# Patient Record
Sex: Female | Born: 1935 | Race: Black or African American | Hispanic: No | Smoking: Current every day smoker
Health system: Southern US, Community
[De-identification: ages and names within clinical notes are randomized; demographics above are authoritative.]

## PROBLEM LIST (undated history)

## (undated) DIAGNOSIS — I10 Essential (primary) hypertension: Secondary | ICD-10-CM

## (undated) DIAGNOSIS — F1910 Other psychoactive substance abuse, uncomplicated: Secondary | ICD-10-CM

## (undated) DIAGNOSIS — G629 Polyneuropathy, unspecified: Secondary | ICD-10-CM

## (undated) DIAGNOSIS — J449 Chronic obstructive pulmonary disease, unspecified: Secondary | ICD-10-CM

## (undated) DIAGNOSIS — D759 Disease of blood and blood-forming organs, unspecified: Secondary | ICD-10-CM

## (undated) DIAGNOSIS — IMO0001 Reserved for inherently not codable concepts without codable children: Secondary | ICD-10-CM

## (undated) DIAGNOSIS — Z5189 Encounter for other specified aftercare: Secondary | ICD-10-CM

## (undated) DIAGNOSIS — Z9289 Personal history of other medical treatment: Secondary | ICD-10-CM

## (undated) DIAGNOSIS — F0390 Unspecified dementia without behavioral disturbance: Secondary | ICD-10-CM

## (undated) DIAGNOSIS — R011 Cardiac murmur, unspecified: Secondary | ICD-10-CM

## (undated) DIAGNOSIS — E039 Hypothyroidism, unspecified: Secondary | ICD-10-CM

## (undated) DIAGNOSIS — N390 Urinary tract infection, site not specified: Secondary | ICD-10-CM

## (undated) DIAGNOSIS — C029 Malignant neoplasm of tongue, unspecified: Secondary | ICD-10-CM

## (undated) DIAGNOSIS — I071 Rheumatic tricuspid insufficiency: Secondary | ICD-10-CM

## (undated) HISTORY — PX: ABDOMINAL HYSTERECTOMY: SHX81

## (undated) HISTORY — PX: COLONOSCOPY: SHX5424

## (undated) HISTORY — PX: EYE SURGERY: SHX253

## (undated) HISTORY — DX: Other psychoactive substance abuse, uncomplicated: F19.10

## (undated) HISTORY — PX: TONSILLECTOMY AND ADENOIDECTOMY: SUR1326

## (undated) HISTORY — PX: BREAST SURGERY: SHX581

---

## 2000-08-11 ENCOUNTER — Ambulatory Visit (HOSPITAL_COMMUNITY): Admission: RE | Admit: 2000-08-11 | Discharge: 2000-08-11 | Payer: Self-pay | Admitting: Cardiology

## 2000-08-11 ENCOUNTER — Encounter: Payer: Self-pay | Admitting: Cardiology

## 2000-08-13 ENCOUNTER — Encounter: Payer: Self-pay | Admitting: Cardiology

## 2000-08-13 ENCOUNTER — Encounter: Admission: RE | Admit: 2000-08-13 | Discharge: 2000-08-13 | Payer: Self-pay | Admitting: Cardiology

## 2000-10-26 ENCOUNTER — Encounter (INDEPENDENT_AMBULATORY_CARE_PROVIDER_SITE_OTHER): Payer: Self-pay

## 2000-10-26 ENCOUNTER — Other Ambulatory Visit: Admission: RE | Admit: 2000-10-26 | Discharge: 2000-10-26 | Payer: Self-pay | Admitting: Internal Medicine

## 2000-10-27 ENCOUNTER — Encounter: Payer: Self-pay | Admitting: Internal Medicine

## 2000-10-27 ENCOUNTER — Ambulatory Visit (HOSPITAL_COMMUNITY): Admission: RE | Admit: 2000-10-27 | Discharge: 2000-10-27 | Payer: Self-pay | Admitting: Internal Medicine

## 2003-01-29 ENCOUNTER — Encounter: Admission: RE | Admit: 2003-01-29 | Discharge: 2003-01-29 | Payer: Self-pay | Admitting: Cardiology

## 2003-01-29 ENCOUNTER — Encounter: Payer: Self-pay | Admitting: Cardiology

## 2003-02-02 ENCOUNTER — Encounter: Payer: Self-pay | Admitting: Cardiology

## 2003-02-02 ENCOUNTER — Encounter: Admission: RE | Admit: 2003-02-02 | Discharge: 2003-02-02 | Payer: Self-pay | Admitting: Cardiology

## 2004-01-14 ENCOUNTER — Ambulatory Visit (HOSPITAL_COMMUNITY): Admission: RE | Admit: 2004-01-14 | Discharge: 2004-01-14 | Payer: Self-pay | Admitting: Internal Medicine

## 2004-01-22 ENCOUNTER — Encounter (INDEPENDENT_AMBULATORY_CARE_PROVIDER_SITE_OTHER): Payer: Self-pay | Admitting: Specialist

## 2004-01-22 ENCOUNTER — Ambulatory Visit (HOSPITAL_COMMUNITY): Admission: RE | Admit: 2004-01-22 | Discharge: 2004-01-22 | Payer: Self-pay | Admitting: Internal Medicine

## 2004-05-08 ENCOUNTER — Encounter: Admission: RE | Admit: 2004-05-08 | Discharge: 2004-05-08 | Payer: Self-pay | Admitting: Cardiology

## 2004-07-22 ENCOUNTER — Encounter: Admission: RE | Admit: 2004-07-22 | Discharge: 2004-07-22 | Payer: Self-pay | Admitting: Cardiology

## 2005-06-19 ENCOUNTER — Encounter: Admission: RE | Admit: 2005-06-19 | Discharge: 2005-06-19 | Payer: Self-pay | Admitting: Cardiology

## 2005-10-02 ENCOUNTER — Ambulatory Visit (HOSPITAL_COMMUNITY): Admission: RE | Admit: 2005-10-02 | Discharge: 2005-10-02 | Payer: Self-pay | Admitting: Internal Medicine

## 2005-10-06 ENCOUNTER — Ambulatory Visit (HOSPITAL_COMMUNITY): Admission: RE | Admit: 2005-10-06 | Discharge: 2005-10-06 | Payer: Self-pay | Admitting: Cardiology

## 2005-10-06 ENCOUNTER — Encounter (INDEPENDENT_AMBULATORY_CARE_PROVIDER_SITE_OTHER): Payer: Self-pay | Admitting: Cardiology

## 2005-12-10 ENCOUNTER — Ambulatory Visit (HOSPITAL_COMMUNITY): Admission: RE | Admit: 2005-12-10 | Discharge: 2005-12-10 | Payer: Self-pay | Admitting: Internal Medicine

## 2007-04-22 ENCOUNTER — Ambulatory Visit (HOSPITAL_COMMUNITY): Admission: RE | Admit: 2007-04-22 | Discharge: 2007-04-22 | Payer: Self-pay | Admitting: Cardiology

## 2008-02-13 ENCOUNTER — Encounter: Admission: RE | Admit: 2008-02-13 | Discharge: 2008-02-13 | Payer: Self-pay | Admitting: Cardiology

## 2008-05-28 ENCOUNTER — Ambulatory Visit (HOSPITAL_COMMUNITY): Admission: RE | Admit: 2008-05-28 | Discharge: 2008-05-28 | Payer: Self-pay | Admitting: Internal Medicine

## 2008-11-26 ENCOUNTER — Encounter: Admission: RE | Admit: 2008-11-26 | Discharge: 2008-11-26 | Payer: Self-pay | Admitting: Cardiology

## 2010-02-07 ENCOUNTER — Emergency Department (HOSPITAL_COMMUNITY): Admission: EM | Admit: 2010-02-07 | Discharge: 2010-02-07 | Payer: Self-pay | Admitting: Emergency Medicine

## 2010-06-22 DIAGNOSIS — I071 Rheumatic tricuspid insufficiency: Secondary | ICD-10-CM

## 2010-06-22 HISTORY — DX: Rheumatic tricuspid insufficiency: I07.1

## 2010-07-13 ENCOUNTER — Encounter: Payer: Self-pay | Admitting: Cardiology

## 2010-09-05 LAB — DIFFERENTIAL
Basophils Absolute: 0 10*3/uL (ref 0.0–0.1)
Basophils Relative: 1 % (ref 0–1)
Eosinophils Relative: 0 % (ref 0–5)
Monocytes Absolute: 0.4 10*3/uL (ref 0.1–1.0)
Neutrophils Relative %: 53 % (ref 43–77)

## 2010-09-05 LAB — BASIC METABOLIC PANEL
CO2: 28 mEq/L (ref 19–32)
Calcium: 8.8 mg/dL (ref 8.4–10.5)
Chloride: 101 mEq/L (ref 96–112)
GFR calc Af Amer: >60 mL/min (ref >60)
GFR calc non Af Amer: >60 mL/min (ref >60)
Potassium: 2.9 mEq/L — ABNORMAL LOW (ref 3.5–5.1)
Sodium: 146 mEq/L — ABNORMAL HIGH (ref 135–145)

## 2010-09-05 LAB — CBC
HCT: 38.4 % (ref 36.0–46.0)
Hemoglobin: 13.2 g/dL (ref 12.0–15.0)
MCH: 36.2 pg — ABNORMAL HIGH (ref 26.0–34.0)
MCV: 105.4 fL — ABNORMAL HIGH (ref 78.0–100.0)
Platelets: 174 10*3/uL (ref 150–400)
WBC: 6.5 10*3/uL (ref 4.0–10.5)

## 2010-09-05 LAB — URINALYSIS, ROUTINE W REFLEX MICROSCOPIC
Nitrite: POSITIVE — AB
Protein, ur: 30 mg/dL — AB
pH: 6 (ref 5.0–8.0)

## 2010-09-05 LAB — POCT CARDIAC MARKERS
CKMB, poc: 2.4 ng/mL (ref 1.0–8.0)
Myoglobin, poc: 174 ng/mL (ref 12–200)
Troponin i, poc: <0.05 ng/mL (ref 0.00–0.09)

## 2010-09-05 LAB — URINE MICROSCOPIC-ADD ON

## 2010-10-07 ENCOUNTER — Emergency Department (HOSPITAL_COMMUNITY)
Admission: EM | Admit: 2010-10-07 | Discharge: 2010-10-07 | Disposition: A | Discharge status: Home / Self Care | Payer: Medicare Other | Attending: Emergency Medicine | Admitting: Emergency Medicine

## 2010-10-07 ENCOUNTER — Emergency Department (HOSPITAL_COMMUNITY): Payer: Medicare Other

## 2010-10-07 DIAGNOSIS — J449 Chronic obstructive pulmonary disease, unspecified: Secondary | ICD-10-CM | POA: Insufficient documentation

## 2010-10-07 DIAGNOSIS — E119 Type 2 diabetes mellitus without complications: Secondary | ICD-10-CM | POA: Insufficient documentation

## 2010-10-07 DIAGNOSIS — E7800 Pure hypercholesterolemia, unspecified: Secondary | ICD-10-CM | POA: Insufficient documentation

## 2010-10-07 DIAGNOSIS — R0602 Shortness of breath: Secondary | ICD-10-CM | POA: Insufficient documentation

## 2010-10-07 DIAGNOSIS — R05 Cough: Secondary | ICD-10-CM | POA: Insufficient documentation

## 2010-10-07 DIAGNOSIS — E039 Hypothyroidism, unspecified: Secondary | ICD-10-CM | POA: Insufficient documentation

## 2010-10-07 DIAGNOSIS — R0989 Other specified symptoms and signs involving the circulatory and respiratory systems: Secondary | ICD-10-CM | POA: Insufficient documentation

## 2010-10-07 DIAGNOSIS — J4489 Other specified chronic obstructive pulmonary disease: Secondary | ICD-10-CM | POA: Insufficient documentation

## 2010-10-07 DIAGNOSIS — R0902 Hypoxemia: Secondary | ICD-10-CM | POA: Insufficient documentation

## 2010-10-07 DIAGNOSIS — R059 Cough, unspecified: Secondary | ICD-10-CM | POA: Insufficient documentation

## 2010-10-07 DIAGNOSIS — I10 Essential (primary) hypertension: Secondary | ICD-10-CM | POA: Insufficient documentation

## 2010-11-07 ENCOUNTER — Inpatient Hospital Stay (HOSPITAL_COMMUNITY)
Admission: AD | Admit: 2010-11-07 | Discharge: 2010-11-10 | DRG: 392 | Disposition: A | Discharge status: Home / Self Care | Payer: Medicare Other | Source: Ambulatory Visit | Attending: Internal Medicine | Admitting: Internal Medicine

## 2010-11-07 DIAGNOSIS — N179 Acute kidney failure, unspecified: Secondary | ICD-10-CM | POA: Diagnosis present

## 2010-11-07 DIAGNOSIS — F17200 Nicotine dependence, unspecified, uncomplicated: Secondary | ICD-10-CM | POA: Diagnosis present

## 2010-11-07 DIAGNOSIS — E039 Hypothyroidism, unspecified: Secondary | ICD-10-CM | POA: Diagnosis present

## 2010-11-07 DIAGNOSIS — E876 Hypokalemia: Secondary | ICD-10-CM | POA: Diagnosis present

## 2010-11-07 DIAGNOSIS — K219 Gastro-esophageal reflux disease without esophagitis: Secondary | ICD-10-CM | POA: Diagnosis present

## 2010-11-07 DIAGNOSIS — M1990 Unspecified osteoarthritis, unspecified site: Secondary | ICD-10-CM | POA: Diagnosis present

## 2010-11-07 DIAGNOSIS — I10 Essential (primary) hypertension: Secondary | ICD-10-CM | POA: Diagnosis present

## 2010-11-07 DIAGNOSIS — R5381 Other malaise: Secondary | ICD-10-CM | POA: Diagnosis present

## 2010-11-07 DIAGNOSIS — E119 Type 2 diabetes mellitus without complications: Secondary | ICD-10-CM | POA: Diagnosis present

## 2010-11-07 DIAGNOSIS — M810 Age-related osteoporosis without current pathological fracture: Secondary | ICD-10-CM | POA: Diagnosis present

## 2010-11-07 DIAGNOSIS — R627 Adult failure to thrive: Secondary | ICD-10-CM | POA: Diagnosis present

## 2010-11-07 DIAGNOSIS — K224 Dyskinesia of esophagus: Principal | ICD-10-CM | POA: Diagnosis present

## 2010-11-07 LAB — DIFFERENTIAL
Eosinophils Relative: 0 % (ref 0–5)
Lymphs Abs: 1.9 10*3/uL (ref 0.7–4.0)
Monocytes Absolute: 0.5 10*3/uL (ref 0.1–1.0)
Neutro Abs: 4.9 10*3/uL (ref 1.7–7.7)

## 2010-11-07 LAB — CBC
RBC: 3.67 MIL/uL — ABNORMAL LOW (ref 3.87–5.11)
RDW: 12.6 % (ref 11.5–15.5)

## 2010-11-07 LAB — LIPID PANEL
Cholesterol: 166 mg/dL (ref 0–200)
HDL: 40 mg/dL (ref >39)
LDL Cholesterol: 100 mg/dL — ABNORMAL HIGH (ref 0–99)
Total CHOL/HDL Ratio: 4.2 RATIO
Triglycerides: 131 mg/dL (ref <150)

## 2010-11-07 LAB — CARDIAC PANEL(CRET KIN+CKTOT+MB+TROPI)
CK, MB: 1.8 ng/mL (ref 0.3–4.0)
Troponin I: <0.3 ng/mL (ref <0.30)

## 2010-11-07 LAB — GLUCOSE, CAPILLARY

## 2010-11-08 ENCOUNTER — Inpatient Hospital Stay (HOSPITAL_COMMUNITY): Payer: Medicare Other

## 2010-11-08 DIAGNOSIS — R1310 Dysphagia, unspecified: Secondary | ICD-10-CM

## 2010-11-08 LAB — CARDIAC PANEL(CRET KIN+CKTOT+MB+TROPI)
CK, MB: 1.6 ng/mL (ref 0.3–4.0)
Relative Index: INVALID (ref 0.0–2.5)
Total CK: 45 U/L (ref 7–177)

## 2010-11-08 LAB — COMPREHENSIVE METABOLIC PANEL
ALT: 31 U/L (ref 0–35)
ALT: 26 U/L (ref 0–35)
AST: 71 U/L — ABNORMAL HIGH (ref 0–37)
Albumin: 3.3 g/dL — ABNORMAL LOW (ref 3.5–5.2)
Albumin: 2.6 g/dL — ABNORMAL LOW (ref 3.5–5.2)
Alkaline Phosphatase: 88 U/L (ref 39–117)
Alkaline Phosphatase: 73 U/L (ref 39–117)
BUN: 35 mg/dL — ABNORMAL HIGH (ref 6–23)
CO2: 31 mEq/L (ref 19–32)
Calcium: 8.2 mg/dL — ABNORMAL LOW (ref 8.4–10.5)
Creatinine, Ser: 1.38 mg/dL — ABNORMAL HIGH (ref 0.4–1.2)
GFR calc Af Amer: >60 mL/min (ref >60)
Potassium: 2.5 mEq/L — CL (ref 3.5–5.1)
Potassium: 3.6 mEq/L (ref 3.5–5.1)
Sodium: 134 mEq/L — ABNORMAL LOW (ref 135–145)
Total Protein: 7.2 g/dL (ref 6.0–8.3)
Total Protein: 5.9 g/dL — ABNORMAL LOW (ref 6.0–8.3)

## 2010-11-08 LAB — CBC
HCT: 33.3 % — ABNORMAL LOW (ref 36.0–46.0)
Hemoglobin: 12.1 g/dL (ref 12.0–15.0)
MCV: 94.6 fL (ref 78.0–100.0)
RBC: 3.52 MIL/uL — ABNORMAL LOW (ref 3.87–5.11)
WBC: 6.5 10*3/uL (ref 4.0–10.5)

## 2010-11-08 LAB — TSH: TSH: 12.174 u[IU]/mL — ABNORMAL HIGH (ref 0.350–4.500)

## 2010-11-08 LAB — GLUCOSE, CAPILLARY: Glucose-Capillary: 212 mg/dL — ABNORMAL HIGH (ref 70–99)

## 2010-11-09 ENCOUNTER — Inpatient Hospital Stay (HOSPITAL_COMMUNITY): Payer: Medicare Other

## 2010-11-09 DIAGNOSIS — R1310 Dysphagia, unspecified: Secondary | ICD-10-CM

## 2010-11-09 LAB — CBC
HCT: 32.3 % — ABNORMAL LOW (ref 36.0–46.0)
MCV: 94.7 fL (ref 78.0–100.0)
Platelets: 124 10*3/uL — ABNORMAL LOW (ref 150–400)
RBC: 3.41 MIL/uL — ABNORMAL LOW (ref 3.87–5.11)
WBC: 5.1 10*3/uL (ref 4.0–10.5)

## 2010-11-09 LAB — GLUCOSE, CAPILLARY: Glucose-Capillary: 198 mg/dL — ABNORMAL HIGH (ref 70–99)

## 2010-11-09 LAB — BASIC METABOLIC PANEL
Calcium: 7.6 mg/dL — ABNORMAL LOW (ref 8.4–10.5)
GFR calc Af Amer: >60 mL/min (ref >60)
GFR calc non Af Amer: >60 mL/min (ref >60)
Glucose, Bld: 206 mg/dL — ABNORMAL HIGH (ref 70–99)
Potassium: 3.4 mEq/L — ABNORMAL LOW (ref 3.5–5.1)
Sodium: 140 mEq/L (ref 135–145)

## 2010-11-09 LAB — T3, FREE: T3, Free: 2.4 pg/mL (ref 2.3–4.2)

## 2010-11-09 LAB — T4, FREE: Free T4: 0.89 ng/dL (ref 0.80–1.80)

## 2010-11-10 ENCOUNTER — Inpatient Hospital Stay (HOSPITAL_COMMUNITY): Payer: Medicare Other

## 2010-11-17 NOTE — Discharge Summary (Signed)
[name], [name]            ACCOUNT NO.:  [id number]  MEDICAL RECORD NO.:  [id number]           PATIENT TYPE:  I  LOCATION:  6705                         FACILITY:  [facility]  PHYSICIAN:  Brendia Sacks, MD    DATE OF BIRTH:  Nov 11, 1933  DATE OF ADMISSION:  11/07/2010 DATE OF DISCHARGE:  11/10/2010                              DISCHARGE SUMMARY   CONDITION ON DISCHARGE:  Improved.  PRIMARY CARE PHYSICIAN:  Dr. Clent Ridges.  DISCHARGE DIAGNOSES: 1. Dysphagia, solid and liquid. 2. Esophageal spasm. 3. Recurrent falls with negative evaluation. 4. Acute renal failure secondary to poor oral intake, resolved. 5. Hypothyroidism, stable. 6. Diabetes mellitus type 2, stable. 7. Gastroesophageal reflux disease, stable.  HISTORY OF PRESENT ILLNESS:  A 77 year old woman with a direct admission from Dr. Claris Che office for failure to thrive.  HOSPITAL COURSE: 1. Solid and liquid food dysphagia.  The patient was admitted to the     medical floor and seen in consultation with Gastroenterology.  She     was noted to have had an abnormal esophagus on an upper endoscopy     done by Dr. Elnoria Howard approximately a year ago and at that time per the     records I see here she was noted to have esophageal stricture,     hiatal hernia and a tortuous esophagus.  Gastroenterology on call     over the weekend recommended an esophagram before proceeding with     upper endoscopy.  This esophagram did indeed show severe esophageal     spasm but no fixed esophageal structures or masses.  Very mild     esophageal dysmotility.  The patient is tolerating diet now.  She     insists on going home.  She did have some drooling over the last 4     weeks but this has completely resolved.  I have discussed the case     with Dr. Elnoria Howard who will follow her up in the office in approximately     a week and consider further evaluation at that time.  His     recommendation is PPI therapy.  The patient by history had been  offered Nexium for some time.  I have told her to resume this at     this point. 2. Recurrent falls.  The patient had had some falls at home.  She was     evaluated by Physical Therapy and home health was recommended.  She     notes that she does have a walker at home.  She was referred for     home health physical therapy, however, she adamantly declined this.     MRI of the brain was negative.  No further evaluation is indicated     at this point. 3. Acute renal failure.  Probably secondary to poor oral intake.  This     resolved with supportive care and IV fluids. 4. Hypothyroidism.  TSH was elevated, however, the patient had been     noncompliant with her medication.  She just recently restarted it. 5. Diabetes mellitus type 2.  This has remained stable during this  hospitalization.  The patient takes Starlix and will resume that at     home.  PLAN:  As above.  CONSULTATION:  Gastroenterology recommendations as above.  PROCEDURES:  None.  IMAGING: 1. Chest x-ray October 07, 2010:  No evidence of acute pulmonary disease by shallow inspiration. 2. MRI of the brain Nov 08, 2010:  Motion degraded exam.  Atrophy and     small-vessel disease.  No definite acute intracranial findings     seen. 3. Esophagram Nov 10, 2010:  Severe esophageal spasm.  No fixed     esophageal strictures or masses.  Small hiatal hernia.  Very mild     esophageal dysmotility.  Spasm of the cricopharyngeus muscle.     Trace laryngeal penetration without evidence of tracheal     aspiration.  PERTINENT LABORATORY STUDIES: 1. TSH was 12.174.  Note the patient has been noncompliant with her     medication. 2. Hemoglobin A1c 7.8. 3. CBC notable for hemoglobin stable at 11.2, platelet count mildly     depressed at 124 and stable. 4. Capillary blood sugars fairly well-controlled. 5. Basic metabolic panel notable for creatinine 1.38 on admission,     potassium 2.5, and BUN of 35.  On discharge, BUN and  creatinine     have normalized. 6. Cardiac enzymes were negative.  PHYSICAL EXAMINATION:  GENERAL:  On discharge, the patient is feeling well, tolerating a diet.  She is absolutely adamant to go home.  She has had no drooling and is eating well. VITAL SIGNS:  Temperature is 98.2, pulse 69, respirations 18, blood pressure 131/80, sat 100% on room air. CARDIOVASCULAR:  Regular rate and rhythm.  No murmur, rub or gallop. RESPIRATORY:  Clear to auscultation bilaterally.  No wheezes, rales or rhonchi.  Normal respiratory effort.  DISCHARGE INSTRUCTIONS:  The patient will be discharged home today. Diet would recommend a soft, heart-healthy diabetic diet.  Activity use a walker.  Recommend home health physical therapy, however, the patient declines this.  Follow up with Dr. Clent Ridges in about 2 weeks and with Dr. Elnoria Howard in 1-2 weeks.  DISCHARGE MEDICATIONS: 1. Nexium 40 mg p.o. daily. 2. Starlix 120 mg p.o. t.i.d. take before meals. 3. Synthroid 75 mcg p.o. daily.  Note that the patient has been told by Dr. Clent Ridges to hold her blood pressure medications at this time.  Time coordinating discharge 35 minutes.     Brendia Sacks, MD    DG/MEDQ  D:  11/10/2010  T:  11/11/2010  Job:  [id number]  cc:   __________Dr. Clent Ridges  Electronically Signed by Brendia Sacks  on 11/17/2010 04:38:42 PM

## 2010-11-20 NOTE — H&P (Signed)
[name], [name]            ACCOUNT NO.:  [id number]  MEDICAL RECORD NO.:  [id number]           PATIENT TYPE:  I  LOCATION:  5531                         FACILITY:  [facility]  PHYSICIAN:  Lonia Blood, M.D.      DATE OF BIRTH:  27-Jan-1934  DATE OF ADMISSION:  11/07/2010 DATE OF DISCHARGE:                             HISTORY & PHYSICAL   PRIMARY CARE PHYSICIAN:  Dr. Clent Ridges  PRESENTING COMPLAINT:  Dysphagia and failure to thrive.  HISTORY OF PRESENT ILLNESS:  The patient is a 77 year old female with history of diet-controlled diabetes, hypertension, and osteoporosis among other things who had an EGD about a year ago by Dr. Jeani Hawking. She had the EGD due to GERD and apparently was normal.  The patient has been having problems since early this year after she had a fall.  She apparently in the last 3 weeks has had problems with dysphagia.  She also had associated nausea.  She denied any fever or chills.  Denied any diarrhea or constipation.  She denied any abdominal pain.  The patient has, however, felt to thrive, and I feel like she is dehydrated.  Her skin has been peeling, mainly in her palms.  She went to see Dr. Clent Ridges today who admitted her directly to the floor for workup of this dysphagia.  The patient's daughter is by her side, and she is primary historian.  PAST MEDICAL HISTORY:  Significant for: 1. 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  She is currently     hypothyroid. 2. History of osteoporosis. 3. Hypertension. 4. Diet-controlled diabetes. 5. GERD. 6. Recent skin peeling. 7. The patient also had some falls at least twice recently.  ALLERGIES:  She has no known drug allergies.  MEDICATIONS:  The patient has been taken off all medicines except for Synthroid 75 mcg p.o. daily.  SOCIAL HISTORY:  She lives alone at home.  She is a former retired Engineer, civil (consulting).  She still smokes about one pack per day and denied any alcohol or IV drug use.  FAMILY HISTORY:  Mainly  hypertension and some diabetes.  REVIEW OF SYSTEMS:  All systems reviewed are negative except per HPI.  PHYSICAL EXAMINATION:  VITAL SIGNS:  Her temperature is 98.2, blood pressure 131/92, pulse 85, respiratory rate 16, sats 97% on room air. GENERAL:  She is awake, alert, oriented.  She is in no acute distress. Pleasant, calm, communicating well. HEENT:  PERRL.  EOMI.  No pallor, no jaundice.  No rhinorrhea. NECK:  Supple.  No JVD, no lymphadenopathy. RESPIRATORY:  She has good air entry bilaterally.  No wheezes, no rales, no crackles. CARDIOVASCULAR:  She has S1 and S2.  No audible murmur. ABDOMEN:  Soft, full, nontender with positive bowel sounds. EXTREMITIES:  No edema, cyanosis, or clubbing. SKIN:  No rashes.  No ulcers. MUSCULOSKELETAL:  The patient has slight pain in the middle of her back and tender.  Otherwise, no significant joint inflammation, color change, or tenderness. NEUROLOGIC:  Cranial nerves II through XII seemed to be intact.  No focal neurologic deficits.  LABORATORY DATA:  Currently pending.  X-rays are also pending at this point.  ASSESSMENT:  This is a 77 year old female presenting with what appears to be failing to thrive at home secondary to dysphagia.  The patient's last esophagogastroduodenoscopy was apparently almost a year ago by Dr. Elnoria Howard.  She probably needs to have a repeat.  She also had a colonoscopy at that time.  I suspect esophageal dysmotility or some type of esophageal stricture due to her gastroesophageal reflux disease.  She may need to have that place dilated.  The plan therefore: 1. Failure to thrive.  We will address the underlying cause.  Check     her labs at this point and look her albumin specifically and we     will see if the patient may need to have any further workup.  In     the meantime, we will hydrate her effectively. 2. Dysphagia.  Again, we will get GI involved.  I will put her on     clear liquids and then gradually on  dysphagia diet. 3. Diet-controlled diabetes.  We will check hemoglobin A1c among other     things and check her blood sugars q.a.c. and nightly and sliding     scale insulin. 4. Hypertension.  Blood pressure is reasonably controlled at this     point although she has a history of hypertension. 5. GERD.  I will start PPIs until she is seen by GI. 6. Hypothyroidism.  I will check her TSH and free T4.  Continue with     Synthroid.  Further treatment will all depend on what her labs     show. 7. Skin peeling.  The palms and her soles are peeling.  There are     multiple differentials but I suspect acute medical issues including     untreated hypothyroidism.  We will address that and I will     encourage the patient to use emollients for her hands.     Lonia Blood, M.D.     Verlin Grills  D:  11/07/2010  T:  11/07/2010  Job:  [id number]  Electronically Signed by Lonia Blood M.D. on 11/20/2010 11:48:35 AM

## 2011-01-30 ENCOUNTER — Inpatient Hospital Stay (HOSPITAL_COMMUNITY)
Admission: AD | Admit: 2011-01-30 | Discharge: 2011-02-02 | DRG: 433 | Disposition: A | Discharge status: Home / Self Care | Payer: Medicare Other | Source: Ambulatory Visit | Attending: Internal Medicine | Admitting: Internal Medicine

## 2011-01-30 DIAGNOSIS — M810 Age-related osteoporosis without current pathological fracture: Secondary | ICD-10-CM | POA: Diagnosis present

## 2011-01-30 DIAGNOSIS — E039 Hypothyroidism, unspecified: Secondary | ICD-10-CM | POA: Diagnosis present

## 2011-01-30 DIAGNOSIS — Z79899 Other long term (current) drug therapy: Secondary | ICD-10-CM

## 2011-01-30 DIAGNOSIS — E876 Hypokalemia: Secondary | ICD-10-CM | POA: Diagnosis present

## 2011-01-30 DIAGNOSIS — IMO0001 Reserved for inherently not codable concepts without codable children: Secondary | ICD-10-CM | POA: Diagnosis present

## 2011-01-30 DIAGNOSIS — R1310 Dysphagia, unspecified: Secondary | ICD-10-CM | POA: Diagnosis present

## 2011-01-30 DIAGNOSIS — E871 Hypo-osmolality and hyponatremia: Secondary | ICD-10-CM | POA: Diagnosis present

## 2011-01-30 DIAGNOSIS — K219 Gastro-esophageal reflux disease without esophagitis: Secondary | ICD-10-CM | POA: Diagnosis present

## 2011-01-30 DIAGNOSIS — R4189 Other symptoms and signs involving cognitive functions and awareness: Secondary | ICD-10-CM | POA: Diagnosis present

## 2011-01-30 DIAGNOSIS — K7010 Alcoholic hepatitis without ascites: Principal | ICD-10-CM | POA: Diagnosis present

## 2011-01-30 DIAGNOSIS — F1020 Alcohol dependence, uncomplicated: Secondary | ICD-10-CM | POA: Diagnosis present

## 2011-01-30 DIAGNOSIS — N179 Acute kidney failure, unspecified: Secondary | ICD-10-CM | POA: Diagnosis present

## 2011-01-31 ENCOUNTER — Encounter (HOSPITAL_COMMUNITY): Payer: Self-pay | Admitting: Radiology

## 2011-01-31 ENCOUNTER — Inpatient Hospital Stay (HOSPITAL_COMMUNITY): Payer: Medicare Other

## 2011-01-31 LAB — COMPREHENSIVE METABOLIC PANEL
ALT: 44 U/L — ABNORMAL HIGH (ref 0–35)
AST: 102 U/L — ABNORMAL HIGH (ref 0–37)
Calcium: 9.5 mg/dL (ref 8.4–10.5)
GFR calc Af Amer: 36 mL/min — ABNORMAL LOW (ref >60)
Glucose, Bld: 396 mg/dL — ABNORMAL HIGH (ref 70–99)
Sodium: 128 mEq/L — ABNORMAL LOW (ref 135–145)
Total Protein: 6.9 g/dL (ref 6.0–8.3)

## 2011-01-31 LAB — CBC
Hemoglobin: 11.7 g/dL — ABNORMAL LOW (ref 12.0–15.0)
MCH: 34.4 pg — ABNORMAL HIGH (ref 26.0–34.0)
MCV: 97.9 fL (ref 78.0–100.0)
RBC: 3.4 MIL/uL — ABNORMAL LOW (ref 3.87–5.11)

## 2011-01-31 LAB — URINALYSIS, ROUTINE W REFLEX MICROSCOPIC
Glucose, UA: 500 mg/dL — AB
Ketones, ur: 15 mg/dL — AB
Specific Gravity, Urine: 1.025 (ref 1.005–1.030)
pH: 5 (ref 5.0–8.0)

## 2011-01-31 LAB — RAPID URINE DRUG SCREEN, HOSP PERFORMED: Benzodiazepines: NOT DETECTED

## 2011-01-31 LAB — CARDIAC PANEL(CRET KIN+CKTOT+MB+TROPI)
CK, MB: 2.5 ng/mL (ref 0.3–4.0)
CK, MB: 2.4 ng/mL (ref 0.3–4.0)
Relative Index: INVALID (ref 0.0–2.5)
Relative Index: INVALID (ref 0.0–2.5)
Total CK: 50 U/L (ref 7–177)
Troponin I: <0.3 ng/mL (ref <0.30)

## 2011-01-31 LAB — URINE MICROSCOPIC-ADD ON

## 2011-01-31 LAB — DIFFERENTIAL
Lymphs Abs: 2.1 10*3/uL (ref 0.7–4.0)
Monocytes Relative: 11 % (ref 3–12)
Neutro Abs: 2.7 10*3/uL (ref 1.7–7.7)
Neutrophils Relative %: 49 % (ref 43–77)

## 2011-01-31 LAB — GLUCOSE, CAPILLARY
Glucose-Capillary: 120 mg/dL — ABNORMAL HIGH (ref 70–99)
Glucose-Capillary: 80 mg/dL (ref 70–99)

## 2011-01-31 LAB — HEMOGLOBIN A1C: Mean Plasma Glucose: 143 mg/dL — ABNORMAL HIGH (ref <117)

## 2011-01-31 MED ORDER — TECHNETIUM TC 99M MEBROFENIN IV KIT
5.0000 | PACK | Freq: Once | INTRAVENOUS | Status: AC | PRN
  Administered 2011-01-31: 5 via INTRAVENOUS

## 2011-01-31 NOTE — H&P (Signed)
[name], [name]            ACCOUNT NO.:  [id number]  MEDICAL RECORD NO.:  [id number]  LOCATION:  3014                         FACILITY:  [facility]  PHYSICIAN:  Tarry Kos, MD       DATE OF BIRTH:  01-Nov-1933  DATE OF ADMISSION:  01/30/2011 DATE OF DISCHARGE:                             HISTORY & PHYSICAL   CHIEF COMPLAINT:  Weakness, nausea, vomiting, UTI.  HISTORY OF PRESENT ILLNESS:  [name] is a pleasant 77 year old African American female who has a history of hypothyroidism, well controlled diabetes, and nausea, vomiting that has been waxing and waning, but sounds like for several months now.  She presented to her primary care physician's office and some laboratory values were done. She was found to be dehydrated and renal failure with a creatinine of 1.7, previous creatinine had been 0.6.  Her potassium level was reported to be normal and her urinalysis was reported to show signs of a UTI.  We are asked to be directly admit the patient because of the need for IV fluids and to monitor her renal function closely.  [name] says that she has noticed that her urine has been foul-smelling and is darker in color.  She has also been having nausea and vomiting.  She actually has had nausea and vomiting for several months now, but it is sort of had gotten better for a period after her last discharge which was on Nov 11, 2010.  She was admitted at that time with dysphasia, esophageal spasm, and had a GI workup by Dr. Elnoria Howard, she is being followed by Dr. Elnoria Howard as an outpatient.  At some period where she had no nausea and vomiting, she has a lot of burping and then over the last week, has had more nausea and vomiting in the setting of no abdominal pain.  She really cannot tell me if it is related to food.  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  Her daughter is present with her right now who is a pediatrician in the [place] area and she says that her vomiting seems to have gotten worse over the last week, but really is a question of how reliable the history is from [name] as far as the details of her vomiting.  She does deny abdominal pain.  She is experiencing some worsening back pain that is probably related to her bladder infection and possible early pyelonephritis.  Other than that she says that she is eating and drinking okay.  She has not had any recent medication changes.  She still has her gallbladder.  PAST MEDICAL HISTORY: 1. Hospitalization on Nov 07, 2010, because of failure to thrive and     dysphasia. 2. She had a EGD within the last year due to GERD which was reported     as normal and reported tortuous esophagus. 3. Hypothyroidism. 4. Osteoporosis. 5. Hypertension. 6. Diabetes, non-insulin-dependent. 7. History of esophageal spasm. 8. History of acute renal failure due to poor dehydration in  the past.  MEDICATIONS:  She takes Starlix which she occasionally does not take. She also was supposed to be taking Synthroid which she also occasionally does not take and Nexium 40 mg daily.  SOCIAL HISTORY:  She does smoke about a pack a day.  She denies any drinking.  She denies any other drugs.  She lives alone.  She has got 2 living children, one is with her right now who is her daughter who is a Optometrist.  She is a full code.  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  ALLERGIES:  None.  PHYSICAL EXAMINATION:  VITAL SIGNS:  Temperature is 98.3, pulse 90, respirations 20, blood pressure 130/80, 97% O2 sats on room air. GENERAL:  She is alert and oriented x4.  No apparent distress, cooperative and friendly. HEENT:  Extraocular muscles intact.  Pupils equal, round, and reactive to light.  Oropharynx clear.  Mucous membranes moist. NECK:  No JVD, no carotid  bruits. COR:  Regular rate and rhythm without murmurs, rubs, or gallops. CHEST:  Clear to auscultation bilaterally.  No wheezes, rhonchi, or rales. ABDOMEN:  Soft, nontender, nondistended.  Positive bowel sounds.  No hepatomegaly, nonacute abdomen.  No CVA tenderness bilaterally. EXTREMITIES:  No clubbing, cyanosis, or edema. PSYCHIATRIC:  Normal mood and affect. NEUROLOGIC:  No focal neurologic deficits. SKIN:  No rashes.  LABS:  Reported labs to me was creatinine 1.7 today.  We do not have the labs available, they were supposed to be faxed from Dr. Claris Che office, we need to find these.  The daughter says she also had thyroid studies checked today.  ASSESSMENT AND PLAN:  This is a 77 year old female with nausea, vomiting, dehydration, urinary tract infection, and acute renal failure. 1. Acute renal failure likely prerenal in nature due to multiple     issues, particularly poor p.o. intake along with her current     urinary tract infection.  We will place her on IV fluids overnight     and also start her on some antibiotics and monitor this closely.     Obtain bilateral renal ultrasound. 2. Urinary tract infection.  I will place her Rocephin, obtain urine     culture. 3. Nausea and vomiting of unclear etiology.  She still has her     gallbladder.  I wonder if she has got chronic issues with her     gallbladder.  I am going to obtain a HIDA scan in the morning.  Her     abdominal exam is benign at this point, but we will also check LFTs     and lipase level and one set of cardiac enzymes. 4. Her daughter pulled me out in the hallway and mentioned that they     were worried that she was drinking more alcohol than what she     admits to.  They are concerned that she actually is depressed and     is drinking more alcohol and she has not been forthcoming with them     which shed more light on her nausea and vomiting issues if this is     the case.  I am going to check a urine drug  screen also and again     check her liver, they do not really know what the extent is of her     alcohol use, but this may need to be readdressed with the patient     when family members  are not around may be she will be more     forthcoming when she does not have any family members in the room     to answer those personal questions.  I am also going to check an     alcohol level. 5. Patient is full code. Further recommendation depending over     hospital course.          ______________________________ Tarry Kos, MD     RD/MEDQ  D:  01/30/2011  T:  01/31/2011  Job:  [id number]  Electronically Signed by Tarry Kos MD on 01/31/2011 05:52:03 PM

## 2011-02-01 LAB — CBC
MCHC: 34.2 g/dL (ref 30.0–36.0)
Platelets: 101 10*3/uL — ABNORMAL LOW (ref 150–400)
RDW: 14.1 % (ref 11.5–15.5)
WBC: 4.4 10*3/uL (ref 4.0–10.5)

## 2011-02-01 LAB — GLUCOSE, CAPILLARY
Glucose-Capillary: 107 mg/dL — ABNORMAL HIGH (ref 70–99)
Glucose-Capillary: 82 mg/dL (ref 70–99)

## 2011-02-01 LAB — BASIC METABOLIC PANEL
CO2: 22 mEq/L (ref 19–32)
Calcium: 8.8 mg/dL (ref 8.4–10.5)
Chloride: 103 mEq/L (ref 96–112)
Creatinine, Ser: 0.74 mg/dL (ref 0.50–1.10)
Glucose, Bld: 105 mg/dL — ABNORMAL HIGH (ref 70–99)

## 2011-02-01 LAB — URINE CULTURE: Culture  Setup Time: 201208111241

## 2011-02-02 DIAGNOSIS — F4320 Adjustment disorder, unspecified: Secondary | ICD-10-CM

## 2011-02-02 LAB — GLUCOSE, CAPILLARY: Glucose-Capillary: 172 mg/dL — ABNORMAL HIGH (ref 70–99)

## 2011-02-03 NOTE — Consult Note (Signed)
  [name], [name]            ACCOUNT NO.:  [id number]  MEDICAL RECORD NO.:  [id number]  LOCATION:  3014                         FACILITY:  [facility]  PHYSICIAN:  Eulogio Ditch, MD DATE OF BIRTH:  August 08, 1933  DATE OF CONSULTATION:  02/02/2011 DATE OF DISCHARGE:                                CONSULTATION   FACILITY:  [facility].  REASON FOR CONSULTATION:  Capacity.  HISTORY OF PRESENT ILLNESS:  [name] is a 77 year old African American female, who was admitted on the medical floor because of the dehydration and acute renal failure.  When I saw the patient with clinical social worker, the patient was pleasant on approach.  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  She told in detail at where she has worked in the past, for example in [facility] [employer] and then in a and t and she retired from it. Throughout the interview, the patient was very pleasant and she was able to tell that why she is in the hospital.  I told her that her creatinine is little high and she told me she know what creatinine exam is, tells you how your kidneys are working.  She said in the elderly people that generally there is UTI, so might dehydration can be the cause of that. She denied any history of being admitted to mental hospital.  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  She denied to be on any psych medication.  She denied any suicidal or homicidal ideations in the past. She said I do not know why my daughter wants to send me to nursing facility, I just want to go home and I can take care of myself.  The patient to the interview was not hallucinating or delusional.  She was logical and goal directed easily redirectable.  Not suicidal or homicidal.  Alert, awake, oriented x3.  Memory, immediate, recent, and remote fair.  Attention concentration fair.  Abstraction ability  fair. Insight and judgment fair.  On asking about her alcohol abuse, she said she never abuse alcohol and alcohol is not an issue for her.  RECOMMENDATIONS:  At this time, the patient have capacity to make her decisions.  Thanks for involving me in taking care of this patient.     Eulogio Ditch, MD     SA/MEDQ  D:  02/02/2011  T:  02/02/2011  Job:  [id number]  Electronically Signed by Eulogio Ditch  on 02/03/2011 09:00:19 AM

## 2011-02-03 NOTE — Discharge Summary (Signed)
[name], [name]            ACCOUNT NO.:  [id number]  MEDICAL RECORD NO.:  [id number]  LOCATION:  3014                         FACILITY:  [facility]  PHYSICIAN:  Lonia Blood, M.D.       DATE OF BIRTH:  06-30-1933  DATE OF ADMISSION:  01/30/2011 DATE OF DISCHARGE:  02/02/2011                              DISCHARGE SUMMARY   PRIMARY CARE PHYSICIAN:  Elby Showers, MD  DISCHARGE DIAGNOSES: 1. Nausea, vomiting, weakness, hypokalemia, hypomagnesemia and mild     acute alcoholic hepatitis with acute renal insufficiency and     dehydration all resolved after discontinuation of alcohol and     intravenous fluids. 2. Gastroesophageal reflux disease. 3. Hypothyroidism. 4. Diabetes mellitus type 2. 5. History of esophageal spasm. 6. Memory deficit, especially in the form of confabulation and memory     retained in retaining of new information - unable to remember 3     objects at 5 minutes repeatedly.  DISCHARGE MEDICATIONS: 1. Folic acid 1 mg daily. 2. Thiamine 100 mg daily. 3. Magnesium chloride 64 mg daily. 4. Nexium 40 mg daily. 5. Potassium chloride 200 mEq daily. 6. Starlix 60 mg daily. 7. Synthroid 75 mcg daily.  CONDITION ON DISCHARGE:  [name] will be discharged home.  She was told to stop drinking alcohol.  She was strongly advised to accept a 24- hour supervision or at least home health help, but she has refused adamantly.  The patient was seen in consultation by Psychiatry and they felt that she has confessed to refuse all these treatments.  The patient's daughter was involved in her care and she does not feel comfortable finding for guardianship with a court.  I have personally told [name] that I am afraid about her safety and I think that she is a very high risk for falls, motor vehicle accident and medication mishaps due to her short-term memory deficits.  She politely informed me that she can take care of her own business at home and she will be  just fine.  She voiced willingness to cut down on her drinking, but she has no desire to stop drinking completely and she as a matter-of-fact does not wish to go any alcohol detoxification.  PROCEDURE THIS ADMISSION: 1. The patient underwent a renal ultrasound which was negative for     hydronephrosis. 2. HIDA scan negative for gallbladder disease.  CONSULTATION:  The patient was seen in consultation by Dr. Rogers Blocker from Psychiatry.  HISTORY AND PHYSICAL:  Refer to the dictated H and P done by Dr. Onalee Hua.  HOSPITAL COURSE:  [name] is a 77 year old woman with history of some gastroesophageal reflux disease, dysphagia, was found to have a creatinine of 1.7 in the primary care physician office.  She was also found to be a little bit unsteady on her feet.  She was admitted to the hospital with possibility of dehydration, acute renal insufficiency and questionable urinary tract infection.  Upon further evaluation, [name] became apparent that she was drinking alcohol heavily.  She has been on this from her family and from her primary care physician.  She had a complete metabolic profile with findings of hyperkalemia, hyponatremia, AST of 102, ALT  of 44 suggestive of alcohol abuse.  Urine culture returned with 9000 colonies insignificant growth.  Her magnesium level returned at 1.4.  We proceeded with intravenous fluids, potassium and magnesium supplementation, discontinuation of alcohol.  The patient also was placed on empiric antibiotics until we got the final results of the urine culture.  She was able to tolerate a regular diet, got stronger every day and by February 02, 2011, she requested to be discharged home to live alone.  On that particular day to have tested her short-term memory, 3 object recollection and she had 0 recollection of these 3 objects at 5 minutes.  I have expressed my concerns to the patient and her family about her short-term memory deficits and  what challenges this imposes on her living alone.  The patient denies any harm to herself or others and I counseled consultant psychiatrist felt that she could make the choice of going home taking the risks of fall and accidents as she understands the consequences.  Subsequently, [name] is discharged home.  Thiamine and folic acid to help her with alcohol abuse related memory deficits, Starlix to help her with diabetes and Synthroid for the hypothyroidism.  She will follow up with her primary care physician, Dr. Elby Showers.     Lonia Blood, M.D.     SL/MEDQ  D:  02/02/2011  T:  02/03/2011  Job:  [id number]  cc:   Elby Showers, MD  Electronically Signed by Lonia Blood M.D. on 02/03/2011 05:46:37 PM

## 2011-02-04 LAB — VITAMIN D 1,25 DIHYDROXY: Vitamin D2 1, 25 (OH)2: <8 pg/mL

## 2011-05-16 ENCOUNTER — Other Ambulatory Visit: Payer: Self-pay

## 2011-05-16 ENCOUNTER — Inpatient Hospital Stay (HOSPITAL_COMMUNITY)
Admission: EM | Admit: 2011-05-16 | Discharge: 2011-06-04 | DRG: 870 | Disposition: A | Discharge status: Skilled Nursing Facility | Payer: Medicare Other | Attending: Internal Medicine | Admitting: Internal Medicine

## 2011-05-16 ENCOUNTER — Emergency Department (HOSPITAL_COMMUNITY): Payer: Medicare Other

## 2011-05-16 ENCOUNTER — Encounter (HOSPITAL_COMMUNITY): Payer: Self-pay | Admitting: *Deleted

## 2011-05-16 DIAGNOSIS — E781 Pure hyperglyceridemia: Secondary | ICD-10-CM

## 2011-05-16 DIAGNOSIS — R4182 Altered mental status, unspecified: Secondary | ICD-10-CM

## 2011-05-16 DIAGNOSIS — E876 Hypokalemia: Secondary | ICD-10-CM

## 2011-05-16 DIAGNOSIS — K700 Alcoholic fatty liver: Secondary | ICD-10-CM

## 2011-05-16 DIAGNOSIS — D649 Anemia, unspecified: Secondary | ICD-10-CM

## 2011-05-16 DIAGNOSIS — T68XXXA Hypothermia, initial encounter: Secondary | ICD-10-CM

## 2011-05-16 DIAGNOSIS — E873 Alkalosis: Secondary | ICD-10-CM | POA: Diagnosis not present

## 2011-05-16 DIAGNOSIS — E162 Hypoglycemia, unspecified: Secondary | ICD-10-CM

## 2011-05-16 DIAGNOSIS — N179 Acute kidney failure, unspecified: Secondary | ICD-10-CM

## 2011-05-16 DIAGNOSIS — R17 Unspecified jaundice: Secondary | ICD-10-CM | POA: Diagnosis present

## 2011-05-16 DIAGNOSIS — A0472 Enterocolitis due to Clostridium difficile, not specified as recurrent: Secondary | ICD-10-CM

## 2011-05-16 DIAGNOSIS — E039 Hypothyroidism, unspecified: Secondary | ICD-10-CM

## 2011-05-16 DIAGNOSIS — F1020 Alcohol dependence, uncomplicated: Secondary | ICD-10-CM | POA: Diagnosis present

## 2011-05-16 DIAGNOSIS — G9340 Encephalopathy, unspecified: Secondary | ICD-10-CM

## 2011-05-16 DIAGNOSIS — K922 Gastrointestinal hemorrhage, unspecified: Secondary | ICD-10-CM

## 2011-05-16 DIAGNOSIS — W010XXA Fall on same level from slipping, tripping and stumbling without subsequent striking against object, initial encounter: Secondary | ICD-10-CM | POA: Diagnosis not present

## 2011-05-16 DIAGNOSIS — D62 Acute posthemorrhagic anemia: Secondary | ICD-10-CM | POA: Diagnosis present

## 2011-05-16 DIAGNOSIS — D696 Thrombocytopenia, unspecified: Secondary | ICD-10-CM

## 2011-05-16 DIAGNOSIS — K2990 Gastroduodenitis, unspecified, without bleeding: Secondary | ICD-10-CM | POA: Diagnosis present

## 2011-05-16 DIAGNOSIS — K859 Acute pancreatitis without necrosis or infection, unspecified: Secondary | ICD-10-CM | POA: Diagnosis present

## 2011-05-16 DIAGNOSIS — A419 Sepsis, unspecified organism: Principal | ICD-10-CM

## 2011-05-16 DIAGNOSIS — E872 Acidosis, unspecified: Secondary | ICD-10-CM | POA: Diagnosis present

## 2011-05-16 DIAGNOSIS — R6520 Severe sepsis without septic shock: Secondary | ICD-10-CM | POA: Diagnosis present

## 2011-05-16 DIAGNOSIS — E119 Type 2 diabetes mellitus without complications: Secondary | ICD-10-CM

## 2011-05-16 DIAGNOSIS — J9600 Acute respiratory failure, unspecified whether with hypoxia or hypercapnia: Secondary | ICD-10-CM

## 2011-05-16 DIAGNOSIS — K2970 Gastritis, unspecified, without bleeding: Secondary | ICD-10-CM

## 2011-05-16 DIAGNOSIS — E870 Hyperosmolality and hypernatremia: Secondary | ICD-10-CM | POA: Diagnosis present

## 2011-05-16 HISTORY — DX: Reserved for inherently not codable concepts without codable children: IMO0001

## 2011-05-16 HISTORY — DX: Encounter for other specified aftercare: Z51.89

## 2011-05-16 HISTORY — DX: Hypothyroidism, unspecified: E03.9

## 2011-05-16 HISTORY — DX: Chronic obstructive pulmonary disease, unspecified: J44.9

## 2011-05-16 LAB — COMPREHENSIVE METABOLIC PANEL
Albumin: 2.7 g/dL — ABNORMAL LOW (ref 3.5–5.2)
BUN: 25 mg/dL — ABNORMAL HIGH (ref 6–23)
Chloride: 104 mEq/L (ref 96–112)
Creatinine, Ser: 2.36 mg/dL — ABNORMAL HIGH (ref 0.50–1.10)
GFR calc Af Amer: 22 mL/min — ABNORMAL LOW (ref >90)
Glucose, Bld: 164 mg/dL — ABNORMAL HIGH (ref 70–99)
Total Bilirubin: 2 mg/dL — ABNORMAL HIGH (ref 0.3–1.2)
Total Protein: 5 g/dL — ABNORMAL LOW (ref 6.0–8.3)

## 2011-05-16 LAB — PROTIME-INR
INR: 2.01 — ABNORMAL HIGH (ref 0.00–1.49)
Prothrombin Time: 23.1 seconds — ABNORMAL HIGH (ref 11.6–15.2)

## 2011-05-16 LAB — GLUCOSE, CAPILLARY: Glucose-Capillary: 203 mg/dL — ABNORMAL HIGH (ref 70–99)

## 2011-05-16 LAB — LACTIC ACID, PLASMA: Lactic Acid, Venous: 18.8 mmol/L — ABNORMAL HIGH (ref 0.5–2.2)

## 2011-05-16 MED ORDER — SODIUM CHLORIDE 0.9 % IV BOLUS (SEPSIS)
1000.0000 mL | Freq: Once | INTRAVENOUS | Status: AC
  Administered 2011-05-16: 1000 mL via INTRAVENOUS

## 2011-05-16 MED ORDER — HALOPERIDOL LACTATE 5 MG/ML IJ SOLN
INTRAMUSCULAR | Status: AC
  Administered 2011-05-17: 5 mg via INTRAVENOUS
  Filled 2011-05-16: qty 1

## 2011-05-16 MED ORDER — PANTOPRAZOLE SODIUM 40 MG IV SOLR
40.0000 mg | Freq: Once | INTRAVENOUS | Status: AC
  Administered 2011-05-16: 40 mg via INTRAVENOUS

## 2011-05-16 MED ORDER — SODIUM CHLORIDE 0.9 % IV SOLN
8.0000 mg/h | INTRAVENOUS | Status: AC
  Administered 2011-05-16: 8 mg/h via INTRAVENOUS
  Filled 2011-05-16: qty 80

## 2011-05-16 MED ORDER — PANTOPRAZOLE SODIUM 40 MG IV SOLR
80.0000 mg | INTRAVENOUS | Status: DC
  Filled 2011-05-16: qty 80

## 2011-05-16 MED ORDER — PANTOPRAZOLE SODIUM 40 MG IV SOLR
INTRAVENOUS | Status: AC
  Filled 2011-05-16: qty 80

## 2011-05-16 MED ORDER — SODIUM CHLORIDE 0.9 % IV BOLUS (SEPSIS)
1000.0000 mL | Freq: Once | INTRAVENOUS | Status: AC
  Administered 2011-05-17: 1000 mL via INTRAVENOUS

## 2011-05-16 MED ORDER — ONDANSETRON HCL 4 MG/2ML IJ SOLN
INTRAMUSCULAR | Status: AC
  Administered 2011-05-16: 23:00:00
  Filled 2011-05-16: qty 2

## 2011-05-16 MED ORDER — ONDANSETRON HCL 4 MG/2ML IJ SOLN
4.0000 mg | Freq: Once | INTRAMUSCULAR | Status: AC
  Administered 2011-05-16: 4 mg via INTRAVENOUS
  Filled 2011-05-16: qty 2

## 2011-05-16 MED ORDER — DEXTROSE 50 % IV SOLN
INTRAVENOUS | Status: AC
  Administered 2011-05-16: 23:00:00
  Filled 2011-05-16: qty 50

## 2011-05-16 NOTE — ED Notes (Signed)
Per EMS - pt from home - pt was unresponsive found on the couch, coffee ground emesis noted all over pt, pt was incontinent of urine - pt's initial CBG 23 - pt received D50 by EMS - EMS obtained sys BP of 60 and was administered approx of fliud - last BP 80 sys.Pt alert and confused.

## 2011-05-17 ENCOUNTER — Inpatient Hospital Stay (HOSPITAL_COMMUNITY): Payer: Medicare Other

## 2011-05-17 ENCOUNTER — Emergency Department (HOSPITAL_COMMUNITY): Payer: Medicare Other

## 2011-05-17 ENCOUNTER — Encounter (HOSPITAL_COMMUNITY): Payer: Self-pay | Admitting: Emergency Medicine

## 2011-05-17 ENCOUNTER — Encounter (HOSPITAL_COMMUNITY)
Admission: EM | Disposition: A | Discharge status: Skilled Nursing Facility | Payer: Self-pay | Source: Home / Self Care | Attending: Internal Medicine

## 2011-05-17 DIAGNOSIS — R1310 Dysphagia, unspecified: Secondary | ICD-10-CM

## 2011-05-17 DIAGNOSIS — D696 Thrombocytopenia, unspecified: Secondary | ICD-10-CM

## 2011-05-17 DIAGNOSIS — D509 Iron deficiency anemia, unspecified: Secondary | ICD-10-CM

## 2011-05-17 DIAGNOSIS — J9600 Acute respiratory failure, unspecified whether with hypoxia or hypercapnia: Secondary | ICD-10-CM

## 2011-05-17 DIAGNOSIS — K5100 Ulcerative (chronic) pancolitis without complications: Secondary | ICD-10-CM

## 2011-05-17 DIAGNOSIS — E872 Acidosis: Secondary | ICD-10-CM

## 2011-05-17 DIAGNOSIS — K920 Hematemesis: Secondary | ICD-10-CM

## 2011-05-17 DIAGNOSIS — R579 Shock, unspecified: Secondary | ICD-10-CM

## 2011-05-17 HISTORY — PX: ESOPHAGOGASTRODUODENOSCOPY: SHX5428

## 2011-05-17 LAB — POCT I-STAT 3, ART BLOOD GAS (G3+)
Acid-base deficit: 20 mmol/L — ABNORMAL HIGH (ref 0.0–2.0)
Acid-base deficit: 17 mmol/L — ABNORMAL HIGH (ref 0.0–2.0)
Bicarbonate: 9.1 mEq/L — ABNORMAL LOW (ref 20.0–24.0)
O2 Saturation: 98 %
Patient temperature: 95
TCO2: 11 mmol/L (ref 0–100)
TCO2: 10 mmol/L (ref 0–100)
pCO2 arterial: 40.1 mmHg (ref 35.0–45.0)
pCO2 arterial: 28.7 mmHg — ABNORMAL LOW (ref 35.0–45.0)
pH, Arterial: 6.98 — CL (ref 7.350–7.400)
pO2, Arterial: 127 mmHg — ABNORMAL HIGH (ref 80.0–100.0)
pO2, Arterial: 99 mmHg (ref 80.0–100.0)

## 2011-05-17 LAB — BASIC METABOLIC PANEL
CO2: 9 mEq/L — CL (ref 19–32)
Calcium: 5.5 mg/dL — CL (ref 8.4–10.5)
Creatinine, Ser: 2.56 mg/dL — ABNORMAL HIGH (ref 0.50–1.10)
GFR calc Af Amer: 20 mL/min — ABNORMAL LOW (ref >90)
GFR calc non Af Amer: 29 mL/min — ABNORMAL LOW (ref >90)
Glucose, Bld: 165 mg/dL — ABNORMAL HIGH (ref 70–99)
Potassium: 3 mEq/L — ABNORMAL LOW (ref 3.5–5.1)
Sodium: 155 mEq/L — ABNORMAL HIGH (ref 135–145)

## 2011-05-17 LAB — CBC
Hemoglobin: 9.1 g/dL — ABNORMAL LOW (ref 12.0–15.0)
Hemoglobin: 12.8 g/dL (ref 12.0–15.0)
MCV: 94.5 fL (ref 78.0–100.0)
Platelets: 78 10*3/uL — ABNORMAL LOW (ref 150–400)
Platelets: 52 10*3/uL — ABNORMAL LOW (ref 150–400)
RBC: 2.69 MIL/uL — ABNORMAL LOW (ref 3.87–5.11)
RBC: 3.94 MIL/uL (ref 3.87–5.11)
RDW: 19.6 % — ABNORMAL HIGH (ref 11.5–15.5)
WBC: 10.4 10*3/uL (ref 4.0–10.5)
WBC: 8 10*3/uL (ref 4.0–10.5)

## 2011-05-17 LAB — BLOOD GAS, ARTERIAL
Acid-base deficit: 18.5 mmol/L — ABNORMAL HIGH (ref 0.0–2.0)
Bicarbonate: 8.7 mEq/L — ABNORMAL LOW (ref 20.0–24.0)
FIO2: 0.5 %
O2 Saturation: 96.9 %
PEEP: 5 cmH2O
Patient temperature: 96.8
RATE: 35 resp/min
pO2, Arterial: 87.9 mmHg (ref 80.0–100.0)

## 2011-05-17 LAB — CREATININE, URINE, RANDOM: Creatinine, Urine: 50.83 mg/dL

## 2011-05-17 LAB — LIPASE, BLOOD: Lipase: 201 U/L — ABNORMAL HIGH (ref 11–59)

## 2011-05-17 LAB — GLUCOSE, CAPILLARY
Glucose-Capillary: 187 mg/dL — ABNORMAL HIGH (ref 70–99)
Glucose-Capillary: 236 mg/dL — ABNORMAL HIGH (ref 70–99)
Glucose-Capillary: 354 mg/dL — ABNORMAL HIGH (ref 70–99)
Glucose-Capillary: 341 mg/dL — ABNORMAL HIGH (ref 70–99)

## 2011-05-17 LAB — URINALYSIS, ROUTINE W REFLEX MICROSCOPIC
Glucose, UA: 100 mg/dL — AB
Nitrite: NEGATIVE
Specific Gravity, Urine: 1.017 (ref 1.005–1.030)
pH: 5 (ref 5.0–8.0)

## 2011-05-17 LAB — DIFFERENTIAL
Basophils Absolute: 0 10*3/uL (ref 0.0–0.1)
Basophils Relative: 0 % (ref 0–1)
Eosinophils Absolute: 0 10*3/uL (ref 0.0–0.7)
Eosinophils Relative: 0 % (ref 0–5)
Lymphocytes Relative: 33 % (ref 12–46)
Lymphs Abs: 3.4 10*3/uL (ref 0.7–4.0)
Neutro Abs: 6.9 10*3/uL (ref 1.7–7.7)
Neutrophils Relative %: 58 % (ref 43–77)
nRBC: 0 /100 WBC

## 2011-05-17 LAB — ABO/RH: ABO/RH(D): A POS

## 2011-05-17 LAB — URINE MICROSCOPIC-ADD ON

## 2011-05-17 LAB — PROTIME-INR: INR: 1.61 — ABNORMAL HIGH (ref 0.00–1.49)

## 2011-05-17 LAB — PHOSPHORUS: Phosphorus: 7.4 mg/dL — ABNORMAL HIGH (ref 2.3–4.6)

## 2011-05-17 LAB — LACTIC ACID, PLASMA: Lactic Acid, Venous: 8 mmol/L — ABNORMAL HIGH (ref 0.5–2.2)

## 2011-05-17 LAB — ETHANOL: Alcohol, Ethyl (B): <11 mg/dL (ref 0–11)

## 2011-05-17 LAB — APTT: aPTT: 37 seconds (ref 24–37)

## 2011-05-17 SURGERY — EGD (ESOPHAGOGASTRODUODENOSCOPY)
Anesthesia: Moderate Sedation

## 2011-05-17 MED ORDER — CHLORHEXIDINE GLUCONATE 0.12 % MT SOLN
15.0000 mL | Freq: Two times a day (BID) | OROMUCOSAL | Status: DC
  Administered 2011-05-17 – 2011-05-24 (×16): 15 mL via OROMUCOSAL
  Filled 2011-05-17 (×17): qty 15

## 2011-05-17 MED ORDER — INSULIN ASPART 100 UNIT/ML ~~LOC~~ SOLN
0.0000 [IU] | SUBCUTANEOUS | Status: DC
  Administered 2011-05-18: 4 [IU] via SUBCUTANEOUS
  Administered 2011-05-18: 2 [IU] via SUBCUTANEOUS
  Administered 2011-05-18: 7 [IU] via SUBCUTANEOUS
  Administered 2011-05-18: 4 [IU] via SUBCUTANEOUS
  Administered 2011-05-18 – 2011-05-21 (×3): 7 [IU] via SUBCUTANEOUS
  Administered 2011-05-21 (×2): 4 [IU] via SUBCUTANEOUS
  Administered 2011-05-22 (×2): 2 [IU] via SUBCUTANEOUS
  Filled 2011-05-17: qty 3

## 2011-05-17 MED ORDER — NOREPINEPHRINE BITARTRATE 1 MG/ML IJ SOLN
2.0000 ug/min | INTRAVENOUS | Status: DC
  Administered 2011-05-17: 18 ug/min via INTRAVENOUS
  Administered 2011-05-17: 30 ug/min via INTRAVENOUS
  Administered 2011-05-18: 12 ug/min via INTRAVENOUS
  Filled 2011-05-17 (×3): qty 16

## 2011-05-17 MED ORDER — POTASSIUM CHLORIDE 10 MEQ/100ML IV SOLN
10.0000 meq | INTRAVENOUS | Status: AC
  Administered 2011-05-17 (×2): 10 meq via INTRAVENOUS
  Filled 2011-05-17 (×2): qty 100

## 2011-05-17 MED ORDER — SODIUM CHLORIDE 0.9 % IV SOLN
250.0000 mL | INTRAVENOUS | Status: DC | PRN
  Administered 2011-05-19: 250 mL via INTRAVENOUS

## 2011-05-17 MED ORDER — ALBUTEROL SULFATE HFA 108 (90 BASE) MCG/ACT IN AERS
6.0000 | INHALATION_SPRAY | Freq: Four times a day (QID) | RESPIRATORY_TRACT | Status: DC
  Administered 2011-05-18 – 2011-05-22 (×17): 6 via RESPIRATORY_TRACT
  Filled 2011-05-17 (×2): qty 6.7

## 2011-05-17 MED ORDER — MIDAZOLAM HCL 5 MG/ML IJ SOLN
1.0000 mg/h | INTRAMUSCULAR | Status: DC
  Administered 2011-05-17: 2 mg/h via INTRAVENOUS
  Administered 2011-05-17: 1 mg/h via INTRAVENOUS
  Filled 2011-05-17 (×3): qty 10

## 2011-05-17 MED ORDER — ALBUTEROL SULFATE (5 MG/ML) 0.5% IN NEBU: 2.5000 mg | INHALATION_SOLUTION | RESPIRATORY_TRACT | Status: DC | PRN

## 2011-05-17 MED ORDER — VANCOMYCIN 50 MG/ML ORAL SOLUTION
500.0000 mg | Freq: Four times a day (QID) | ORAL | Status: DC
  Administered 2011-05-18 – 2011-05-19 (×6): 500 mg
  Filled 2011-05-17 (×10): qty 10

## 2011-05-17 MED ORDER — PIPERACILLIN-TAZOBACTAM IN DEX 2-0.25 GM/50ML IV SOLN
2.2500 g | Freq: Three times a day (TID) | INTRAVENOUS | Status: DC
  Administered 2011-05-17 – 2011-05-19 (×7): 2.25 g via INTRAVENOUS
  Filled 2011-05-17 (×8): qty 50

## 2011-05-17 MED ORDER — NOREPINEPHRINE BITARTRATE 1 MG/ML IJ SOLN
2.0000 ug/min | INTRAVENOUS | Status: DC
  Administered 2011-05-17: 30 ug/min via INTRAVENOUS
  Administered 2011-05-17: 10 ug/min via INTRAVENOUS
  Administered 2011-05-17: 20 ug/min via INTRAVENOUS
  Filled 2011-05-17 (×3): qty 4

## 2011-05-17 MED ORDER — BIOTENE DRY MOUTH MT LIQD
15.0000 mL | Freq: Four times a day (QID) | OROMUCOSAL | Status: DC
  Administered 2011-05-17 – 2011-05-30 (×43): 15 mL via OROMUCOSAL

## 2011-05-17 MED ORDER — METRONIDAZOLE IN NACL 5-0.79 MG/ML-% IV SOLN
500.0000 mg | Freq: Three times a day (TID) | INTRAVENOUS | Status: DC
  Administered 2011-05-18 – 2011-05-19 (×5): 500 mg via INTRAVENOUS
  Filled 2011-05-17 (×6): qty 100

## 2011-05-17 MED ORDER — PIPERACILLIN-TAZOBACTAM 3.375 G IVPB 30 MIN: 3.3750 g | INTRAVENOUS | Status: AC

## 2011-05-17 MED ORDER — POTASSIUM CHLORIDE 10 MEQ/100ML IV SOLN
10.0000 meq | INTRAVENOUS | Status: AC
  Administered 2011-05-17 – 2011-05-18 (×3): 10 meq via INTRAVENOUS
  Filled 2011-05-17 (×3): qty 100

## 2011-05-17 MED ORDER — VANCOMYCIN HCL IN DEXTROSE 1-5 GM/200ML-% IV SOLN
1000.0000 mg | Freq: Once | INTRAVENOUS | Status: AC
  Administered 2011-05-17: 1000 mg via INTRAVENOUS
  Filled 2011-05-17: qty 200

## 2011-05-17 MED ORDER — ALBUTEROL SULFATE (5 MG/ML) 0.5% IN NEBU
2.5000 mg | INHALATION_SOLUTION | Freq: Four times a day (QID) | RESPIRATORY_TRACT | Status: DC
  Administered 2011-05-17 (×2): 2.5 mg via RESPIRATORY_TRACT
  Filled 2011-05-17: qty 0.5

## 2011-05-17 MED ORDER — SODIUM CHLORIDE 0.9 % IV SOLN
2.0000 g | Freq: Once | INTRAVENOUS | Status: AC
  Administered 2011-05-17: 2 g via INTRAVENOUS
  Filled 2011-05-17: qty 20

## 2011-05-17 MED ORDER — ETOMIDATE 2 MG/ML IV SOLN
6.0000 mg | Freq: Once | INTRAVENOUS | Status: AC
  Administered 2011-05-17: 6 mg via INTRAVENOUS

## 2011-05-17 MED ORDER — FENTANYL CITRATE 0.05 MG/ML IJ SOLN
INTRAMUSCULAR | Status: AC
  Filled 2011-05-17: qty 2

## 2011-05-17 MED ORDER — MIDAZOLAM HCL 10 MG/2ML IJ SOLN
INTRAMUSCULAR | Status: AC
  Filled 2011-05-17: qty 2

## 2011-05-17 MED ORDER — PROPOFOL 10 MG/ML IV EMUL
5.0000 ug/kg/min | INTRAVENOUS | Status: DC
  Administered 2011-05-17: 49.259 ug/kg/min via INTRAVENOUS

## 2011-05-17 MED ORDER — SODIUM CHLORIDE 0.9 % IV SOLN
50.0000 ug/h | INTRAVENOUS | Status: DC
  Administered 2011-05-17: 50 ug/h via INTRAVENOUS
  Filled 2011-05-17 (×5): qty 1

## 2011-05-17 MED ORDER — POTASSIUM CHLORIDE 10 MEQ/50ML IV SOLN
INTRAVENOUS | Status: AC
  Administered 2011-05-17: 10 meq
  Filled 2011-05-17: qty 50

## 2011-05-17 MED ORDER — NOREPINEPHRINE BITARTRATE 1 MG/ML IJ SOLN
2.0000 ug/min | INTRAVENOUS | Status: AC
  Administered 2011-05-17: 10 ug/min via INTRAVENOUS
  Filled 2011-05-17: qty 4

## 2011-05-17 MED ORDER — VANCOMYCIN HCL IN DEXTROSE 1-5 GM/200ML-% IV SOLN: 1000.0000 mg | INTRAVENOUS | Status: DC

## 2011-05-17 MED ORDER — ALBUTEROL SULFATE (5 MG/ML) 0.5% IN NEBU
INHALATION_SOLUTION | RESPIRATORY_TRACT | Status: AC
  Administered 2011-05-17: 2.5 mg via RESPIRATORY_TRACT
  Filled 2011-05-17: qty 0.5

## 2011-05-17 MED ORDER — NOREPINEPHRINE BITARTRATE 1 MG/ML IJ SOLN
0.5000 ug/kg/min | INTRAVENOUS | Status: DC
  Filled 2011-05-17: qty 0.5

## 2011-05-17 MED ORDER — SODIUM CHLORIDE 0.9 % IV SOLN
25.0000 ug/h | INTRAVENOUS | Status: DC
  Administered 2011-05-17: 100 ug/h via INTRAVENOUS
  Administered 2011-05-19: 75 ug/h via INTRAVENOUS
  Filled 2011-05-17 (×2): qty 50

## 2011-05-17 MED ORDER — ACETAMINOPHEN 160 MG/5ML PO SOLN
650.0000 mg | Freq: Four times a day (QID) | ORAL | Status: DC | PRN
  Administered 2011-05-17 – 2011-05-18 (×2): 650 mg via ORAL
  Filled 2011-05-17 (×3): qty 20.3

## 2011-05-17 MED ORDER — PROPOFOL 10 MG/ML IV EMUL
INTRAVENOUS | Status: AC
  Administered 2011-05-17: 49.259 ug/kg/min via INTRAVENOUS
  Filled 2011-05-17: qty 100

## 2011-05-17 MED ORDER — DEXTROSE 10 % IV SOLN
INTRAVENOUS | Status: DC
  Administered 2011-05-23 – 2011-05-25 (×2): via INTRAVENOUS

## 2011-05-17 MED ORDER — SUCRALFATE 1 GM/10ML PO SUSP
1.0000 g | Freq: Four times a day (QID) | ORAL | Status: DC
  Administered 2011-05-17 – 2011-05-21 (×15): 1 g
  Filled 2011-05-17 (×19): qty 10

## 2011-05-17 MED ORDER — STERILE WATER FOR INJECTION IV SOLN
INTRAVENOUS | Status: DC
  Administered 2011-05-17: 21:00:00 via INTRAVENOUS
  Filled 2011-05-17 (×5): qty 850

## 2011-05-17 MED ORDER — SODIUM BICARBONATE 8.4 % IV SOLN
INTRAVENOUS | Status: DC
  Administered 2011-05-17 (×2): via INTRAVENOUS
  Filled 2011-05-17 (×3): qty 150

## 2011-05-17 MED ORDER — LACTATED RINGERS IV SOLN
INTRAVENOUS | Status: DC
  Administered 2011-05-17: 04:00:00 via INTRAVENOUS

## 2011-05-17 MED ORDER — SODIUM CHLORIDE 0.9 % IV BOLUS (SEPSIS)
500.0000 mL | Freq: Once | INTRAVENOUS | Status: AC
  Administered 2011-05-17: 500 mL via INTRAVENOUS

## 2011-05-17 MED ORDER — INSULIN GLARGINE 100 UNIT/ML ~~LOC~~ SOLN
10.0000 [IU] | Freq: Every day | SUBCUTANEOUS | Status: DC
  Administered 2011-05-17 – 2011-05-18 (×2): 10 [IU] via SUBCUTANEOUS
  Filled 2011-05-17: qty 3

## 2011-05-17 MED ORDER — SODIUM CHLORIDE 0.9 % IV SOLN
8.0000 mg/h | INTRAVENOUS | Status: DC
  Administered 2011-05-17 – 2011-05-19 (×5): 8 mg/h via INTRAVENOUS
  Filled 2011-05-17 (×10): qty 80

## 2011-05-17 MED ORDER — HALOPERIDOL LACTATE 5 MG/ML IJ SOLN
5.0000 mg | Freq: Once | INTRAMUSCULAR | Status: AC
  Administered 2011-05-17: 5 mg via INTRAVENOUS

## 2011-05-17 MED ORDER — FREE WATER
200.0000 mL | Freq: Three times a day (TID) | Status: DC
  Filled 2011-05-17 (×5): qty 200

## 2011-05-17 MED ORDER — INSULIN ASPART 100 UNIT/ML ~~LOC~~ SOLN
0.0000 [IU] | SUBCUTANEOUS | Status: DC
  Administered 2011-05-17 (×2): 15 [IU] via SUBCUTANEOUS
  Administered 2011-05-17: 11 [IU] via SUBCUTANEOUS
  Filled 2011-05-17: qty 3

## 2011-05-17 MED ORDER — VANCOMYCIN HCL 1000 MG IV SOLR
1000.0000 mg | INTRAVENOUS | Status: DC
  Filled 2011-05-17: qty 1000

## 2011-05-17 NOTE — Consult Note (Signed)
Reason for Consult:  Abd distension and colitis  Referring Physician: Dr. Quentin Ore [name] is an 77 y.o. female.  HPI: 77 year old female came to ED yesterday after having been found down by her family.  She had been out of contact for 1-2 days.  She was found on the floor covered in coffee ground emesis.  She is known to be a diabetic and a heavy drinker.  She also had a blood sugar of 23 by EMS and received an amp of D50 en route.  In the ED, she was hypotensive with an elevated lactate.  She also had respiratory failure and was intubated.  She was placed on norepinephrine and protonix gtts.  Over the course of the day, her abdomen has become more distended.  A CT scan was performed, demonstrating pan colitis.  She has no known history of recent antibiotic use.  She reportedly has not had sick contacts.     Past Medical History  Diagnosis Date  . Abdominal pain   . Hypothyroid   . Diabetes mellitus   . COPD (chronic obstructive pulmonary disease)   . GERD (gastroesophageal reflux disease)     Past Surgical History  Procedure Date  . Abdominal hysterectomy     Family History  Problem Relation Age of Onset  . Stroke Mother     Social History:  reports that she has been smoking Cigarettes.  She does not have any smokeless tobacco history on file. She reports that she drinks alcohol. She reports that she does not use illicit drugs.  Allergies: No Known Allergies  Medications:    . albuterol  6 puff Inhalation Q6H  . antiseptic oral rinse  15 mL Mouth Rinse QID  . calcium gluconate  2 g Intravenous Once  . chlorhexidine  15 mL Mouth Rinse BID  . dextrose      . etomidate  6 mg Intravenous Once  . haloperidol lactate  5 mg Intravenous Once  . insulin aspart  0-7 Units Subcutaneous Q4H  . norepinephrine (LEVOPHED) Adult infusion  2-50 mcg/min Intravenous To Major  . ondansetron      . ondansetron (ZOFRAN) IV  4 mg Intravenous Once  . pantoprozole (PROTONIX) infusion  8  mg/hr Intravenous To Major  .      .      .      . piperacillin-tazobactam  3.375 g Intravenous STAT   Vancomycin      PRN meds:  sodium chloride, acetaminophen (TYLENOL) oral liquid 160 mg/5 mL, albuterol   Labs:    GLUCOSE, CAPILLARY     Status: Abnormal   Collection Time   05/16/11 10:18 PM      Component Value Range Comment   Glucose-Capillary 203 (*) 70 - 99 (mg/dL)   LACTIC ACID, PLASMA     Status: Abnormal   Collection Time   05/16/11 10:40 PM      Component Value Range Comment   Lactic Acid, Venous 18.8 (*) 0.5 - 2.2 (mmol/L)   CBC     Status: Abnormal   Collection Time   05/16/11 11:03 PM      Component Value Range Comment   WBC 10.4  4.0 - 10.5 (K/uL)    RBC 2.69 (*) 3.87 - 5.11 (MIL/uL)    Hemoglobin 9.1 (*) 12.0 - 15.0 (g/dL)    HCT 04.5 (*) 40.9 - 46.0 (%)    MCV 106.7 (*) 78.0 - 100.0 (fL)    MCH 33.8  26.0 - 34.0 (pg)    MCHC 31.7  30.0 - 36.0 (g/dL)    RDW 47.8  29.5 - 62.1 (%)    Platelets 112 (*) 150 - 400 (K/uL) PLATELET COUNT CONFIRMED BY SMEAR  DIFFERENTIAL     Status: Abnormal   Collection Time   05/16/11 11:03 PM      Component Value Range Comment   Neutrophils Relative 58  43 - 77 (%)    Lymphocytes Relative 33  12 - 46 (%)    Monocytes Relative 1 (*) 3 - 12 (%)    Eosinophils Relative 0  0 - 5 (%)    Basophils Relative 0  0 - 1 (%)    Band Neutrophils 8  0 - 10 (%)    Metamyelocytes Relative 0      Myelocytes 0      Promyelocytes Absolute 0      Blasts 0      nRBC 0  0 (/100 WBC)    Neutro Abs 6.9  1.7 - 7.7 (K/uL)    Lymphs Abs 3.4  0.7 - 4.0 (K/uL)    Monocytes Absolute 0.1  0.1 - 1.0 (K/uL)    Eosinophils Absolute 0.0  0.0 - 0.7 (K/uL)    Basophils Absolute 0.0  0.0 - 0.1 (K/uL)    RBC Morphology HOWELL/JOLLY BODIES   TEARDROP CELLS  COMPREHENSIVE METABOLIC PANEL     Status: Abnormal   Collection Time   05/16/11 11:03 PM      Component Value Range Comment   Sodium 158 (*) 135 - 145 (mEq/L)    Potassium 3.7  3.5 - 5.1 (mEq/L)  HEMOLYSIS AT THIS LEVEL MAY AFFECT RESULT   Chloride 104  96 - 112 (mEq/L)    CO2 5 (*) 19 - 32 (mEq/L)    Glucose, Bld 164 (*) 70 - 99 (mg/dL)    BUN 25 (*) 6 - 23 (mg/dL)    Creatinine, Ser 3.08 (*) 0.50 - 1.10 (mg/dL)    Calcium 6.4 (*) 8.4 - 10.5 (mg/dL)    Total Protein 5.0 (*) 6.0 - 8.3 (g/dL)    Albumin 2.7 (*) 3.5 - 5.2 (g/dL)    AST 657 (*) 0 - 37 (U/L) HEMOLYSIS AT THIS LEVEL MAY AFFECT RESULT   ALT 35  0 - 35 (U/L)    Alkaline Phosphatase 85  39 - 117 (U/L)    Total Bilirubin 2.0 (*) 0.3 - 1.2 (mg/dL)    GFR calc non Af Amer 19 (*) >90 (mL/min)    GFR calc Af Amer 22 (*) >90 (mL/min)   APTT     Status: Normal   Collection Time   05/16/11 11:03 PM      Component Value Range Comment   aPTT 37  24 - 37 (seconds)   PROTIME-INR     Status: Abnormal   Collection Time   05/16/11 11:03 PM      Component Value Range Comment   Prothrombin Time 23.1 (*) 11.6 - 15.2 (seconds)    INR 2.01 (*) 0.00 - 1.49    LIPASE, BLOOD     Status: Abnormal   Collection Time   05/16/11 11:03 PM      Component Value Range Comment   Lipase 201 (*) 11 - 59 (U/L)   ABO/RH     Status: Normal   Collection Time   05/16/11 11:10 PM      Component Value Range Comment   ABO/RH(D) A POS     PREPARE  RBC (CROSSMATCH)     Status: Normal   Collection Time   05/17/11 12:08 AM      Component Value Range Comment   Order Confirmation ORDER PROCESSED BY BLOOD BANK     MRSA PCR SCREENING     Status: Normal   Collection Time   05/17/11  1:45 AM      Component Value Range Comment   MRSA by PCR NEGATIVE  NEGATIVE    GLUCOSE, CAPILLARY     Status: Abnormal   Collection Time   05/17/11  1:49 AM      Component Value Range Comment   Glucose-Capillary 162 (*) 70 - 99 (mg/dL)   BASIC METABOLIC PANEL     Status: Abnormal   Collection Time   05/17/11  2:00 AM      Component Value Range Comment   Sodium 155 (*) 135 - 145 (mEq/L)    Potassium 3.0 (*) 3.5 - 5.1 (mEq/L)    Chloride 117 (*) 96 - 112 (mEq/L) DELTA  CHECK NOTED   CO2 9 (*) 19 - 32 (mEq/L)    Glucose, Bld 165 (*) 70 - 99 (mg/dL)    BUN 19  6 - 23 (mg/dL)    Creatinine, Ser 0.45 (*) 0.50 - 1.10 (mg/dL)    Calcium 4.6 (*) 8.4 - 10.5 (mg/dL)    GFR calc non Af Amer 29 (*) >90 (mL/min)    GFR calc Af Amer 33 (*) >90 (mL/min)   MAGNESIUM     Status: Abnormal   Collection Time   05/17/11  2:00 AM      Component Value Range Comment   Magnesium 1.2 (*) 1.5 - 2.5 (mg/dL)   PHOSPHORUS     Status: Abnormal   Collection Time   05/17/11  2:00 AM      Component Value Range Comment   Phosphorus 7.4 (*) 2.3 - 4.6 (mg/dL)   ETHANOL     Status: Normal   Collection Time   05/17/11  2:00 AM      Component Value Range Comment   Alcohol, Ethyl (B) <11  0 - 11 (mg/dL)   POCT I-STAT 3, BLOOD GAS (G3+)     Status: Abnormal   Collection Time   05/17/11  2:25 AM      Component Value Range Comment   pH, Arterial 6.980 (*) 7.350 - 7.400     pCO2 arterial 40.1  35.0 - 45.0 (mmHg)    pO2, Arterial 65.0 (*) 80.0 - 100.0 (mmHg)    Bicarbonate 9.7 (*) 20.0 - 24.0 (mEq/L)    TCO2 11  0 - 100 (mmol/L)    O2 Saturation 83.0      Acid-base deficit 21.0 (*) 0.0 - 2.0 (mmol/L)    Patient temperature 95.0 F      Collection site ARTERIAL LINE      Drawn by Operator      Sample type ARTERIAL      Comment NOTIFIED PHYSICIAN     PREPARE FRESH FROZEN PLASMA     Status: Normal (Preliminary result)   Collection Time   05/17/11  2:30 AM      Component Value Range Comment   Unit Number [id number]      Blood Component Type THAWED PLASMA      Unit division 00      Status of Unit ISSUED      Transfusion Status OK TO TRANSFUSE     URINALYSIS, ROUTINE W REFLEX MICROSCOPIC     Status: Abnormal  Collection Time   05/17/11  2:45 AM      Component Value Range Comment   Color, Urine AMBER (*) YELLOW  BIOCHEMICALS MAY BE AFFECTED BY COLOR   Appearance TURBID (*) CLEAR     Specific Gravity, Urine 1.017  1.005 - 1.030     pH 5.0  5.0 - 8.0     Glucose, UA 100 (*)  NEGATIVE (mg/dL)    Hgb urine dipstick LARGE (*) NEGATIVE     Bilirubin Urine MODERATE (*) NEGATIVE     Ketones, ur 15 (*) NEGATIVE (mg/dL)    Protein, ur >161 (*) NEGATIVE (mg/dL)    Urobilinogen, UA 1.0  0.0 - 1.0 (mg/dL)    Nitrite NEGATIVE  NEGATIVE     Leukocytes, UA TRACE (*) NEGATIVE    SODIUM, URINE, RANDOM     Status: Normal   Collection Time   05/17/11  2:45 AM      Component Value Range Comment   Sodium, Ur 90     CREATININE, URINE, RANDOM     Status: Normal   Collection Time   05/17/11  2:45 AM      Component Value Range Comment   Creatinine, Urine 50.83     URINE MICROSCOPIC-ADD ON     Status: Abnormal   Collection Time   05/17/11  2:45 AM      Component Value Range Comment   Squamous Epithelial / LPF MANY (*) RARE     WBC, UA 0-2  <3 (WBC/hpf)    RBC / HPF 7-10  <3 (RBC/hpf)    Bacteria, UA MANY (*) RARE     Casts GRANULAR CAST (*) NEGATIVE  HYALINE CASTS  POCT I-STAT 3, BLOOD GAS (G3+)     Status: Abnormal   Collection Time   05/17/11  3:43 AM      Component Value Range Comment   pH, Arterial 7.093 (*) 7.350 - 7.400     pCO2 arterial 28.7 (*) 35.0 - 45.0 (mmHg)    pO2, Arterial 127.0 (*) 80.0 - 100.0 (mmHg)    Bicarbonate 9.1 (*) 20.0 - 24.0 (mEq/L)    TCO2 10  0 - 100 (mmol/L)    O2 Saturation 98.0      Acid-base deficit 20.0 (*) 0.0 - 2.0 (mmol/L)    Patient temperature 95.0 F      Collection site ARTERIAL LINE      Drawn by Operator      Sample type ARTERIAL      Comment NOTIFIED PHYSICIAN     GLUCOSE, CAPILLARY     Status: Abnormal   Collection Time   05/17/11  4:30 AM      Component Value Range Comment   Glucose-Capillary 187 (*) 70 - 99 (mg/dL)   CBC     Status: Abnormal   Collection Time   05/17/11  5:00 AM      Component Value Range Comment   WBC 5.1  4.0 - 10.5 (K/uL)    RBC 3.94  3.87 - 5.11 (MIL/uL)    Hemoglobin 12.8  12.0 - 15.0 (g/dL) POST TRANSFUSION SPECIMEN   HCT 38.0  36.0 - 46.0 (%)    MCV 96.4  78.0 - 100.0 (fL) POST  TRANSFUSION SPECIMEN   MCH 32.5  26.0 - 34.0 (pg)    MCHC 33.7  30.0 - 36.0 (g/dL)    RDW 09.6 (*) 04.5 - 15.5 (%)    Platelets 78 (*) 150 - 400 (K/uL) CONSISTENT WITH PREVIOUS RESULT  BLOOD GAS,  ARTERIAL     Status: Abnormal   Collection Time   05/17/11  5:39 AM      Component Value Range Comment   FIO2 .50      Delivery systems VENTILATOR      Mode PRESSURE REGULATED VOLUME CONTROL      VT 290      Rate 35      Peep/cpap 5.0      pH, Arterial 7.162 (*) 7.350 - 7.400     pCO2 arterial 24.8 (*) 35.0 - 45.0 (mmHg)    pO2, Arterial 87.9  80.0 - 100.0 (mmHg)    Bicarbonate 8.7 (*) 20.0 - 24.0 (mEq/L)    TCO2 9.5  0 - 100 (mmol/L)    Acid-base deficit 18.5 (*) 0.0 - 2.0 (mmol/L)    O2 Saturation 96.9      Patient temperature 96.8      Collection site A-LINE      Drawn by [id number]      Sample type ARTERIAL DRAW      Allens test (pass/fail) PASS  PASS    GLUCOSE, CAPILLARY     Status: Abnormal   Collection Time   05/17/11  7:37 AM      Component Value Range Comment   Glucose-Capillary 236 (*) 70 - 99 (mg/dL)   POCT I-STAT 3, BLOOD GAS (G3+)     Status: Abnormal   Collection Time   05/17/11  8:47 AM      Component Value Range Comment   pH, Arterial 7.224 (*) 7.350 - 7.400     pCO2 arterial 21.3 (*) 35.0 - 45.0 (mmHg)    pO2, Arterial 99.0  80.0 - 100.0 (mmHg)    Bicarbonate 8.6 (*) 20.0 - 24.0 (mEq/L)    TCO2 9  0 - 100 (mmol/L)    O2 Saturation 95.0      Acid-base deficit 17.0 (*) 0.0 - 2.0 (mmol/L)    Patient temperature 38.9 C      Collection site ARTERIAL LINE      Drawn by Operator      Sample type ARTERIAL      Comment NOTIFIED PHYSICIAN     GLUCOSE, CAPILLARY     Status: Abnormal   Collection Time   05/17/11 11:13 AM      Component Value Range Comment   Glucose-Capillary 349 (*) 70 - 99 (mg/dL)   GLUCOSE, CAPILLARY     Status: Abnormal   Collection Time   05/17/11  3:29 PM      Component Value Range Comment   Glucose-Capillary 354 (*) 70 - 99 (mg/dL)   BASIC  METABOLIC PANEL     Status: Abnormal   Collection Time   05/17/11  5:45 PM      Component Value Range Comment   Sodium 142  135 - 145 (mEq/L) DELTA CHECK NOTED   Potassium 2.6 (*) 3.5 - 5.1 (mEq/L)    Chloride 103  96 - 112 (mEq/L) DELTA CHECK NOTED   CO2 14 (*) 19 - 32 (mEq/L)    Glucose, Bld 386 (*) 70 - 99 (mg/dL)    BUN 26 (*) 6 - 23 (mg/dL)    Creatinine, Ser 0.45 (*) 0.50 - 1.10 (mg/dL) DELTA CHECK NOTED   Calcium 5.5 (*) 8.4 - 10.5 (mg/dL)    GFR calc non Af Amer 17 (*) >90 (mL/min)    GFR calc Af Amer 20 (*) >90 (mL/min)   CBC     Status: Abnormal   Collection Time  05/17/11  5:45 PM      Component Value Range Comment   WBC 8.0  4.0 - 10.5 (K/uL)    RBC 3.98  3.87 - 5.11 (MIL/uL)    Hemoglobin 13.0  12.0 - 15.0 (g/dL)    HCT 04.5  40.9 - 81.1 (%)    MCV 94.5  78.0 - 100.0 (fL)    MCH 32.7  26.0 - 34.0 (pg)    MCHC 34.6  30.0 - 36.0 (g/dL)    RDW 91.4 (*) 78.2 - 15.5 (%)    Platelets 52 (*) 150 - 400 (K/uL) PLATELET COUNT CONFIRMED BY SMEAR  PROTIME-INR     Status: Abnormal   Collection Time   05/17/11  5:45 PM      Component Value Range Comment   Prothrombin Time 19.4 (*) 11.6 - 15.2 (seconds)    INR 1.61 (*) 0.00 - 1.49    APTT     Status: Normal   Collection Time   05/17/11  5:45 PM      Component Value Range Comment   aPTT 37  24 - 37 (seconds)   LACTIC ACID, PLASMA     Status: Abnormal   Collection Time   05/17/11  5:59 PM      Component Value Range Comment   Lactic Acid, Venous 8.0 (*) 0.5 - 2.2 (mmol/L)   GLUCOSE, CAPILLARY     Status: Abnormal   Collection Time   05/17/11  8:15 PM      Component Value Range Comment   Glucose-Capillary 341 (*) 70 - 99 (mg/dL)     Ct Abdomen Pelvis Wo Contrast  05/17/2011  *RADIOLOGY REPORT*  Clinical Data: Sepsis.  Intra-abdominal abscess.  CT ABDOMEN AND PELVIS WITHOUT CONTRAST  Technique:  Multidetector CT imaging of the abdomen and pelvis was performed following the standard protocol without intravenous contrast.   IMPRESSION: 1.  Small bilateral pleural effusions with dependent atelectasis. 2.  Right infrahilar airspace consolidation suspicious for pneumonia or aspiration. 3.  Atherosclerosis and coronary artery disease. 4.  Fatty liver. 5.  Old granulomatous disease of the spleen. 6.  Diffuse pan colitis.  Question C difficile colitis versus inflammatory bowel disease. 7.  Destructive changes of the SI joints.  Question seronegative spondyloarthropathy.  This could also represent severe degenerative changes. 8.  3 cm infrarenal abdominal aortic aneurysm.  Original Report Authenticated By: Andreas Newport, M.D.   Dg Chest Port 1 View  05/17/2011  *RADIOLOGY REPORT*  Clinical Data: Endotracheal tube placement.  PORTABLE CHEST - 1 VIEW  Comparison: Chest radiograph performed earlier today at 12:38 a.m.  Findings: The patient's endotracheal tube is seen ending 3 cm above the carina.  An enteric tube is noted extending into the stomach. A left IJ line is noted ending about the proximal SVC.  The lungs are relatively well expanded.  Vascular congestion is noted, with bilateral central airspace opacities, likely reflecting mild interstitial edema, similar in appearance to the prior study. No pleural effusion or pneumothorax is seen.  The cardiomediastinal silhouette is borderline enlarged.  No acute osseous abnormalities are identified.  IMPRESSION:  1.  Endotracheal tube seen ending 3 cm above the carina. 2.  Vascular congestion and borderline cardiomegaly, with bilateral central airspace opacities, likely reflecting relatively stable mild interstitial edema.  Original Report Authenticated By: Tonia Ghent, M.D.    Review of Systems  Unable to perform ROS: intubated   Blood pressure 91/64, pulse 129, temperature 101.7 F (38.7 C), temperature source Oral, resp. rate 35,  height 4\' 7"  (1.397 m), weight 108 lb 11 oz (49.3 kg), last menstrual period 05/17/1983, SpO2 99.00%.   Physical Exam  Constitutional: She appears  well-developed and well-nourished. No distress.  HENT:  Head: Atraumatic.       Anasarca   Eyes: No scleral icterus.  Neck: Normal range of motion. Neck supple. No tracheal deviation present. No thyromegaly present.  Cardiovascular: Regular rhythm, normal heart sounds and intact distal pulses.  Exam reveals no gallop and no friction rub.   No murmur heard.      tachycardic  Respiratory: She is in respiratory distress. She has wheezes. She has rales. She exhibits no tenderness.       Vented at rate of 35, FiO2 50%  GI: Soft. She exhibits distension.       Hypoactive bowel sounds. Unable to assess tenderness, rebound, or guarding due to sedation  Musculoskeletal: She exhibits edema. She exhibits no tenderness.  Lymphadenopathy:    She has no cervical adenopathy.  Neurological:       Sedated, ventilated, not following commands   Skin: Skin is warm and dry. No rash noted. She is not diaphoretic. No erythema. There is pallor.  Psychiatric:       Exam limited by sedation and intubation   BP 102/70  Pulse 35  Temp(Src) 101.3 F (38.5 C) (Oral)  Resp 35  Ht 4\' 7"  (1.397 m)  Wt 108 lb 11 oz (49.3 kg)  BMI 25.26 kg/m2  SpO2 83%  LMP 05/17/1983   Assessment/Plan:  Pan colitis  Likely infectious  Broad spectrum antibiotics  Rehydration  Would include po flagyl in the event that this is C. Difficile.  Would send stool for C. Diff, if not already done.  Lactate is decreasing and pressors are decreasing as well, would continue supportive non operative care at this time.  WBCs normal, though.  ? Demand ischemia.   Septic Shock  Hypotension managed by CCM with levophed, fluid resuscitation.   Acute renal insufficiency  Rehydrate as possible.  Likely ATN Gastritis/UGIB  Does not seem to be cause of hypotension, likely secondary to chronic EtOH intake Pancreatitis    Chemical pancreatitis with elevated lipase to 204.  Also likely secondary to alcohol use.  HIDA scan negative  several months ago.   Did not see RUQ ultrasound to evaluate for cholecystitis, will review.  Supportive care  NPO Thrombocytopenia  If needs surgical intervention, will need plt transfusion.  Likely secondary to sepsis, given INR also elevated.   Acidosis  Supportive care with rehydration, bicarb if needed, may need dialysis if renal function gets worse. Hypokalemia  Replete Acute respiratory failure  On full ventilator support.      Keisi Eckford 05/17/2011, 8:50 PM

## 2011-05-17 NOTE — Consult Note (Signed)
Patient name: [name] Medical record number: [id number] Date of birth: [birth year] Age: 77 y.o. Gender: female PCP: No primary provider on file.  Date: 05/17/2011 Reason for Consult: HYPOtension, UGIB Referring Physician: Kathalene Frames  Brief history This is a 77yo F who was found down by family covered in coffe ground emesis. Severe HYPOtension and lactic acidosis in ER.  Lines/tubes L IJ 11/25 >>> Foley 11/25 >>> Radial art 11/25 >>> NGT 11/25 >>>  Culture data/sepsis markers Blood cx 11/25 >>> Urine cx 11/25 >>>  Antibiotics vanc 11/25 >>> Zosyn 11/25 >>>  Best practice SCDs PPI gtt  Protocols/consults GI  Events/studies n/a  HPI: This is a 77yo F apparent heavy drinker who family had not been in contact with for a day or so who was found down at home covered in coffee ground emesis. She aroused to family but did not stay alert so EMS was called. Apparently her SBP in the field was in the 60s and her glucose was in the 20s. In the ER she was altered, hypothermic, HYPOtensive, found to have AKI, [Na] ~160, and lactate ~19. She received 4L NS with little response in MAPs.  ROS: unable to assess secondary to AMS  Past Medical History  Diagnosis Date  . Abdominal pain   . Hypothyroid   . Diabetes mellitus   ?COPD EtOH GERD HYPOthyroidism s/p RAI ablation HTN Osteoporosis Dysphagia Frequent falls  Past Surgical History: none listed in chart, unable to assess secondary to AMS  Family: unable to assess secondary to AMS  Social History: unable to assess secondary to AMS  Allergies: No Known Allergies  Medications:  Prior to Admission medications   Medication Sig Start Date End Date Taking? Authorizing Provider  dextrose 50 % solution Inject 25 g into the vein once.     Yes Historical Provider, MD  ondansetron in dextrose solution Inject 4 mg into the vein once.     Yes Historical Provider, MD   Temp:  [91.6 F (33.1 C)-93.7 F (34.3 C)] 93.7  F (34.3 C) (11/25 0056) Pulse Rate:  [60-91] 60  (11/25 0056) Resp:  [11-25] 11  (11/25 0056) BP: (68-90)/(42-60) 85/60 mmHg (11/25 0056) SpO2:  [93 %-100 %] 100 % (11/25 0056) Weight:  [45 kg (99 lb 3.3 oz)] 99 lb 3.3 oz (45 kg) (11/24 2315)  gen a+o x 2 ENT dry mucous membranes with dried blood Neck no masses or JVD lypmh no cervical or supraclav LAD CV tachycardic, no murmurs pulm CTAB without wheezes or crackles abd soft and non-tender, +BS Ext no LE edema or clubbing Skin no rashes  Lab Results  Component Value Date   CREATININE 2.36* 05/16/2011   BUN 25* 05/16/2011   NA 158* 05/16/2011   K 3.7 05/16/2011   CL 104 05/16/2011   CO2 5* 05/16/2011   Lab Results  Component Value Date   WBC 10.4 05/16/2011   HGB 9.1* 05/16/2011   HCT 28.7* 05/16/2011   MCV 106.7* 05/16/2011   PLT 112* 05/16/2011   Lab Results  Component Value Date   ALT 35 05/16/2011   AST 126* 05/16/2011   ALKPHOS 85 05/16/2011   BILITOT 2.0* 05/16/2011   Lab Results  Component Value Date   INR 2.01* 05/16/2011   INR 1.23 01/31/2011   CXR: unremarkable  Assessment and Plan: This is a 77yo F with likley UGIB and hemmorrhagic shock.  1. UGIB, HYPOtension/shock, lactic acidosis -- CVL for pressors, levo gtt -- insert art line now --  cont vol loading for now with LR -- NGT and lavage -- serial CBCs -- ER spoke to GI who will see -- protonix bolus and gtt -- will add octreotide for now although unlikley variceal bleed -- transfuse 2U PRBCs despite initial hgb given acute bleed -- transfuse 2U FFP for INR ~2 -- cover with broad spectrum ABX for now but unlikley sepsis -- appropriate cultures sent  2. HYPOglycemia -- possibly related to oral hypoglycemics and no po intake -- frequent monitoring overnight  3. AKI -- likley volume depeltion -- random urine [Na], [Cr] for FeNa -- no renal US at this point  4. AMS, HYPERnatremia -- free H2O 200cc q8h per tube -- recheck BMET in  AM  5. Best Practices -- SCDs -- PPI gtt  FULL CODE (discussed with daughter at bedside in ER)  The patient is critically ill with multiple organ systems failure and requires high complexity decision making for assessment and support, frequent evaluation and titration of therapies, application of advanced monitoring technologies and extensive interpretation of multiple databases.  Total critical care time excluding procedures: 90 min  Joie Bimler MD, MPH  [name] 05/17/2011, 1:57 AM

## 2011-05-17 NOTE — Progress Notes (Signed)
Pt placed on brown and orange isolation.  Pt needs stool sample sent to r/o cdiff.  No family at the bedside at this time.  Pt is intubated and sedated. IP notification sent.

## 2011-05-17 NOTE — ED Notes (Signed)
Pt's daughter: Alfredo Batty  Cell: [phone] Home: [phone]

## 2011-05-17 NOTE — Progress Notes (Signed)
ANTIBIOTIC CONSULT NOTE - INITIAL  Pharmacy Consult for vancomycin and zosyn  Indication: GIB and possible sepsis  No Known Allergies  Patient Measurements: Height: 4\' 11"  (149.9 cm) Weight: 99 lb 3.3 oz (45 kg) IBW/kg (Calculated) : 43.2  Adjusted Body Weight:   Vital Signs: Temp: 93.7 F (34.3 C) (11/25 0056) Temp src: Rectal (11/24 2230) BP: 85/60 mmHg (11/25 0056) Pulse Rate: 60  (11/25 0056) Intake/Output from previous day: 11/24 0701 - 11/25 0700 In: 12.5 [Blood:12.5] Out: -  Intake/Output from this shift: Total I/O In: 12.5 [Blood:12.5] Out: -   Labs:  [facility] 05/16/11 2303  WBC 10.4  HGB 9.1*  PLT 112*  LABCREA --  CREATININE 2.36*   Estimated Creatinine Clearance: 14 ml/min (by C-G formula based on Cr of 2.36). No results found for this basename: VANCOTROUGH:2,VANCOPEAK:2,VANCORANDOM:2,GENTTROUGH:2,GENTPEAK:2,GENTRANDOM:2,TOBRATROUGH:2,TOBRAPEAK:2,TOBRARND:2,AMIKACINPEAK:2,AMIKACINTROU:2,AMIKACIN:2, in the last 72 hours   Microbiology: No results found for this or any previous visit (from the past 720 hour(s)).  Medical History: Past Medical History  Diagnosis Date  . Abdominal pain   . Hypothyroid   . Diabetes mellitus     Medications:  Prescriptions prior to admission  Medication Sig Dispense Refill  . dextrose 50 % solution Inject 25 g into the vein once.        . ondansetron in dextrose solution Inject 4 mg into the vein once.         Assessment: 77 yo found unresponsive by family in hypoglycemic state with coffee ground emeis on self and furniture. vanc and zoysn empirically until r/o sepsis pend possible or visit for EGD    Goal of Therapy:  Vancomycin trough level 15-20 mcg/ml renal adjust zosyn   Plan: zosyn 3.375 stat available in ED pyxis at 0045 (was not administered despite possible sepsis) give that dose asap. Then 2.25 gm q8h over 30 minutes. 1gm pre-op dose ordered. This will be sufficient coverage for 48 hours. Suggest giving  now. Will f/u post op. And folliow bmets     Janice Coffin 05/17/2011,2:08 AM

## 2011-05-17 NOTE — Procedures (Signed)
Central Venous Catheter Insertion Procedure Note SANORA CUNANAN [id number] October 15, 1933  Procedure: Insertion of Central Venous Catheter Indications: pressor admin  Initial attempt at L subclavian unsuccessful (unable to pass wire) and patient writhing, so converted to L IJ under direct ultrasound guidance.  Procedure Details Consent: Performed with daughter, documented in chart. Time Out: Verified patient identification, verified procedure, site/side was marked, verified correct patient position, special equipment/implants available, medications/allergies/relevent history reviewed, required imaging and test results available.  Performed  Maximum sterile technique was used including antiseptics, cap, gloves, gown, hand hygiene, mask and sheet. Skin prep: Chlorhexidine; local anesthetic administered A antimicrobial bonded/coated triple lumen catheter was placed in the left internal jugular vein using the Seldinger technique.  Evaluation Blood flow good Complications: No apparent complications Patient did tolerate procedure well. Chest X-ray ordered to verify placement.  CXR: pending.  Joie Bimler MD, MPH   Jemmie Ledgerwood 05/17/2011, 1:59 AM

## 2011-05-17 NOTE — Interval H&P Note (Signed)
History and Physical Interval Note:   05/17/2011   3:24 PM   [name]  has presented today for surgery, with the diagnosis of gi bleed, coffee ground emesis  The various methods of treatment have been discussed with the patient and family. After consideration of risks, benefits and other options for treatment, the patient has consented to  Procedure(s): ESOPHAGOGASTRODUODENOSCOPY (EGD) as a surgical intervention .  The patients' history has been reviewed, patient examined, no change in status, stable for surgery.  I have reviewed the patients' chart and labs.  Questions were answered to the patient's satisfaction.     Lina Sar  MD

## 2011-05-17 NOTE — ED Provider Notes (Signed)
History     CSN: [id number] Arrival date & time: 05/16/2011 10:07 PM   First MD Initiated Contact with Patient 05/16/11 2221      Chief Complaint  Patient presents with  . Altered Mental Status  . Hypotension    (Consider location/radiation/quality/duration/timing/severity/associated sxs/prior treatment) HPI Level 5 caveat AMS 77 -year-old female history of alcohol abuse, diabetes, hypothyroidism presents with multiple complaints. The patient was found down at home by her family members with what was thought to be coffee-ground emesis on her clothes an on her furniture. She had altered mental status per the family. The patient is not able to provide a good history. Her EMS on arrival her glucose was 23. She was given an amp of 50 prior to arrival. The patient is alert but nauseated and not fully answering questions.  Per her family she does have a history of alcohol abuse.  [name], [place] 05/16/2011 22:10  Per EMS - pt from home - pt was unresponsive found on the couch, coffee ground emesis noted all over pt, pt was incontinent of urine - pt's initial CBG 23 - pt received D50 by EMS - EMS obtained sys BP of 60 and was administered approx of fliud - last BP 80 sys.Pt alert and confused.   Past Medical History  Diagnosis Date  . Abdominal pain   . Hypothyroid   . Diabetes mellitus     History reviewed. No pertinent past surgical history.  History reviewed. No pertinent family history.  History  Substance Use Topics  . Smoking status: Not on file  . Smokeless tobacco: Not on file  . Alcohol Use:     OB History    Grav Para Term Preterm Abortions TAB SAB Ect Mult Living                  Review of Systems  Unable to perform ROS   Allergies  Review of patient's allergies indicates no known allergies.  Home Medications   Current Outpatient Rx  Name Route Sig Dispense Refill  . DEXTROSE 50 % IV SOLN Intravenous Inject 25 g into the vein once.       Marland Kitchen ONDANSETRON 1 MG/ML INJECTION Intravenous Inject 4 mg into the vein once.        BP 68/42  Pulse 87  Temp(Src) 91.6 F (33.1 C) (Rectal)  Resp 21  Ht 4\' 11"  (1.499 m)  Wt 99 lb 3.3 oz (45 kg)  BMI 20.04 kg/m2  SpO2 97%  Physical Exam  Nursing note and vitals reviewed. Constitutional: She is oriented to person, place, and time. She appears well-developed.  HENT:  Head: Atraumatic.       Coffee-ground emesis in mouth  Eyes: Conjunctivae and EOM are normal. Pupils are equal, round, and reactive to light.  Neck: Normal range of motion. Neck supple.  Cardiovascular: Normal rate, regular rhythm, normal heart sounds and intact distal pulses.   Pulmonary/Chest: Effort normal and breath sounds normal. No respiratory distress. She has no wheezes. She has no rales.  Abdominal: Soft. She exhibits distension. There is tenderness. There is no rebound and no guarding.       There is epi gastric tenderness to palpation Distention  Genitourinary:       Bright red blood per rectum without active bleeding  Musculoskeletal: Normal range of motion.       Lower extremities cool to the touch  Neurological: She is alert and oriented to person, place, and time.  Unable to cooperate with neuro exam  Skin: Skin is warm and dry. No rash noted.  Psychiatric: She has a normal mood and affect.    Date: 05/17/2011  Rate: 86  Rhythm: normal sinus rhythm  QRS Axis: normal  Intervals: normal  ST/T Wave abnormalities: nonspecific T wave changes  Conduction Disutrbances:none  Narrative Interpretation:   Old EKG Reviewed: unchanged Anterior q waves   ED Course  Procedures (including critical care time)  CRITICAL CARE Performed by: Forbes Cellar   Total critical care time:  Critical care time was exclusive of separately billable procedures and treating other patients.  Critical care was necessary to treat or prevent imminent or life-threatening deterioration.  Critical care  was time spent personally by me on the following activities: development of treatment plan with patient and/or surrogate as well as nursing, discussions with consultants, evaluation of patient's response to treatment, examination of patient, obtaining history from patient or surrogate, ordering and performing treatments and interventions, ordering and review of laboratory studies, ordering and review of radiographic studies, pulse oximetry and re-evaluation of patient's condition.   Labs Reviewed  GLUCOSE, CAPILLARY - Abnormal; Notable for the following:    Glucose-Capillary 203 (*)    All other components within normal limits  CBC - Abnormal; Notable for the following:    RBC 2.69 (*)    Hemoglobin 9.1 (*)    HCT 28.7 (*)    MCV 106.7 (*)    Platelets 112 (*)    All other components within normal limits  DIFFERENTIAL - Abnormal; Notable for the following:    Monocytes Relative 1 (*)    All other components within normal limits  COMPREHENSIVE METABOLIC PANEL - Abnormal; Notable for the following:    Sodium 158 (*)    CO2 5 (*)    Glucose, Bld 164 (*)    BUN 25 (*)    Creatinine, Ser 2.36 (*)    Calcium 6.4 (*)    Total Protein 5.0 (*)    Albumin 2.7 (*)    AST 126 (*) HEMOLYSIS AT THIS LEVEL MAY AFFECT RESULT   Total Bilirubin 2.0 (*)    GFR calc non Af Amer 19 (*)    GFR calc Af Amer 22 (*)    All other components within normal limits  LACTIC ACID, PLASMA - Abnormal; Notable for the following:    Lactic Acid, Venous 18.8 (*)    All other components within normal limits  PROTIME-INR - Abnormal; Notable for the following:    Prothrombin Time 23.1 (*)    INR 2.01 (*)    All other components within normal limits  LIPASE, BLOOD - Abnormal; Notable for the following:    Lipase 201 (*)    All other components within normal limits  TYPE AND SCREEN  APTT  PREPARE RBC (CROSSMATCH)  ABO/RH  POCT CBG MONITORING  CULTURE, BLOOD (ROUTINE X 2)  CULTURE, BLOOD (ROUTINE X 2)    URINALYSIS, ROUTINE W REFLEX MICROSCOPIC  CULTURE, BLOOD (ROUTINE X 2)  CULTURE, BLOOD (ROUTINE X 2)   Dg Chest Portable 1 View  05/16/2011  *RADIOLOGY REPORT*  Clinical Data: Altered level of consciousness; hypotension.  PORTABLE CHEST - 1 VIEW  Comparison: Chest radiograph performed 01/30/2011  Findings: The lungs are well-aerated and clear.  There is no evidence of focal opacification, pleural effusion or pneumothorax.  The cardiomediastinal silhouette is mildly enlarged.  No acute osseous abnormalities are seen.  IMPRESSION: No acute cardiopulmonary process seen; mild cardiomegaly again  noted.  Original Report Authenticated By: Tonia Ghent, M.D.     1. GI bleed   2. Hypotension   3. Altered mental status   4. Hypothermia   5. Hypoglycemia     MDM  Unstable UGIB. Labs as above. PCCM consulted on her arrival. Large bore IV, IVF, protonix. Admit to ICU. D/W Dr. Juanda Chance, GI. Per family, pt is full code. Transfuse, warm, monitor glu. Admit for further w/u and evaluation.  Stefano Gaul, MD          Forbes Cellar, MD 05/17/11 [phone]

## 2011-05-17 NOTE — Progress Notes (Signed)
eLink Physician-Brief Progress Note Patient Name: CIELA MAHAJAN DOB: 1934-04-13 MRN: [id number]  Date of Service  05/17/2011   HPI/Events of Note   preented to ICU, no distress Ine noted Have had d/w Dr Saunders Revel about clinical status  eICU Interventions  Bicarb 5, assess abg x 1 ffp to 2 units May use line   Intervention Category Intermediate Interventions: Bleeding - evaluation and treatment with blood products  Keron Koffman J. 05/17/2011, 2:04 AM

## 2011-05-17 NOTE — Progress Notes (Signed)
CRITICAL VALUE ALERT  Critical value received:  Calcium 5.5 and Potassium 2.6  Date of notification:  05/17/11  Time of notification:  1830  Critical value read back:yes  Nurse who received alert:  Scruggs, Alan Ripper   MD notified (1st page):  Dr. Junious Dresser  Time of first page:  1831  MD notified (2nd page):  Time of second page:  Responding MD:  Dr. Junious Dresser  Time MD responded:  [phone]

## 2011-05-17 NOTE — Procedures (Signed)
Intubation Note:  Indication: shock, severe lactic acidosis  preox c 100% bag mask, 6mg  etomidate, sniffing position, mac 3 with grade 1 view, visualized 8.0 tube through cords, +CO2 color change, +tube fog, +bilat breath sounds, BP and SpO2 stable throughout, tube secured ~23cm  CXR pending  Joie Bimler MD, MPH

## 2011-05-17 NOTE — Consult Note (Signed)
I have personally reviewed the above note and agree with the plan of care. Jacoba Cherney, MD 

## 2011-05-17 NOTE — H&P (View-Only) (Signed)
Received request for consult in this pt with severe metabolic derangements due to alcohol abuse. She was seen by GI during her hospitalization for N&V and alcohol related problems in May 2012 and Aug. 2012. EGD by Dr Hung  2011 for dysphagia, found no varices. Barium esophagram 10/2010  confirmed severe es. Spasm but , no stricture.We will see her for further evaluation and possible EGD pending stabilization in ICU. 

## 2011-05-17 NOTE — Procedures (Signed)
Arterial Catheter Insertion Procedure Note [name] [id number] December 31, 1933  Procedure: Insertion of Arterial Catheter  Indications: Blood pressure monitoring  Procedure Details Consent: Unable to obtain consent because of altered level of consciousness. Time Out: Verified patient identification, verified procedure, site/side was marked, verified correct patient position, special equipment/implants available, medications/allergies/relevent history reviewed, required imaging and test results available.  Performed  Maximum sterile technique was used including antiseptics, gloves, gown, hand hygiene and mask. Skin prep: Chlorhexidine; local anesthetic administered 20 gauge catheter was inserted into right radial artery using the Seldinger technique.  Evaluation Blood flow good; BP tracing good. Complications: No apparent complications.   Leafy Half 05/17/2011

## 2011-05-17 NOTE — Op Note (Signed)
Results of EGD: severe erosive gastritis and esophagitis, NO es or gastric varices, Bx's to r/o H.Pylori

## 2011-05-17 NOTE — Consult Note (Signed)
[facility] Consult: 8:31 AM 05/17/2011   Referring Provider:  Forbes Cellar Primary Care Physician:  No primary provider on file. Primary Gastroenterologist:  Seen by Dr Elnoria Howard in 2011  Reason for Consultation:  [place] emsis.  HPI: [name] is an alcoholic, diabetic 77 y.o. female.  Hx repeated non compliance with Meds and alcohol abstinence.  Found down at home confused, surrounded by cg emesis.  Admitted with hypotension, severe lactic acidosis, metabolic derangements with ARF.  In field EMS treated glucose of 23 with D50.  She is intubated to protect airway, on pressors, octreotide, protonix.    Past Medical History  Diagnosis Date  . Abdominal pain   . Hypothyroid   . Diabetes mellitus   . COPD (chronic obstructive pulmonary disease)   . GERD (gastroesophageal reflux disease)     Past Surgical History  Procedure Date  . Abdominal hysterectomy     Prior to Admission medications   Medication Sig Start Date End Date Taking? Authorizing Provider  dextrose 50 % solution Inject 25 g into the vein once.     Yes Historical Provider, MD  levothyroxine (SYNTHROID, LEVOTHROID) 75 MCG tablet Take 75 mcg by mouth daily.     Yes Historical Provider, MD  nateglinide (STARLIX) 120 MG tablet Take 120 mg by mouth 3 (three) times daily before meals.     Yes Historical Provider, MD  ondansetron in dextrose solution Inject 4 mg into the vein once.     Yes Historical Provider, MD  potassium chloride (KLOR-CON) 20 MEQ packet Take 20 mEq by mouth 2 (two) times daily.     Yes Historical Provider, MD    Scheduled Meds:    . dextrose      . etomidate  6 mg Intravenous Once  . haloperidol lactate  5 mg Intravenous Once  . norepinephrine (LEVOPHED) Adult infusion  2-50 mcg/min Intravenous To Major  . ondansetron            . pantoprozole (PROTONIX) infusion  8 mg/hr Intravenous To Major              . piperacillin-tazobactam (ZOSYN)   IV  2.25 g Intravenous Q8H  . piperacillin-tazobactam  3.375 g Intravenous STAT  . potassium chloride  10 mEq Intravenous Q1 Hr x 2  . sodium chloride  1,000 mL Intravenous Once  . sodium chloride  1,000 mL Intravenous Once  . sodium chloride  1,000 mL Intravenous Once  . vancomycin  1,000 mg Intravenous To OR                     Infusions: Levophed Octreotide Versed  Protonix Fentanyl Sodium Bicarb   Allergies as of 05/16/2011  . (No Known Allergies)    Family History  Problem Relation Age of Onset  . Stroke Mother     History   Social History  . Marital Status: Divorced    Spouse Name: N/A    Number of Children: N/A  . Years of Education: N/A   Occupational History  . Not on file.   Social History Main Topics  . Smoking status: Smoker, Current Status Unknown    Types: Cigarettes  . Smokeless tobacco: Not on file  . Alcohol Use: Yes     Uknown How much, daughter thinks she drinks heavy  . Drug Use: No  . Sexually Active: No   Other Topics Concern  . Not on file   Social History Narrative  . No narrative  on file    REVIEW OF SYSTEMS:  Unable to obtain from previous notes or intubated pt.   PHYSICAL EXAM: Vital signs in last 24 hours: Temp:  [85.6 F (29.8 C)-102 F (38.9 C)] 97 F (36.1 C) (11/25 0745) Pulse Rate:  [60-128] 124  (11/25 0745) Resp:  [11-35] 35  (11/25 0745) BP: (68-131)/(42-62) 98/53 mmHg (11/25 0721) SpO2:  [89 %-100 %] 96 % (11/25 0745) Arterial Line BP: (92-151)/(44-63) 109/48 mmHg (11/25 0745) FiO2 (%):  [50 %-60 %] 50 % (11/25 0721) Weight:  [45 kg (99 lb 3.3 oz)-49.3 kg (108 lb 11 oz)] 108 lb 11 oz (49.3 kg) (11/25 0244)  General: Chronically, acutely ill looking.  Pale AAF. Head:  Atraumatic  Eyes:  Conjunctival pink  Nose: No nasal discharge or bleeding Mouth:  OG and ETT tube in place Neck:  No JVD Lungs:  Clear in front, Intubated Heart: Tachy, regular. Abdomen:  Tense, distended, BS hypoactive. ?Tender LUQ?    Rectal: Friable ext hemorrhoids that are bleeding when touched. Stool is medium brown, not melenic.  All combined she is FOB+   Musc/Skeltl: Some muscular wasting on LE Extremities:  No pedal edema  Neurologic:  Unresponsive to my exam but does respond to noxious stimuli.  No tremor  Skin:  No angiomata seen on trunk Tattoos:  none  Intake/Output from previous day: 11/24 0701 - 11/25 0700 In: 728.2 [I.V.:365.7; Blood:362.5] Out: 670 [Urine:20; Emesis/NG output:650]  OG tube output recorded 650 cc so far.   LAB RESULTS:  Basename 05/17/11 0500 05/16/11 2303  WBC 5.1 10.4  HGB 12.8 9.1*  HCT 38.0 28.7*  PLT 78* 112*   BMET Lab Results  Component Value Date   NA 155* 05/17/2011   NA 158* 05/16/2011   NA 136 02/01/2011   K 3.0* 05/17/2011   K 3.7 05/16/2011   K 4.8 02/01/2011   CL 117* 05/17/2011   CL 104 05/16/2011   CL 103 02/01/2011   CO2 9* 05/17/2011   CO2 5* 05/16/2011   CO2 22 02/01/2011   GLUCOSE 165* 05/17/2011   GLUCOSE 164* 05/16/2011   GLUCOSE 105* 02/01/2011   BUN 19 05/17/2011   BUN 25* 05/16/2011   BUN 28* 02/01/2011   CREATININE 1.68* 05/17/2011   CREATININE 2.36* 05/16/2011   CREATININE 0.74 02/01/2011   CALCIUM 4.6* 05/17/2011   CALCIUM 6.4* 05/16/2011   CALCIUM 8.8 02/01/2011   LFT Lab Results  Component Value Date   PROT 5.0* 05/16/2011   PROT 6.9 01/31/2011   PROT 5.9* 11/08/2010   ALBUMIN 2.7* 05/16/2011   ALBUMIN 3.9 01/31/2011   ALBUMIN 2.6* 11/08/2010   AST 126* 05/16/2011   AST 102* 01/31/2011   AST 58 HEMOLYSIS AT THIS LEVEL MAY AFFECT RESULT* 11/08/2010   ALT 35 05/16/2011   ALT 44* 01/31/2011   ALT 26 11/08/2010   ALKPHOS 85 05/16/2011   ALKPHOS 86 01/31/2011   ALKPHOS 73 11/08/2010   BILITOT 2.0* 05/16/2011   BILITOT 0.6 01/31/2011   BILITOT 0.3 11/08/2010   PT/INR Lab Results  Component Value Date   INR 2.01* 05/16/2011   INR 1.23 01/31/2011   Drugs of Abuse     Component Value Date/Time   LABOPIA NONE DETECTED 01/31/2011 0530    COCAINSCRNUR NONE DETECTED 01/31/2011 0530   LABBENZ NONE DETECTED 01/31/2011 0530   AMPHETMU NONE DETECTED 01/31/2011 0530   THCU NONE DETECTED 01/31/2011 0530   LABBARB NONE DETECTED 01/31/2011 0530     No thyroid studies this  admit  RADIOLOGY STUDIES: Dg Chest Port 1 View  05/17/2011  *RADIOLOGY REPORT*  Clinical Data: Endotracheal tube placement.  PORTABLE CHEST - 1 VIEW  Comparison: Chest radiograph performed earlier today at 12:38 a.m.  Findings: The patient's endotracheal tube is seen ending 3 cm above the carina.  An enteric tube is noted extending into the stomach. A left IJ line is noted ending about the proximal SVC.  The lungs are relatively well expanded.  Vascular congestion is noted, with bilateral central airspace opacities, likely reflecting mild interstitial edema, similar in appearance to the prior study. No pleural effusion or pneumothorax is seen.  The cardiomediastinal silhouette is borderline enlarged.  No acute osseous abnormalities are identified.  IMPRESSION:  1.  Endotracheal tube seen ending 3 cm above the carina. 2.  Vascular congestion and borderline cardiomegaly, with bilateral central airspace opacities, likely reflecting relatively stable mild interstitial edema.  Original Report Authenticated By: Tonia Ghent, M.D.   Dg Chest Portable 1 View  05/17/2011  *RADIOLOGY REPORT*  Clinical Data: Left jugular line placement  PORTABLE CHEST - 1 VIEW  Comparison: Portable exam 0038 hours compared to 05/16/2011  Findings: Left jugular line, tip projecting over left SVC near/ at the azygos vein confluence. Mild enlargement of cardiac silhouette with pulmonary vascular congestion. Bilateral interstitial infiltrates, new, question pulmonary edema. No pneumothorax.  IMPRESSION: Tip of left jugular line projects over the SVC at or very near the azygos confluence. Enlargement of cardiac silhouette with pulmonary vascular congestion and question new pulmonary edema.  Original Report  Authenticated By: Lollie Marrow, M.D.   Dg Chest Portable 1 View  05/16/2011  *RADIOLOGY REPORT*  Clinical Data: Altered level of consciousness; hypotension.  PORTABLE CHEST - 1 VIEW  Comparison: Chest radiograph performed 01/30/2011  Findings: The lungs are well-aerated and clear.  There is no evidence of focal opacification, pleural effusion or pneumothorax.  The cardiomediastinal silhouette is mildly enlarged.  No acute osseous abnormalities are seen.  IMPRESSION: No acute cardiopulmonary process seen; mild cardiomegaly again noted.  Original Report Authenticated By: Tonia Ghent, M.D.       ESOPHOGRAM / BARIUM SWALLOW  11/10/2010:   Clinical data:  Dysphagia.  Sensation of a lump in the back of   the throat.  Belching.   Technique:  Combined double contrast and single contrast   examination performed using effervescent crystals, thick barium   liquid, and thin barium liquid.    Comparison: None.    Findings:  Patient swallowed the thick and thin barium liquid   without difficulty.  Initial swallows of thick barium liquid with   the patient erect showed severe spasm of the distal esophagus,   mimicking a high-grade stricture, with numerous tertiary esophageal   contractions as well.  Later imaging with the patient in the prone   oblique position during swallows of thin barium liquid showed no   residual spasm, and there are no fixed esophageal strictures or   masses.  Primary esophageal peristalsis was relatively well   preserved, with only slight breakup of the primary wave in the mid   esophagus.  A very small hiatal hernia is present with wide open   Schatzki's ring.  Gastroesophageal reflux was not elicited despite   use of the water siphon maneuver.  A 12.5 mm barium tablet easily   passed through the esophagus into the stomach without obstruction.    Evaluation of the pharynx during rapid sequence imaging with   swallows of thin barium liquid showed  spasm of the  cricopharyngeus   muscle without obstruction.  Trace laryngeal penetration occurred   but there is no evidence of tracheal aspiration.   IMPRESSION:    1.  Severe esophageal spasm.   2.  No fixed esophageal strictures or masses.   3.  Small hiatal hernia.  Gastroesophageal reflux was not elicited   with use of the water siphon maneuver.   4.  Very mild esophageal dysmotility.   5.  Spasm of the cricopharyngeus muscle.   6.  Trace laryngeal penetration without evidence of tracheal   aspiration. Original Report Authenticated By: Arnell Sieving, M.D.    ENDOSCOPIC STUDIES: Per prior notes: references to EGD done by Dr Elnoria Howard in 2011 showing HH, esoph stricture, tortuous esophagus.  IMPRESSION: 1.  CG emesis, r/o gastritis, PUD.  Doubt varices given no red blood observed. Currently on broad spectrum coverage:      drip PPI, drip Octreotide. 2. ARF with lactic acidosis. 3. ABL anemia, s/p 2 units PRBC.  MCV elevated c/w underlying alcoholism. 4.  Alcoholism, etoh level less than 11 at admission 5.  Coagulopathy.  S/p FFP x 1.  Has not received Vit K. 6.  ARF, intubated to protect airway.   PLAN: 1.  Continue current management.  EGD today   LOS: 1 day   [name]  05/17/2011, 8:31 AM Pager: [phone]

## 2011-05-17 NOTE — Progress Notes (Signed)
eLink Physician-Brief Progress Note Patient Name: [name] DOB: 1934-06-19 MRN: [id number]  Date of Service  05/17/2011   HPI/Events of Note  RN reports high sugars since bicarb drip started   eICU Interventions  Formulate bicarb drip in sterile water Change to ICU HG protocol -resistnat scale   Intervention Category Intermediate Interventions: Hyperglycemia - evaluation and treatment  ALVA,RAKESH V. 05/17/2011, 8:15 PM

## 2011-05-17 NOTE — Progress Notes (Signed)
Received request for consult in this pt with severe metabolic derangements due to alcohol abuse. She was seen by GI during her hospitalization for N&V and alcohol related problems in May 2012 and Aug. 2012. EGD by Dr Elnoria Howard  2011 for dysphagia, found no varices. Barium esophagram 10/2010  confirmed severe es. Spasm but , no stricture.We will see her for further evaluation and possible EGD pending stabilization in ICU.

## 2011-05-18 ENCOUNTER — Other Ambulatory Visit: Payer: Self-pay | Admitting: Internal Medicine

## 2011-05-18 ENCOUNTER — Inpatient Hospital Stay (HOSPITAL_COMMUNITY): Payer: Medicare Other

## 2011-05-18 DIAGNOSIS — N179 Acute kidney failure, unspecified: Secondary | ICD-10-CM

## 2011-05-18 DIAGNOSIS — D509 Iron deficiency anemia, unspecified: Secondary | ICD-10-CM

## 2011-05-18 DIAGNOSIS — K529 Noninfective gastroenteritis and colitis, unspecified: Secondary | ICD-10-CM

## 2011-05-18 DIAGNOSIS — K210 Gastro-esophageal reflux disease with esophagitis: Secondary | ICD-10-CM

## 2011-05-18 DIAGNOSIS — K920 Hematemesis: Secondary | ICD-10-CM

## 2011-05-18 DIAGNOSIS — R933 Abnormal findings on diagnostic imaging of other parts of digestive tract: Secondary | ICD-10-CM

## 2011-05-18 LAB — POCT I-STAT 3, ART BLOOD GAS (G3+)
O2 Saturation: 97 %
pCO2 arterial: 23.6 mmHg — ABNORMAL LOW (ref 35.0–45.0)
pH, Arterial: 7.527 — ABNORMAL HIGH (ref 7.350–7.400)
pO2, Arterial: 79 mmHg — ABNORMAL LOW (ref 80.0–100.0)

## 2011-05-18 LAB — GLUCOSE, CAPILLARY
Glucose-Capillary: 173 mg/dL — ABNORMAL HIGH (ref 70–99)
Glucose-Capillary: 76 mg/dL (ref 70–99)
Glucose-Capillary: 178 mg/dL — ABNORMAL HIGH (ref 70–99)

## 2011-05-18 LAB — BASIC METABOLIC PANEL
BUN: 30 mg/dL — ABNORMAL HIGH (ref 6–23)
BUN: 31 mg/dL — ABNORMAL HIGH (ref 6–23)
Calcium: 6.2 mg/dL — CL (ref 8.4–10.5)
Creatinine, Ser: 3.25 mg/dL — ABNORMAL HIGH (ref 0.50–1.10)
GFR calc Af Amer: 15 mL/min — ABNORMAL LOW (ref >90)
GFR calc non Af Amer: 15 mL/min — ABNORMAL LOW (ref >90)
GFR calc non Af Amer: 13 mL/min — ABNORMAL LOW (ref >90)
Glucose, Bld: 210 mg/dL — ABNORMAL HIGH (ref 70–99)
Sodium: 140 mEq/L (ref 135–145)

## 2011-05-18 LAB — PREPARE FRESH FROZEN PLASMA: Unit division: 0

## 2011-05-18 LAB — CORTISOL: Cortisol, Plasma: 21.4 ug/dL

## 2011-05-18 LAB — LIPID PANEL
Total CHOL/HDL Ratio: 8.4 RATIO
VLDL: UNDETERMINED mg/dL (ref 0–40)

## 2011-05-18 MED ORDER — POTASSIUM CHLORIDE 10 MEQ/50ML IV SOLN
INTRAVENOUS | Status: AC
  Administered 2011-05-18: 10 meq
  Filled 2011-05-18: qty 50

## 2011-05-18 MED ORDER — PRISMASOL BGK 4/2.5 32-4-2.5 MEQ/L IV SOLN
INTRAVENOUS | Status: DC
  Administered 2011-05-18 – 2011-05-22 (×8): via INTRAVENOUS_CENTRAL
  Filled 2011-05-18 (×11): qty 5000

## 2011-05-18 MED ORDER — SODIUM CHLORIDE 0.9 % IJ SOLN
INTRAMUSCULAR | Status: AC
  Filled 2011-05-18: qty 30

## 2011-05-18 MED ORDER — LEVOTHYROXINE SODIUM 75 MCG PO TABS
75.0000 ug | ORAL_TABLET | Freq: Every day | ORAL | Status: DC
  Administered 2011-05-19 – 2011-05-25 (×5): 75 ug
  Filled 2011-05-18 (×11): qty 1

## 2011-05-18 MED ORDER — M.V.I. ADULT IV INJ
10.0000 mL | INTRAVENOUS | Status: DC
  Administered 2011-05-18: 10 mL via INTRAVENOUS
  Filled 2011-05-18 (×2): qty 10

## 2011-05-18 MED ORDER — HYDROCORTISONE SOD SUCCINATE 100 MG IJ SOLR
50.0000 mg | Freq: Four times a day (QID) | INTRAMUSCULAR | Status: DC
  Administered 2011-05-18 – 2011-05-20 (×7): 50 mg via INTRAVENOUS
  Filled 2011-05-18 (×13): qty 1

## 2011-05-18 MED ORDER — SODIUM CHLORIDE 0.9 % IV SOLN
INTRAVENOUS | Status: DC
  Administered 2011-05-18 – 2011-05-23 (×4): via INTRAVENOUS
  Administered 2011-05-27: 20 mL/h via INTRAVENOUS
  Administered 2011-05-29 – 2011-05-31 (×2): via INTRAVENOUS

## 2011-05-18 MED ORDER — SODIUM CHLORIDE 0.9 % IV SOLN
1.0000 mg | Freq: Every day | INTRAVENOUS | Status: DC
  Administered 2011-05-18 – 2011-05-19 (×2): 1 mg via INTRAVENOUS
  Filled 2011-05-18 (×2): qty 0.2

## 2011-05-18 MED ORDER — THIAMINE HCL 100 MG/ML IJ SOLN
100.0000 mg | Freq: Every day | INTRAMUSCULAR | Status: DC
  Administered 2011-05-18 – 2011-05-19 (×2): 100 mg via INTRAVENOUS
  Filled 2011-05-18 (×2): qty 1

## 2011-05-18 MED ORDER — POTASSIUM CHLORIDE 10 MEQ/100ML IV SOLN: 10.0000 meq | INTRAVENOUS | Status: DC

## 2011-05-18 MED ORDER — POTASSIUM CHLORIDE 10 MEQ/50ML IV SOLN
10.0000 meq | INTRAVENOUS | Status: DC
  Administered 2011-05-18 (×2): 10 meq via INTRAVENOUS
  Filled 2011-05-18 (×3): qty 50

## 2011-05-18 MED ORDER — ALTEPLASE 2 MG IJ SOLR
2.0000 mg | Freq: Once | INTRAMUSCULAR | Status: AC | PRN
  Filled 2011-05-18: qty 2

## 2011-05-18 MED ORDER — VITAMIN K1 10 MG/ML IJ SOLN
10.0000 mg | Freq: Once | INTRAVENOUS | Status: AC
  Administered 2011-05-18: 10 mg via INTRAVENOUS
  Filled 2011-05-18: qty 1

## 2011-05-18 MED ORDER — PRISMASOL BGK 4/2.5 32-4-2.5 MEQ/L IV SOLN
INTRAVENOUS | Status: DC
  Administered 2011-05-18 – 2011-05-21 (×5): via INTRAVENOUS_CENTRAL
  Filled 2011-05-18 (×7): qty 5000

## 2011-05-18 MED ORDER — HEPARIN (PORCINE) IN NACL 2-0.9 UNIT/ML-% IJ SOLN: 1000.0000 mL | Freq: Once | INTRAMUSCULAR | Status: DC

## 2011-05-18 MED ORDER — PRISMASOL BGK 4/2.5 32-4-2.5 MEQ/L IV SOLN
INTRAVENOUS | Status: DC
  Administered 2011-05-18 – 2011-05-23 (×19): via INTRAVENOUS_CENTRAL
  Filled 2011-05-18 (×33): qty 5000

## 2011-05-18 MED ORDER — SODIUM CHLORIDE 0.9 % IJ SOLN
INTRAMUSCULAR | Status: AC
  Filled 2011-05-18: qty 40

## 2011-05-18 NOTE — Progress Notes (Signed)
  Echocardiogram 2D Echocardiogram has been performed.  [name] 05/18/2011, 3:34 PM

## 2011-05-18 NOTE — Progress Notes (Signed)
ANTIBIOTIC CONSULT NOTE - INITIAL  Pharmacy Consult for vancomycin and zosyn  Indication: GIB and possible sepsis  No Known Allergies  Patient Measurements: Height: 4\' 7"  (139.7 cm) Weight: 123 lb 14.4 oz (56.2 kg) IBW/kg (Calculated) : 34    Vital Signs: Temp: 99.7 F (37.6 C) (11/26 1300) Temp src: Core (Comment) (11/26 1200) BP: 99/76 mmHg (11/26 1300) Pulse Rate: 110  (11/26 1300) Intake/Output from previous day: 11/25 0701 - 11/26 0700 In: 7188 [I.V.:4435.5; Blood:312.5; NG/GT:770; IV Piggyback:1670] Out: 910 [Urine:10; Emesis/NG output:900] Intake/Output from this shift: Total I/O In: 945.6 [I.V.:745.6; IV Piggyback:200] Out: 0   Labs:  Basename 05/18/11 0500 05/17/11 1745 05/17/11 0500 05/17/11 0245 05/17/11 0200 05/16/11 2303  WBC -- 8.0 5.1 -- -- 10.4  HGB -- 13.0 12.8 -- -- 9.1*  PLT -- 52* 78* -- -- 112*  LABCREA -- -- -- 50.83 -- --  CREATININE 2.94* 2.56* -- -- 1.68* --   Estimated Creatinine Clearance: 11.2 ml/min (by C-G formula based on Cr of 2.94).    Microbiology: Recent Results (from the past 720 hour(s))  MRSA PCR SCREENING     Status: Normal   Collection Time   05/17/11  1:45 AM      Component Value Range Status Comment   MRSA by PCR NEGATIVE  NEGATIVE  Final   CULTURE, BLOOD (ROUTINE X 2)     Status: Normal (Preliminary result)   Collection Time   05/17/11  7:36 AM      Component Value Range Status Comment   Specimen Description BLOOD LEFT HAND   Final    Special Requests BOTTLES DRAWN AEROBIC ONLY 8CC   Final    Setup Time [id number]   Final    Culture     Final    Value:        BLOOD CULTURE RECEIVED NO GROWTH TO DATE CULTURE WILL BE HELD FOR 5 DAYS BEFORE ISSUING A FINAL NEGATIVE REPORT   Report Status PENDING   Incomplete   CULTURE, BLOOD (ROUTINE X 2)     Status: Normal (Preliminary result)   Collection Time   05/17/11  7:40 AM      Component Value Range Status Comment   Specimen Description BLOOD RIGHT WRIST ALINE   Final    Special Requests BOTTLES DRAWN AEROBIC AND ANAEROBIC 10CC   Final    Setup Time [id number]   Final    Culture     Final    Value:        BLOOD CULTURE RECEIVED NO GROWTH TO DATE CULTURE WILL BE HELD FOR 5 DAYS BEFORE ISSUING A FINAL NEGATIVE REPORT   Report Status PENDING   Incomplete     Medical History: Past Medical History  Diagnosis Date  . Abdominal pain   . Hypothyroid   . Diabetes mellitus   . COPD (chronic obstructive pulmonary disease)   . GERD (gastroesophageal reflux disease)     Medications:  Prescriptions prior to admission  Medication Sig Dispense Refill  . levothyroxine (SYNTHROID, LEVOTHROID) 75 MCG tablet Take 75 mcg by mouth daily.        . nateglinide (STARLIX) 120 MG tablet Take 120 mg by mouth 3 (three) times daily before meals.        . potassium chloride (KLOR-CON) 20 MEQ packet Take 20 mEq by mouth 2 (two) times daily.        Marland Kitchen DISCONTD: dextrose 50 % solution Inject 25 g into the vein once.        Marland Kitchen  DISCONTD: ondansetron in dextrose solution Inject 4 mg into the vein once.         Assessment: Vancomycin IV and PO, metronidazole and Zosyn continue for sepsis and colitis.  Renal failure persists.  Urine output only 10mL yesterday.  Blood cultures no growth to date.   Goal of Therapy:  Vancomycin trough level 15-20 mcg/ml renal adjust zosyn   Plan: Check Vancomycin trough in AM and redose if necessary.  Continue the same dose of Zosyn    Mickeal Skinner 05/18/2011,3:04 PM

## 2011-05-18 NOTE — Progress Notes (Signed)
Follow up - Critical Care Medicine Note  Patient Details:    [name] is an 77 y.o. female heavy drinker who family had not been in contact with for a day or so who was found down at home covered in coffee ground emesis. She aroused to family but did not stay alert so EMS was called. Apparently her SBP in the field was in the 60s and her glucose was in the 20s. In the ER she was altered, hypothermic, HYPOtensive, found to have AKI, [Na] ~160, and lactate ~19.   Lines, Airways, Drains: 11/25 L IJ >>>  11/25 Foley >>>  11/25 Radial arterial line >>>  11/25 NGT >>>  Anti-infectives:  11/25 Vancomycin >>> 11/25 Zosyn >>> 11/26 Flagyl>>>Until cdiff back  Other Medications: Insulin Potassium Sulcrafate Fentanyl Versed Protonix drip Bicarb drip  Microbiology:  Blood culture 05/17/11 >>> no growth to date  Results for orders placed during the hospital encounter of 05/16/11  MRSA PCR SCREENING     Status: Normal   Collection Time   05/17/11  1:45 AM      Component Value Range Status Comment   MRSA by PCR NEGATIVE  NEGATIVE  Final     Best Practice/Protocols:   Events: 05/17/11 intubation >>>  Studies: Ct Abdomen Pelvis Wo Contrast  05/17/2011  *RADIOLOGY REPORT*  Clinical Data: Sepsis.  Intra-abdominal abscess.  CT ABDOMEN AND PELVIS WITHOUT CONTRAST  Technique:  Multidetector CT imaging of the abdomen and pelvis was performed following the standard protocol without intravenous contrast.  Comparison: None.  Findings: Small bilateral pleural effusions are present. Consolidation is present in the right infrahilar region.  Dependent atelectasis is present.  Coronary artery atherosclerosis is present. If office based assessment of coronary risk factors has not been performed, it is now recommended.  Nasogastric tube is present which terminates at the antrum of the stomach.  Geographic fatty infiltration of the liver is present. Small amount of ascites surrounds the liver.   Old granulomatous disease of the spleen.  Low density lesion is present in the left adrenal gland, probably representing a small adenoma.  Aortic and visceral atherosclerosis.  Small infrarenal abdominal aortic aneurysm is fusiform and measures 22 mm.  The iliofemoral atherosclerosis is also present. Fatty atrophy of the pancreas. Biliary sludge is present.  No calcified gallstones are identified.  Foley catheter in the urinary bladder.  No small bowel obstruction. Diffuse marked colonic thickening, extending from rectum to cecum. In hospitalized patient, this most likely represent C difficile colitis however other forms of colitis can produce this appearance. The distribution would be unusual for ischemic colitis. Inflammatory bowel disease is always a consideration and colitis. Bilateral hip osteoarthritis is present.  Pubic symphysis degenerative disease.  Severe sclerosis on both sides of the SI joint with vacuum joint on the right.  This could be associated with seronegative spondyloarthropathies or ordinary degenerative change.  Multilevel lumbar facet arthrosis.    Small calcification is present in the right renal hilum, probably vascular rather than renal calculus.  IMPRESSION: 1.  Small bilateral pleural effusions with dependent atelectasis. 2.  Right infrahilar airspace consolidation suspicious for pneumonia or aspiration. 3.  Atherosclerosis and coronary artery disease. 4.  Fatty liver. 5.  Old granulomatous disease of the spleen. 6.  Diffuse pan colitis.  Question C difficile colitis versus inflammatory bowel disease. 7.  Destructive changes of the SI joints.  Question seronegative spondyloarthropathy.  This could also represent severe degenerative changes. 8.  3 cm infrarenal abdominal aortic  aneurysm.  Original Report Authenticated By: Andreas Newport, M.D.   Dg Chest Port 1 View  05/17/2011  *RADIOLOGY REPORT*  Clinical Data: Endotracheal tube placement.  PORTABLE CHEST - 1 VIEW  Comparison: Chest  radiograph performed earlier today at 12:38 a.m.  Findings: The patient's endotracheal tube is seen ending 3 cm above the carina.  An enteric tube is noted extending into the stomach. A left IJ line is noted ending about the proximal SVC.  The lungs are relatively well expanded.  Vascular congestion is noted, with bilateral central airspace opacities, likely reflecting mild interstitial edema, similar in appearance to the prior study. No pleural effusion or pneumothorax is seen.  The cardiomediastinal silhouette is borderline enlarged.  No acute osseous abnormalities are identified.  IMPRESSION:  1.  Endotracheal tube seen ending 3 cm above the carina. 2.  Vascular congestion and borderline cardiomegaly, with bilateral central airspace opacities, likely reflecting relatively stable mild interstitial edema.  Original Report Authenticated By: Tonia Ghent, M.D.   Dg Chest Portable 1 View  05/17/2011  *RADIOLOGY REPORT*  Clinical Data: Left jugular line placement  PORTABLE CHEST - 1 VIEW  Comparison: Portable exam 0038 hours compared to 05/16/2011  Findings: Left jugular line, tip projecting over left SVC near/ at the azygos vein confluence. Mild enlargement of cardiac silhouette with pulmonary vascular congestion. Bilateral interstitial infiltrates, new, question pulmonary edema. No pneumothorax.  IMPRESSION: Tip of left jugular line projects over the SVC at or very near the azygos confluence. Enlargement of cardiac silhouette with pulmonary vascular congestion and question new pulmonary edema.  Original Report Authenticated By: Lollie Marrow, M.D.   Dg Chest Portable 1 View  05/16/2011  *RADIOLOGY REPORT*  Clinical Data: Altered level of consciousness; hypotension.  PORTABLE CHEST - 1 VIEW  Comparison: Chest radiograph performed 01/30/2011  Findings: The lungs are well-aerated and clear.  There is no evidence of focal opacification, pleural effusion or pneumothorax.  The cardiomediastinal silhouette is  mildly enlarged.  No acute osseous abnormalities are seen.  IMPRESSION: No acute cardiopulmonary process seen; mild cardiomegaly again noted.  Original Report Authenticated By: Tonia Ghent, M.D.    Consults: Treatment Team:  Hart Carwin, MD   Subjective:    Overnight Issues: on pressors, renal failure  Objective:  Vital signs for last 24 hours: Temp:  [85.5 F (29.7 C)-102.2 F (39 C)] 100.4 F (38 C) (11/26 0800) Pulse Rate:  [35-143] 143  (11/26 0030) Resp:  [0-35] 35  (11/26 0800) BP: (69-120)/(38-73) 74/57 mmHg (11/26 0800) SpO2:  [76 %-100 %] 100 % (11/26 0800) Arterial Line BP: (78-128)/(46-74) 83/59 mmHg (11/26 0800) FiO2 (%):  [40 %-50 %] 50 % (11/26 0808) Weight:  [56.2 kg (123 lb 14.4 oz)] 123 lb 14.4 oz (56.2 kg) (11/26 0500)  Hemodynamic parameters for last 24 hours: CVP:  [9 mmHg-225 mmHg] 217 mmHg  Intake/Output from previous day: 11/25 0701 - 11/26 0700 In: 7188 [I.V.:4435.5; Blood:312.5; NG/GT:770; IV Piggyback:1670] Out: 910 [Urine:10; Emesis/NG output:900]  Intake/Output this shift:    Vent settings for last 24 hours: Vent Mode:  [-] PRVC FiO2 (%):  [40 %-50 %] 50 % Set Rate:  [35 bmp] 35 bmp Vt Set:  [400 mL] 400 mL PEEP:  [5 cmH20] 5 cmH20 Plateau Pressure:  [30 cmH20-38 cmH20] 30 cmH20  Physical Exam:  General:  Well nourished/developed appearance female on ventilator supports that is not arousable Head:  NCAT Eyes:  Eyes closed, bilateral yellowish discharge noted Ears:  Intact, no discharge Nose:  No discharge Throat:  Midline trachea, no JVD, no LAD Pulmonary:  Coarse-likely due to ventilator, no adventitious sounds CV:  RRR, no murmurs/rubs/gallops ABD:  (+)BS, distended, nontender, no guarding, no rebound EXT:  Generalized edema with SCD on board  Assessment/Plan:  1.  Upper GI bleed No active bleed noted Cbc good, in am  ppi Output bilous Rest bowels further  2.  Diffuse colitis Etiology not clear, differential includes  ischemic, autoimmune ( Si joint on CT), infectious, cdiff No stool since noted Send ESR, ANA, DSDNA, RF< compliment c3 c4 ABX, see below CT reviewed Await stool for cdiff ppi Echo r/o clot  3.  Respiratory failure Uncompensated but improved met acidosis Lactic clearing, Ph noted, repeat ABG in 2 pm  No role bicarb, see renal Keep on max MV, rate 35 pcxr with some increase edema  4.  Acute renal failure R/o ATN, likely No hydro on CT K wnl and low frmo bicarb, dc bicarb ( K wnl, lactic acid clearing) bmwet q6h Output low, flush foley Bladder pressure noted 25 , without vent compartment issues To NS at 75 With shock continued and no output, consider   Early cvvhd, will call renal  5.  DM II Dc d5 from bicarb follow  6.  Alcoholism/Fatty Liver Thiamine, folic, mvi At risk ETOH WD Versed NO CIWA in ICU in this condition...only physician driven treatment  7.  Shock/sepsis cvp noted, 10 Levophed to MAP goal 65 Cortisol now if not done, then add hydrocortisone Trop neg Limit use vaso with concerns ischemic bowel GI to determine needs flex sig   LOS: 2 days   Additional comments:   Parke Poisson 05/18/2011   Mcarthur Rossetti. Tyson Alias, MD, FACP Pgr: [phone] [facility]  *Care during the described time interval was provided by me and/or other providers on the critical care team.  I have reviewed this patient's available data, including medical history, events of note, physical examination and test results as part of my evaluation.

## 2011-05-18 NOTE — Progress Notes (Deleted)
Renal MD Kathrene Bongo notified about heparin on CRRT due to ? GI bleed and previous vit k infusion. Ordered to hold heparin. IF filter clots, start heparin.  Minus Liberty RN

## 2011-05-18 NOTE — Progress Notes (Addendum)
eLink Physician-Brief Progress Note Patient Name: [name] DOB: 07-02-1933 MRN: [id number]  Date of Service  05/18/2011   HPI/Events of Note   hypokalemia  eICU Interventions  Recheck BMP this am   Intervention Category  Minor Interventions: Electrolytes abnormality - evaluation and management  BYRUM,ROBERT S. 05/18/2011, 3:58 AM

## 2011-05-18 NOTE — Progress Notes (Signed)
[name] Daily Rounding Note 05/18/2011, 9:24 AM  SUBJECTIVE: Note CT finding of pan colitis.  No BMs, C Diff ordered, belly still distended. Still intubated.  Dr Donell Beers from surgery has seen her yesterday. Nearly anuric. OBJECTIVE: Intabate, looks critically ill.  Vital signs in last 24 hours: Temp:  [85.5 F (29.7 C)-102.2 F (39 C)] 100.4 F (38 C) (11/26 0800) Pulse Rate:  [35-143] 143  (11/26 0030) Resp:  [0-35] 35  (11/26 0800) BP: (69-120)/(38-73) 74/57 mmHg (11/26 0800) SpO2:  [76 %-100 %] 100 % (11/26 0800) Arterial Line BP: (78-128)/(53-74) 83/59 mmHg (11/26 0800) FiO2 (%):  [40 %-50 %] 50 % (11/26 0808) Weight:  [56.2 kg (123 lb 14.4 oz)] 123 lb 14.4 oz (56.2 kg) (11/26 0500) ENT: lips swollen, ETT tube in place. NGT with green bilious fluid. Heart: Tachy, regular rhythm Chest: Rough BS, ETT tube/vent in place Abdomen: Distended, quiet, no masses.  Not tender but pt is sedated.  Extremities: No edema Neuro/Psych:  Sedated, unresponsive.  Intake/Output from previous day: 11/25 0701 - 11/26 0700 In: 7188 [I.V.:4435.5; Blood:312.5; NG/GT:770; IV Piggyback:1670] Out: 910 [Urine:10; Emesis/NG output:900]  Intake/Output this shift: Total I/O In: 282.5 [I.V.:132.5; IV Piggyback:150] Out: 0   Lab Results:  Basename 05/17/11 1745 05/17/11 0500 05/16/11 2303  WBC 8.0 5.1 10.4  HGB 13.0 12.8 9.1*  HCT 37.6 38.0 28.7*  PLT 52* 78* 112*   BMET  Basename 05/18/11 0500 05/17/11 1745 05/17/11 0200  NA 140 142 155*  K 2.9* 2.6* 3.0*  CL 100 103 117*  CO2 18* 14* 9*  GLUCOSE 210* 386* 165*  BUN 30* 26* 19  CREATININE 2.94* 2.56* 1.68*  CALCIUM 6.2* 5.5* 4.6*   LFT  Basename 05/16/11 2303  PROT 5.0*  ALBUMIN 2.7*  AST 126*  ALT 35  ALKPHOS 85  BILITOT 2.0*  BILIDIR --  IBILI --   PT/INR  Basename 05/17/11 1745 05/16/11 2303  LABPROT 19.4* 23.1*  INR 1.61* 2.01*    Studies/Results: Ct Abdomen Pelvis Wo Contrast  05/17/2011  *RADIOLOGY  REPORT*  Clinical Data: Sepsis.  Intra-abdominal abscess.  CT ABDOMEN AND PELVIS WITHOUT CONTRAST  Technique:  Multidetector CT imaging of the abdomen and pelvis was performed following the standard protocol without intravenous contrast.  Comparison: None.  Findings: Small bilateral pleural effusions are present. Consolidation is present in the right infrahilar region.  Dependent atelectasis is present.  Coronary artery atherosclerosis is present. If office based assessment of coronary risk factors has not been performed, it is now recommended.  Nasogastric tube is present which terminates at the antrum of the stomach.  Geographic fatty infiltration of the liver is present. Small amount of ascites surrounds the liver.  Old granulomatous disease of the spleen.  Low density lesion is present in the left adrenal gland, probably representing a small adenoma.  Aortic and visceral atherosclerosis.  Small infrarenal abdominal aortic aneurysm is fusiform and measures 22 mm.  The iliofemoral atherosclerosis is also present. Fatty atrophy of the pancreas. Biliary sludge is present.  No calcified gallstones are identified.  Foley catheter in the urinary bladder.  No small bowel obstruction. Diffuse marked colonic thickening, extending from rectum to cecum. In hospitalized patient, this most likely represent C difficile colitis however other forms of colitis can produce this appearance. The distribution would be unusual for ischemic colitis. Inflammatory bowel disease is always a consideration and colitis. Bilateral hip osteoarthritis is present.  Pubic symphysis degenerative disease.  Severe sclerosis on both sides  of the SI joint with vacuum joint on the right.  This could be associated with seronegative spondyloarthropathies or ordinary degenerative change.  Multilevel lumbar facet arthrosis.    Small calcification is present in the right renal hilum, probably vascular rather than renal calculus.  IMPRESSION: 1.  Small  bilateral pleural effusions with dependent atelectasis. 2.  Right infrahilar airspace consolidation suspicious for pneumonia or aspiration. 3.  Atherosclerosis and coronary artery disease. 4.  Fatty liver. 5.  Old granulomatous disease of the spleen. 6.  Diffuse pan colitis.  Question C difficile colitis versus inflammatory bowel disease. 7.  Destructive changes of the SI joints.  Question seronegative spondyloarthropathy.  This could also represent severe degenerative changes. 8.  3 cm infrarenal abdominal aortic aneurysm.  Original Report Authenticated By: Andreas Newport, M.D.   ASSESMENT: 1. CG emesis, resolved.  Severe esophagitis/gastritis on EGD 11/25.  On carafate, protonix drip.  2.  Diffuse colitis, Suspect low flow state/ischemia, r/o infectious cause but C Diff less likely given pt having no BMs.  On broad abx coverage: Zosyn, Vanco per tube.  3.  Resp failure  4.  Anuric renal failure, electrolyte derangements. 5.  DM2 on insulin. 6.  Alcoholism.   7.  Fatty liver. 8.  Anemia. Resolved s/p 3 units PRBC 9.  Shock/sepsis.  On levophed.   PLAN: 1.  Change to BID Protonix. 2.  Await CDiff assay.   LOS: 2 days   [name]  05/18/2011, 9:24 AM Pager: [phone]   Agree.

## 2011-05-18 NOTE — Consult Note (Signed)
Reason for Consult:oliguric ARF Referring Physician: Tyson Alias, MD  [name] is an 77 y.o. female.  HPI: 77yo AAF with PMH sig for DM, COPD, and Etoh abuse who was found down by family at home for unknown period of time.  Pt unresponsive and brought to [facility] ED and found to be hypotensive.  Pt was intubated and started on pressors.  W/u revealed ABLA, pancolitis on CT scan and ARF with creat of 2.36 and profound metabolic acidosis with CO2 of 5 and pH of 6.9.  We were consulted to further eval and manager her oliguric ARF.  Of note she has only had 30 cc of urine since admission  Trend in Creatinine: Creatinine, Ser  Date/Time Value Range Status  05/18/2011  5:00 AM 2.94* 0.50-1.10 (mg/dL) Final  16/03/9603  5:40 PM 2.56* 0.50-1.10 (mg/dL) Final     DELTA CHECK NOTED  05/17/2011  2:00 AM 1.68* 0.50-1.10 (mg/dL) Final  98/04/9146 82:95 PM 2.36* 0.50-1.10 (mg/dL) Final  12/10/3084  5:78 AM 0.74  0.50-1.10 (mg/dL) Final  4/69/6295 28:41 AM 1.68* 0.50-1.10 (mg/dL) Final  09/12/4008  2:72 AM 0.57 DELTA CHECK NOTED  0.4-1.2 (mg/dL) Final  5/36/6440  3:47 AM 1.02  0.4-1.2 (mg/dL) Final  10/14/9561 87:56 PM 1.38* 0.4-1.2 (mg/dL) Final  4/33/2951  8:84 PM 0.58  0.4-1.2 (mg/dL) Final    PMH:   Past Medical History  Diagnosis Date  . Abdominal pain   . Hypothyroid   . Diabetes mellitus   . COPD (chronic obstructive pulmonary disease)   . GERD (gastroesophageal reflux disease)     PSH:   Past Surgical History  Procedure Date  . Abdominal hysterectomy     Allergies: No Known Allergies  Medications:   Prior to Admission medications   Medication Sig Start Date End Date Taking? Authorizing Provider  levothyroxine (SYNTHROID, LEVOTHROID) 75 MCG tablet Take 75 mcg by mouth daily.     Yes Historical Provider, MD  nateglinide (STARLIX) 120 MG tablet Take 120 mg by mouth 3 (three) times daily before meals.     Yes Historical Provider, MD  potassium chloride (KLOR-CON) 20 MEQ packet  Take 20 mEq by mouth 2 (two) times daily.     Yes Historical Provider, MD    Discontinued Meds:   Medications Discontinued During This Encounter  Medication Reason  . pantoprazole (PROTONIX) 80 mg in sodium chloride 0.9 % 100 mL IVPB   . vancomycin (VANCOCIN) powder 1,000 mg Entry Error  . norepinephrine NICU IV Infusion 20 mcg/mL Entry Error  . free water 200 mL   . propofol (DIPRIVAN) 10 MG/ML infusion   . lactated ringers infusion   . vancomycin (VANCOCIN) IVPB 1000 mg/200 mL premix Dose change  . norepinephrine (LEVOPHED) 4 mg in dextrose 5 % 250 mL infusion Dose change  . ondansetron in dextrose solution   . dextrose 50 % solution   . octreotide (SANDOSTATIN) 2 mcg/mL in sodium chloride 0.9 % 250 mL infusion   . fentaNYL (SUBLIMAZE) 0.05 MG/ML injection Returned to ADS  . midazolam (VERSED) 10 MG/2ML injection Returned to ADS  . sodium bicarbonate 150 mEq in dextrose 5 % 1,000 mL infusion   . insulin aspart (novoLOG) injection 0-15 Units   . albuterol (PROVENTIL) (5 MG/ML) 0.5% nebulizer solution 2.5 mg   . potassium chloride 10 mEq in 100 mL IVPB Entry Error  . potassium chloride 10 mEq in 50 mL *CENTRAL LINE* IVPB   . sodium bicarbonate 150 mEq in sterile water 1,000 mL infusion  Social History:  reports that she has been smoking Cigarettes.  She does not have any smokeless tobacco history on file. She reports that she drinks alcohol. She reports that she does not use illicit drugs.  Family History:   Family History  Problem Relation Age of Onset  . Stroke Mother     Review of systems not obtained due to patient factors.  General appearance: intubated/sedated Eyes: +scleral edema, no icterus Resp: rhonchi bilaterally Cardio: tachy, no rubs GI: hypoactive bowel sounds, tense Extremities: edema edema of hands and feet Neurologic: Mental status: sedated  Labs: Basic Metabolic Panel:  Lab 05/18/11 1610 05/17/11 1745 05/17/11 0200 05/16/11 2303  NA 140 142 155*  158*  K 2.9* 2.6* 3.0* 3.7  CL 100 103 117* 104  CO2 18* 14* 9* 5*  GLUCOSE 210* 386* 165* 164*  BUN 30* 26* 19 25*  CREATININE 2.94* 2.56* 1.68* 2.36*  ALB -- -- -- --  CALCIUM 6.2* 5.5* 4.6* 6.4*  PHOS -- -- 7.4* --   Liver Function Tests:  Lab 05/16/11 2303  AST 126*  ALT 35  ALKPHOS 85  BILITOT 2.0*  PROT 5.0*  ALBUMIN 2.7*    Lab 05/16/11 2303  LIPASE 201*  AMYLASE --   No results found for this basename: AMMONIA:3 in the last 168 hours CBC:  Lab 05/17/11 1745 05/17/11 0500 05/16/11 2303  WBC 8.0 5.1 10.4  NEUTROABS -- -- 6.9  HGB 13.0 12.8 9.1*  HCT 37.6 38.0 28.7*  MCV 94.5 96.4 106.7*  PLT 52* 78* 112*   PT/INR: @labrcntip (inr:5) Cardiac Enzymes: No results found for this basename: CKTOTAL:5,CKMB:5,CKMBINDEX:5,TROPONINI:5 in the last 168 hours CBG:  Lab 05/18/11 1148 05/18/11 0744 05/18/11 0357 05/18/11 0026 05/17/11 2015  GLUCAP 76 173* 205* 256* 341*    Iron Studies: No results found for this basename: IRON:30,TIBC:30,TRANSFERRIN:30,FERRITIN:30 in the last 168 hours  Xrays/Other Studies: Ct Abdomen Pelvis Wo Contrast  05/17/2011  *RADIOLOGY REPORT*  Clinical Data: Sepsis.  Intra-abdominal abscess.  CT ABDOMEN AND PELVIS WITHOUT CONTRAST  Technique:  Multidetector CT imaging of the abdomen and pelvis was performed following the standard protocol without intravenous contrast.  Comparison: None.  Findings: Small bilateral pleural effusions are present. Consolidation is present in the right infrahilar region.  Dependent atelectasis is present.  Coronary artery atherosclerosis is present. If office based assessment of coronary risk factors has not been performed, it is now recommended.  Nasogastric tube is present which terminates at the antrum of the stomach.  Geographic fatty infiltration of the liver is present. Small amount of ascites surrounds the liver.  Old granulomatous disease of the spleen.  Low density lesion is present in the left adrenal gland,  probably representing a small adenoma.  Aortic and visceral atherosclerosis.  Small infrarenal abdominal aortic aneurysm is fusiform and measures 22 mm.  The iliofemoral atherosclerosis is also present. Fatty atrophy of the pancreas. Biliary sludge is present.  No calcified gallstones are identified.  Foley catheter in the urinary bladder.  No small bowel obstruction. Diffuse marked colonic thickening, extending from rectum to cecum. In hospitalized patient, this most likely represent C difficile colitis however other forms of colitis can produce this appearance. The distribution would be unusual for ischemic colitis. Inflammatory bowel disease is always a consideration and colitis. Bilateral hip osteoarthritis is present.  Pubic symphysis degenerative disease.  Severe sclerosis on both sides of the SI joint with vacuum joint on the right.  This could be associated with seronegative spondyloarthropathies or ordinary degenerative  change.  Multilevel lumbar facet arthrosis.    Small calcification is present in the right renal hilum, probably vascular rather than renal calculus.  IMPRESSION: 1.  Small bilateral pleural effusions with dependent atelectasis. 2.  Right infrahilar airspace consolidation suspicious for pneumonia or aspiration. 3.  Atherosclerosis and coronary artery disease. 4.  Fatty liver. 5.  Old granulomatous disease of the spleen. 6.  Diffuse pan colitis.  Question C difficile colitis versus inflammatory bowel disease. 7.  Destructive changes of the SI joints.  Question seronegative spondyloarthropathy.  This could also represent severe degenerative changes. 8.  3 cm infrarenal abdominal aortic aneurysm.  Original Report Authenticated By: Andreas Newport, M.D.   Dg Chest Port 1 View  05/17/2011  *RADIOLOGY REPORT*  Clinical Data: Endotracheal tube placement.  PORTABLE CHEST - 1 VIEW  Comparison: Chest radiograph performed earlier today at 12:38 a.m.  Findings: The patient's endotracheal tube is seen  ending 3 cm above the carina.  An enteric tube is noted extending into the stomach. A left IJ line is noted ending about the proximal SVC.  The lungs are relatively well expanded.  Vascular congestion is noted, with bilateral central airspace opacities, likely reflecting mild interstitial edema, similar in appearance to the prior study. No pleural effusion or pneumothorax is seen.  The cardiomediastinal silhouette is borderline enlarged.  No acute osseous abnormalities are identified.  IMPRESSION:  1.  Endotracheal tube seen ending 3 cm above the carina. 2.  Vascular congestion and borderline cardiomegaly, with bilateral central airspace opacities, likely reflecting relatively stable mild interstitial edema.  Original Report Authenticated By: Tonia Ghent, M.D.   Dg Chest Portable 1 View  05/17/2011  *RADIOLOGY REPORT*  Clinical Data: Left jugular line placement  PORTABLE CHEST - 1 VIEW  Comparison: Portable exam 0038 hours compared to 05/16/2011  Findings: Left jugular line, tip projecting over left SVC near/ at the azygos vein confluence. Mild enlargement of cardiac silhouette with pulmonary vascular congestion. Bilateral interstitial infiltrates, new, question pulmonary edema. No pneumothorax.  IMPRESSION: Tip of left jugular line projects over the SVC at or very near the azygos confluence. Enlargement of cardiac silhouette with pulmonary vascular congestion and question new pulmonary edema.  Original Report Authenticated By: Lollie Marrow, M.D.   Dg Chest Portable 1 View  05/16/2011  *RADIOLOGY REPORT*  Clinical Data: Altered level of consciousness; hypotension.  PORTABLE CHEST - 1 VIEW  Comparison: Chest radiograph performed 01/30/2011  Findings: The lungs are well-aerated and clear.  There is no evidence of focal opacification, pleural effusion or pneumothorax.  The cardiomediastinal silhouette is mildly enlarged.  No acute osseous abnormalities are seen.  IMPRESSION: No acute cardiopulmonary process  seen; mild cardiomegaly again noted.  Original Report Authenticated By: Tonia Ghent, M.D.   Blood pressure 98/65, pulse 108, temperature 100.2 F (37.9 C), temperature source Core (Comment), resp. rate 35, height 4\' 7"  (1.397 m), weight 56.2 kg (123 lb 14.4 oz), last menstrual period 05/17/1983, SpO2 100.00%.  Assessment/Plan: 1.  oliguric ARF- pt hypotensive on pressors after being found down.  Noted to have profound metabolic acidosis, ARF, and pancolitis, possibly septic.  Given oliguria/anuria, and electrolyte abnormalities, pt likely to worsen from renal standpoint.  Plan to initiate CRRT while pt remains on pressors. 2. VDRF- per PCCM 3. Metabolic acidosis- due to #1 and colitis.  Improved with bicarb drip, will start CRRT and replace that way as well 4. Colitis- on flagyl, zosyn, po vanco 5. SIRS- on pressors and broad spectrum abx 6. ABLA- s/p  transfusion 7. Etoh/fatty liver 8. UGI bleed- as above 9. Disposition- pt remains critically ill and will continue with aggressive therapy for now   Avey Mcmanamon A 05/18/2011, 4:10 PM

## 2011-05-18 NOTE — Procedures (Signed)
Central Venous Catheter Insertion Procedure Note - HD cathj  [name] [id number] [birth year]  Procedure: Insertion of Central Venous Catheter Indications: Hemodialysis , anuria, MODS Rt ij  15 cm cath Procedure Details Consent: Risks of procedure as well as the alternatives and risks of each were explained to the (patient/caregiver).  Consent for procedure obtained. Time Out: Verified patient identification, verified procedure, site/side was marked, verified correct patient position, special equipment/implants available, medications/allergies/relevent history reviewed, required imaging and test results available.  Performed  Maximum sterile technique was used including antiseptics, cap, gloves, gown, hand hygiene, mask and sheet. Skin prep: Chlorhexidine; local anesthetic administered A antimicrobial bonded/coated triple lumen catheter was placed in the right internal jugular vein using the Seldinger technique.  Evaluation Blood flow good Complications: No apparent complications Patient did tolerate procedure well. Chest X-ray ordered to verify placement.  CXR: pending.  biopatch US guidance Consent daughter Blood loss 2 cc  [name]. 05/18/2011, 4:40 PM

## 2011-05-18 NOTE — Progress Notes (Signed)
Renal MD contacted about holding CRRT heparin. Order received to hold heparin and re-start if filter clots. ? GI bleed and previous vit k infusion earlier in am.  Minus Liberty RN

## 2011-05-18 NOTE — Progress Notes (Signed)
CRITICAL VALUE ALERT  Critical value received:  Calcium 6  Date of notification:  05/18/11  Time of notification:  0648  Critical value read back:yes  Nurse who received alert:  Crist Fat RN  MD notified (1st page): Dr. Delton Coombes  Time of first page:  0650  MD notified (2nd page):  Time of second page:  Responding MD: Delton Coombes  Time MD responded:

## 2011-05-18 NOTE — Progress Notes (Signed)
1 Day Post-Op   Subjective: Sedated, on vent.  Objective: Vital signs in last 24 hours: Temp:  [99 F (37.2 C)-102.2 F (39 C)] 100.4 F (38 C) (11/26 1000) Pulse Rate:  [35-143] 110  (11/26 1100) Resp:  [0-35] 35  (11/26 1100) BP: (69-102)/(38-74) 99/74 mmHg (11/26 1100) SpO2:  [76 %-100 %] 100 % (11/26 1100) Arterial Line BP: (78-127)/(55-74) 106/63 mmHg (11/26 1100) FiO2 (%):  [50 %] 50 % (11/26 1200) Weight:  [56.2 kg (123 lb 14.4 oz)] 123 lb 14.4 oz (56.2 kg) (11/26 0500)    Intake/Output from previous day: 11/25 0701 - 11/26 0700 In: 7188 [I.V.:4435.5; Blood:312.5; NG/GT:770; IV Piggyback:1670] Out: 910 [Urine:10; Emesis/NG output:900] Intake/Output this shift: Total I/O In: 886.5 [I.V.:686.5; IV Piggyback:200] Out: 0   General appearance: Sedated, on vent Resp: rhonchi bilaterally Cardio: regular rate and rhythm GI: normal findings: bowel sounds normal and abnormal findings:  distended and mildly firm  Lab Results:   Basename 05/17/11 1745 05/17/11 0500  WBC 8.0 5.1  HGB 13.0 12.8  HCT 37.6 38.0  PLT 52* 78*   BMET  Basename 05/18/11 0500 05/17/11 1745  NA 140 142  K 2.9* 2.6*  CL 100 103  CO2 18* 14*  GLUCOSE 210* 386*  BUN 30* 26*  CREATININE 2.94* 2.56*  CALCIUM 6.2* 5.5*   PT/INR  Basename 05/17/11 1745 05/16/11 2303  LABPROT 19.4* 23.1*  INR 1.61* 2.01*   ABG  Basename 05/17/11 0847 05/17/11 0539  PHART 7.224* 7.162*  HCO3 8.6* 8.7*    Studies/Results: Ct Abdomen Pelvis Wo Contrast  05/17/2011  *RADIOLOGY REPORT*  Clinical Data: Sepsis.  Intra-abdominal abscess.  CT ABDOMEN AND PELVIS WITHOUT CONTRAST  Technique:  Multidetector CT imaging of the abdomen and pelvis was performed following the standard protocol without intravenous contrast.  Comparison: None.  Findings: Small bilateral pleural effusions are present. Consolidation is present in the right infrahilar region.  Dependent atelectasis is present.  Coronary artery  atherosclerosis is present. If office based assessment of coronary risk factors has not been performed, it is now recommended.  Nasogastric tube is present which terminates at the antrum of the stomach.  Geographic fatty infiltration of the liver is present. Small amount of ascites surrounds the liver.  Old granulomatous disease of the spleen.  Low density lesion is present in the left adrenal gland, probably representing a small adenoma.  Aortic and visceral atherosclerosis.  Small infrarenal abdominal aortic aneurysm is fusiform and measures 22 mm.  The iliofemoral atherosclerosis is also present. Fatty atrophy of the pancreas. Biliary sludge is present.  No calcified gallstones are identified.  Foley catheter in the urinary bladder.  No small bowel obstruction. Diffuse marked colonic thickening, extending from rectum to cecum. In hospitalized patient, this most likely represent C difficile colitis however other forms of colitis can produce this appearance. The distribution would be unusual for ischemic colitis. Inflammatory bowel disease is always a consideration and colitis. Bilateral hip osteoarthritis is present.  Pubic symphysis degenerative disease.  Severe sclerosis on both sides of the SI joint with vacuum joint on the right.  This could be associated with seronegative spondyloarthropathies or ordinary degenerative change.  Multilevel lumbar facet arthrosis.    Small calcification is present in the right renal hilum, probably vascular rather than renal calculus.  IMPRESSION: 1.  Small bilateral pleural effusions with dependent atelectasis. 2.  Right infrahilar airspace consolidation suspicious for pneumonia or aspiration. 3.  Atherosclerosis and coronary artery disease. 4.  Fatty liver. 5.  Old granulomatous disease of the spleen. 6.  Diffuse pan colitis.  Question C difficile colitis versus inflammatory bowel disease. 7.  Destructive changes of the SI joints.  Question seronegative spondyloarthropathy.   This could also represent severe degenerative changes. 8.  3 cm infrarenal abdominal aortic aneurysm.  Original Report Authenticated By: Andreas Newport, M.D.   Dg Chest Port 1 View  05/17/2011  *RADIOLOGY REPORT*  Clinical Data: Endotracheal tube placement.  PORTABLE CHEST - 1 VIEW  Comparison: Chest radiograph performed earlier today at 12:38 a.m.  Findings: The patient's endotracheal tube is seen ending 3 cm above the carina.  An enteric tube is noted extending into the stomach. A left IJ line is noted ending about the proximal SVC.  The lungs are relatively well expanded.  Vascular congestion is noted, with bilateral central airspace opacities, likely reflecting mild interstitial edema, similar in appearance to the prior study. No pleural effusion or pneumothorax is seen.  The cardiomediastinal silhouette is borderline enlarged.  No acute osseous abnormalities are identified.  IMPRESSION:  1.  Endotracheal tube seen ending 3 cm above the carina. 2.  Vascular congestion and borderline cardiomegaly, with bilateral central airspace opacities, likely reflecting relatively stable mild interstitial edema.  Original Report Authenticated By: Tonia Ghent, M.D.   Dg Chest Portable 1 View  05/17/2011  *RADIOLOGY REPORT*  Clinical Data: Left jugular line placement  PORTABLE CHEST - 1 VIEW  Comparison: Portable exam 0038 hours compared to 05/16/2011  Findings: Left jugular line, tip projecting over left SVC near/ at the azygos vein confluence. Mild enlargement of cardiac silhouette with pulmonary vascular congestion. Bilateral interstitial infiltrates, new, question pulmonary edema. No pneumothorax.  IMPRESSION: Tip of left jugular line projects over the SVC at or very near the azygos confluence. Enlargement of cardiac silhouette with pulmonary vascular congestion and question new pulmonary edema.  Original Report Authenticated By: Lollie Marrow, M.D.   Dg Chest Portable 1 View  05/16/2011  *RADIOLOGY REPORT*   Clinical Data: Altered level of consciousness; hypotension.  PORTABLE CHEST - 1 VIEW  Comparison: Chest radiograph performed 01/30/2011  Findings: The lungs are well-aerated and clear.  There is no evidence of focal opacification, pleural effusion or pneumothorax.  The cardiomediastinal silhouette is mildly enlarged.  No acute osseous abnormalities are seen.  IMPRESSION: No acute cardiopulmonary process seen; mild cardiomegaly again noted.  Original Report Authenticated By: Tonia Ghent, M.D.    Anti-infectives: Anti-infectives     Start     Dose/Rate Route Frequency Ordered Stop   05/18/11 0000   vancomycin (VANCOCIN) 50 mg/mL oral solution 500 mg        500 mg Per Tube 4 times per day 05/17/11 2235 05/31/11 2359   05/17/11 1200   vancomycin (VANCOCIN) IVPB 1000 mg/200 mL premix        1,000 mg 200 mL/hr over 60 Minutes Intravenous  Once 05/17/11 1053 05/17/11 1300   05/17/11 0600  piperacillin-tazobactam (ZOSYN) IVPB 2.25 g       2.25 g 100 mL/hr over 30 Minutes Intravenous 3 times per day 05/17/11 0205     05/17/11 0115   vancomycin (VANCOCIN) IVPB 1000 mg/200 mL premix  Status:  Discontinued        1,000 mg 200 mL/hr over 60 Minutes Intravenous To Surgery 05/17/11 0110 05/17/11 1053   05/17/11 0100   metroNIDAZOLE (FLAGYL) IVPB 500 mg        500 mg 100 mL/hr over 60 Minutes Intravenous Every 8 hours 05/17/11 2235 05/31/11 0059  05/17/11 0045   vancomycin (VANCOCIN) powder 1,000 mg  Status:  Discontinued        1,000 mg Other To Surgery 05/17/11 0030 05/17/11 0110   05/17/11 0045  piperacillin-tazobactam (ZOSYN) IVPB 3.375 g       3.375 g 100 mL/hr over 30 Minutes Intravenous STAT 05/17/11 0035 05/18/11 0045          Assessment/Plan: Colitis -- Final diagnosis not established yet, still awaiting C. Diff results. Nothing at this point that would indicate a surgical abdomen. WBC has been normal. Fevers up to 102+ yesterday but lower today. Will continue to follow.    LOS:  2 days    JEFFERY,MICHAEL J. 05/18/2011

## 2011-05-19 ENCOUNTER — Encounter (HOSPITAL_COMMUNITY): Payer: Self-pay | Admitting: Internal Medicine

## 2011-05-19 ENCOUNTER — Encounter: Payer: Self-pay | Admitting: Internal Medicine

## 2011-05-19 DIAGNOSIS — R933 Abnormal findings on diagnostic imaging of other parts of digestive tract: Secondary | ICD-10-CM

## 2011-05-19 DIAGNOSIS — N179 Acute kidney failure, unspecified: Secondary | ICD-10-CM

## 2011-05-19 DIAGNOSIS — K529 Noninfective gastroenteritis and colitis, unspecified: Secondary | ICD-10-CM

## 2011-05-19 DIAGNOSIS — J9600 Acute respiratory failure, unspecified whether with hypoxia or hypercapnia: Secondary | ICD-10-CM

## 2011-05-19 DIAGNOSIS — K920 Hematemesis: Secondary | ICD-10-CM

## 2011-05-19 DIAGNOSIS — K210 Gastro-esophageal reflux disease with esophagitis: Secondary | ICD-10-CM

## 2011-05-19 DIAGNOSIS — D509 Iron deficiency anemia, unspecified: Secondary | ICD-10-CM

## 2011-05-19 LAB — TYPE AND SCREEN
ABO/RH(D): A POS
Unit division: 0
Unit division: 0

## 2011-05-19 LAB — GLUCOSE, CAPILLARY
Glucose-Capillary: 113 mg/dL — ABNORMAL HIGH (ref 70–99)
Glucose-Capillary: 95 mg/dL (ref 70–99)
Glucose-Capillary: 93 mg/dL (ref 70–99)
Glucose-Capillary: 102 mg/dL — ABNORMAL HIGH (ref 70–99)

## 2011-05-19 LAB — POCT I-STAT 3, ART BLOOD GAS (G3+)
Acid-base deficit: 3 mmol/L — ABNORMAL HIGH (ref 0.0–2.0)
O2 Saturation: 97 %
Patient temperature: 36.8

## 2011-05-19 LAB — CBC
HCT: 34.8 % — ABNORMAL LOW (ref 36.0–46.0)
Hemoglobin: 12.5 g/dL (ref 12.0–15.0)
Hemoglobin: 12.3 g/dL (ref 12.0–15.0)
MCH: 32.6 pg (ref 26.0–34.0)
MCHC: 35.3 g/dL (ref 30.0–36.0)
MCV: 91.3 fL (ref 78.0–100.0)
Platelets: 15 10*3/uL — CL (ref 150–400)
RBC: 3.83 MIL/uL — ABNORMAL LOW (ref 3.87–5.11)
RDW: 18.9 % — ABNORMAL HIGH (ref 11.5–15.5)
WBC: 11.4 10*3/uL — ABNORMAL HIGH (ref 4.0–10.5)

## 2011-05-19 LAB — ANTI-DNA ANTIBODY, DOUBLE-STRANDED: ds DNA Ab: <1 IU/mL (ref <30)

## 2011-05-19 LAB — RENAL FUNCTION PANEL
BUN: 21 mg/dL (ref 6–23)
CO2: 22 mEq/L (ref 19–32)
CO2: 26 mEq/L (ref 19–32)
Calcium: 6.5 mg/dL — ABNORMAL LOW (ref 8.4–10.5)
Chloride: 103 mEq/L (ref 96–112)
Creatinine, Ser: 2.2 mg/dL — ABNORMAL HIGH (ref 0.50–1.10)
GFR calc Af Amer: 36 mL/min — ABNORMAL LOW (ref >90)
GFR calc non Af Amer: 31 mL/min — ABNORMAL LOW (ref >90)
Glucose, Bld: 103 mg/dL — ABNORMAL HIGH (ref 70–99)
Potassium: 4 mEq/L (ref 3.5–5.1)
Potassium: 3.7 mEq/L (ref 3.5–5.1)
Sodium: 139 mEq/L (ref 135–145)

## 2011-05-19 LAB — PROTIME-INR
INR: 1.42 (ref 0.00–1.49)
Prothrombin Time: 17.6 seconds — ABNORMAL HIGH (ref 11.6–15.2)

## 2011-05-19 LAB — C3 COMPLEMENT: C3 Complement: 85 mg/dL — ABNORMAL LOW (ref 90–180)

## 2011-05-19 LAB — RHEUMATOID FACTOR: Rhuematoid fact SerPl-aCnc: <10 IU/mL (ref <=14)

## 2011-05-19 LAB — VANCOMYCIN, RANDOM: Vancomycin Rm: 11 ug/mL

## 2011-05-19 MED ORDER — DEXTROSE 50 % IV SOLN
INTRAVENOUS | Status: AC
  Administered 2011-05-19: 50 mL via INTRAVENOUS
  Filled 2011-05-19: qty 50

## 2011-05-19 MED ORDER — OSMOLITE 1.5 CAL PO LIQD
1000.0000 mL | ORAL | Status: DC
  Administered 2011-05-19 – 2011-05-21 (×3): 1000 mL
  Filled 2011-05-19 (×6): qty 1000

## 2011-05-19 MED ORDER — FOLIC ACID 1 MG PO TABS
1.0000 mg | ORAL_TABLET | Freq: Every day | ORAL | Status: DC
  Administered 2011-05-19 – 2011-06-04 (×15): 1 mg via ORAL
  Filled 2011-05-19 (×18): qty 1

## 2011-05-19 MED ORDER — DEXTROSE 50 % IV SOLN
50.0000 mL | Freq: Once | INTRAVENOUS | Status: AC | PRN
  Administered 2011-05-19: 50 mL via INTRAVENOUS

## 2011-05-19 MED ORDER — SODIUM PHOSPHATE 3 MMOLE/ML IV SOLN
20.0000 mmol | Freq: Once | INTRAVENOUS | Status: AC
  Administered 2011-05-19: 20 mmol via INTRAVENOUS
  Filled 2011-05-19: qty 6.67

## 2011-05-19 MED ORDER — NEPRO/CARBSTEADY PO LIQD
1000.0000 mL | Freq: Every day | ORAL | Status: DC
  Filled 2011-05-19 (×2): qty 1000

## 2011-05-19 MED ORDER — LORAZEPAM 2 MG/ML IJ SOLN
0.5000 mg | Freq: Two times a day (BID) | INTRAMUSCULAR | Status: DC
  Administered 2011-05-19 (×2): 0.5 mg via INTRAVENOUS
  Filled 2011-05-19 (×2): qty 1

## 2011-05-19 MED ORDER — PANTOPRAZOLE SODIUM 40 MG PO PACK
40.0000 mg | PACK | Freq: Every day | ORAL | Status: DC
  Administered 2011-05-19 – 2011-05-25 (×5): 40 mg
  Filled 2011-05-19 (×9): qty 20

## 2011-05-19 MED ORDER — PRO-STAT SUGAR FREE PO LIQD
30.0000 mL | Freq: Every day | ORAL | Status: DC
  Administered 2011-05-19 – 2011-05-21 (×3): 30 mL
  Filled 2011-05-19 (×6): qty 30

## 2011-05-19 MED ORDER — OSMOLITE 1.5 CAL PO LIQD
1000.0000 mL | ORAL | Status: DC
  Filled 2011-05-19: qty 1000

## 2011-05-19 MED ORDER — VITAMIN B-1 100 MG PO TABS
100.0000 mg | ORAL_TABLET | Freq: Every day | ORAL | Status: DC
  Administered 2011-05-19 – 2011-06-04 (×15): 100 mg via ORAL
  Filled 2011-05-19 (×18): qty 1

## 2011-05-19 MED ORDER — MULTI-DELYN PO LIQD
5.0000 mL | Freq: Every day | ORAL | Status: DC
  Administered 2011-05-20 – 2011-05-26 (×5): 5 mL
  Filled 2011-05-19 (×7): qty 5

## 2011-05-19 MED ORDER — PIPERACILLIN-TAZOBACTAM 3.375 G IVPB 30 MIN
3.3750 g | Freq: Four times a day (QID) | INTRAVENOUS | Status: DC
  Administered 2011-05-19 – 2011-05-20 (×4): 3.375 g via INTRAVENOUS
  Filled 2011-05-19 (×6): qty 50

## 2011-05-19 NOTE — Progress Notes (Signed)
Subjective: Intubated, sedated, on NE for BP support  Objective: Vital signs in last 24 hours: Blood pressure 96/70, pulse 85, temperature 96.8 F (36 C), temperature source Core (Comment), resp. rate 24, height 4\' 7"  (1.397 m), weight 58.7 kg (129 lb 6.6 oz), last menstrual period 05/17/1983, SpO2 100.00%.   Intake/Output from previous day: 11/26 0701 - 11/27 0700 In: 4544.6 [I.V.:3840.6; NG/GT:90; IV Piggyback:614] Out: 2205 [Urine:9; Emesis/NG output:500] Intake/Output this shift: Total I/O In: 154.4 [I.V.:111.4; IV Piggyback:43] Out: 160 [Other:160]  PHYSICAL EXAM General--sedated Chest--rhonchi  Heart--no rub Abd--protuberant, nontender Extr--edema of arms and legs  Lab Results:   Lab 05/19/11 0412 05/18/11 1745 05/18/11 0500 05/17/11 0200  NA 139 141 140 --  K 4.0 3.1* 2.9* --  CL 103 102 100 --  CO2 22 19 18* --  BUN 21 31* 30* --  CREATININE 2.20* 3.25* 2.94* --  EGFR -- -- -- --  GLUCOSE 103* -- -- --  CALCIUM 6.3* 5.7* 6.2* --  PHOS 0.7* -- -- 7.4*      Basename 05/19/11 0412 05/17/11 1745  WBC 11.4* 8.0  HGB 12.5 13.0  HCT 34.7* 37.6  PLT 15* 52*    Scheduled: Continuous:  Assessment/Plan:   1. oliguric ARF- pt hypotensive on pressors--CVVH currently at -25 net UF per hr.  Will d/c NS IV 2. VDRF- per PCCM 3. Metabolic acidosis- off bicarb drip.  Improving with CVVH 4. Colitis- on flagyl, zosyn, po vanco 5. SIRS- on pressors and broad spectrum abx 6. ABLA- s/p transfusion 7. Etoh/fatty liver 8. UGI bleed- as above 9. Disposition- pt remains critically ill and will continue with aggressive therapy for now 10.   LOS: 3 days   Pesach Frisch F 05/19/2011,9:01 AM

## 2011-05-19 NOTE — Progress Notes (Signed)
CRITICAL VALUE ALERT  Critical value received:  Platelets 15, Calcium 6.3, Phos 0.7     Date of notification:  05/19/11  Time of notification:  0545  Critical value read back: yes    Nurse who received alert:  Crist Fat RN  MD notified (1st page):  Dr. Sharla Kidney person)  Time of first page:  NA  MD notified (2nd page):  Time of second page:  Responding MD:  Dr. Damita Lack)  Time MD responded:  [phone]

## 2011-05-19 NOTE — Progress Notes (Signed)
     [facility] Daily Rounding Note 05/19/2011, 8:33 AM  SUBJECTIVE: Now on CVVH.  Still oliguric.Note platelets of 15.  No stools in more than 48 hours.  Remains sedated on vent. Fever to 100.4 OBJECTIVE: Looks ill, edematous in face.  Vital signs in last 24 hours: Temp:  [96.8 F (36 C)-100.4 F (38 C)] 96.8 F (36 C) (11/27 0800) Pulse Rate:  [35-116] 85  (11/27 0800) Resp:  [21-37] 24  (11/27 0800) BP: (79-148)/(50-83) 96/70 mmHg (11/27 0800) SpO2:  [91 %-100 %] 100 % (11/27 0800) Arterial Line BP: (72-146)/(53-78) 101/66 mmHg (11/27 0800) FiO2 (%):  [39.7 %-50 %] 40 % (11/27 0803) Weight:  [58.7 kg (129 lb 6.6 oz)] 129 lb 6.6 oz (58.7 kg) (11/27 0500) Last BM Date:  (PTA)  Heart: RRR Chest: Ronchi B Abdomen: Distended, unable to elicit tenderness. Quiet.    Extremities: UE edema.  Minor non pitting pedal edema.  Feet cool but not cyanotic or cold. Neuro/Psych:  Unresponsive  Intake/Output from previous day: 11/26 0701 - 11/27 0700 In: 4544.6 [I.V.:3840.6; NG/GT:90; IV Piggyback:614] Out: 2205 [Urine:9; Emesis/NG output:500]  Intake/Output this shift: Total I/O In: -  Out: 160 [Other:160]  Lab Results:  [facility] 05/19/11 0412 05/17/11 1745 05/17/11 0500  WBC 11.4* 8.0 5.1  HGB 12.5 13.0 12.8  HCT 34.7* 37.6 38.0  PLT 15* 52* 78*   BMET  Basename 05/19/11 0412 05/18/11 1745 05/18/11 0500  NA 139 141 140  K 4.0 3.1* 2.9*  CL 103 102 100  CO2 22 19 18*  GLUCOSE 103* 151* 210*  BUN 21 31* 30*  CREATININE 2.20* 3.25* 2.94*  CALCIUM 6.3* 5.7* 6.2*   LFT  Basename 05/19/11 0412 05/16/11 2303  PROT -- 5.0*  ALBUMIN 2.2* --  AST -- 126*  ALT -- 35  ALKPHOS -- 85  BILITOT -- 2.0*  BILIDIR -- --  IBILI -- --   PT/INR  Basename 05/19/11 0412 05/17/11 1745  LABPROT 17.6* 19.4*  INR 1.42 1.61*   Hepatitis Panel  Basename 05/18/11 1745  HEPBSAG NEGATIVE  HCVAB --  HEPAIGM --  HEPBIGM --     ASSESMENT: 1. CG emesis, resolved. Severe  esophagitis/gastritis on EGD 11/25. On carafate, protonix drip. Ok to use heparin in dialysis machine. 2. Diffuse colitis, Suspect low flow state/ischemia, r/o infectious cause but C Diff less likely given pt having no BMs. On broad abx coverage: Zosyn, Vanco per tube, IV Flagyl.  3. Resp failure  4. Anuric renal failure, electrolyte derangements.  5. DM2 on insulin.  6. Alcoholism.  7. Fatty liver.  8. Anemia. Resolved s/p 3 units PRBC, continues stable and resolved. 9. Shock/sepsis. On levophed. 10.  Coagulopathy. Improved.  PLAN: 1.  Supportive care. 2. Will d/c Carafate after tomorrow.   LOS: 3 days   [name]  05/19/2011, 8:33 AM Pager: [phone]   Agree. Will sign off. We are available prn.

## 2011-05-19 NOTE — Progress Notes (Signed)
  05/19/2011, 11:32 AM  SUBJECTIVE: Per PCCM, clincally better. Weaning off pressors. Remains on vent openes eyes during exam. OG with bilious output. No BMs therefore C.diff not obtained.  Vital signs in last 24 hours: Temp:  [96.4 F (35.8 C)-100.2 F (37.9 C)] 97 F (36.1 C) (11/27 1115) Pulse Rate:  [35-116] 84  (11/27 1115) Resp:  [20-37] 26  (11/27 1115) BP: (79-148)/(52-99) 101/67 mmHg (11/27 1100) SpO2:  [91 %-100 %] 100 % (11/27 1115) Arterial Line BP: (72-166)/(49-85) 99/58 mmHg (11/27 1115) FiO2 (%):  [39.6 %-50 %] 40 % (11/27 1115) Weight:  [129 lb 6.6 oz (58.7 kg)] 129 lb 6.6 oz (58.7 kg) (11/27 0500) Last BM Date:  (PTA)  Heart: RRR Chest: Ronchi B Abdomen: Distended but soft, unable to elicit tenderness. Hypoactive  Extremities: UE edema.  Minor non pitting pedal edema.  Feet cool but not cyanotic or cold. Neuro/Psych:  Unresponsive, but opens eyes during abd exam, no wincing.  Intake/Output from previous day: 11/26 0701 - 11/27 0700 In: 4544.6 [I.V.:3840.6; NG/GT:90; IV Piggyback:614] Out: 2205 [Urine:9; Emesis/NG output:500]  Intake/Output this shift: Total I/O In: 674.6 [I.V.:322.4; NG/GT:30; IV Piggyback:322.2] Out: 767 [Emesis/NG output:50; Other:717]  Lab Results:  [facility] 05/19/11 0412 05/17/11 1745 05/17/11 0500  WBC 11.4* 8.0 5.1  HGB 12.5 13.0 12.8  HCT 34.7* 37.6 38.0  PLT 15* 52* 78*   BMET  Basename 05/19/11 0412 05/18/11 1745 05/18/11 0500  NA 139 141 140  K 4.0 3.1* 2.9*  CL 103 102 100  CO2 22 19 18*  GLUCOSE 103* 151* 210*  BUN 21 31* 30*  CREATININE 2.20* 3.25* 2.94*  CALCIUM 6.3* 5.7* 6.2*   LFT  Basename 05/19/11 0412 05/16/11 2303  PROT -- 5.0*  ALBUMIN 2.2* --  AST -- 126*  ALT -- 35  ALKPHOS -- 85  BILITOT -- 2.0*  BILIDIR -- --  IBILI -- --   PT/INR  Basename 05/19/11 0412 05/17/11 1745  LABPROT 17.6* 19.4*  INR 1.42 1.61*   Hepatitis Panel  Basename 05/18/11 1745  HEPBSAG NEGATIVE  HCVAB --    HEPAIGM --  HEPBIGM NEGATIVE     ASSESMENT: 1.Diffuse colitis, Suspect low flow state/ischemia, r/o infectious cause but C Diff less likely given pt having no BMs. On broad abx coverage: Zosyn, Vanco per tube, IV Flagyl.  2. Resp failure-vent 3. Anuric renal failure, electrolyte derangements.  4. DM2 on insulin.  5. Alcoholism hx 6. Anemia. Resolved s/p 3 units PRBC, continues stable and resolved. 7. Shock/sepsis. On levophed, weaning 8.  Coagulopathy. Improved.  PLAN: 1.  Supportive care. No surgical abdomen at present time, will cont to follow.   LOS: 3 days   BRUNING,KEVIN F  05/19/2011, 11:32 AM  Doing better (relatively speaking).  Discussed with Dr. Tyson Alias.  Agree with trickle tube feeds.  No immediate surgical problems.  Will sx off.  D Modean Mccullum   05/19/2011

## 2011-05-19 NOTE — Progress Notes (Signed)
CSW completed assessment, located in shadow chart. Spoke with pt daughter at bedside, explained role and offered support. CSW will continue to follow to provide support and assistance with substance abuse issues or discharge planning, as appropriate.

## 2011-05-19 NOTE — Progress Notes (Signed)
INITIAL ADULT NUTRITION ASSESSMENT Date: 05/19/2011   Time: 12:36 PM Reason for Assessment: TF Consult  ASSESSMENT: Female 77 y.o.  Dx: GI bleed, Sepsis  Hx:  Past Medical History  Diagnosis Date  . Abdominal pain   . Hypothyroid   . Diabetes mellitus   . COPD (chronic obstructive pulmonary disease)   . GERD (gastroesophageal reflux disease)     Related Meds:     . albuterol  6 puff Inhalation Q6H  . antiseptic oral rinse  15 mL Mouth Rinse QID  . chlorhexidine  15 mL Mouth Rinse BID  . feeding supplement (NEPRO CARB STEADY)  1,000 mL Per Tube QPC lunch  . folic acid  1 mg Oral Daily  . heparin  1,000 mL CRRT Once  . hydrocortisone sod succinate (SOLU-CORTEF) injection  50 mg Intravenous Q6H  . insulin aspart  0-7 Units Subcutaneous Q4H  . insulin glargine  10 Units Subcutaneous QHS  . levothyroxine  75 mcg Per Tube QAC breakfast  . LORazepam  0.5 mg Intravenous Q12H  . multivitamin (MVI) IV infusion (LR or D5LR)  10 mL Intravenous Q24H  . pantoprazole sodium  40 mg Per Tube Q1200  . phytonadione (VITAMIN K) IV  10 mg Intravenous Once  . piperacillin-tazobactam (ZOSYN)  IV  2.25 g Intravenous Q8H  . sodium chloride      . sodium chloride      . sodium phosphate  Dextrose 5% IVPB  20 mmol Intravenous Once  . sucralfate  1 g Per Tube Q6H  . thiamine  100 mg Oral Daily  . DISCONTD: folic acid (FOLVITE) IVPB  1 mg Intravenous Daily  . DISCONTD: metronidazole  500 mg Intravenous Q8H  . DISCONTD: thiamine  100 mg Intravenous Daily  . DISCONTD: vancomycin  500 mg Per Tube Q6H     Ht: 4\' 11"  (149.9 cm) Wt: 129 lb 6.6 oz (58.7 kg)  Weight up with fluid retention.  Ideal Wt: 44.5 kg % Ideal Wt: 132%  Usual Wt: 103# % Usual Wt: 125%  BMI=20.8 (using usual weight)  Food/Nutrition Related Hx: Spoke with patient's daughter, who reports weight loss from 105# to 95#, then regained to 103# over the past several months; potassium level usually runs low-takes supplement at  home; also takes B12 supplement  Patient is now receiving CVVHD.  Labs:  BMET    Component Value Date/Time   NA 139 05/19/2011 0412   K 4.0 05/19/2011 0412   CL 103 05/19/2011 0412   CO2 22 05/19/2011 0412   GLUCOSE 103* 05/19/2011 0412   BUN 21 05/19/2011 0412   CREATININE 2.20* 05/19/2011 0412   CALCIUM 6.3* 05/19/2011 0412   GFRNONAA 21* 05/19/2011 0412   GFRAA 24* 05/19/2011 0412  Phosphorous 0.7 (Low)  I/O last 3 completed shifts: In: 7513 [I.V.:5549; NG/GT:180; IV Piggyback:1784] Out: 3065 [Urine:19; Emesis/NG output:1350; Other:1696] Total I/O In: 772.9 [I.V.:377.7; NG/GT:30; IV Piggyback:365.2] Out: 909 [Emesis/NG output:50; Other:859]   Diet Order:  NPO  Tube Feeding Order:  Nepro at 30 ml/h via OG tube will provide 1296 kcals, 58 grams protein, 522 ml free water daily  IVF:    sodium chloride Last Rate: 20 mL/hr at 05/19/11 1200  dextrose   fentaNYL infusion INTRAVENOUS Last Rate: Stopped (05/19/11 0915)  norepinephrine (LEVOPHED) Adult infusion Last Rate: Stopped (05/19/11 1200)  dialysis replacement fluid (prismasate) Last Rate: 500 mL/hr at 05/19/11 0649  dialysis replacement fluid (prismasate) Last Rate: 300 mL/hr at 05/18/11 2000  dialysate (PRISMASATE) Last Rate: 1,000  mL/hr at 05/19/11 1207  DISCONTD: midazolam (VERSED) infusion Last Rate: Stopped (05/19/11 0915)  DISCONTD: pantoprozole (PROTONIX) infusion Last Rate: Stopped (05/19/11 1130)    Estimated Nutritional Needs: Kcal: 1100-1200 Protein: 60-70 grams Fluid: 1200 ml  NUTRITION DIAGNOSIS: -Inadequate oral intake (NI-2.1).  Status: Ongoing  RELATED TO: inability to eat  AS EVIDENCED BY: NPO status  MONITORING/EVALUATION(Goals): TF to meet 90-100% of estimated nutrition needs. Monitor for TF tolerance, weights, labs.  EDUCATION NEEDS: -No education needs identified at this time  INTERVENTION: Since potassium is WNL and phosphorous is low, no need to further restrict these by using  Nepro.  Will change TF to Osmolite 1.5 at 30 ml/h with Prostat 30 ml once daily to provide 1152 kcals, 60 grams protein, 549 ml free water daily.  Dietitian #:  [phone]  DOCUMENTATION CODES Per approved criteria  -Not Applicable    Hettie Holstein 05/19/2011, 12:36 PM

## 2011-05-19 NOTE — Progress Notes (Signed)
eLink Physician-Brief Progress Note Patient Name: [name] DOB: 02/23/1934 MRN: [id number]  Date of Service  05/19/2011   HPI/Events of Note   Resp alkalosis on last ABG with pH greater than 7.5.  Now on CRRT with correction of acid/base but vent rate remains at 35  eICU Interventions  Plan: Decrease vent rate to 24 - follow up ABG in AM   Intervention Category Intermediate Interventions: Other: (Acid/Base) Minor Interventions: Electrolytes abnormality - evaluation and management  [name] 05/19/2011, 1:38 AM

## 2011-05-19 NOTE — Progress Notes (Signed)
UR Completed.   [place] Sweetin Jane [phone] 05/19/2011  

## 2011-05-19 NOTE — Plan of Care (Signed)
Problem: Phase I Progression Outcomes Goal: Pneumonia/flu vaccination screen completed Outcome: Completed/Met Date Met:  05/19/11 Screen completed, vaccines declined

## 2011-05-19 NOTE — Progress Notes (Signed)
Follow up - Critical Care Medicine Note  Patient Details:    [name] is an 77 y.o. female heavy drinker who family had not been in contact with for a day or so who was found down at home covered in coffee ground emesis. She aroused to family but did not stay alert so EMS was called. Apparently her SBP in the field was in the 60s and her glucose was in the 20s. In the ER she was altered, hypothermic, HYPOtensive, found to have AKI, [Na] ~160, and lactate ~19.   Lines, Airways, Drains: 11/25 L IJ >>>  11/25 Foley >>>  11/25 Radial arterial line >>>  11/25 NGT >>> 11/26 RIJ Dialysis catheter>>>  Anti-infectives:  11/25 Vancomycin >>> 11/25 Zosyn >>> 11/26 Flagyl>>>Until cdiff back  Other Medications: Levophed Insulin Sulcrafate Fentanyl Versed Protonix drip Bicarb drip  Microbiology:  Blood culture 05/17/11 >>> no growth to date, awaiting final report  Results for orders placed during the hospital encounter of 05/16/11  MRSA PCR SCREENING     Status: Normal   Collection Time   05/17/11  1:45 AM      Component Value Range Status Comment   MRSA by PCR NEGATIVE  NEGATIVE  Final   CULTURE, BLOOD (ROUTINE X 2)     Status: Normal (Preliminary result)   Collection Time   05/17/11  7:36 AM      Component Value Range Status Comment   Specimen Description BLOOD LEFT HAND   Final    Special Requests BOTTLES DRAWN AEROBIC ONLY 8CC   Final    Setup Time [id number]   Final    Culture     Final    Value:        BLOOD CULTURE RECEIVED NO GROWTH TO DATE CULTURE WILL BE HELD FOR 5 DAYS BEFORE ISSUING A FINAL NEGATIVE REPORT   Report Status PENDING   Incomplete   CULTURE, BLOOD (ROUTINE X 2)     Status: Normal (Preliminary result)   Collection Time   05/17/11  7:40 AM      Component Value Range Status Comment   Specimen Description BLOOD RIGHT WRIST ALINE   Final    Special Requests BOTTLES DRAWN AEROBIC AND ANAEROBIC 10CC   Final    Setup Time [id number]   Final    Culture     Final    Value:        BLOOD CULTURE RECEIVED NO GROWTH TO DATE CULTURE WILL BE HELD FOR 5 DAYS BEFORE ISSUING A FINAL NEGATIVE REPORT   Report Status PENDING   Incomplete     Best Practice/Protocols:   Events: 05/17/11 intubation >>> 05/18/11 Right IJ HD catheter placement, ARF, CVVHD, pressors reduced, PH improved  Studies: Ct Abdomen Pelvis Wo Contrast  05/17/2011  *RADIOLOGY REPORT*  Clinical Data: Sepsis.  Intra-abdominal abscess.  CT ABDOMEN AND PELVIS WITHOUT CONTRAST  Technique:  Multidetector CT imaging of the abdomen and pelvis was performed following the standard protocol without intravenous contrast.  Comparison: None.  Findings: Small bilateral pleural effusions are present. Consolidation is present in the right infrahilar region.  Dependent atelectasis is present.  Coronary artery atherosclerosis is present. If office based assessment of coronary risk factors has not been performed, it is now recommended.  Nasogastric tube is present which terminates at the antrum of the stomach.  Geographic fatty infiltration of the liver is present. Small amount of ascites surrounds the liver.  Old granulomatous disease of the spleen.  Low density lesion is  present in the left adrenal gland, probably representing a small adenoma.  Aortic and visceral atherosclerosis.  Small infrarenal abdominal aortic aneurysm is fusiform and measures 22 mm.  The iliofemoral atherosclerosis is also present. Fatty atrophy of the pancreas. Biliary sludge is present.  No calcified gallstones are identified.  Foley catheter in the urinary bladder.  No small bowel obstruction. Diffuse marked colonic thickening, extending from rectum to cecum. In hospitalized patient, this most likely represent C difficile colitis however other forms of colitis can produce this appearance. The distribution would be unusual for ischemic colitis. Inflammatory bowel disease is always a consideration and colitis. Bilateral hip  osteoarthritis is present.  Pubic symphysis degenerative disease.  Severe sclerosis on both sides of the SI joint with vacuum joint on the right.  This could be associated with seronegative spondyloarthropathies or ordinary degenerative change.  Multilevel lumbar facet arthrosis.    Small calcification is present in the right renal hilum, probably vascular rather than renal calculus.  IMPRESSION: 1.  Small bilateral pleural effusions with dependent atelectasis. 2.  Right infrahilar airspace consolidation suspicious for pneumonia or aspiration. 3.  Atherosclerosis and coronary artery disease. 4.  Fatty liver. 5.  Old granulomatous disease of the spleen. 6.  Diffuse pan colitis.  Question C difficile colitis versus inflammatory bowel disease. 7.  Destructive changes of the SI joints.  Question seronegative spondyloarthropathy.  This could also represent severe degenerative changes. 8.  3 cm infrarenal abdominal aortic aneurysm.  Original Report Authenticated By: Andreas Newport, M.D.   Dg Chest Port 1 View  05/17/2011  *RADIOLOGY REPORT*  Clinical Data: Endotracheal tube placement.  PORTABLE CHEST - 1 VIEW  Comparison: Chest radiograph performed earlier today at 12:38 a.m.  Findings: The patient's endotracheal tube is seen ending 3 cm above the carina.  An enteric tube is noted extending into the stomach. A left IJ line is noted ending about the proximal SVC.  The lungs are relatively well expanded.  Vascular congestion is noted, with bilateral central airspace opacities, likely reflecting mild interstitial edema, similar in appearance to the prior study. No pleural effusion or pneumothorax is seen.  The cardiomediastinal silhouette is borderline enlarged.  No acute osseous abnormalities are identified.  IMPRESSION:  1.  Endotracheal tube seen ending 3 cm above the carina. 2.  Vascular congestion and borderline cardiomegaly, with bilateral central airspace opacities, likely reflecting relatively stable mild  interstitial edema.  Original Report Authenticated By: Tonia Ghent, M.D.   Dg Chest Portable 1 View  05/17/2011  *RADIOLOGY REPORT*  Clinical Data: Left jugular line placement  PORTABLE CHEST - 1 VIEW  Comparison: Portable exam 0038 hours compared to 05/16/2011  Findings: Left jugular line, tip projecting over left SVC near/ at the azygos vein confluence. Mild enlargement of cardiac silhouette with pulmonary vascular congestion. Bilateral interstitial infiltrates, new, question pulmonary edema. No pneumothorax.  IMPRESSION: Tip of left jugular line projects over the SVC at or very near the azygos confluence. Enlargement of cardiac silhouette with pulmonary vascular congestion and question new pulmonary edema.  Original Report Authenticated By: Lollie Marrow, M.D.   Dg Chest Portable 1 View  05/16/2011  *RADIOLOGY REPORT*  Clinical Data: Altered level of consciousness; hypotension.  PORTABLE CHEST - 1 VIEW  Comparison: Chest radiograph performed 01/30/2011  Findings: The lungs are well-aerated and clear.  There is no evidence of focal opacification, pleural effusion or pneumothorax.  The cardiomediastinal silhouette is mildly enlarged.  No acute osseous abnormalities are seen.  IMPRESSION: No acute cardiopulmonary  process seen; mild cardiomegaly again noted.  Original Report Authenticated By: Tonia Ghent, M.D.    Consults: Treatment Team:  Hart Carwin, MD Irena Cords, MD   Subjective:    Overnight Issues: On pressure, renal failure on dialysis Critical value:  Platelets 15, Calcium 6.3, and Phosphorus 0.7 Respiratory alkalosis on last ABG, pH > 7.5 at RR 35 ---> RR was decreased to 24 and ABG is good this AM   Objective:  Vital signs for last 24 hours: Temp:  [97.7 F (36.5 C)-100.4 F (38 C)] 97.7 F (36.5 C) (11/27 0700) Pulse Rate:  [35-116] 94  (11/27 0700) Resp:  [21-37] 23  (11/27 0700) BP: (74-148)/(50-83) 82/55 mmHg (11/27 0700) SpO2:  [91 %-100 %] 100 % (11/27  0700) Arterial Line BP: (72-146)/(53-78) 94/61 mmHg (11/27 0700) FiO2 (%):  [39.7 %-50 %] 39.9 % (11/27 0700) Weight:  [58.7 kg (129 lb 6.6 oz)] 129 lb 6.6 oz (58.7 kg) (11/27 0500)  Hemodynamic parameters for last 24 hours: CVP:  [8 mmHg-205 mmHg] 8 mmHg  Intake/Output from previous day: 11/26 0701 - 11/27 0700 In: 4544.6 [I.V.:3840.6; NG/GT:90; IV Piggyback:614] Out: 2205 [Urine:9; Emesis/NG output:500]  Intake/Output this shift:    Vent settings for last 24 hours: Vent Mode:  [-] PRVC FiO2 (%):  [39.7 %-50 %] 39.9 % Set Rate:  [24 bmp-35 bmp] 24 bmp Vt Set:  [400 mL] 400 mL PEEP:  [5 cmH20] 5 cmH20 Plateau Pressure:  [22 cmH20-32 cmH20] 24 cmH20  Physical Exam:  General:  Well nourished/developed appearance female on ventilator supports that is not arousable Head:  NCAT Eyes:  Eyes closed Ears:  Intact, no discharge Nose:  No discharge Throat:  Midline trachea, no JVD, no LAD Pulmonary:  Coarse-likely due to ventilator, rochi sounds heard CV:  RRR, no murmurs/rubs/gallops ABD:  (+)BS but very deminish, distended, nontender, no guarding, no rebound EXT:  Generalized edema with SCD on board  Assessment/Plan:  1.  Upper GI bleed No active bleed noted Mucosal slothing from colon? Resolved  Consider start TF , maintain at 10 cc/hr ppi  2.  Diffuse colitis Etiology not clear, differential includes ischemic, autoimmune ( Si joint on CT), infectious, C. Diff if stool ESR = 5, ANA = Pending, Anti-dsDNA = Pending, RF = <10,  C3 = 85 (L), C4 = 15 (L) Antibiotic are on board, see below Still no stool for ruling out C. Diff On PPI TTE negative for clot/vegetation Start TF , renal form Work up pending if stool noted to send  3.  Respiratory failure RR at 24/min and AM ABG is WNL Lactic clearing No role bicarb, see renal pcxr with some increase edema Minute vent too high to weann abg to follow  4.  Acute renal failure R/o ATN, likely No hydro on CT With shock  continued and no output, currently on CVVHD kvo  5.  DM II Dc d5 from bicarb follow  6.  Alcoholism/Fatty Liver Thiamine, folic, mvi At risk ETOH WD Versed drip off, with rish WD , add around clock ativan NO CIWA in ICU in this condition...only physician driven treatment  7.  Shock/sepsis CVP noted, 10 Levophed to MAP goal 65 Cortisol level is 21.4.Marland Kitchenborderline, remains on pressors, in this borderline case, will continue stress roids Trop neg Limit use vaso with concerns ischemic bowel GI to determine needs flex sig, holding, no stool noted  8. Colitis Remains culture neg No stool even No enteroccus, no staph, dc vanc No stool even for  cdiff, dc flagyl, oral vanc Continue zosyn Consider flex sig , with Bx..but plat low  9. Thrombocytopenia likley sepsis, MODS related, repeat cbc Dc any drugs that can cause No hep on board Change ppi to tube   LOS: 3 days   Additional comments:   Parke Poisson 05/19/2011  Crit care 35 min  duaghter updated in full daily  Mcarthur Rossetti. Tyson Alias, MD, FACP Pgr: [phone] [facility]  *Care during the described time interval was provided by me and/or other providers on the critical care team.  I have reviewed this patient's available data, including medical history, events of note, physical examination and test results as part of my evaluation.

## 2011-05-20 ENCOUNTER — Inpatient Hospital Stay (HOSPITAL_COMMUNITY): Payer: Medicare Other

## 2011-05-20 LAB — RENAL FUNCTION PANEL
Albumin: 2.3 g/dL — ABNORMAL LOW (ref 3.5–5.2)
BUN: 11 mg/dL (ref 6–23)
BUN: 10 mg/dL (ref 6–23)
CO2: 24 mEq/L (ref 19–32)
CO2: 25 mEq/L (ref 19–32)
Chloride: 103 mEq/L (ref 96–112)
Creatinine, Ser: 1.21 mg/dL — ABNORMAL HIGH (ref 0.50–1.10)
Glucose, Bld: 190 mg/dL — ABNORMAL HIGH (ref 70–99)
Phosphorus: 2 mg/dL — ABNORMAL LOW (ref 2.3–4.6)
Potassium: 3.9 mEq/L (ref 3.5–5.1)

## 2011-05-20 LAB — POCT I-STAT 3, ART BLOOD GAS (G3+)
O2 Saturation: 97 %
pCO2 arterial: 40.8 mmHg (ref 35.0–45.0)
pH, Arterial: 7.378 (ref 7.350–7.400)
pO2, Arterial: 94 mmHg (ref 80.0–100.0)

## 2011-05-20 LAB — GLUCOSE, CAPILLARY
Glucose-Capillary: 123 mg/dL — ABNORMAL HIGH (ref 70–99)
Glucose-Capillary: 153 mg/dL — ABNORMAL HIGH (ref 70–99)
Glucose-Capillary: 50 mg/dL — ABNORMAL LOW (ref 70–99)
Glucose-Capillary: 84 mg/dL (ref 70–99)

## 2011-05-20 LAB — CBC
Platelets: 14 10*3/uL — CL (ref 150–400)
RBC: 3.78 MIL/uL — ABNORMAL LOW (ref 3.87–5.11)
WBC: 15.1 10*3/uL — ABNORMAL HIGH (ref 4.0–10.5)

## 2011-05-20 MED ORDER — CIPROFLOXACIN IN D5W 400 MG/200ML IV SOLN
400.0000 mg | Freq: Two times a day (BID) | INTRAVENOUS | Status: AC
  Administered 2011-05-20 – 2011-05-22 (×6): 400 mg via INTRAVENOUS
  Filled 2011-05-20 (×7): qty 200

## 2011-05-20 MED ORDER — LORAZEPAM 2 MG/ML IJ SOLN
0.5000 mg | Freq: Every day | INTRAMUSCULAR | Status: DC
  Administered 2011-05-21: 0.5 mg via INTRAVENOUS
  Filled 2011-05-20: qty 1

## 2011-05-20 MED ORDER — ALTEPLASE 2 MG IJ SOLR
2.0000 mg | Freq: Once | INTRAMUSCULAR | Status: DC | PRN
  Filled 2011-05-20: qty 2

## 2011-05-20 MED ORDER — METRONIDAZOLE IN NACL 5-0.79 MG/ML-% IV SOLN
500.0000 mg | Freq: Three times a day (TID) | INTRAVENOUS | Status: AC
  Administered 2011-05-20 – 2011-05-26 (×18): 500 mg via INTRAVENOUS
  Filled 2011-05-20 (×19): qty 100

## 2011-05-20 MED ORDER — INSULIN GLARGINE 100 UNIT/ML ~~LOC~~ SOLN
10.0000 [IU] | Freq: Every day | SUBCUTANEOUS | Status: DC
  Administered 2011-05-20 – 2011-06-01 (×5): 10 [IU] via SUBCUTANEOUS
  Filled 2011-05-20 (×2): qty 3

## 2011-05-20 MED ORDER — HYDROCORTISONE SOD SUCCINATE 100 MG IJ SOLR
50.0000 mg | Freq: Two times a day (BID) | INTRAMUSCULAR | Status: DC
  Administered 2011-05-20 – 2011-05-21 (×2): 50 mg via INTRAVENOUS
  Filled 2011-05-20 (×4): qty 1

## 2011-05-20 MED ORDER — DEXTROSE 50 % IV SOLN
INTRAVENOUS | Status: AC
  Administered 2011-05-20: 50 mL via INTRAVENOUS
  Filled 2011-05-20: qty 50

## 2011-05-20 MED ORDER — FENTANYL CITRATE 0.05 MG/ML IJ SOLN
50.0000 ug | INTRAMUSCULAR | Status: DC | PRN
  Administered 2011-05-20 (×2): 50 ug via INTRAVENOUS
  Administered 2011-05-21 – 2011-05-22 (×6): 100 ug via INTRAVENOUS
  Filled 2011-05-20 (×10): qty 2

## 2011-05-20 MED ORDER — DEXTROSE 50 % IV SOLN
50.0000 mL | Freq: Once | INTRAVENOUS | Status: AC | PRN
  Administered 2011-05-20: 50 mL via INTRAVENOUS

## 2011-05-20 NOTE — Progress Notes (Signed)
CBG: 50  Treatment: D50 IV 50 mL  Symptoms: None  Follow-up CBG: Time:0025 CBG Result:139  Possible Reasons for Event: Inadequate meal intake  Comments/MD notified:Zubelevitskiy TF started at 2200 on 11-27   [place], Dimas Aguas

## 2011-05-20 NOTE — Progress Notes (Signed)
Follow up - Critical Care Medicine Note  Patient Details:    [name] is an 77 y.o. female heavy drinker who family had not been in contact with for a day or so who was found down at home covered in coffee ground emesis. She aroused to family but did not stay alert so EMS was called. Apparently her SBP in the field was in the 60s and her glucose was in the 20s. In the ER she was altered, hypothermic, HYPOtensive, found to have AKI, [Na] ~160, and lactate ~19.   Lines, Airways, Drains: 11/25 L IJ >>>  11/25 Foley >>>  11/25 Radial arterial line >>>  11/25 NGT >>> 11/26 RIJ Dialysis catheter>>>  Anti-infectives:  11/25 Vancomycin >>>11/27 11/25 Zosyn >>>11/28 11/26 Flagyl>>>11/27 Flagyl 11/28>>>12/4 planned cipro 11/28>>>12/4 planned  Other Medications: Levophed>>> 11/27 Insulin Sulcrafate Fentanyl Versed int Protonix drip>>11/27 Bicarb drip>>>11/25  Microbiology:  Blood culture 05/17/11 >>> no growth to date, awaiting final report  Best Practice/Protocols:   Events: 05/17/11 intubation >>> 05/18/11 Right IJ HD catheter placement, ARF, CVVHD, pressors reduced, PH improved  Studies: Ct Abdomen Pelvis Wo Contrast IMPRESSION: 1.  Small bilateral pleural effusions with dependent atelectasis. 2.  Right infrahilar airspace consolidation suspicious for pneumonia or aspiration. 3.  Atherosclerosis and coronary artery disease. 4.  Fatty liver. 5.  Old granulomatous disease of the spleen. 6.  Diffuse pan colitis.  Question C difficile colitis versus inflammatory bowel disease. 7.  Destructive changes of the SI joints.  Question seronegative spondyloarthropathy.  This could also represent severe degenerative changes. 8.  3 cm infrarenal abdominal aortic aneurysm.  Original Report Authenticated By: Andreas Newport, M.D.   Consults: Treatment Team:  Irena Cords, MD   Subjective:    Overnight Issues: Off pressors, renal failure on dialysis  Objective:  Vital  signs for last 24 hours: Temp:  [96.8 F (36 C)-98.4 F (36.9 C)] 97.5 F (36.4 C) (11/28 1000) Pulse Rate:  [49-105] 90  (11/28 1000) Resp:  [13-31] 13  (11/28 1000) BP: (101-145)/(49-97) 141/97 mmHg (11/28 1000) SpO2:  [91 %-100 %] 96 % (11/28 1000) Arterial Line BP: (98-152)/(58-90) 130/90 mmHg (11/28 1000) FiO2 (%):  [39.6 %-40.5 %] 40.2 % (11/28 1000) Weight:  [125 lb 10.6 oz (57 kg)] 125 lb 10.6 oz (57 kg) (11/28 0200)  Hemodynamic parameters for last 24 hours: CVP:  [11 mmHg] 11 mmHg  Intake/Output from previous day: 11/27 0701 - 11/28 0700 In: 2334.5 [I.V.:1017.7; NG/GT:495; IV Piggyback:821.8] Out: 3179 [Emesis/NG output:100]  Intake/Output this shift: Total I/O In: 250 [I.V.:60; NG/GT:190] Out: 197 [Other:197]  Vent settings for last 24 hours: Vent Mode:  [-] PRVC FiO2 (%):  [39.6 %-40.5 %] 40.2 % Set Rate:  [24 bmp] 24 bmp Vt Set:  [400 mL] 400 mL PEEP:  [5 cmH20-5.1 cmH20] 5 cmH20 Pressure Support:  [12 cmH20] 12 cmH20 Plateau Pressure:  [20 cmH20-31 cmH20] 25 cmH20  Physical Exam:  General:  Well nourished/developed appearance female on ventilator supports that is not arousable Head:  NCAT Eyes:  Eyes opening and tracking to stimulation, will follow simple commands Ears:  Intact, no discharge Nose:  No discharge Throat:  Midline trachea, no JVD, no LAD Pulmonary:  Coarse-likely due to ventilator, rochi sounds heard CV:  RRR, no murmurs/rubs/gallops ABD:  (+)BS but very deminish, distended, nontender, no guarding, no rebound EXT:  Generalized edema with SCD on board  Assessment/Plan:  1.  Upper GI bleed No active bleed noted Mucosal slothing from colon? Resolved  Consider start TF,  advance as normal to goal ppi  2.  Diffuse colitis Etiology not clear, differential includes ischemic, autoimmune ( Si joint on CT), infectious, C. Diff if stool ESR = 5, ANA = Pending, Anti-dsDNA = <1, RF = <10,  C3 = 85 (L), C4 = 15 (L) Antibiotic are on board, see  below Still no stool for ruling out C. Diff On PPI TTE negative for clot/vegetation Start TF , renal form Gi fo0llowing abx to 12/4  3.  Respiratory failure RR at 24/min and AM ABG is WNL PEEP at 5 Weaning today PS escaltion needed Likely needs further neg balance pcxr in am  Need improved neurostatus pcxr , especially rt base improved  4.  Acute renal failure R/o ATN, likely No hydro on CT With shock continued and no output, currently on CVVHD kvo Can we go to int hd soon?  5.  DM II -Cont. SSI  6.  Alcoholism/Fatty Liver Thiamine, folic, mvi At risk ETOH WD, this risk is reducing  Versed drip off, with rish WD , add around clock ativan NO CIWA in ICU in this condition...only physician driven treatment Ativan to qday  Not bid PT consult  7.  Shock/sepsis Off pressors Cortisol level is 21.4.Marland Kitchen, off pressors, can likely back down on steroids, taper Trop neg Limit use vaso with concerns ischemic bowel GI to determine needs flex sig, holding, no stool noted  8. Colitis Remains culture neg No enteroccus, no staph, dc vanc No stool even for cdiff, dc flagyl, oral vanc Continue abx Consider flex sig , with Bx..but plat low  9. Thrombocytopenia  Lab 05/20/11 0343 05/19/11 1325 05/19/11 0412 05/17/11 1745 05/17/11 0500  PLT 14* 15* 15* 52* 78*    likley sepsis, MODS related, repeat cbc Dc any drugs that can cause No hep on board PPI per tube Transfuse platelets <10 or active bleeding Cbc in am  contiune to treat infection   LOS: 4 days   Crit ill 30 min  Mcarthur Rossetti. Tyson Alias, MD, FACP Pgr: [phone]  Pulmonary & Critical Care   [name] 05/20/2011

## 2011-05-20 NOTE — Progress Notes (Signed)
Subjective: Opens eyes to voice (last ativan at midnight).  On no pressors  Objective: Vital signs in last 24 hours: Blood pressure 124/86, pulse 87, temperature 98.4 F (36.9 C), temperature source Core (Comment), resp. rate 24, height 4\' 7"  (1.397 m), weight 57 kg (125 lb 10.6 oz), last menstrual period 05/17/1983, SpO2 95.00%.   Intake/Output from previous day: 11/27 0701 - 11/28 0700 In: 2334.5 [I.V.:1017.7; NG/GT:495; IV Piggyback:821.8] Out: 3179 [Emesis/NG output:100] Intake/Output this shift: Weight 57 kg today.   Weight 45 kg on 24 Nov    PHYSICAL EXAM General--Opens eyes to voice Chest--rhonchi Heart--no rub Abd--protuberant, nontender Extr--edema of arms  Lab Results:   Lab 05/20/11 0343 05/19/11 1325 05/19/11 0412  NA 138 139 139  K 4.0 3.7 4.0  CL 103 103 103  CO2 24 26 22   BUN 11 14 21   CREATININE 1.21* 1.56* 2.20*  EGFR -- -- --  GLUCOSE 131* -- --  CALCIUM 7.0* 6.5* 6.3*  PHOS 2.8 1.4* 0.7*      Basename 05/20/11 0343 05/19/11 1325  WBC 15.1* 13.3*  HGB 12.0 12.3  HCT 35.0* 34.8*  PLT 14* 15*    Scheduled:   . albuterol  6 puff Inhalation Q6H  . antiseptic oral rinse  15 mL Mouth Rinse QID  . chlorhexidine  15 mL Mouth Rinse BID  . feeding supplement (OSMOLITE 1.5 CAL)  1,000 mL Per Tube Q24H  . feeding supplement  30 mL Per Tube Q1500  . folic acid  1 mg Oral Daily  . heparin  1,000 mL CRRT Once  . hydrocortisone sod succinate (SOLU-CORTEF) injection  50 mg Intravenous Q6H  . insulin aspart  0-7 Units Subcutaneous Q4H  . levothyroxine  75 mcg Per Tube QAC breakfast  . LORazepam  0.5 mg Intravenous Q12H  . multivitamin  5 mL Per Tube Daily  . pantoprazole sodium  40 mg Per Tube Q1200  . piperacillin-tazobactam  3.375 g Intravenous Q6H  . sodium phosphate  Dextrose 5% IVPB  20 mmol Intravenous Once  . sodium phosphate  Dextrose 5% IVPB  20 mmol Intravenous Once  . sucralfate  1 g Per Tube Q6H  . thiamine  100 mg Oral Daily  .  DISCONTD: feeding supplement (NEPRO CARB STEADY)  1,000 mL Per Tube QPC lunch  . DISCONTD: folic acid (FOLVITE) IVPB  1 mg Intravenous Daily  . DISCONTD: insulin glargine  10 Units Subcutaneous QHS  . DISCONTD: metronidazole  500 mg Intravenous Q8H  . DISCONTD: multivitamin (MVI) IV infusion (LR or D5LR)  10 mL Intravenous Q24H  . DISCONTD: piperacillin-tazobactam (ZOSYN)  IV  2.25 g Intravenous Q8H  . DISCONTD: thiamine  100 mg Intravenous Daily  . DISCONTD: vancomycin  500 mg Per Tube Q6H   Continuous:   . sodium chloride 20 mL/hr at 05/19/11 1200  . dextrose    . fentaNYL infusion INTRAVENOUS Stopped (05/19/11 0915)  . norepinephrine (LEVOPHED) Adult infusion Stopped (05/19/11 1200)  . dialysis replacement fluid (prismasate) 500 mL/hr at 05/20/11 0339  . dialysis replacement fluid (prismasate) 300 mL/hr at 05/20/11 1191  . dialysate (PRISMASATE) 1,000 mL/hr at 05/20/11 0340  . DISCONTD: feeding supplement (OSMOLITE 1.5 CAL)    . DISCONTD: midazolam (VERSED) infusion Stopped (05/19/11 0915)  . DISCONTD: pantoprozole (PROTONIX) infusion Stopped (05/19/11 1130)    Assessment/Plan:  1.  oliguric ARF-BP better off pressors-CVVH currently at -25 net UF per hr. Will begin UF at (-)120 cc/hr (3L/d) 2.   VDRF- per PCCM 3.  Metabolic acidosis- off bicarb drip. Improving with CVVH;  pH 7.38, pO2 94, pCO2 41 4.   Colitis- on flagyl, zosyn, po vanco 5.   SIRS- on pressors and broad spectrum abx 6.   ABLA- s/p transfusion 7.   Etoh/fatty liver  8.  UGI bleed- as above  9.  Disposition- pt remains critically ill and will continue with aggressive therapy for now 10. Hypophosphatemia--better with phos replacement  Check CXR    LOS: 4 days   Chealsey Miyamoto F 05/20/2011,7:47 AM

## 2011-05-21 ENCOUNTER — Inpatient Hospital Stay (HOSPITAL_COMMUNITY): Payer: Medicare Other

## 2011-05-21 DIAGNOSIS — J9600 Acute respiratory failure, unspecified whether with hypoxia or hypercapnia: Secondary | ICD-10-CM

## 2011-05-21 DIAGNOSIS — K529 Noninfective gastroenteritis and colitis, unspecified: Secondary | ICD-10-CM

## 2011-05-21 DIAGNOSIS — N179 Acute kidney failure, unspecified: Secondary | ICD-10-CM

## 2011-05-21 LAB — LACTATE DEHYDROGENASE: LDH: 622 U/L — ABNORMAL HIGH (ref 94–250)

## 2011-05-21 LAB — GLUCOSE, CAPILLARY
Glucose-Capillary: 93 mg/dL (ref 70–99)
Glucose-Capillary: 176 mg/dL — ABNORMAL HIGH (ref 70–99)

## 2011-05-21 LAB — CBC
Hemoglobin: 11.6 g/dL — ABNORMAL LOW (ref 12.0–15.0)
MCH: 31.9 pg (ref 26.0–34.0)
MCH: 32 pg (ref 26.0–34.0)
MCV: 92.3 fL (ref 78.0–100.0)
Platelets: 9 10*3/uL — CL (ref 150–400)
Platelets: 40 10*3/uL — ABNORMAL LOW (ref 150–400)
RBC: 3.64 MIL/uL — ABNORMAL LOW (ref 3.87–5.11)
RBC: 3.47 MIL/uL — ABNORMAL LOW (ref 3.87–5.11)
RDW: 18 % — ABNORMAL HIGH (ref 11.5–15.5)
WBC: 19.6 10*3/uL — ABNORMAL HIGH (ref 4.0–10.5)
WBC: 23.6 10*3/uL — ABNORMAL HIGH (ref 4.0–10.5)
WBC: 20.5 10*3/uL — ABNORMAL HIGH (ref 4.0–10.5)

## 2011-05-21 LAB — RENAL FUNCTION PANEL
CO2: 25 mEq/L (ref 19–32)
CO2: 25 mEq/L (ref 19–32)
Calcium: 7.1 mg/dL — ABNORMAL LOW (ref 8.4–10.5)
Chloride: 102 mEq/L (ref 96–112)
Chloride: 104 mEq/L (ref 96–112)
Creatinine, Ser: 1.02 mg/dL (ref 0.50–1.10)
GFR calc Af Amer: 61 mL/min — ABNORMAL LOW (ref >90)
GFR calc Af Amer: 61 mL/min — ABNORMAL LOW (ref >90)
GFR calc non Af Amer: 52 mL/min — ABNORMAL LOW (ref >90)
Glucose, Bld: 167 mg/dL — ABNORMAL HIGH (ref 70–99)
Potassium: 3.8 mEq/L (ref 3.5–5.1)
Sodium: 138 mEq/L (ref 135–145)

## 2011-05-21 LAB — GIARDIA/CRYPTOSPORIDIUM SCREEN(EIA)
Cryptosporidium Screen (EIA): NEGATIVE
Giardia Screen - EIA: NEGATIVE

## 2011-05-21 LAB — POCT I-STAT 3, ART BLOOD GAS (G3+)
Acid-base deficit: 1 mmol/L (ref 0.0–2.0)
Bicarbonate: 25 mEq/L — ABNORMAL HIGH (ref 20.0–24.0)
O2 Saturation: 99 %
O2 Saturation: 99 %
TCO2: 26 mmol/L (ref 0–100)
pCO2 arterial: 40.1 mmHg (ref 35.0–45.0)
pO2, Arterial: 132 mmHg — ABNORMAL HIGH (ref 80.0–100.0)
pO2, Arterial: 141 mmHg — ABNORMAL HIGH (ref 80.0–100.0)

## 2011-05-21 LAB — FECAL LACTOFERRIN, QUANT: Fecal Lactoferrin: POSITIVE

## 2011-05-21 LAB — BILIRUBIN, FRACTIONATED(TOT/DIR/INDIR)
Bilirubin, Direct: 4.4 mg/dL — ABNORMAL HIGH (ref 0.0–0.3)
Total Bilirubin: 5.2 mg/dL — ABNORMAL HIGH (ref 0.3–1.2)

## 2011-05-21 MED ORDER — NONFORMULARY OR COMPOUNDED ITEM
1.0000 | Freq: Four times a day (QID) | Status: DC
  Filled 2011-05-21 (×3): qty 1

## 2011-05-21 MED ORDER — HALOPERIDOL LACTATE 5 MG/ML IJ SOLN
4.0000 mg | INTRAMUSCULAR | Status: DC | PRN
  Administered 2011-05-21 – 2011-05-23 (×4): 4 mg via INTRAVENOUS
  Filled 2011-05-21 (×5): qty 1

## 2011-05-21 MED ORDER — VANCOMYCIN HCL 500 MG IV SOLR
500.0000 mg | Freq: Four times a day (QID) | Status: DC
  Administered 2011-05-21 – 2011-05-25 (×17): 500 mg via RECTAL
  Filled 2011-05-21 (×24): qty 500

## 2011-05-21 MED ORDER — VANCOMYCIN HCL 500 MG IV SOLR
500.0000 mg | Freq: Four times a day (QID) | Status: DC
  Filled 2011-05-21 (×2): qty 500

## 2011-05-21 MED ORDER — VANCOMYCIN 50 MG/ML ORAL SOLUTION
500.0000 mg | Freq: Four times a day (QID) | ORAL | Status: DC
  Administered 2011-05-21 – 2011-06-04 (×40): 500 mg via ORAL
  Filled 2011-05-21 (×63): qty 10

## 2011-05-21 MED ORDER — SODIUM CHLORIDE 0.9 % IV SOLN
0.2000 ug/kg/h | INTRAVENOUS | Status: DC
  Administered 2011-05-21: 0.2 ug/kg/h via INTRAVENOUS
  Filled 2011-05-21 (×2): qty 2

## 2011-05-21 MED ORDER — SODIUM PHOSPHATE 3 MMOLE/ML IV SOLN
30.0000 mmol | Freq: Once | INTRAVENOUS | Status: AC
  Administered 2011-05-21: 30 mmol via INTRAVENOUS
  Filled 2011-05-21: qty 10

## 2011-05-21 MED ORDER — FENTANYL CITRATE 0.05 MG/ML IJ SOLN
25.0000 ug | INTRAMUSCULAR | Status: DC | PRN
  Administered 2011-05-22 – 2011-05-25 (×2): 50 ug via INTRAVENOUS
  Filled 2011-05-21: qty 2

## 2011-05-21 NOTE — Progress Notes (Signed)
Physical Therapy Evaluation Patient Details Name: [name] MRN: [id number] DOB: [birth year] Today's Date: 05/21/2011  Problem List: There is no problem list on file for this patient.   Past Medical History:  Past Medical History  Diagnosis Date  . Abdominal pain   . Hypothyroid   . Diabetes mellitus   . COPD (chronic obstructive pulmonary disease)   . GERD (gastroesophageal reflux disease)    Past Surgical History:  Past Surgical History  Procedure Date  . Abdominal hysterectomy   . Esophagogastroduodenoscopy 05/17/2011    Procedure: ESOPHAGOGASTRODUODENOSCOPY (EGD);  Surgeon: Hart Carwin, MD;  Location: [facility] ENDOSCOPY;  Service: Endoscopy;  Laterality: N/A;    PT Assessment/Plan/Recommendation PT Assessment Clinical Impression Statement: Patient is VDRF secondary to SIRS with decreased mobility secondary to deconditioning from bedrest.  Will benefit from PT to address mobility and endurance issues given that pt. on ventilator.   PT Recommendation/Assessment: Patient will need skilled PT in the acute care venue PT Problem List: Decreased strength;Decreased range of motion;Decreased activity tolerance;Decreased balance;Decreased mobility;Decreased cognition;Decreased knowledge of use of DME;Decreased safety awareness;Decreased knowledge of precautions PT Therapy Diagnosis : Generalized weakness PT Plan PT Frequency: Min 3X/week PT Treatment/Interventions: DME instruction;Functional mobility training;Therapeutic exercise;Balance training;Neuromuscular re-education;Patient/family education;Cognitive remediation PT Recommendation Recommendations for Other Services: Rehab consult Follow Up Recommendations: Inpatient Rehab Equipment Recommended: Defer to next venue PT Goals  Acute Rehab PT Goals PT Goal Formulation: With patient/family Time For Goal Achievement: 2 weeks Pt will go Supine/Side to Sit: with min assist;with HOB 0 degrees;with rail PT Goal: Supine/Side to  Sit - Progress: Other (comment) Pt will Sit at [facility] of Bed: with mod assist;with bilateral upper extremity support;with cues (comment type and amount);3-5 min PT Goal: Sit at Edge Of Bed - Progress: Other (comment) Pt will Transfer Sit to Stand/Stand to Sit: with +2 total assist;from elevated surface;with upper extremity assist;with cues (comment type and amount) (pt= 50%) PT Transfer Goal: Sit to Stand/Stand to Sit - Progress: Other (comment) Pt will Stand: with +2 total assist;1 - 2 min;with bilateral upper extremity support;with cues (comment type and amount) (pt = 70%) PT Goal: Stand - Progress: Other (comment) Pt will Perform Home Exercise Program: with min assist PT Goal: Perform Home Exercise Program - Progress: Other (comment)  PT Evaluation Precautions/Restrictions  Precautions Precautions: Fall Precaution Comments: Has hand mitts secondary to multiple lines and tubes.  On CVVHD, Ventilator, IV and Tube feed. Required Braces or Orthoses: No Restrictions Weight Bearing Restrictions: No Prior Functioning  Home Living Additional Comments: TBA   Cognition Cognition Arousal/Alertness: Awake/alert Overall Cognitive Status: Impaired (secondary to ventilator) Difficult to assess due to: intubated Attention: Impaired Current Attention Level: Focused Attention - Other Comments: seconds at a time Orientation Level:  (Unable to assess) Following Commands: Follows one step commands with increased time Safety/Judgement: Decreased awareness of safety precautions;Decreased safety judgement for tasks assessed Decreased Safety/Judgement: Impulsive Safety/Judgement - Other Comments: Restless in bed.  Multiple lines and tubes. Problem Solving: Requires assistance for problem solving Cognition - Other Comments: Difficult to accurately assess secondary to ventilator. Sensation/Coordination Sensation Light Touch: Not tested (secondary to pt. on vent and difficult to assess) Stereognosis:  Not tested Hot/Cold: Not tested Proprioception: Not tested Coordination Gross Motor Movements are Fluid and Coordinated: Yes Fine Motor Movements are Fluid and Coordinated: Yes Extremity Assessment RUE Assessment RUE Assessment: Exceptions to [facility] RUE Strength RUE Overall Strength: Deficits LUE Assessment LUE Assessment: Exceptions to [facility] LUE Strength LUE Overall Strength: Deficits RLE Assessment RLE Assessment: Exceptions  to [facility] RLE PROM (degrees) Overall PROM Right Lower Extremity: Within functional limits for tasks assessed Right Ankle Dorsiflexion 0-20: 0  RLE Strength RLE Overall Strength: Within Functional Limits for tasks assessed Right Hip Flexion: 4/5 Right Hip Extension: 4/5 Right Knee Extension: 3/5 Right Ankle Dorsiflexion: 1/5 Right Ankle Plantar Flexion: 1/5 LLE Assessment LLE Assessment: Exceptions to WFL LLE PROM (degrees) Overall PROM Left Lower Extremity: Within functional limits for tasks assessed Left Ankle Dorsiflexion 0-20: 0  LLE Strength LLE Overall Strength: Deficits Left Hip Flexion: 2/5 Left Hip Extension: 2/5 Left Knee Extension: 2+/5 Left Ankle Dorsiflexion: 1/5 Left Ankle Plantar Flexion: 1/5 Mobility (including Balance) Bed Mobility Bed Mobility: No Transfers Transfers: No Ambulation/Gait Ambulation/Gait: No Stairs: No Wheelchair Mobility Wheelchair Mobility: No  Posture/Postural Control Posture/Postural Control: Postural limitations Postural Limitations: Poor trunk stability overall.  Extensor tone noted at times. Balance Balance Assessed: Yes Static Sitting Balance Static Sitting - Balance Support: No upper extremity supported;Feet unsupported Static Sitting - Level of Assistance: 1: +2 Total assist;Patient percentage (comment) (pt = 20%) Static Sitting - Comment/# of Minutes: Bed progressively inclined to 30 degree position (vitals taken and in doc flowsheets), then to 45 degrees and then to full chair position.  Pt. so short  that pt's feet did not touch the floor.  Assisted pt. with leaning patient forward with above assist x 4 times with pt. usually initiating  movement but not able to sustain trunk extension for longer than 5- 8 seconds before trunk extension increased and pt. pushing posteriorly such that PT had to lean pt. back against bed.  Pt. also performed some exercises with LEs in this position.   Exercise  General Exercises - Lower Extremity Ankle Circles/Pumps: AAROM;Both;5 reps;Supine (Daughter instructed in exercises) Long Arc Quad: Strengthening;Both;5 reps;Seated;AAROM Heel Slides: Strengthening;Both;5 reps;Supine;AAROM Hip Flexion/Marching: Strengthening;Both;5 reps;Supine;AAROM End of Session PT - End of Session Activity Tolerance: Patient limited by fatigue Patient left: in bed;with call bell in reach;with family/visitor present Nurse Communication: Need for lift equipment General Behavior During Session: [facility] for tasks performed Cognition: Impaired Cognitive Impairment: See above  INGOLD,Jahlani Lorentz 05/21/2011, 12:32 PM [facility] Acute [facility] [phone] (pager)

## 2011-05-21 NOTE — Progress Notes (Signed)
eLink Physician-Brief Progress Note Patient Name: [name] DOB: [birth year] MRN: [id number]  Date of Service  05/21/2011   HPI/Events of Note   Hypotension & brady to 40s with precedex , low dose  eICU Interventions  Dc precedex Use haldol prn   Intervention Category Intermediate Interventions: Hypotension - evaluation and management  Florabelle Cardin V. 05/21/2011, 9:31 PM

## 2011-05-21 NOTE — Progress Notes (Signed)
CRITICAL VALUE ALERT  Critical value received:  Platelets-8  Date of notification:  05/21/11  Time of notification:  0625  Critical value read back:yes  Nurse who received alert:  Marcellina Millin, RN  MD notified (1st page):  Dr. Darrick Penna   Time of first page:  0625  MD notified (2nd page): N/A  Time of second page: N/A  Responding MD:  Dr. Darrick Penna   Time MD responded:  [id number]

## 2011-05-21 NOTE — Significant Event (Signed)
CRITICAL VALUE ALERT  Critical value received: positive C-Diff  Date of notification:  05/21/11  Time of notification:  0900  Critical value read back:yes  Nurse who received alert:  Orma Flaming, RN   MD notified (1st page):  Tyson Alias       Time of first page:  0900  MD notified (2nd page):  Time of second page:  Responding MD:  Tyson Alias  Time MD responded:  0900

## 2011-05-21 NOTE — Progress Notes (Signed)
Follow up - Critical Care Medicine Note  Patient Details:    [name] is an 77 y.o. female heavy drinker who family had not been in contact with for a day or so who was found down at home covered in coffee ground emesis. She aroused to family but did not stay alert so EMS was called. Apparently her SBP in the field was in the 60s and her glucose was in the 20s. In the ER she was altered, hypothermic, HYPOtensive, found to have AKI, [Na] ~160, and lactate ~19.   Lines, Airways, Drains: 11/25 L IJ >>>  11/25 Foley >>>  11/25 Radial arterial line >>>  11/25 NGT >>> 11/26 RIJ Dialysis catheter>>>  Anti-infectives:  11/25 Vancomycin >>>11/27 11/25 Zosyn >>>11/28 11/26 Flagyl>>>11/27 Flagyl 11/28>>> cipro 11/28>>>11/30  cdiff pos 11/29  Oral vanc 11/29>>> vanc enema>>>   Other Medications: Levophed>>> 11/27 Insulin Sulcrafate Fentanyl Versed int Protonix drip>>11/27 Bicarb drip>>>11/25  Microbiology: cdiff POS 11/28* Blood culture 05/17/11 >>> no growth to date, awaiting final report  Best Practice/Protocols:   Events: 05/17/11 intubation >>> 05/18/11 Right IJ HD catheter placement, ARF, CVVHD, pressors reduced, PH improved  Studies: Ct Abdomen Pelvis Wo Contrast IMPRESSION: 1.  Small bilateral pleural effusions with dependent atelectasis. 2.  Right infrahilar airspace consolidation suspicious for pneumonia or aspiration. 3.  Atherosclerosis and coronary artery disease. 4.  Fatty liver. 5.  Old granulomatous disease of the spleen. 6.  Diffuse pan colitis.  Question C difficile colitis versus inflammatory bowel disease. 7.  Destructive changes of the SI joints.  Question seronegative spondyloarthropathy.  This could also represent severe degenerative changes. 8.  3 cm infrarenal abdominal aortic aneurysm.  Original Report Authenticated By: Andreas Newport, M.D.   Consults: Treatment Team:  Irena Cords, MD   Subjective:    Overnight Issues: Still on  CVVHD, more alert this am.  Agitated overnight.    Objective:  Vital signs for last 24 hours: Temp:  [95.5 F (35.3 C)-98.2 F (36.8 C)] 97.9 F (36.6 C) (11/29 0700) Pulse Rate:  [34-113] 105  (11/29 0700) Resp:  [11-29] 25  (11/29 0700) BP: (99-159)/(51-117) 139/76 mmHg (11/29 0600) SpO2:  [82 %-100 %] 90 % (11/29 0700) Arterial Line BP: (113-150)/(64-104) 149/73 mmHg (11/29 0700) FiO2 (%):  [39.7 %-40.3 %] 40 % (11/29 0700) Weight:  [121 lb 0.5 oz (54.9 kg)] 121 lb 0.5 oz (54.9 kg) (11/29 0400)  Hemodynamic parameters for last 24 hours: CVP:  [9 mmHg] 9 mmHg  Intake/Output from previous day: 11/28 0701 - 11/29 0700 In: 2223 [I.V.:410; NG/GT:1070; IV Piggyback:743] Out: 4941 [Urine:12]  Intake/Output this shift:    Vent settings for last 24 hours: Vent Mode:  [-] PRVC FiO2 (%):  [39.7 %-40.3 %] 40 % Set Rate:  [24 bmp] 24 bmp Vt Set:  [400 mL] 400 mL PEEP:  [5 cmH20-5.5 cmH20] 5.5 cmH20 Pressure Support:  [12 cmH20] 12 cmH20 Plateau Pressure:  [22 cmH20-25 cmH20] 22 cmH20  Physical Exam:  General:  Well nourished/developed appearance female on ventilator supports Head:  NCAT Eyes:  Eyes opening and tracking to stimulation, will follow simple commands Ears:  Intact, no discharge Nose:  No discharge Throat:  Midline trachea, no JVD, no LAD Pulmonary:  Coarse-likely due to ventilator, rochi sounds heard CV:  RRR, no murmurs/rubs/gallops ABD:  (+)BS but very deminish, distended, nontender, no guarding, no rebound EXT:  Generalized edema with SCD on board  Assessment/Plan:  1.  Upper GI bleed No active bleed noted Consider  tx 1 bag plat  2.  Diffuse colitis Etiology not clear, differential includes ischemic, autoimmune ( Si joint on CT), infectious, C. Diff if stool ESR = 5, ANA = Pending, Anti-dsDNA = <1, RF = <10,  C3 = 85 (L), C4 = 15 (L)  NOW days and days into hospital course with some stool and pos cdiff? developed in house? Initial event? Add oral vanc,  vanc enema, continue flagyl contact  3.  Respiratory failure Rate at 24, ph noted, reduce rate sbt today planned PS 5-10, cpap 5 goal Her MS is improved  4.  Acute renal failure R/o ATN, likely No hydro on CT Very small amount of urine this am, currently on CVVHD KVO Per renal will continue CVVHD one more day.  5.  DM II -Cont. SSI  6.  Alcoholism/Fatty Liver Thiamine, folic, mvi At risk ETOH WD, this risk is reducing  NO CIWA in ICU in this condition...only physician driven treatment Ativan to qday  Not bid PT consult Limit tylenal Improved with reduced ativan, may need dex for sedation now  7.  Shock/sepsis Off pressors Cortisol level is 21.4.Marland Kitchen, off pressors, steroids being tapered But now HTN.discontinued   8. Colitis Treat cdiff Limit cipro course in this setting  9. Thrombocytopenia  Lab 05/21/11 0400 05/20/11 0343 05/19/11 1325 05/19/11 0412 05/17/11 1745  PLT 8* 14* 15* 15* 52*    likley sepsis, MODS related, repeat cbc Dc any drugs that can cause No hep on board Tx 1 bag plat, repeat  Will assess for TTP, not likely at all Assess ldh, indir bili, per smear, hapto    LOS: 5 days   Crit ill 35 min   Mcarthur Rossetti. Tyson Alias, MD, FACP Pgr: [phone] [place] [facility]   [name],[name] 05/21/2011

## 2011-05-21 NOTE — Progress Notes (Signed)
eLink Physician-Brief Progress Note Patient Name: [name] DOB: Dec 14, 1933 MRN: [id number]  Date of Service  05/21/2011   HPI/Events of Note   Low Phos  eICU Interventions  Phos replaced   Intervention Category Intermediate Interventions: Electrolyte abnormality - evaluation and management Minor Interventions: Electrolytes abnormality - evaluation and management  DETERDING,ELIZABETH 05/21/2011, 5:47 AM

## 2011-05-21 NOTE — Progress Notes (Addendum)
Subjective: Awake, opens eyes, responds to voice.  Still intubated  Objective: Vital signs in last 24 hours: Blood pressure 139/76, pulse 105, temperature 97.9 F (36.6 C), temperature source Core (Comment), resp. rate 25, height 4\' 7"  (1.397 m), weight 54.9 kg (121 lb 0.5 oz), last menstrual period 05/17/1983, SpO2 90.00%.   Intake/Output from previous day: 11/28 0701 - 11/29 0700 In: 2223 [I.V.:410; NG/GT:1070; IV Piggyback:743] Out: 4941 [Urine:12] Intake/Output this shift: Weight  58.7 kg on 27 Nov, 54.9 kg today  remains oliguric  PHYSICAL EXAM General--Awake, alert, intubated--no pressors; HD cath in R IJ Chest--rhonchi Heart--no rub Abd--nontender Extr--trace edema in thinghs  Lab Results:   Lab 05/21/11 0400 05/20/11 1600 05/20/11 0343  NA 138 136 138  K 3.8 3.9 4.0  CL 102 100 103  CO2 25 25 24   BUN 12 10 11   CREATININE 1.02 1.09 1.21*  EGFR -- -- --  GLUCOSE 179* -- --  CALCIUM 7.5* 7.2* 7.0*  PHOS 0.8* 2.0* 2.8      Basename 05/21/11 0400 05/20/11 0343  WBC 19.6* 15.1*  HGB 11.6* 12.0  HCT 33.6* 35.0*  PLT 8* 14*    Scheduled: Continuous:  Assessment/Plan:  1. oliguric ARF-BP better off pressors-CVVH currently  at (-)120 cc/hr (3L/d)--continue 1 more day at (-) 3 L/d  2. VDRF- per PCCM, check CXR in AM  3. Metabolic acidosis- off bicarb drip. Improving with CVVH; pH 7.40, pO2 132, pCO2 40 on 40% FIO2  4. Colitis- on flagyl, zosyn  5. SIRS- off pressors and broad spectrum abx  6. ABLA- s/p transfusion hgb 12 today 7. Etoh/fatty liver  8. UGI bleed- as above  9. Disposition-looks better off pressors and with UF  10. Hypophosphatemia--needed more phos replacement.  Will d/c carafate as this will lower phosphate and I think she will do ok without it   LOS: 5 days   Ayce Pietrzyk F 05/21/2011,8:00 AM

## 2011-05-21 NOTE — Plan of Care (Signed)
Problem: Phase II Progression Outcomes Goal: Progress activities as ordered Outcome: Progressing PT evaluation completed.  Patient will benefit from Rehab consult.  MD:  Please order if you agree.   Will continue PT.  Thanks! [facility] [phone] (pager)

## 2011-05-21 NOTE — Progress Notes (Signed)
CRITICAL VALUE ALERT  Critical value received:  Phos-0.8   Date of notification: 05/11/11  Time of notification:  0540  Critical value read back:yes  Nurse who received alert:  Marcellina Millin, RN  MD notified (1st page):  Dr Deterding  Time of first page:  0542  MD notified (2nd page): N/A  Time of second page: N/A  Responding MD:  Dr. Darrick Penna    Time MD responded:  [phone]

## 2011-05-22 ENCOUNTER — Inpatient Hospital Stay (HOSPITAL_COMMUNITY): Payer: Medicare Other

## 2011-05-22 LAB — CBC
Hemoglobin: 10.7 g/dL — ABNORMAL LOW (ref 12.0–15.0)
MCHC: 35 g/dL (ref 30.0–36.0)
Platelets: 34 10*3/uL — ABNORMAL LOW (ref 150–400)
RDW: 17.9 % — ABNORMAL HIGH (ref 11.5–15.5)

## 2011-05-22 LAB — RENAL FUNCTION PANEL
CO2: 27 mEq/L (ref 19–32)
Calcium: 7.4 mg/dL — ABNORMAL LOW (ref 8.4–10.5)
Creatinine, Ser: 0.99 mg/dL (ref 0.50–1.10)
GFR calc Af Amer: 63 mL/min — ABNORMAL LOW (ref >90)
Glucose, Bld: 59 mg/dL — ABNORMAL LOW (ref 70–99)

## 2011-05-22 LAB — PREPARE PLATELET PHERESIS: Unit division: 0

## 2011-05-22 LAB — DIFFERENTIAL
Basophils Absolute: 0 10*3/uL (ref 0.0–0.1)
Eosinophils Absolute: 0 10*3/uL (ref 0.0–0.7)
Lymphocytes Relative: 6 % — ABNORMAL LOW (ref 12–46)
Neutrophils Relative %: 82 % — ABNORMAL HIGH (ref 43–77)
WBC Morphology: INCREASED

## 2011-05-22 LAB — COMPREHENSIVE METABOLIC PANEL
ALT: 35 U/L (ref 0–35)
Alkaline Phosphatase: 278 U/L — ABNORMAL HIGH (ref 39–117)
CO2: 26 mEq/L (ref 19–32)
Calcium: 7.4 mg/dL — ABNORMAL LOW (ref 8.4–10.5)
GFR calc Af Amer: 73 mL/min — ABNORMAL LOW (ref >90)
GFR calc non Af Amer: 63 mL/min — ABNORMAL LOW (ref >90)
Glucose, Bld: 148 mg/dL — ABNORMAL HIGH (ref 70–99)
Sodium: 138 mEq/L (ref 135–145)

## 2011-05-22 LAB — GLUCOSE, CAPILLARY: Glucose-Capillary: 65 mg/dL — ABNORMAL LOW (ref 70–99)

## 2011-05-22 LAB — HAPTOGLOBIN: Haptoglobin: 63 mg/dL (ref 30–200)

## 2011-05-22 MED ORDER — HEPARIN (PORCINE) IN NACL 2-0.9 UNIT/ML-% IJ SOLN: 1000.0000 mL | Freq: Once | INTRAMUSCULAR | Status: DC

## 2011-05-22 MED ORDER — HEPARIN BOLUS VIA INFUSION
1000.0000 [IU] | INTRAVENOUS | Status: DC | PRN
  Filled 2011-05-22: qty 1000

## 2011-05-22 MED ORDER — PRISMASOL BGK 4/2.5 32-4-2.5 MEQ/L IV SOLN
INTRAVENOUS | Status: DC
  Administered 2011-05-23 (×2): via INTRAVENOUS_CENTRAL
  Filled 2011-05-22 (×8): qty 5000

## 2011-05-22 MED ORDER — SODIUM CHLORIDE 0.9 % IJ SOLN
2.0000 [IU]/kg/h | INTRAMUSCULAR | Status: DC
  Filled 2011-05-22: qty 2

## 2011-05-22 MED ORDER — PRISMASOL BGK 4/2.5 32-4-2.5 MEQ/L IV SOLN
INTRAVENOUS | Status: DC
  Administered 2011-05-22 – 2011-05-23 (×2): via INTRAVENOUS_CENTRAL
  Filled 2011-05-22 (×5): qty 5000

## 2011-05-22 MED ORDER — DEXTROSE 50 % IV SOLN
INTRAVENOUS | Status: AC
  Filled 2011-05-22: qty 50

## 2011-05-22 MED ORDER — ALTEPLASE 2 MG IJ SOLR
2.0000 mg | Freq: Once | INTRAMUSCULAR | Status: AC | PRN
  Filled 2011-05-22 (×2): qty 2

## 2011-05-22 MED ORDER — SODIUM PHOSPHATE 3 MMOLE/ML IV SOLN
30.0000 mmol | Freq: Once | INTRAVENOUS | Status: AC
  Administered 2011-05-22: 30 mmol via INTRAVENOUS
  Filled 2011-05-22: qty 10

## 2011-05-22 MED ORDER — PRISMASOL BGK 4/2.5 32-4-2.5 MEQ/L IV SOLN
INTRAVENOUS | Status: DC
  Filled 2011-05-22 (×5): qty 5000

## 2011-05-22 MED ORDER — PRISMASOL BGK 4/2.5 32-4-2.5 MEQ/L IV SOLN
INTRAVENOUS | Status: DC
  Filled 2011-05-22 (×2): qty 5000

## 2011-05-22 MED ORDER — LORAZEPAM 2 MG/ML IJ SOLN
0.5000 mg | Freq: Two times a day (BID) | INTRAMUSCULAR | Status: DC
  Administered 2011-05-22: 2 mg via INTRAVENOUS
  Administered 2011-05-22 – 2011-05-27 (×5): 0.5 mg via INTRAVENOUS
  Filled 2011-05-22 (×6): qty 1

## 2011-05-22 MED ORDER — SODIUM CHLORIDE 0.9 % IV SOLN
1000.0000 mL | Freq: Once | INTRAVENOUS | Status: AC
  Administered 2011-05-22: 1000 mL via INTRAVENOUS_CENTRAL

## 2011-05-22 MED ORDER — PRISMASOL BGK 4/2.5 32-4-2.5 MEQ/L IV SOLN
INTRAVENOUS | Status: DC
  Filled 2011-05-22 (×3): qty 5000

## 2011-05-22 MED ORDER — ALTEPLASE 2 MG IJ SOLR
2.0000 mg | Freq: Once | INTRAMUSCULAR | Status: AC | PRN
  Filled 2011-05-22: qty 2

## 2011-05-22 NOTE — Procedures (Signed)
Extubation Procedure Note  Patient Details:   Name: [name] DOB: 11-04-1933 MRN: [id number]   Airway Documentation:  Airway 7.5 mm (Active)  Secured at (cm) 21 cm 05/22/2011  9:06 AM  Measured From Lips 05/22/2011  9:06 AM  Secured Location Right 05/22/2011  9:06 AM  Secured By [employer] 05/22/2011  9:06 AM  Tube Holder Repositioned Yes 05/22/2011  9:06 AM  Cuff Pressure (cm H2O) 20 cm H2O 05/21/2011  9:26 AM  Site Condition Dry 05/21/2011  9:26 AM    Evaluation  O2 sats: stable throughout Complications: No apparent complications Patient did tolerate procedure well. Bilateral Breath Sounds: Clear Suctioning: Airway Yes on ability to speak and has adequate cough. Placed on 2L [place], spo2 100, HR 78  Gerome Apley 05/22/2011, 10:01 AM

## 2011-05-22 NOTE — Progress Notes (Signed)
Follow up - Critical Care Medicine Note  Patient Details:    [name] is an 77 y.o. female heavy drinker who family had not been in contact with for a day or so who was found down at home covered in coffee ground emesis. She aroused to family but did not stay alert so EMS was called. Apparently her SBP in the field was in the 60s and her glucose was in the 20s. In the ER she was altered, hypothermic, HYPOtensive, found to have AKI, [Na] ~160, and lactate ~19.  Lines, Airways, Drains: 11/25 L IJ >>>  11/25 Foley >>>  11/25 Radial arterial line >>>11/30  11/25 NGT >>> 11/26 RIJ Dialysis catheter>>>  Anti-infectives:  11/25 Vancomycin >>>11/27 11/25 Zosyn >>>11/28 11/26 Flagyl>>>11/27 Flagyl 11/28>>> cipro 11/28>>>11/30  cdiff pos 11/29  Oral vanc 11/29>>> vanc enema>>>  Other Medications: Levophed>>> 11/27 Insulin Sulcrafate Fentanyl Versed int Protonix drip>>11/27 Bicarb drip>>>11/25  Microbiology: cdiff POS 11/28* Blood culture 05/17/11 >>> no growth to date, awaiting final report  Best Practice/Protocols:   Events: 05/17/11 intubation >>> 05/18/11 Right IJ HD catheter placement, ARF, CVVHD, pressors reduced, PH improved  Studies: Ct Abdomen Pelvis Wo Contrast IMPRESSION: 1.  Small bilateral pleural effusions with dependent atelectasis. 2.  Right infrahilar airspace consolidation suspicious for pneumonia or aspiration. 3.  Atherosclerosis and coronary artery disease. 4.  Fatty liver. 5.  Old granulomatous disease of the spleen. 6.  Diffuse pan colitis.  Question C difficile colitis versus inflammatory bowel disease. 7.  Destructive changes of the SI joints.  Question seronegative spondyloarthropathy.  This could also represent severe degenerative changes. 8.  3 cm infrarenal abdominal aortic aneurysm.  Original Report Authenticated By: Andreas Newport, M.D.   Consults: Treatment Team:  Irena Cords, MD   Subjective:    Overnight Issues: Still on  CVVHD.  Some agitation.  Did not tolerate precedex (hypotensive and bradycardic).  Haldol added for agitation.    Objective:  Vital signs for last 24 hours: Temp:  [89.1 F (31.7 C)-98.1 F (36.7 C)] 97.3 F (36.3 C) (11/30 0059) Pulse Rate:  [46-128] 73  (11/30 0700) Resp:  [10-24] 20  (11/30 0700) BP: (75-161)/(21-130) 108/60 mmHg (11/30 0700) SpO2:  [85 %-100 %] 100 % (11/30 0700) Arterial Line BP: (70-176)/(42-95) 97/55 mmHg (11/30 0700) FiO2 (%):  [39.7 %-40.5 %] 39.9 % (11/30 0700)  Hemodynamic parameters for last 24 hours:    Intake/Output from previous day: 11/29 0701 - 11/30 0700 In: 3284.9 [I.V.:655.9; Blood:294; NG/GT:820; IV Piggyback:1515] Out: 4277 [Urine:4; Stool:700]  Intake/Output this shift:    Vent settings for last 24 hours: Vent Mode:  [-] PRVC FiO2 (%):  [39.7 %-40.5 %] 39.9 % Set Rate:  [24 bmp] 24 bmp Vt Set:  [400 mL] 400 mL PEEP:  [4.5 cmH20-5 cmH20] 5 cmH20 Pressure Support:  [5 cmH20] 5 cmH20 Plateau Pressure:  [20 cmH20-22 cmH20] 20 cmH20  Physical Exam:  General:  Well nourished/developed appearance female on ventilator supports, trying to pull at ETT Head:  NCAT Eyes:  Eyes opening and tracking to stimulation, will follow simple commands Ears:  Intact, no discharge Nose:  No discharge Throat:  Midline trachea, no JVD, no LAD Pulmonary:  Coarse-likely due to ventilator CV:  RRR, no murmurs/rubs/gallops ABD:  (+)BS but very deminish, distended, nontender, no guarding, no rebound EXT:  Generalized edema with SCD on board  Assessment/Plan:  1.  Upper GI bleed No active bleed noted   2.  Diffuse colitis Etiology not clear, differential  includes ischemic, autoimmune ( Si joint on CT), infectious, C. Diff if stool ESR = 5, ANA = Pending, Anti-dsDNA = <1, RF = <10,  C3 = 85 (L), C4 = 15 (L)  NOW days and days into hospital course with some stool and pos cdiff? developed in house? Initial event? Continue oral vanc, vanc enema, continue  flagyl contact  3.  Respiratory failure Wean peep 5 ps5 today, weaned reasonably well yesterday , await MS improvement Dc aline Ventilation  / abg on wean day prior was good SBT, possibly extubate today if tolerates well Her MS is improved, mildly agitated Precedex tried, unable to tolerate,  Haldol added for agitation  4.  Acute renal failure R/o ATN, likely No hydro on CT Still anuric KVO ?Stop CVVHD today if BP can tolerate  5.  DM II -Cont. SSI  6.  Alcoholism/Fatty Liver Thiamine, folic, mvi At risk ETOH WD, this risk is reducing  NO CIWA in ICU in this condition...only physician driven treatment Ativan qday, but more aggitated, back to bid PT consult Limit tylenol Improved with reduced ativan,precedex tried yesterday, became brady and hypotensive  7.  Shock/sepsis Off pressors Cortisol level is 21.4.Marland Kitchen, off pressors But now HTN.discontinued  If drops BP again then restart steroids  8. Colitis Treat cdiff Limit cipro course in this setting, dc today  9. Thrombocytopenia  Lab 05/22/11 0330 05/21/11 2002 05/21/11 1130 05/21/11 0400 05/20/11 0343  PLT 34* 40* 9* 8* 14*    likley sepsis, MODS related, repeat cbc Dc any drugs that can cause No hep on board Platelets improved s/p txfusion, continue to monitor for signs of bleeding Some hemoconcentration noted with WBC, HCT as well  Assessed to TTP, unlikely, although some hemolysis seems to be present LDH mild elevated, NO indirect bili significant, hapto normal -No schistocytes on smear -Howell jolly bodies present    LOS: 6 days   Crit ill 35 min   Mcarthur Rossetti. Tyson Alias, MD, FACP Pgr: [phone] [place] [facility]   [name],[name] 05/22/2011

## 2011-05-22 NOTE — Progress Notes (Signed)
CRITICAL VALUE ALERT  Critical value received: cbg  227  Date of notification:    Time of notification:   Critical value read back:no  Nurse who received alert:   MD notified (1st page):   Time of first page:    MD notified (2nd page):  Time of second page:  Responding MD:   Time MD responded:

## 2011-05-22 NOTE — Progress Notes (Signed)
Speech Language/Pathology Order received for swallow assessment.  Pt. Extubated at approximately 10:00 per RT.  Will return this afternoon.  Pt. Has history of dysphagia, esophageal spasm no stricture, MBS 11/09/10, EGD 2011, barium swallow.  Pt. Will likely need an objective assessment to fully evaluate current swallow function and safest diet.  Breck Coons [place].Ed [employer] [phone]  05/22/2011

## 2011-05-22 NOTE — Progress Notes (Signed)
CRITICAL VALUE ALERT  Critical value received:  58  Date of notification:  05/22/11  Time of notification: 1848  Critical value read back:no Nurse who received alert:  R. Leota Jacobsen  MD notified (1st page):  Dextrose given, following nurse to reassess  Time of first page:  N/A  MD notified (2nd page):  Time of second page:  Responding MD:    Time MD responded: N/A

## 2011-05-22 NOTE — Progress Notes (Signed)
Subjective: Opens eyes, just given fentanyl}  Objective: Vital signs in last 24 hours: Blood pressure 108/60, pulse 73, temperature 97.3 F (36.3 C), temperature source Oral, resp. rate 20, height 4\' 7"  (1.397 m), weight 54.9 kg (121 lb 0.5 oz), last menstrual period 05/17/1983, SpO2 100.00%.   Intake/Output from previous day: 11/29 0701 - 11/30 0700 In: 3284.9 [I.V.:655.9; Blood:294; NG/GT:820; IV Piggyback:1515] Out: 4277 [Urine:4; Stool:700] Intake/Output this shift:      PHYSICAL EXAM General--BP 90/50 Chest--rhonchi Heart--no rub Abd--nontender Extr--edema of arms, trace edema in thighs  Lab Results:   Lab 05/22/11 0330 05/21/11 1600 05/21/11 0400  NA 138 137 138  K 3.6 3.4* 3.8  CL 104 104 102  CO2 26 25 25   BUN 13 13 12   CREATININE 0.88 1.02 1.02  EGFR -- -- --  GLUCOSE 148* -- --  CALCIUM 7.4* 7.1* 7.5*  PHOS 1.4* 2.0* 0.8*      Basename 05/22/11 0330 05/21/11 2002  WBC 26.5* 20.5*  HGB 10.7* 9.5*  HCT 30.6* 27.7*  PLT 34* 40*    Scheduled: Continuous:   . sodium chloride 20 mL/hr at 05/21/11 0942  . dextrose    . dialysis replacement fluid (prismasate) 500 mL/hr at 05/21/11 1625  . dialysis replacement fluid (prismasate) 300 mL/hr at 05/21/11 2350  . dialysate (PRISMASATE) 1,000 mL/hr at 05/21/11 2200  . DISCONTD: dexmedetomidine (PRECEDEX) IV infusion Stopped (05/21/11 2200)  . DISCONTD: norepinephrine (LEVOPHED) Adult infusion Stopped (05/19/11 1200)    Assessment/Plan: 1. oliguric ARF-BP low off pressors-CVVH currently  at (-)120 cc/hr (3L/d)--continue. System clotted.  Will begin heparin 2. VDRF- per PCCM,  CXR with pulm venous congestion and atelectasis 3. Metabolic acidosis- off bicarb drip. Improving with CVVH. 4. Colitis- on flagyl, cipro, vanco.  C diff + 5. SIRS- off pressors and broad spectrum abx  6. ABLA- s/p transfusion hgb 12 today  7. Etoh/fatty liver  8. UGI bleed- as above  9. Disposition-looks better off pressors and  with UF  10. Hypophosphatemia--needs more phos replacement. Will d/c carafate as this will lower phosphate and I think she will do ok without it    LOS: 6 days   [name] F 05/22/2011,8:32 AM

## 2011-05-22 NOTE — Progress Notes (Addendum)
Speech Language/Pathology  Plan for [facility] tomorrow 12/1 to fully assess swallow abilities given history of dysphagia, esophageal spasm, recent intubation.  Breck Coons [place].Ed [employer] [phone]  05/22/2011

## 2011-05-23 ENCOUNTER — Inpatient Hospital Stay (HOSPITAL_COMMUNITY): Payer: Medicare Other

## 2011-05-23 LAB — CULTURE, BLOOD (ROUTINE X 2)
Culture  Setup Time: 201211251434
Culture: NO GROWTH

## 2011-05-23 LAB — RENAL FUNCTION PANEL
Albumin: 2.3 g/dL — ABNORMAL LOW (ref 3.5–5.2)
BUN: 13 mg/dL (ref 6–23)
Calcium: 7.8 mg/dL — ABNORMAL LOW (ref 8.4–10.5)
Chloride: 105 mEq/L (ref 96–112)
GFR calc Af Amer: 67 mL/min — ABNORMAL LOW (ref >90)
GFR calc non Af Amer: 58 mL/min — ABNORMAL LOW (ref >90)
Glucose, Bld: 86 mg/dL (ref 70–99)
Glucose, Bld: 113 mg/dL — ABNORMAL HIGH (ref 70–99)
Phosphorus: 2.6 mg/dL (ref 2.3–4.6)
Phosphorus: 3.6 mg/dL (ref 2.3–4.6)
Potassium: 3.3 mEq/L — ABNORMAL LOW (ref 3.5–5.1)
Sodium: 138 mEq/L (ref 135–145)

## 2011-05-23 LAB — GLUCOSE, CAPILLARY
Glucose-Capillary: 69 mg/dL — ABNORMAL LOW (ref 70–99)
Glucose-Capillary: 55 mg/dL — ABNORMAL LOW (ref 70–99)
Glucose-Capillary: 62 mg/dL — ABNORMAL LOW (ref 70–99)
Glucose-Capillary: 149 mg/dL — ABNORMAL HIGH (ref 70–99)
Glucose-Capillary: 87 mg/dL (ref 70–99)

## 2011-05-23 LAB — POCT I-STAT 3, ART BLOOD GAS (G3+)
O2 Saturation: 84 %
Patient temperature: 35.6
pCO2 arterial: 31.1 mmHg — ABNORMAL LOW (ref 35.0–45.0)

## 2011-05-23 LAB — AMMONIA: Ammonia: 30 umol/L (ref 11–60)

## 2011-05-23 LAB — CBC
MCH: 32 pg (ref 26.0–34.0)
MCHC: 35 g/dL (ref 30.0–36.0)
MCV: 91.5 fL (ref 78.0–100.0)
Platelets: 35 10*3/uL — ABNORMAL LOW (ref 150–400)
RBC: 3.31 MIL/uL — ABNORMAL LOW (ref 3.87–5.11)

## 2011-05-23 MED ORDER — DEXTROSE 50 % IV SOLN
INTRAVENOUS | Status: AC
  Administered 2011-05-23: 25 mL
  Filled 2011-05-23: qty 50

## 2011-05-23 MED ORDER — SODIUM CHLORIDE 0.9 % IJ SOLN
INTRAMUSCULAR | Status: AC
  Administered 2011-05-23: 30 mL
  Filled 2011-05-23: qty 60

## 2011-05-23 MED ORDER — ALTEPLASE 2 MG IJ SOLR
2.0000 mg | Freq: Once | INTRAMUSCULAR | Status: AC | PRN
  Administered 2011-05-23: 2 mg
  Filled 2011-05-23: qty 2

## 2011-05-23 MED ORDER — LACTULOSE ENEMA
300.0000 mL | Freq: Once | ORAL | Status: AC
  Administered 2011-05-23: 300 mL via RECTAL
  Filled 2011-05-23: qty 300

## 2011-05-23 MED ORDER — SODIUM PHOSPHATE 3 MMOLE/ML IV SOLN
21.0000 mmol | Freq: Once | INTRAVENOUS | Status: AC
  Administered 2011-05-23: 21 mmol via INTRAVENOUS
  Filled 2011-05-23: qty 7

## 2011-05-23 MED ORDER — SODIUM CHLORIDE 0.9 % IJ SOLN
INTRAMUSCULAR | Status: AC
  Administered 2011-05-23: 15:00:00
  Filled 2011-05-23: qty 100

## 2011-05-23 NOTE — Progress Notes (Signed)
Speech Language/Pathology ST received order for MBS this date and unable to complete as patient receiving continuous hemodiafiltration. BSE attempted x1 but unable to complete due to decreased LOA and unable to participate in evaluation. ST to follow on 05/24/11. Moreen Fowler, M.S., CCC-SLP [phone]

## 2011-05-23 NOTE — Progress Notes (Signed)
Subjective: Extubated, asleep, difficult to arouse.  Got ativan last night ~11PM  Objective: Vital signs in last 24 hours: Blood pressure 94/48, pulse 83, temperature 96.4 F (35.8 C), temperature source Core (Comment), resp. rate 16, height 4\' 7"  (1.397 m), weight 51.3 kg (113 lb 1.5 oz), last menstrual period 05/17/1983, SpO2 99.00%.   Intake/Output from previous day: 11/30 0701 - 12/01 0700 In: 3546 [I.V.:1050; NG/GT:130; IV Piggyback:2366] Out: 4705 [Stool:1300] Intake/Output this shift: Total I/O In: 50 [I.V.:50] Out: 93 [Urine:9; Other:84] Wt Readings from Last 3 Encounters:  05/23/11 51.3 kg (113 lb 1.5 oz)  05/23/11 51.3 kg (113 lb 1.5 oz)     PHYSICAL EXAM General--asleep Chest--rhonchi Heart--no rub Abd--mild tenderness Extr--still with edema of thighs  Lab Results:   Lab 05/23/11 0500 05/22/11 1521 05/22/11 0330  NA 138 139 138  K 3.3* 3.2* 3.6  CL 104 105 104  CO2 26 27 26   BUN 12 13 13   CREATININE 0.94 0.99 0.88  EGFR -- -- --  GLUCOSE 86 -- --  CALCIUM 7.8* 7.4* 7.4*  PHOS 2.6 1.3* 1.4*      Basename 05/23/11 0500 05/22/11 0330  WBC 21.0* 26.5*  HGB 10.6* 10.7*  HCT 30.3* 30.6*  PLT 35* 34*    Scheduled: Continuous:  Assessment/Plan: 1. oliguric ARF-BP better-CVVH currently  at (-)120 cc/hr (3L/d)--continue. System clotted.  No heparin (platelets 35K)  2. VDRF- per PCCM, CXR with pulm venous congestion and atelectasis  3. Metabolic acidosis- off bicarb drip. Improving with CVVH.  4. Colitis- on flagyl, cipro, vanco. C diff +  5. SIRS- off pressors and on  broad spectrum abx  6. ABLA- s/p transfusion hgb 10.6 today  7. Etoh/fatty liver  8. UGI bleed- as above--hgb stable  9. Disposition-looks better off pressors and with UF  10. Hypophosphatemia--off carafate.  Rx 20 mM IVNa phos today  She looks better--off pressors, off vent-- but she is still very ill with multiple organ systems involved     LOS: 7 days   [name]  F 05/23/2011,9:03 AM

## 2011-05-23 NOTE — Progress Notes (Signed)
Follow up - Critical Care Medicine Note  Patient Details:    [name] is an 77 y.o. female heavy drinker who family had not been in contact with for a day or so who was found down at home covered in coffee ground emesis. She aroused to family but did not stay alert so EMS was called. Apparently her SBP in the field was in the 60s and her glucose was in the 20s. In the ER she was altered, hypothermic, HYPOtensive, found to have AKI, [Na] ~160, and lactate ~19.  Lines, Airways, Drains: 11/25 L IJ >>>  11/25 Foley >>>  11/25 Radial arterial line >>>11/30  11/25 NGT >>> 11/26 RIJ Dialysis catheter>>>  Anti-infectives:  11/25 Vancomycin >>>11/27 11/25 Zosyn >>>11/28 11/26 Flagyl>>>11/27 Flagyl 11/28>>> cipro 11/28>>>11/30  cdiff pos 11/29  Oral vanc 11/29>>> vanc enema>>>  Other Medications: Levophed>>> 11/27 Insulin Sulcrafate Fentanyl Versed int Protonix drip>>11/27 Bicarb drip>>>11/25  Microbiology: cdiff POS 11/28* Blood culture 05/17/11 >>>NEG 2/2   Best Practice/Protocols:   Events: 05/17/11 intubation >>>extubated11/30 05/18/11 Right IJ HD catheter placement, ARF, CVVHD, pressors reduced, PH improved  Studies: Ct Abdomen Pelvis Wo Contrast IMPRESSION: 1.  Small bilateral pleural effusions with dependent atelectasis. 2.  Right infrahilar airspace consolidation suspicious for pneumonia or aspiration. 3.  Atherosclerosis and coronary artery disease. 4.  Fatty liver. 5.  Old granulomatous disease of the spleen. 6.  Diffuse pan colitis.  Question C difficile colitis versus inflammatory bowel disease. 7.  Destructive changes of the SI joints.  Question seronegative spondyloarthropathy.  This could also represent severe degenerative changes. 8.  3 cm infrarenal abdominal aortic aneurysm.  Original Report Authenticated By: Andreas Newport, M.D.   Consults: Treatment Team:  Irena Cords, MD   Subjective:    Overnight Issues: Still on CVVHD.   Extubated  11/30, adequate sats receieved haldol /overnight overnight, groggy today .    Objective:  Vital signs for last 24 hours: Temp:  [95.4 F (35.2 C)-97.3 F (36.3 C)] 95.4 F (35.2 C) (12/01 1400) Pulse Rate:  [74-89] 80  (12/01 1500) Resp:  [13-21] 20  (12/01 1500) BP: (62-127)/(33-86) 89/50 mmHg (12/01 1400) SpO2:  [95 %-100 %] 100 % (12/01 1500) Weight:  [51.3 kg (113 lb 1.5 oz)] 113 lb 1.5 oz (51.3 kg) (12/01 0100)  Hemodynamic parameters for last 24 hours: CVP:  [4 mmHg] 4 mmHg  Intake/Output from previous day: 11/30 0701 - 12/01 0700 In: 3546 [I.V.:1050; NG/GT:130; IV Piggyback:2366] Out: 4705 [Stool:1300]  Intake/Output this shift: Total I/O In: 856 [I.V.:80; IV Piggyback:776] Out: 951 [Urine:9; Other:942]       Physical Exam:  General:  Well nourished/developed appearance female   Head:  NCAT Eyes:  Eyes opening and tracking to stimulation, will follow simple commands Ears:  Intact, no discharge Nose:  No discharge Throat:  Midline trachea, no JVD, no LAD Pulmonary:  Coarse-  CV:  RRR, no murmurs/rubs/gallops ABD:  (+)BS but very deminish, distended, nontender, no guarding, no rebound EXT:  Generalized edema with SCD on board  CXR 12/1 > Cardiomegaly and mild pulmonary edema persist. Small bilateral effusions have increased in size, right greater than left.  Assessment/Plan:  1.  Upper GI bleed No active bleed noted HBG  Is stable    2.  Diffuse colitis-CDiff Positive    ESR = 5, ANA = Pending, Anti-dsDNA = <1, RF = <10,  C3 = 85 (L), C4 = 15 (L)  NOW days and days into hospital course with some stool  and pos cdiff? developed in house? Initial event? Continue oral vanc, vanc enema, continue flagyl Contact precautions  3.  Respiratory failure extubated 11/30   Limit sedation   4.  Acute renal failure-renal following  R/o ATN, likely No hydro on CT Still anuric  CVVHD per renal   5.  DM II -Cont. SSI  6.  Alcoholism/Fatty Liver Thiamine,  folic, mvi At risk ETOH WD, this risk is reducing  NO CIWA in ICU in this condition...only physician driven treatment Ativan d/c  PT consult Limit tylenol  7.  Shock/sepsis Off pressors Cortisol level is 21.4.Marland Kitchen, off pressors If drops BP again then restart steroids  8. Colitis Treat cdiff   9. Thrombocytopenia  Lab 05/23/11 0500 05/22/11 0330 05/21/11 2002 05/21/11 1130 05/21/11 0400  PLT 35* 34* 40* 9* 8*    likley sepsis, MODS related, repeat cbc Dc any drugs that can cause No hep on board Platelets improved s/p txfusion, continue to monitor for signs of bleeding   Assessed to TTP, unlikely, although some hemolysis seems to be present LDH mild elevated, NO indirect bili significant, hapto normal -No schistocytes on smear -Howell jolly bodies present   10) Delirium: somnolent today, protecting airway -ammonia now -empiric pr lactulose (no ng with low plts)   LOS: 7 days     PARRETT,TAMMY NP , PCCM  05/23/2011  Mekhi Sonn

## 2011-05-23 NOTE — Progress Notes (Signed)
CBG: 62  Treatment: D50 IV 25 mL  Symptoms: None  Follow-up CBG: Time:0830 CBG Result:149  Possible Reasons for Event: Inadequate meal intake  Comments/MD notified:N/A    Scruggs, Alan Ripper

## 2011-05-24 ENCOUNTER — Inpatient Hospital Stay (HOSPITAL_COMMUNITY): Payer: Medicare Other

## 2011-05-24 DIAGNOSIS — N179 Acute kidney failure, unspecified: Secondary | ICD-10-CM

## 2011-05-24 DIAGNOSIS — K529 Noninfective gastroenteritis and colitis, unspecified: Secondary | ICD-10-CM

## 2011-05-24 DIAGNOSIS — J9600 Acute respiratory failure, unspecified whether with hypoxia or hypercapnia: Secondary | ICD-10-CM

## 2011-05-24 LAB — COMPREHENSIVE METABOLIC PANEL
ALT: 42 U/L — ABNORMAL HIGH (ref 0–35)
AST: 102 U/L — ABNORMAL HIGH (ref 0–37)
Albumin: 2.6 g/dL — ABNORMAL LOW (ref 3.5–5.2)
Alkaline Phosphatase: 426 U/L — ABNORMAL HIGH (ref 39–117)
BUN: 13 mg/dL (ref 6–23)
Potassium: 3.3 mEq/L — ABNORMAL LOW (ref 3.5–5.1)
Sodium: 134 mEq/L — ABNORMAL LOW (ref 135–145)
Total Protein: 5.4 g/dL — ABNORMAL LOW (ref 6.0–8.3)

## 2011-05-24 LAB — GLUCOSE, CAPILLARY
Glucose-Capillary: 100 mg/dL — ABNORMAL HIGH (ref 70–99)
Glucose-Capillary: 114 mg/dL — ABNORMAL HIGH (ref 70–99)
Glucose-Capillary: 90 mg/dL (ref 70–99)
Glucose-Capillary: 92 mg/dL (ref 70–99)
Glucose-Capillary: 98 mg/dL (ref 70–99)

## 2011-05-24 LAB — STOOL CULTURE

## 2011-05-24 LAB — CBC
MCHC: 35.2 g/dL (ref 30.0–36.0)
Platelets: 54 10*3/uL — ABNORMAL LOW (ref 150–400)
RDW: 17.2 % — ABNORMAL HIGH (ref 11.5–15.5)
WBC: 22.9 10*3/uL — ABNORMAL HIGH (ref 4.0–10.5)

## 2011-05-24 MED ORDER — FUROSEMIDE 10 MG/ML IJ SOLN
160.0000 mg | Freq: Two times a day (BID) | INTRAVENOUS | Status: AC
  Administered 2011-05-24 (×2): 160 mg via INTRAVENOUS
  Filled 2011-05-24 (×2): qty 16

## 2011-05-24 MED ORDER — HEPARIN SODIUM (PORCINE) 1000 UNIT/ML DIALYSIS
1000.0000 [IU] | INTRAMUSCULAR | Status: DC | PRN
  Filled 2011-05-24: qty 6

## 2011-05-24 MED ORDER — SODIUM CHLORIDE 0.9 % IJ SOLN
INTRAMUSCULAR | Status: AC
  Administered 2011-05-24: 10 mL
  Filled 2011-05-24: qty 30

## 2011-05-24 MED ORDER — POTASSIUM CHLORIDE 10 MEQ/50ML IV SOLN
10.0000 meq | INTRAVENOUS | Status: AC
  Administered 2011-05-24 (×4): 10 meq via INTRAVENOUS
  Filled 2011-05-24 (×4): qty 50

## 2011-05-24 MED ORDER — ALBUTEROL SULFATE (5 MG/ML) 0.5% IN NEBU
INHALATION_SOLUTION | RESPIRATORY_TRACT | Status: AC
  Administered 2011-05-24: 2.5 mg via RESPIRATORY_TRACT
  Filled 2011-05-24: qty 0.5

## 2011-05-24 MED ORDER — HEPARIN SODIUM (PORCINE) 1000 UNIT/ML IJ SOLN
INTRAMUSCULAR | Status: AC
  Filled 2011-05-24: qty 1

## 2011-05-24 MED ORDER — ALBUTEROL SULFATE (5 MG/ML) 0.5% IN NEBU
2.5000 mg | INHALATION_SOLUTION | Freq: Four times a day (QID) | RESPIRATORY_TRACT | Status: DC
  Administered 2011-05-24 – 2011-05-26 (×8): 2.5 mg via RESPIRATORY_TRACT
  Filled 2011-05-24 (×7): qty 0.5

## 2011-05-24 MED ORDER — IPRATROPIUM BROMIDE 0.02 % IN SOLN
0.5000 mg | Freq: Four times a day (QID) | RESPIRATORY_TRACT | Status: DC
  Administered 2011-05-24 – 2011-05-26 (×8): 0.5 mg via RESPIRATORY_TRACT
  Filled 2011-05-24 (×8): qty 2.5

## 2011-05-24 NOTE — Progress Notes (Signed)
CBG: 68  Treatment: D50 IV 25 mL  Symptoms: None  Follow-up CBG: Time:1200 CBG Result:141  Possible Reasons for Event: Inadequate meal intake  Comments/MD notified:NA    Scruggs, Alan Ripper

## 2011-05-24 NOTE — Progress Notes (Signed)
Patient's daughter and Nicholaus Corolla,  will be here on Monday, Dec 3 around 1000 until 1400 and would like to sit down with CCM MD to discuss goals of care.

## 2011-05-24 NOTE — Progress Notes (Addendum)
Subjective: Awake, dyspneic just with conversation, "Doc, let me go home!" Still on CVVH-D @ (-)120cc per hr. OFF pressors  Objective: Vital signs in last 24 hours: Blood pressure 114/65, pulse 86, temperature 96.6 F (35.9 C), temperature source Core (Comment), resp. rate 26, height 4\' 7"  (1.397 m), weight 51.6 kg (113 lb 12.1 oz), last menstrual period 05/17/1983, SpO2 100.00%.   Intake/Output from previous day:  Intake/Output this shift:    DATE     WEIGHT 24 Nov       45  kg 27 Nov       58.7kg 29 Nov       54.9 kg   1 Dec       51.3 kg   2 Dec       51.6 kg  PHYSICAL EXAM General--awake, tachypneic Awake, extubate, disoriented Chest--rhonchi, HD cath R IJ Heart--no rub Abd--nontender Extr--no edema  Lab Results:   Lab 05/24/11 0500 05/23/11 1519 05/23/11 0500  NA 134* 138 138  K 3.3* 3.3* 3.3*  CL 101 105 104  CO2 24 25 26   BUN 13 13 12   CREATININE 1.03 1.08 0.94  EGFR -- -- --  GLUCOSE 135* -- --  CALCIUM 8.5 8.1* 7.8*  PHOS 2.9 3.6 2.6      Basename 05/24/11 0500 05/23/11 0500  WBC 22.9* 21.0*  HGB 10.6* 10.6*  HCT 30.1* 30.3*  PLT 54* 35*    Scheduled: Continuous:  Assessment/Plan: 1. oliguric ARF-BP better-CVVH currently  at (-)120 cc/hr (3L/d)--discontinue today. System clotted. Intermittent HD if needed.  IV lasix 160 IV x 2 doses today.  IV KCL  2. VDRF- per PCCM, CXR with pulm venous congestion and atelectasis  3. Metabolic acidosis- off bicarb drip. Improved with CVVH.  4. Colitis- on flagyl, cipro, vanco. C diff +  5. SIRS- off pressors and on broad spectrum abx  6. ABLA- s/p transfusion hgb 10.6 yesterday  7. Etoh/fatty liver  8. UGI bleed- as above--hgb stable  9. Disposition-looks better off pressors and with UF  10. Hypophosphatemia--off carafate.  11. Low platelets (54K yesterday)  Still oliguric.  We may need HD if urine output does not pick up now that she's off CVVH Check CXR today     LOS: 8 days   [name]  F 05/24/2011,8:20 AM

## 2011-05-24 NOTE — Progress Notes (Signed)
Speech Language/Pathology Clinical/Bedside Swallow Evaluation Patient Details  Name: [name] MRN: [id number] DOB: Dec 08, 1933 Today's Date: 05/24/2011  Past Medical History:  Past Medical History  Diagnosis Date  . Abdominal pain   . Hypothyroid   . Diabetes mellitus   . COPD (chronic obstructive pulmonary disease)   . GERD (gastroesophageal reflux disease)    Past Surgical History:  Past Surgical History  Procedure Date  . Abdominal hysterectomy   . Esophagogastroduodenoscopy 05/17/2011    Procedure: ESOPHAGOGASTRODUODENOSCOPY (EGD);  Surgeon: Hart Carwin, MD;  Location: [facility] ENDOSCOPY;  Service: Endoscopy;  Laterality: N/A;    Assessment/Recommendations/Treatment Plan    SLP Assessment Clinical Impression Statement: Evaluation limited secondary to lethargy.  Patient wilth weak, ineffective cough decreasing ability to protect airway.  Pt. not ready for PO's or MBS at this time. ST to address on 05/25/11 to reassess for PO readiness and to proceed with MBS to rule out aspiration. Risk for Aspiration: Severe Other Related Risk Factors: History of pneumonia;History of dysphagia;History of GERD;History of esophageal-related issues;Lethargy;Cognitive impairment;Decreased management of secretions;Decreased respiratory status  Recommendations General Recommendation: NPO Oral Care Recommendations: Oral care QID  Prognosis Prognosis for Safe Diet Advancement: Fair Barriers to Reach Goals: Cognitive deficits  Individuals Consulted Consulted and Agree with Results and Recommendations: Patient unable/family or caregiver not available;RN  Swallow Study Goals  SLP Swallowing Goals Goal #3: Patient will consume least restrictive diet with least restrictive liquid without observed clinical signs of aspiration with moderate assistance.     General  Date of Onset: 05/17/11 Other Pertinent Information: Pt. with history of dysphagia and esophageal spasm. MBS completed in  11/09/10 and EGD 2011.  Received order for MBS on 05/23/11 but not completed  secondary to patient receiving continuous dialysis. BSE attempt x1 on 05/23/11 but not completed due to decreased LOA  of patient . Type of Study: Bedside swallow evaluation Diet Prior to this Study: Regular;Thin liquids Temperature Spikes Noted: No History of Intubation: Yes Length of Intubations (days): 5 days Date extubated: 05/22/11 Behavior/Cognition: Lethargic Oral Cavity - Dentition: Missing dentition (edentulous,top  with missing dentition on bottom) Vision: Impaired for self-feeding Patient Positioning: Upright in bed Baseline Vocal Quality: Breathy;Low vocal intensity;Hoarse Volitional Cough: Weak;Congested Volitional Swallow: Unable to elicit Ice chips: Tested (comment)  Oral Motor/Sensory Function  Labial ROM: Within Functional Limits Labial Symmetry: Within Functional Limits Labial Strength: Reduced Labial Sensation: Reduced Lingual ROM:  (unable to complete due to decreased level of alertness.) Facial ROM: Within Functional Limits Facial Symmetry: Within Functional Limits Facial Strength: Within Functional Limits Facial Sensation: Reduced Mandible: Within Functional Limits  Consistency Results  Ice Chips Ice chips: Impaired Presentation: Spoon Oral Phase Impairments: Reduced labial seal;Reduced lingual movement/coordination;Poor awareness of bolus Oral Phase Functional Implications: Right anterior spillage Pharyngeal Phase Impairments: Delayed Swallow;Decreased hyoid-laryngeal movement;Cough - Delayed Moreen Fowler M.S., CCC-SLP [phone] [facility] 05/24/2011,12:55 PM

## 2011-05-24 NOTE — Plan of Care (Signed)
Problem: Phase III Progression Outcomes Goal: Nutritional plan in place Outcome: Not Progressing Not progressing due to continued status of NPO.

## 2011-05-24 NOTE — Progress Notes (Signed)
Follow up - Critical Care Medicine Note  Patient Details:    [name] is an 77 y.o. female heavy drinker who family had not been in contact with for a day or so who was found down at home covered in coffee ground emesis. She aroused to family but did not stay alert so EMS was called. Apparently her SBP in the field was in the 60s and her glucose was in the 20s. In the ER she was altered, hypothermic, HYPOtensive, found to have AKI, [Na] ~160, and lactate ~19.  Lines, Airways, Drains: 11/25 L IJ >>>  11/25 Foley >>>  11/25 Radial arterial line >>>11/30  11/25 NGT >>>12/1  11/26 RIJ Dialysis catheter>>>  Anti-infectives:  11/25 Vancomycin >>>11/27 11/25 Zosyn >>>11/28 11/26 Flagyl>>>11/27 Flagyl 11/28>>> cipro 11/28>>>11/30  cdiff pos 11/29  Oral vanc 11/29>>> vanc enema>>>  Other Medications: Levophed>>> 11/27 Insulin Sulcrafate Fentanyl Versed int Protonix drip>>11/27 Bicarb drip>>>11/25  Microbiology: cdiff POS 11/28* Blood culture 05/17/11 >>>NEG 2/2   Best Practice/Protocols:   Events: 05/17/11 intubation >>>extubated11/30 05/18/11 Right IJ HD catheter placement, ARF, CVVHD, pressors reduced, PH improved  Studies: Ct Abdomen Pelvis Wo Contrast IMPRESSION: 1.  Small bilateral pleural effusions with dependent atelectasis. 2.  Right infrahilar airspace consolidation suspicious for pneumonia or aspiration. 3.  Atherosclerosis and coronary artery disease. 4.  Fatty liver. 5.  Old granulomatous disease of the spleen. 6.  Diffuse pan colitis.  Question C difficile colitis versus inflammatory bowel disease. 7.  Destructive changes of the SI joints.  Question seronegative spondyloarthropathy.  This could also represent severe degenerative changes. 8.  3 cm infrarenal abdominal aortic aneurysm.  Original Report Authenticated By: Andreas Newport, M.D.   Consults: Treatment Team:  Irena Cords, MD   Subjective:    Overnight Issues: CVVHD stopped , lasix  given w/ no sign UOP  IMproved mentation , still confused Family at bedside.  Diarrhea persists  Speech unable to do bedside eval due to confusion   Objective:  Vital signs for last 24 hours: Temp:  [95.4 F (35.2 C)-98.1 F (36.7 C)] 98.1 F (36.7 C) (12/02 1200) Pulse Rate:  [39-114] 82  (12/02 1200) Resp:  [14-26] 21  (12/02 1200) BP: (75-146)/(47-110) 86/58 mmHg (12/02 1200) SpO2:  [93 %-100 %] 99 % (12/02 1200) Weight:  [51.6 kg (113 lb 12.1 oz)] 113 lb 12.1 oz (51.6 kg) (12/02 0400)  Hemodynamic parameters for last 24 hours: CVP:  [0 mmHg-6 mmHg] 1 mmHg  Intake/Output from previous day: 12/01 0701 - 12/02 0700 In: 4893 [I.V.:470; IV Piggyback:2673] Out: 5575 [Urine:9; Stool:2250]  Intake/Output this shift: Total I/O In: 870 [I.V.:20; IV Piggyback:850] Out: 193 [Other:193]       Physical Exam:  General:  NAD , confused  Head:  NCAT Eyes:   PERRL Ears:  Intact, no discharge Nose:  No discharge Throat:  Midline trachea, no JVD, no LAD Pulmonary:  Coarse- rhonchi  CV:  RRR, no murmurs/rubs/gallops ABD:  (+)BS but very deminish, distended, nontender, no guarding, no rebound EXT:  Generalized edema with SCD on board  CXR 12/2 > No significant change bilateral lower lobe opacities and effusions.    Assessment/Plan:  1.  Upper GI bleed No active bleed noted HBG  Is stable    Lab 05/24/11 0500 05/23/11 0500 05/22/11 0330  HGB 10.6* 10.6* 10.7*  HCT 30.1* 30.3* 30.6*  WBC 22.9* 21.0* 26.5*  PLT 54* 35* 34*     2.  Diffuse colitis-CDiff Positive    ESR =  5, ANA = Pending, Anti-dsDNA = <1, RF = <10,  C3 = 85 (L), C4 = 15 (L)  NOW days and days into hospital course with some stool and pos cdiff? developed in house? Initial event? Continue oral vanc, vanc enema, continue flagyl Contact precautions  3.  Respiratory failure extubated 11/30   CXR No significant change bilateral lower lobe opacities and effusions. Limit sedating meds Add NEBS   4.   Acute renal failure-renal following  R/o ATN, likely No hydro on CT  Lab 05/24/11 0500 05/23/11 1519 05/23/11 0500 05/22/11 1521 05/22/11 0330  CREATININE 1.03 1.08 0.94 0.99 0.88   Still anuric,  CVVHD stopped 12/2  Lasix per renal w/ no sign UOP Per renal HD if no UOP    5.  DM II  Lab 05/24/11 1106 05/24/11 0753 05/24/11 0409 05/24/11 0019 05/23/11 2015  GLUCAP 100* 114* 92 90 87   -Cont. SSI  6.  Alcoholism/Fatty Liver Thiamine, folic, mvi At risk ETOH WD, this risk is reducing  NO CIWA in ICU in this condition...only physician driven treatment Ativan d/c  PT consult Limit tylenol  7.  Shock/sepsis Off pressors Cortisol level is 21.4.Marland Kitchen, off pressors If drops BP again then restart steroids  8. Colitis Treat cdiff   9. Thrombocytopenia  Lab 05/24/11 0500 05/23/11 0500 05/22/11 0330 05/21/11 2002 05/21/11 1130  PLT 54* 35* 34* 40* 9*   likley sepsis, MODS related Dc any drugs that can cause No hep on board Platelets improved s/p txfusion, continue to monitor for signs of bleeding   Assessed for TTP, unlikely, although some hemolysis seems to be present LDH mild elevated, NO indirect bili significant, hapto normal -No schistocytes on smear -Howell jolly bodies present   10) Delirium:   Improved but still confused ? Baseline  -ammonia was nml at 30    11. Hypokalemia  Replaced  bmet in am    LOS: 8 days   Family at bedside and updated  Son says pt has advanced directive, will speak with is sister who is POA   PARRETT,TAMMY NP , PCCM  05/24/2011  Aritza Brunet, Riley Lam

## 2011-05-25 ENCOUNTER — Inpatient Hospital Stay (HOSPITAL_COMMUNITY): Payer: Medicare Other

## 2011-05-25 DIAGNOSIS — E876 Hypokalemia: Secondary | ICD-10-CM | POA: Diagnosis present

## 2011-05-25 DIAGNOSIS — N179 Acute kidney failure, unspecified: Secondary | ICD-10-CM | POA: Diagnosis present

## 2011-05-25 DIAGNOSIS — J9600 Acute respiratory failure, unspecified whether with hypoxia or hypercapnia: Secondary | ICD-10-CM | POA: Diagnosis present

## 2011-05-25 DIAGNOSIS — K2970 Gastritis, unspecified, without bleeding: Secondary | ICD-10-CM

## 2011-05-25 DIAGNOSIS — A419 Sepsis, unspecified organism: Secondary | ICD-10-CM | POA: Diagnosis present

## 2011-05-25 DIAGNOSIS — A0472 Enterocolitis due to Clostridium difficile, not specified as recurrent: Secondary | ICD-10-CM

## 2011-05-25 DIAGNOSIS — G9340 Encephalopathy, unspecified: Secondary | ICD-10-CM | POA: Diagnosis present

## 2011-05-25 DIAGNOSIS — D649 Anemia, unspecified: Secondary | ICD-10-CM | POA: Diagnosis present

## 2011-05-25 DIAGNOSIS — K700 Alcoholic fatty liver: Secondary | ICD-10-CM | POA: Diagnosis present

## 2011-05-25 DIAGNOSIS — E039 Hypothyroidism, unspecified: Secondary | ICD-10-CM | POA: Diagnosis present

## 2011-05-25 DIAGNOSIS — R6521 Severe sepsis with septic shock: Secondary | ICD-10-CM | POA: Diagnosis present

## 2011-05-25 LAB — RENAL FUNCTION PANEL
Albumin: 2.5 g/dL — ABNORMAL LOW (ref 3.5–5.2)
BUN: 25 mg/dL — ABNORMAL HIGH (ref 6–23)
Chloride: 100 mEq/L (ref 96–112)
GFR calc non Af Amer: 21 mL/min — ABNORMAL LOW (ref >90)
Potassium: 4 mEq/L (ref 3.5–5.1)

## 2011-05-25 LAB — GLUCOSE, CAPILLARY
Glucose-Capillary: 123 mg/dL — ABNORMAL HIGH (ref 70–99)
Glucose-Capillary: 76 mg/dL (ref 70–99)
Glucose-Capillary: 106 mg/dL — ABNORMAL HIGH (ref 70–99)
Glucose-Capillary: 123 mg/dL — ABNORMAL HIGH (ref 70–99)

## 2011-05-25 LAB — CBC
HCT: 26.5 % — ABNORMAL LOW (ref 36.0–46.0)
Hemoglobin: 9.5 g/dL — ABNORMAL LOW (ref 12.0–15.0)
MCH: 32.1 pg (ref 26.0–34.0)
MCV: 89.5 fL (ref 78.0–100.0)
RBC: 2.96 MIL/uL — ABNORMAL LOW (ref 3.87–5.11)

## 2011-05-25 LAB — BASIC METABOLIC PANEL
CO2: 21 mEq/L (ref 19–32)
Chloride: 100 mEq/L (ref 96–112)
Creatinine, Ser: 2.18 mg/dL — ABNORMAL HIGH (ref 0.50–1.10)
Glucose, Bld: 160 mg/dL — ABNORMAL HIGH (ref 70–99)

## 2011-05-25 LAB — MAGNESIUM: Magnesium: 2.4 mg/dL (ref 1.5–2.5)

## 2011-05-25 MED ORDER — SODIUM CHLORIDE 0.9 % IJ SOLN
INTRAMUSCULAR | Status: AC
  Administered 2011-05-25: 10 mL
  Filled 2011-05-25: qty 40

## 2011-05-25 MED ORDER — INSULIN ASPART 100 UNIT/ML ~~LOC~~ SOLN
2.0000 [IU] | Freq: Three times a day (TID) | SUBCUTANEOUS | Status: DC
  Administered 2011-05-29 – 2011-05-30 (×2): 2 [IU] via SUBCUTANEOUS
  Administered 2011-05-31 – 2011-06-03 (×4): 3 [IU] via SUBCUTANEOUS
  Administered 2011-06-03 – 2011-06-04 (×2): 2 [IU] via SUBCUTANEOUS
  Administered 2011-06-04: 3 [IU] via SUBCUTANEOUS
  Filled 2011-05-25: qty 3

## 2011-05-25 MED ORDER — LEVOTHYROXINE SODIUM 75 MCG PO TABS
75.0000 ug | ORAL_TABLET | Freq: Every day | ORAL | Status: DC
  Administered 2011-05-26 – 2011-06-04 (×10): 75 ug via ORAL
  Filled 2011-05-25 (×11): qty 1

## 2011-05-25 NOTE — Progress Notes (Signed)
Patient ID: [name], female   DOB: [birth year], 77 y.o.   MRN: [id number] Physician Discharge Summary  Patient ID: [name] MRN: [id number] DOB/AGE: [birth year] 77 y.o.  Admit date: 05/16/2011 Discharge date: 05/25/2011  Admission Diagnoses: Altered Mental status, GI bleed, shock and renal failure.   Diagnoses at time of transfer:  Active Problems:  C. difficile colitis  Acute renal failure  Gastritis  Encephalopathy acute  Septic shock  GI bleed  Acute respiratory failure  Anemia  Alcohol induced fatty liver  Hypothyroid  Hypokalemia  Patient Details:    [name] is an 77 y.o. female.    Lines, Airways, Drains:  11/25 L IJ >>>  11/25 Foley >>>  11/25 Radial arterial line >>>11/30  11/25 NGT >>>12/1  11/26 RIJ Dialysis catheter>>>   Microbiology:  cdiff POS 11/28*  Blood culture 05/17/11 >>>NEG 2/2   Anti-infectives:  11/25 Vancomycin >>>11/27  11/25 Zosyn >>>11/28  11/26 Flagyl>>>11/27  Flagyl 11/28>>>  cipro 11/28>>>11/30  Oral vanc 11/29>>>  vanc enema>>> 12/3  Events:  05/17/11 intubation >>>extubated11/30  05/18/11 Right IJ HD catheter placement, ARF, CVVHD, pressors reduced, PH improved  HPI SHERI GATCHEL is an 77 y.o. female heavy drinker who family had not been in contact with for a day or so who was found down at home on 11/25 w/ altered MS, hypotension, hypoglycemic (20s) and covered in coffee ground emesis. Admitted to critical care service.  Hospital Course:   1. Upper GI bleed. ? Gastritis. Has h/o ETOH.  [facility] ER as described above. Admitted to ICU. Transfused. Given PPI, monitored and Hgb stabilized.   Lab  05/25/11 0500  05/24/11 0500  05/23/11 0500   HGB  9.5*  10.6*  10.6*   Recommendation: -cont PPI  -trend Hgb   2. Diffuse colitis -C Diff Positive  Discovered days into hospital course with some stool and? developed in house? Initial event? At this point unclear as patient is still  unable to give accurate history.  ESR = 5, ANA = Pending, Anti-dsDNA = <1, RF = <10, C3 = 85 (L), C4 = 15 (L) Recommendation: Continue oral vanc and IV continue flagyl, will stop vanc enemas  Contact precautions  3. Respiratory failure, extubated 11/30  Intubated for acute respiratory failure and profound metabolic acidosis. May have been element of aspiration but cultures all negative. Treated on mechanical vent for duration described above. Empiriv antibiotics given as described above. Extubated on 11/30. Weaning FIO2. Still has some effusion and residual atelectasis.  CXR No significant change bilateral lower lobe opacities and effusions. Plan:  -maximize activity  -keep negative fluid balance  -wean FIO2  -PRN CXR  4. Acute renal failure in setting of ATN, and shock:  Last hemodialysis on 12/2. Scr bumped a little since stopped however she is making urine.   Recent Labs   Basename  05/25/11 0500  05/24/11 0500  05/23/11 1519    CREATININE  2.18*2.20*  1.03  1.08   recommendation -Lasix per renal w/ no sign UOP  -Per renal HD if no UOP   5. DM II   Lab  05/25/11 1205  05/25/11 0824  05/25/11 0411  05/25/11 0014  05/24/11 2029   GLUCAP  100*  76  123*  108*  98   Recommendation:  -Cont. SSI   6. Alcoholism/Fatty Liver, At risk ETOH WD, this risk is reducing  Recommendation: -Thiamine, folic, mvi  -NO CIWA in ICU in this condition...only physician driven  treatment  -Limit tylenol   7. Shock/sepsis (resolved). This was multi-factoral in the setting of acute gi bleed and sepsis. Treated with volume resuscitation efforts and brief vaso-active gtts. Now hemodynamically stable.   8. Thrombocytopenia (improved)  likley sepsis, MODS related . Dc'd  any drugs that can cause, No hep on board.  Lab  05/25/11 0500  05/24/11 0500  05/23/11 0500  05/22/11 0330  05/21/11 2002   PLT  111*  54*  35*  34*  40*   Recommendation:  - continue to monitor for signs of bleeding   9)  Delirium/encephalopathy: Presented with severely altered mental status. Improved but still confused.  Does have ETOH history.  We are not sure what her baseline status is.  Plan:  -supportive care   10) hypokalemia.  Lab  05/25/11 0500  05/24/11 0500  05/23/11 1519   K  3.64.0  3.3*  3.3*   plan:  -replaced/monitor   11) Hypothyroid Recommendation -continue supplementation   Discharge Exam: Temp:  [97.3 F (36.3 C)-99.7 F (37.6 C)] 98.6 F (37 C) (12/03 1300) Pulse Rate:  [80-105] 82  (12/03 1300) Resp:  [12-25] 15  (12/03 1300) BP: (90-143)/(52-84) 124/80 mmHg (12/03 1300) SpO2:  [96 %-100 %] 100 % (12/03 1300) Weight:  [51.8 kg (114 lb 3.2 oz)] 114 lb 3.2 oz (51.8 kg) (12/03 0447) CVP:  [12 mmHg-279 mmHg] 15 mmHg  Intake/Output Summary (Last 24 hours) at 05/25/11 1406 Last data filed at 05/25/11 1328  Gross per 24 hour  Intake   2900 ml  Output    990 ml  Net   1910 ml   General: NAD , confused  Head: NCAT  Eyes: PERRL  Ears: Intact, no discharge  Nose: No discharge  Throat: Midline trachea, no JVD, no LAD  Pulmonary: Coarse- rhonchi  CV: RRR, no murmurs/rubs/gallops  ABD: (+)BS but very deminish, distended, nontender, no guarding, no rebound  EXT: Generalized edema with SCD on board   Labs at time of transfer Lab Results  Component Value Date   CREATININE 2.18* 05/25/2011   CREATININE 2.20* 05/25/2011   BUN 25* 05/25/2011   BUN 25* 05/25/2011   NA 134* 05/25/2011   NA 134* 05/25/2011   K 3.6 05/25/2011   K 4.0 05/25/2011   CL 100 05/25/2011   CL 100 05/25/2011   CO2 21 05/25/2011   CO2 21 05/25/2011   Lab Results  Component Value Date   WBC 20.3* 05/25/2011   HGB 9.5* 05/25/2011   HCT 26.5* 05/25/2011   MCV 89.5 05/25/2011   PLT 111* 05/25/2011   Lab Results  Component Value Date   ALT 42* 05/24/2011   AST 102* 05/24/2011   ALKPHOS 426* 05/24/2011   BILITOT 3.9* 05/24/2011   Lab Results  Component Value Date   INR 1.42 05/19/2011   INR 1.61* 05/17/2011     INR 2.01* 05/16/2011    Current radiology studies at time of transfer Dg Chest [facility] 1 View  05/25/2011  *RADIOLOGY REPORT*  Clinical Data: Shortness of breath.  PORTABLE CHEST - 1 VIEW  Comparison: 05/24/2011  Findings: Right and left jugular vein catheter tips are in the superior vena cava.  The patient has moderate bilateral pleural effusions, slightly diminished.  Heart size and vascularity are normal.  No pulmonary infiltrates.  IMPRESSION: Slight decrease in moderate bilateral pleural effusions.  Original Report Authenticated By: Gwynn Burly, M.D.   Dg Chest Port 1 View  05/24/2011  *RADIOLOGY REPORT*  Clinical Data:  CHF, pneumonia.  Chest congestion.  Post dialysis. Evaluate for change.  PORTABLE CHEST - 1 VIEW  Comparison: 05/24/2011  Findings: Support devices are in stable position.  Cardiomegaly with bilateral perihilar and lower lobe opacities and suspected layering effusions.  No change since prior study.  IMPRESSION: No significant change bilateral lower lobe opacities and effusions.  Original Report Authenticated By: Cyndie Chime, M.D.   Dg Chest Port 1 View  05/24/2011  *RADIOLOGY REPORT*  Clinical Data: Intubated.  PORTABLE CHEST - 1 VIEW  Comparison: 05/23/2011  Findings: No endotracheal tube is present.  Bilateral internal jugular central lines are in place, unchanged.  There is cardiomegaly with bilateral perihilar and lower lobe opacities. Suspect layering effusions.  No real change in the appearance of the lungs.  IMPRESSION: No interval change.  Original Report Authenticated By: Cyndie Chime, M.D.   MEDS  . albuterol  2.5 mg Nebulization Q6H  . antiseptic oral rinse  15 mL Mouth Rinse QID  . folic acid  1 mg Oral Daily  . furosemide  160 mg Intravenous Q12H  . insulin aspart  2-15 Units Subcutaneous TID WC & HS  . insulin glargine  10 Units Subcutaneous QHS  . ipratropium  0.5 mg Nebulization Q6H  . levothyroxine  75 mcg Oral QAC breakfast  . LORazepam  0.5 mg  Intravenous BID  . metronidazole  500 mg Intravenous Q8H  . multivitamin  5 mL Per Tube Daily  . pantoprazole sodium  40 mg Per Tube Q1200  . sodium chloride      . thiamine  100 mg Oral Daily  . vancomycin  500 mg Oral Q6H   Diet: pureed/ thin liquid  Activity PT/OT  Disposition:  Step down unit [facility] 8   Discharged Condition: stable  Signed: Sueko Dimichele,PETE 05/25/2011, 2:00 PM

## 2011-05-25 NOTE — Progress Notes (Signed)
CSW spoke with pt to provide support. Pt disoriented, reporting hallucinations. CSW comforted pt and offered reassurances. CSW will continue to follow.

## 2011-05-25 NOTE — Progress Notes (Signed)
Follow up - Critical Care Medicine Note  Patient Details:    [name] is an 77 y.o. female heavy drinker who family had not been in contact with for a day or so who was found down at home on 11/25 w/ altered MS, hypotension, hypoglycemic (20s) and  covered in coffee ground emesis.  Admitted to critical care service.   Lines, Airways, Drains: 11/25 L IJ >>>  11/25 Foley >>>  11/25 Radial arterial line >>>11/30  11/25 NGT >>>12/1  11/26 RIJ Dialysis catheter>>>  Microbiology: cdiff POS 11/28* Blood culture 05/17/11 >>>NEG 2/2   Anti-infectives:  11/25 Vancomycin >>>11/27 11/25 Zosyn >>>11/28 11/26 Flagyl>>>11/27 Flagyl 11/28>>> cipro 11/28>>>11/30 Oral vanc 11/29>>> vanc enema>>>  Events: 05/17/11 intubation >>>extubated11/30 05/18/11 Right IJ HD catheter placement, ARF, CVVHD, pressors reduced, PH improved  Studies: Ct Abdomen Pelvis Wo Contrast IMPRESSION: 1.  Small bilateral pleural effusions with dependent atelectasis. 2.  Right infrahilar airspace consolidation suspicious for pneumonia or aspiration. 3.  Atherosclerosis and coronary artery disease. 4.  Fatty liver. 5.  Old granulomatous disease of the spleen. 6.  Diffuse pan colitis.  Question C difficile colitis versus inflammatory bowel disease. 7.  Destructive changes of the SI joints.  Question seronegative spondyloarthropathy.  This could also represent severe degenerative changes. 8.  3 cm infrarenal abdominal aortic aneurysm.  Original Report Authenticated By: Andreas Newport, M.D.   Consults: Treatment Team:  Irena Cords, MD   Subjective:    Overnight Issues:   Objective:  Vital signs for last 24 hours: Temp:  [97.3 F (36.3 C)-99.7 F (37.6 C)] 98.6 F (37 C) (12/03 1300) Pulse Rate:  [80-105] 82  (12/03 1300) Resp:  [12-25] 15  (12/03 1300) BP: (90-133)/(52-84) 124/80 mmHg (12/03 1300) SpO2:  [96 %-100 %] 100 % (12/03 1300) Weight:  [51.8 kg (114 lb 3.2 oz)] 114 lb 3.2 oz (51.8 kg)  (12/03 0447)  Hemodynamic parameters for last 24 hours: CVP:  [12 mmHg-279 mmHg] 15 mmHg   Intake/Output Summary (Last 24 hours) at 05/25/11 1320 Last data filed at 05/25/11 1000  Gross per 24 hour  Intake   1760 ml  Output    615 ml  Net   1145 ml    Physical Exam:  General:  NAD , confused  Head:  NCAT Eyes:   PERRL Ears:  Intact, no discharge Nose:  No discharge Throat:  Midline trachea, no JVD, no LAD Pulmonary:  Coarse- rhonchi  CV:  RRR, no murmurs/rubs/gallops ABD:  (+)BS but very deminish, distended, nontender, no guarding, no rebound EXT:  Generalized edema with SCD on board  CXR: bibasilar effusions. A little better aeration when c/w prior film.    Assessment/Plan:  1.  Upper GI bleed. ? Gastritis. Has h/o ETOH.   Lab 05/25/11 0500 05/24/11 0500 05/23/11 0500  HGB 9.5* 10.6* 10.6*  plan: -cont PPI -trend Hgb  2.  Diffuse colitis -CDiff Positive   ESR = 5, ANA = Pending, Anti-dsDNA = <1, RF = <10,  C3 = 85 (L), C4 = 15 (L)   C diff colitis, NOW days and days into hospital course with some stool and? developed in house? Initial event? Plan: Continue oral vanc and IV continue flagyl, will stop vanc enemas Contact precautions  3. Respiratory failure, extubated 11/30 CXR No significant change bilateral lower lobe opacities and effusions. Plan: -maximize activity -keep negative fluid balance -wean FIO2 -PRN CXR  4.  Acute renal failure-renal following , in setting of  ATN, likely No  hydro on CT.  Recent Labs  Basename 05/25/11 0500 05/24/11 0500 05/23/11 1519   CREATININE 2.18*2.20* 1.03 1.08  Still anuric,  CVVHD stopped 12/2  Plan: -Lasix per renal w/ no sign UOP -Per renal HD if no UOP   5.  DM II  Lab 05/25/11 1205 05/25/11 0824 05/25/11 0411 05/25/11 0014 05/24/11 2029  GLUCAP 100* 76 123* 108* 98  plan: -Cont. SSI  6.  Alcoholism/Fatty Liver, At risk ETOH WD, this risk is reducing  Plan: -Thiamine, folic, mvi -NO CIWA in ICU in this  condition...only physician driven treatment -Limit tylenol  7.  Shock/sepsis (resolved)  8. Thrombocytopenia (improved)  Lab 05/25/11 0500 05/24/11 0500 05/23/11 0500 05/22/11 0330 05/21/11 2002  PLT 111* 54* 35* 34* 40*  likley sepsis, MODS related Dc any drugs that can cause, No hep on board.  Plan: - continue to monitor for signs of bleeding  9) Delirium/encephalopathy Improved but still confused ? Baseline  Plan: -supportive care   10) hypokalemia.  Lab 05/25/11 0500 05/24/11 0500 05/23/11 1519  K 3.64.0 3.3* 3.3*  plan: -replaced/monitor    LOS: 9 days   Family at bedside and updated  Son says pt has advanced directive, will speak with is sister who is POA   BABCOCK,PETE NP , PCCM  05/25/2011  Pt seen and examined and database reviewed. I agree with above findings, assessment and plan  Billy Fischer, MD;  PCCM service; [place] [phone]

## 2011-05-25 NOTE — Progress Notes (Signed)
Physical Therapy Treatment Patient Details Name: [name] MRN: [id number] DOB: 1933/10/11 Today's Date: 05/25/2011  PT Assessment/Plan  PT - Assessment/Plan Comments on Treatment Session: Patient is s/p multiple medical issues with decr. mobility secondary to deconditioning as well as poor cognition.  Pt. not oriented at all - disoriented x 4.  Will benefit from continued PT.   PT Plan: Discharge plan remains appropriate;Frequency remains appropriate PT Frequency: Min 3X/week Follow Up Recommendations: Inpatient Rehab Equipment Recommended: Defer to next venue PT Goals  Acute Rehab PT Goals PT Goal Formulation: With patient PT Goal: Supine/Side to Sit - Progress: Progressing toward goal PT Goal: Sit at Edge Of Bed - Progress: Progressing toward goal Pt will go Sit to Stand: with +2 total assist (pt = 50%) PT Goal: Sit to Stand - Progress: Other (comment) Pt will go Stand to Sit: with +2 total assist (pt = 50%) PT Goal: Stand to Sit - Progress: Other (comment) PT Goal: Stand - Progress: Other (comment) PT Goal: Perform Home Exercise Program - Progress: Other (comment)  PT Treatment Precautions/Restrictions  Precautions Precautions: Fall Precaution Comments: Still has lines/tubes but is extubated now and not on CVVHD. Required Braces or Orthoses: No Restrictions Weight Bearing Restrictions: No Mobility (including Balance) Bed Mobility Bed Mobility: Yes Rolling Right: 2: Max assist;With rail Rolling Right Details (indicate cue type and reason): Pt. needed max assist and max cues to roll over.   Rolling Left: 2: Max assist;With rail Supine to Sit: 1: +2 Total assist;Patient percentage (comment);HOB elevated (Comment degrees) (pt.= 10% and HOB at 20 degrees) Supine to Sit Details (indicate cue type and reason): Pt. pushing posteriorly significantly needing constant assist to sit at EOB.  Noted pt. with large amount of BM as well secondary to Flexi-seal was clamped. Nursing  notified and clamp removed.   Sit to Supine - Left: 1: +2 Total assist;Patient percentage (comment);HOB flat (pt. = 0%.) Scooting to HOB: 1: +2 Total assist;Patient percentage (comment) (pt = 0%) Scooting to [facility] Details (indicate cue type and reason): Needed cues to hold head off of bed but pt. did not help in any other way to scoot up in bed.  Transfers Transfers: No Ambulation/Gait Ambulation/Gait: No Stairs: No Wheelchair Mobility Wheelchair Mobility: No  Posture/Postural Control Posture/Postural Control: Postural limitations Postural Limitations: Poor trunk stability.  continues with extensor tone. Static Sitting Balance Static Sitting - Balance Support: No upper extremity supported;Feet unsupported (as pt. pushing posteriorly significantly) Static Sitting - Level of Assistance: 1: +2 Total assist;Patient percentage (comment) (pt. = 20 %.) Static Sitting - Comment/# of Minutes: Pt. sat on EOB x 3 minutes but pushing back so significantly did not sit long.  Also noted pt. lying in BM.  Assisted pt. back to supine and cleaned with total assist.  Then after cleaning pt. placed bed in 60 degree chair position  with pillows supporting pts' UEs and LEs.  Pt. had some active trunk flexion in this position.   Exercise    End of Session PT - End of Session Activity Tolerance: Patient limited by fatigue Patient left: in bed;with call bell in reach (in chair position) Nurse Communication: Mobility status for transfers;Need for lift equipment General Behavior During Session: [facility] for tasks performed Cognition: Impaired  INGOLD,Revia Nghiem 05/25/2011, 2:01 PM [facility] [phone] (pager)

## 2011-05-25 NOTE — Plan of Care (Signed)
Problem: Phase III Progression Outcomes Goal: Nutritional plan in place Bedside swallow eval completed: recommend initiating any textured diet per MD

## 2011-05-25 NOTE — Progress Notes (Signed)
Speech Pathology: Dysphagia Treatment Note  Patient was observed with : Pureed and Thin liquids.  Lung Sounds: Diminished per RN  Temperature: afebrile Chest X-ray: 05/24/11- bilateral lower lobe opacities and effusions.  Clinical Impression:  Diagnostic-Treatment of swallow function revealed  an overall functional oral-pharyngeal swallow with occasional tongue-pumping and delay in swallow trigger, however this appeared related to distractability and cognitive impairment, as other observed trials were timely and swift.  Solid textures were not administered in this visit, however do not expect significant difficulty. Patient has a baseline cough with oral expectoration of thin, clear/frothy secretions which was present both with and without PO trials, therefore increasing challenge of differentiating questionable aspiration versus baseline cough (likely the latter).  Aware of 5 day intubation, h/o esophageal dysphagia and cognitive impairment as risk factors for dysphagia/aspiration.  Will proceed with a PO diet with close assistance and monitoring to ensure diet safety and tolerance.  Recommendations:  1. Initiate PO diet of Regular/Thin liquids as MD deems appropriate 2. Full supervision at meals to ensure adequate intake and safe tolerance. 3. Medication whole in applesauce 4. SLP will continue to follow as recommended (2x/week/2weeks), addressing the following goals:  SLP goals: Patient will... 1.  Consume solids/thin liquids without any overt/consistent signs/symptoms of aspiration. 2.  Utilize compensatory strategies for safe consumption, including small bites, upright position and sustained attention, with moderate verbal redirection cues.   Pain:   none Intervention Required: No  Goals: Goals from evaluation met: see above for update.  Myra Rude, M.S.,CCC-SLP Pager [phone]

## 2011-05-25 NOTE — Progress Notes (Signed)
Subjective: Interval History: none.  Objective: Vital signs in last 24 hours:  Temp:  [96.6 F (35.9 C)-99.7 F (37.6 C)] 97.3 F (36.3 C) (12/03 0500) Pulse Rate:  [39-100] 82  (12/03 0500) Resp:  [12-24] 16  (12/03 0500) BP: (86-146)/(52-103) 120/66 mmHg (12/03 0500) SpO2:  [97 %-100 %] 100 % (12/03 0500) Weight:  [51.8 kg (114 lb 3.2 oz)] 114 lb 3.2 oz (51.8 kg) (12/03 0447)  Weight change: 0.2 kg (7.1 oz)  Intake/Output: I/O last 3 completed shifts: In: 5883 [I.V.:860; Other:1810; IV Piggyback:3213] Out: 4945 [Urine:65; Other:2120; Stool:2760]   Intake/Output this shift:     General: Well nourished/developed appearance female  Head: NCAT  Eyes: Eyes opening and tracking to stimulation, will follow simple commands  Ears: Intact, no discharge  Nose: No discharge  Throat: Midline trachea, no JVD, no LAD  Pulmonary: no rales audible no distress CV: RRR, no murmurs/rubs/gallops  ABD: (+)BS but very deminish, distended, nontender, no guarding, no rebound  EXT: No edema    Lab Results:  Basename 05/25/11 0500 05/24/11 0500 05/23/11 0500  WBC 20.3* 22.9* 21.0*  HGB 9.5* 10.6* 10.6*  HCT 26.5* 30.1* 30.3*  PLT 111* 54* 35*   BMET  Basename 05/25/11 0500 05/24/11 0500 05/23/11 1519  NA 134*134* 134* 138  K 3.64.0 3.3* 3.3*  CL 100100 101 105  CO2 2121 24 25  GLUCOSE 160*158* 135* 113*  BUN 25*25* 13 13  CREATININE 2.18*2.20* 1.03 1.08  CALCIUM 8.78.7 8.5 8.1*  PHOS 4.2 2.9 3.6   LFT  Basename 05/25/11 0500 05/24/11 0500  PROT -- 5.4*  ALBUMIN 2.5* --  AST -- 102*  ALT -- 42*  ALKPHOS -- 426*  BILITOT -- 3.9*  BILIDIR -- --  IBILI -- --   PT/INR No results found for this basename: LABPROT:2,INR:2 in the last 72 hours Hepatitis Panel No results found for this basename: HEPBSAG,HCVAB,HEPAIGM,HEPBIGM in the last 72 hours  Studies/Results: Dg Chest Port 1 View  05/24/2011  *RADIOLOGY REPORT*  Clinical Data: CHF, pneumonia.  Chest congestion.   Post dialysis. Evaluate for change.  PORTABLE CHEST - 1 VIEW  Comparison: 05/24/2011  Findings: Support devices are in stable position.  Cardiomegaly with bilateral perihilar and lower lobe opacities and suspected layering effusions.  No change since prior study.  IMPRESSION: No significant change bilateral lower lobe opacities and effusions.  Original Report Authenticated By: Cyndie Chime, M.D.   Dg Chest Port 1 View  05/24/2011  *RADIOLOGY REPORT*  Clinical Data: Intubated.  PORTABLE CHEST - 1 VIEW  Comparison: 05/23/2011  Findings: No endotracheal tube is present.  Bilateral internal jugular central lines are in place, unchanged.  There is cardiomegaly with bilateral perihilar and lower lobe opacities. Suspect layering effusions.  No real change in the appearance of the lungs.  IMPRESSION: No interval change.  Original Report Authenticated By: Cyndie Chime, M.D.   Dg Chest Port 1 View  05/23/2011  *RADIOLOGY REPORT*  Clinical Data: Extubation  PORTABLE CHEST - 1 VIEW  Comparison: May 22, 2011  Findings: The ET tube has been removed.  The right IJ sheath remains unchanged at the SVC level.  The left IJ central line is also unchanged at the SVC level.  Cardiomegaly and mild pulmonary edema persist.  Small bilateral effusions have increased in size, right greater than left.  IMPRESSION: Cardiomegaly and pulmonary edema.  Small bilateral effusions have increased, right greater than left.  Original Report Authenticated By: Brandon Melnick, M.D.  I have reviewed the patient's current medications.  Assessment/Plan: 1. oliguric ARF-BP better-CVVH currently  at (-)120 cc/hr (3L/d)--discontinue today. System clotted. Intermittent HD if needed. IV lasix 160 IV x 2 doses today. IV KCL  2. VDRF- extubated 3. Metabolic acidosis- off bicarb drip. Improved with CVVH.  4. Colitis- on flagyl, cipro, vanco. C diff +  5. SIRS- off pressors and on broad spectrum abx  6. ABLA- s/p transfusion hb dropped to  9.5 today  7. Etoh/fatty liver  8. UGI bleed- drop to 9.5 9. Disposition-looks better off pressors and with UF  10. Hypophosphatemia--off carafate.  11. Low platelet has improved to 111 12. Creatinine stable off CVVHD with very minimal urine output appears dry will follow     LOS: 9 Lerry Cordrey W @TODAY @7 :01 AM

## 2011-05-26 DIAGNOSIS — E119 Type 2 diabetes mellitus without complications: Secondary | ICD-10-CM | POA: Diagnosis present

## 2011-05-26 DIAGNOSIS — D696 Thrombocytopenia, unspecified: Secondary | ICD-10-CM

## 2011-05-26 DIAGNOSIS — K922 Gastrointestinal hemorrhage, unspecified: Secondary | ICD-10-CM | POA: Diagnosis present

## 2011-05-26 DIAGNOSIS — E781 Pure hyperglyceridemia: Secondary | ICD-10-CM

## 2011-05-26 LAB — GLUCOSE, CAPILLARY
Glucose-Capillary: 109 mg/dL — ABNORMAL HIGH (ref 70–99)
Glucose-Capillary: 110 mg/dL — ABNORMAL HIGH (ref 70–99)
Glucose-Capillary: 116 mg/dL — ABNORMAL HIGH (ref 70–99)
Glucose-Capillary: 157 mg/dL — ABNORMAL HIGH (ref 70–99)

## 2011-05-26 LAB — COMPREHENSIVE METABOLIC PANEL
Alkaline Phosphatase: 497 U/L — ABNORMAL HIGH (ref 39–117)
BUN: 34 mg/dL — ABNORMAL HIGH (ref 6–23)
Creatinine, Ser: 3.35 mg/dL — ABNORMAL HIGH (ref 0.50–1.10)
GFR calc Af Amer: 14 mL/min — ABNORMAL LOW (ref >90)
Glucose, Bld: 100 mg/dL — ABNORMAL HIGH (ref 70–99)
Potassium: 3.6 mEq/L (ref 3.5–5.1)
Total Bilirubin: 2.8 mg/dL — ABNORMAL HIGH (ref 0.3–1.2)
Total Protein: 5.5 g/dL — ABNORMAL LOW (ref 6.0–8.3)

## 2011-05-26 LAB — CBC
HCT: 26.2 % — ABNORMAL LOW (ref 36.0–46.0)
Hemoglobin: 9.1 g/dL — ABNORMAL LOW (ref 12.0–15.0)
MCHC: 34.7 g/dL (ref 30.0–36.0)
MCV: 90.7 fL (ref 78.0–100.0)

## 2011-05-26 MED ORDER — GLUCERNA SHAKE PO LIQD
237.0000 mL | Freq: Two times a day (BID) | ORAL | Status: DC
  Administered 2011-05-26 – 2011-06-01 (×9): 237 mL via ORAL

## 2011-05-26 MED ORDER — IPRATROPIUM BROMIDE 0.02 % IN SOLN: 0.5000 mg | Freq: Four times a day (QID) | RESPIRATORY_TRACT | Status: DC | PRN

## 2011-05-26 MED ORDER — PANTOPRAZOLE SODIUM 40 MG PO TBEC
40.0000 mg | DELAYED_RELEASE_TABLET | Freq: Every day | ORAL | Status: DC
  Administered 2011-05-26 – 2011-06-04 (×10): 40 mg via ORAL
  Filled 2011-05-26 (×12): qty 1

## 2011-05-26 MED ORDER — WHITE PETROLATUM GEL
Status: AC
  Administered 2011-05-26: 04:00:00
  Filled 2011-05-26: qty 5

## 2011-05-26 MED ORDER — ALBUTEROL SULFATE (5 MG/ML) 0.5% IN NEBU: 2.5000 mg | INHALATION_SOLUTION | Freq: Four times a day (QID) | RESPIRATORY_TRACT | Status: DC | PRN

## 2011-05-26 MED ORDER — THERA M PLUS PO TABS
1.0000 | ORAL_TABLET | Freq: Every day | ORAL | Status: DC
  Administered 2011-05-27 – 2011-06-04 (×9): 1 via ORAL
  Filled 2011-05-26 (×10): qty 1

## 2011-05-26 NOTE — Progress Notes (Signed)
Speech Language/Pathology  Dysphagia Treatment Note  Patient was observed with : regular lunch tray with thin liquids.  Patient was noted to have s/s of aspiration : intermittent cough, likely baseline congestion, with overall adequate toleration  Lung Sounds:  CTA bilaterally; congested cough Temperature: 99  Patient required: max assist for self-feeding and precautions Clinical Impression: Pt's daughter present during session.  Pt with persisting confusion, decreased safety secondary to impulsivity and poor awareness/self-regulation.  Pt required hand-over-hand assist for self-feeding with cup and utensils.  Tended to overload mouth with solid foods, then unable to masticate safely requiring portion of bolus to be manually removed.    Airway protection appears adequate; oral deficits due to described cognitive changes.   Recommendations:  1. Continue regular consistency diet with thin liquids 2.  Full supervision and assistance with feeding secondary to cognitive deficits  Pain:   none Intervention Required:   No  Aidan Caloca L. Samson Frederic, [place] CCC/SLP Pager [phone]

## 2011-05-26 NOTE — Progress Notes (Signed)
Subjective: Does not have any new complaints. Ate breakfast. Voice quite weak. Responses to questions are short.  Objective: Patient Vitals for the past 24 hrs:  BP Temp Temp src Pulse Resp SpO2 Weight  05/26/11 1000 100/77 mmHg 99 F (37.2 C) - 86  17  99 % -  05/26/11 0902 - - - - - 100 % -  05/26/11 0900 99/60 mmHg 99.1 F (37.3 C) - 86  16  100 % -  05/26/11 0800 - 98.4 F (36.9 C) - 87  17  99 % -  05/26/11 0700 126/95 mmHg 98.6 F (37 C) - 90  18  100 % -  05/26/11 0600 128/100 mmHg 98.2 F (36.8 C) - 95  17  99 % -  05/26/11 0500 117/74 mmHg 98.2 F (36.8 C) - 87  17  99 % 49.2 kg (108 lb 7.5 oz)  05/26/11 0400 124/71 mmHg 98.2 F (36.8 C) - - 15  - -  05/26/11 0300 129/71 mmHg 98.1 F (36.7 C) - - 16  - -  05/26/11 0200 93/80 mmHg 97.9 F (36.6 C) - 82  18  100 % -  05/26/11 0129 - - - - - 99 % -  05/26/11 0100 92/65 mmHg 97.9 F (36.6 C) - 86  19  99 % -  05/26/11 0000 104/65 mmHg 98.1 F (36.7 C) - 83  21  100 % -  05/25/11 2300 130/67 mmHg 97.9 F (36.6 C) - 83  11  100 % -  05/25/11 2200 89/48 mmHg 98.2 F (36.8 C) - 85  13  98 % -  05/25/11 2100 131/69 mmHg 97.9 F (36.6 C) - 85  12  100 % -  05/25/11 2025 106/55 mmHg - - 85  16  99 % -  05/25/11 2000 106/55 mmHg 98.1 F (36.7 C) - 83  13  100 % -  05/25/11 1900 100/59 mmHg 97.9 F (36.6 C) - 82  13  100 % -  05/25/11 1700 83/52 mmHg 98.6 F (37 C) - 83  14  100 % -  05/25/11 1600 102/64 mmHg 97.9 F (36.6 C) Core 83  16  100 % -  05/25/11 1500 93/58 mmHg 98.2 F (36.8 C) - 79  16  95 % -  05/25/11 1443 - - - - - 100 % -  05/25/11 1400 105/52 mmHg 96.8 F (36 C) - 87  18  98 % -  05/25/11 1300 124/80 mmHg 98.6 F (37 C) - 82  15  100 % -   Weight change: -2.6 kg (-5 lb 11.7 oz)  Intake/Output Summary (Last 24 hours) at 05/26/11 1242 Last data filed at 05/26/11 1000  Gross per 24 hour  Intake   1630 ml  Output    594 ml  Net   1036 ml    Physical Exam: General appearance: alert, cooperative  and no distress Eyes: conjunctivae/corneas clear. PERRL, EOM's intact.  Lungs: clear to auscultation bilaterally Heart: regular rate and rhythm, S1, S2 normal, no murmur, click, rub or gallop Abdomen: soft, non-tender; bowel sounds normal; no masses,  no organomegaly Extremities: extremities normal, atraumatic, no cyanosis or edema Neurologic: Grossly normal  Lab Results:  Basename 05/26/11 0630 05/25/11 0500 05/24/11 0500  NA 137 134*134* --  K 3.6 3.64.0 --  CL 102 100100 --  CO2 22 2121 --  GLUCOSE 100* 160*158* --  BUN 34* 25*25* --  CREATININE 3.35* 2.18*2.20* --  CALCIUM 8.5 8.78.7 --  MG -- 2.4 2.3  PHOS -- 4.2 2.9    Basename 05/26/11 0630 05/25/11 0500 05/24/11 0500  AST 81* -- 102*  ALT 33 -- 42*  ALKPHOS 497* -- 426*  BILITOT 2.8* -- 3.9*  PROT 5.5* -- 5.4*  ALBUMIN 2.4* 2.5* --   No results found for this basename: LIPASE:2,AMYLASE:2 in the last 72 hours  Basename 05/26/11 0630 05/25/11 0500  WBC 16.0* 20.3*  NEUTROABS -- --  HGB 9.1* 9.5*  HCT 26.2* 26.5*  MCV 90.7 89.5  PLT 123* 111*   No results found for this basename: CKTOTAL:3,CKMB:3,CKMBINDEX:3,TROPONINI:3 in the last 72 hours No results found for this basename: POCBNP:3 in the last 72 hours No results found for this basename: DDIMER:2 in the last 72 hours No results found for this basename: HGBA1C:2 in the last 72 hours No results found for this basename: CHOL:2,HDL:2,LDLCALC:2,TRIG:2,CHOLHDL:2,LDLDIRECT:2 in the last 72 hours No results found for this basename: TSH,T4TOTAL,FREET3,T3FREE,THYROIDAB in the last 72 hours No results found for this basename: VITAMINB12:2,FOLATE:2,FERRITIN:2,TIBC:2,IRON:2,RETICCTPCT:2 in the last 72 hours  Micro Results: Recent Results (from the past 240 hour(s))  MRSA PCR SCREENING     Status: Normal   Collection Time   05/17/11  1:45 AM      Component Value Range Status Comment   MRSA by PCR NEGATIVE  NEGATIVE  Final   CULTURE, BLOOD (ROUTINE X 2)      Status: Normal   Collection Time   05/17/11  7:36 AM      Component Value Range Status Comment   Specimen Description BLOOD LEFT HAND   Final    Special Requests BOTTLES DRAWN AEROBIC ONLY 8CC   Final    Setup Time [id number]   Final    Culture NO GROWTH 5 DAYS   Final    Report Status 05/23/2011 FINAL   Final   CULTURE, BLOOD (ROUTINE X 2)     Status: Normal   Collection Time   05/17/11  7:40 AM      Component Value Range Status Comment   Specimen Description BLOOD RIGHT WRIST ALINE   Final    Special Requests BOTTLES DRAWN AEROBIC AND ANAEROBIC 10CC   Final    Setup Time [id number]   Final    Culture NO GROWTH 5 DAYS   Final    Report Status 05/23/2011 FINAL   Final   STOOL CULTURE     Status: Normal   Collection Time   05/20/11  7:45 PM      Component Value Range Status Comment   Specimen Description STOOL   Final    Special Requests NONE   Final    Culture     Final    Value: NO SALMONELLA, SHIGELLA, CAMPYLOBACTER, OR YERSINIA ISOLATED     Note: REDUCED NORMAL FLORA PRESENT   Report Status 05/24/2011 FINAL   Final   CLOSTRIDIUM DIFFICILE BY PCR     Status: Abnormal   Collection Time   05/20/11  7:45 PM      Component Value Range Status Comment   C difficile by pcr POSITIVE (*) NEGATIVE  Final     Studies/Results:   Dg Chest Port 1 View  05/25/2011  *RADIOLOGY REPORT*  Clinical Data: Shortness of breath.  PORTABLE CHEST - 1 VIEW  Comparison: 05/24/2011  Findings: Right and left jugular vein catheter tips are in the superior vena cava.  The patient has moderate bilateral pleural effusions, slightly diminished.  Heart size and vascularity are normal.  No pulmonary infiltrates.  IMPRESSION: Slight decrease in moderate bilateral pleural effusions.  Original Report Authenticated By: Gwynn Burly, M.D.      Medications: Scheduled Meds:   . albuterol  2.5 mg Nebulization Q6H  . antiseptic oral rinse  15 mL Mouth Rinse QID  . folic acid  1 mg Oral Daily  . insulin  aspart  2-15 Units Subcutaneous TID WC & HS  . insulin glargine  10 Units Subcutaneous QHS  . ipratropium  0.5 mg Nebulization Q6H  . levothyroxine  75 mcg Oral QAC breakfast  . LORazepam  0.5 mg Intravenous BID  . metronidazole  500 mg Intravenous Q8H  . multivitamin  5 mL Per Tube Daily  . pantoprazole sodium  40 mg Per Tube Q1200  . sodium chloride      . thiamine  100 mg Oral Daily  . vancomycin  500 mg Oral Q6H  . white petrolatum      . DISCONTD: chlorhexidine  15 mL Mouth Rinse BID  . DISCONTD: insulin aspart  0-7 Units Subcutaneous Q4H  . DISCONTD: levothyroxine  75 mcg Per Tube QAC breakfast  . DISCONTD: vancomycin (VANCOCIN) rectal enema  500 mg Rectal Q6H   Continuous Infusions:   . sodium chloride 20 mL/hr at 05/26/11 1000  . DISCONTD: dextrose 20 mL/hr at 05/25/11 0446   PRN Meds:.acetaminophen (TYLENOL) oral liquid 160 mg/5 mL, haloperidol lactate, DISCONTD: sodium chloride, DISCONTD: fentaNYL  Assessment/Plan: Active Problems:  C. difficile colitis- On IV Flagyl and PO Vanc. Was receiving Vanc enemas as well but these were stopped 12/2.  Upper GI bleed and Anemia-  stable s/p transfusion. On PPI. She is an alcoholic and may have esophagitis or gastritis.    Acute renal failure-(ATN) She was oliguric and was on CVVH  Cr is worse. Nephrology is following. Was given IV Lasix 120 x 2 yesterday.    Encephalopathy acute- appears improved.   Acute respiratory failure- may have been aspiration as she presented w/ vomiting. extubated 11/30.  Changing nebs to PRN today. Titrate Fio2. Check pulse ox on room air.  Effusions on xray yesterday.  Incentive spirometry, ambulate.     Alcohol induced fatty liver Hyperbilirubinemia- maybe due to shock? Went up as high at 5.2 on 11/29 and now improving.  Shock/ Sepsis- resolved. Was due to GI bleed and C. Diff.  Thrombocytopenia- likely due to shock and now improving.   Hypothyroid  Hypokalemia  DM II-  A1c 6.6. Sugars are  reasonable on Lantus and Novolog  Hypertriglyceridemia- 615 earlier this admission on 11/26.    LOS: 10 days   Kimani Hovis 05/26/2011, 12:42 PM

## 2011-05-26 NOTE — Progress Notes (Signed)
Nutrition Follow-up  Patient was extubated 11/30.  Diet advanced to Heart Healthy today.  Tube feeding is off.  CVVHD discontinued 12/3.  Meds:   Scheduled Meds:   . albuterol  2.5 mg Nebulization Q6H  . antiseptic oral rinse  15 mL Mouth Rinse QID  . folic acid  1 mg Oral Daily  . insulin aspart  2-15 Units Subcutaneous TID WC & HS  . insulin glargine  10 Units Subcutaneous QHS  . ipratropium  0.5 mg Nebulization Q6H  . levothyroxine  75 mcg Oral QAC breakfast  . LORazepam  0.5 mg Intravenous BID  . metronidazole  500 mg Intravenous Q8H  . multivitamin  5 mL Per Tube Daily  . pantoprazole sodium  40 mg Per Tube Q1200  . sodium chloride      . thiamine  100 mg Oral Daily  . vancomycin  500 mg Oral Q6H  . white petrolatum      . DISCONTD: chlorhexidine  15 mL Mouth Rinse BID  . DISCONTD: insulin aspart  0-7 Units Subcutaneous Q4H  . DISCONTD: levothyroxine  75 mcg Per Tube QAC breakfast  . DISCONTD: vancomycin (VANCOCIN) rectal enema  500 mg Rectal Q6H   Continuous Infusions:   . sodium chloride 20 mL/hr at 05/26/11 1000  . DISCONTD: dextrose 20 mL/hr at 05/25/11 0446   PRN Meds:.acetaminophen (TYLENOL) oral liquid 160 mg/5 mL, haloperidol lactate, DISCONTD: sodium chloride, DISCONTD: fentaNYL   Labs:   CMP     Component Value Date/Time   NA 137 05/26/2011 0630   K 3.6 05/26/2011 0630   CL 102 05/26/2011 0630   CO2 22 05/26/2011 0630   GLUCOSE 100* 05/26/2011 0630   BUN 34* 05/26/2011 0630   CREATININE 3.35* 05/26/2011 0630   CALCIUM 8.5 05/26/2011 0630   PROT 5.5* 05/26/2011 0630   ALBUMIN 2.4* 05/26/2011 0630   AST 81* 05/26/2011 0630   ALT 33 05/26/2011 0630   ALKPHOS 497* 05/26/2011 0630   BILITOT 2.8* 05/26/2011 0630   GFRNONAA 12* 05/26/2011 0630   GFRAA 14* 05/26/2011 0630   CBG (last 3)   Basename 05/26/11 1208 05/26/11 0825 05/26/11 0437  GLUCAP 110* 109* 123*    Noted C. Diff positive.  Wt. Status:  49.2 kg (down from 58.7 kg on 11/27 with fluid  removal)  Re-estimated needs:  1200-1400 kcals, 50-60 grams protein daily  Diet:  Heart Healthy (just initiated today)  Patient passed swallow evaluation with SLP 12/3.  Tube feeding off.  Nutr dx:  Inadequate oral intake, ongoing.  Goal:  PO intake to meet 90-100% of re-estimated daily nutrition needs, unmet.  Intervention:  Glucerna Shake PO BID to maximize oral intake.  Monitor:  PO intake, labs, weight trend.   Pager #:  [phone]

## 2011-05-26 NOTE — Progress Notes (Signed)
Subjective: Interval History: has no complaint of SOB however patient still appears disorientated . She did not receive Ativan last night secondary to lethargy.  Objective: Vital signs in last 24 hours:  Temp:  [96.8 F (36 C)-99.1 F (37.3 C)] 98.2 F (36.8 C) (12/04 0500) Pulse Rate:  [79-105] 87  (12/04 0500) Resp:  [11-25] 17  (12/04 0500) BP: (83-143)/(48-84) 117/74 mmHg (12/04 0500) SpO2:  [95 %-100 %] 99 % (12/04 0500) Weight:  [49.2 kg (108 lb 7.5 oz)] 108 lb 7.5 oz (49.2 kg) (12/04 0500)  Weight change: -2.6 kg (-5 lb 11.7 oz)  Intake/Output: I/O last 3 completed shifts: In: 3970 [I.V.:760; Other:560; IV Piggyback:2650] Out: 1723 [Urine:170; Other:193; Stool:1360]   Intake/Output this shift:  Total I/O In: 310 [I.V.:210; IV Piggyback:100] Out: 155 [Urine:155]  General: Well nourished/developed appearance female  Head: NCAT  Eyes: Eyes opening and tracking to stimulation, will follow simple commands  Ears: Intact, no discharge  Nose: No discharge  Throat: Midline trachea, no JVD, no LAD  Pulmonary: no rales audible no distress  CV: RRR, no murmurs/rubs/gallops  ABD: (+)BS but very deminish, distended, nontender, no guarding, no rebound  EXT: No edema   Lab Results:  Basename 05/25/11 0500 05/24/11 0500  WBC 20.3* 22.9*  HGB 9.5* 10.6*  HCT 26.5* 30.1*  PLT 111* 54*   BMET  Basename 05/25/11 0500 05/24/11 0500 05/23/11 1519  NA 134*134* 134* 138  K 3.64.0 3.3* 3.3*  CL 100100 101 105  CO2 2121 24 25  GLUCOSE 160*158* 135* 113*  BUN 25*25* 13 13  CREATININE 2.18*2.20* 1.03 1.08  CALCIUM 8.78.7 8.5 8.1*  PHOS 4.2 2.9 3.6   LFT  Basename 05/25/11 0500 05/24/11 0500  PROT -- 5.4*  ALBUMIN 2.5* --  AST -- 102*  ALT -- 42*  ALKPHOS -- 426*  BILITOT -- 3.9*  BILIDIR -- --  IBILI -- --   PT/INR No results found for this basename: LABPROT:2,INR:2 in the last 72 hours Hepatitis Panel No results found for this basename:  HEPBSAG,HCVAB,HEPAIGM,HEPBIGM in the last 72 hours  Studies/Results: Dg Chest Port 1 View  05/25/2011  *RADIOLOGY REPORT*  Clinical Data: Shortness of breath.  PORTABLE CHEST - 1 VIEW  Comparison: 05/24/2011  Findings: Right and left jugular vein catheter tips are in the superior vena cava.  The patient has moderate bilateral pleural effusions, slightly diminished.  Heart size and vascularity are normal.  No pulmonary infiltrates.  IMPRESSION: Slight decrease in moderate bilateral pleural effusions.  Original Report Authenticated By: Gwynn Burly, M.D.   Dg Chest Port 1 View  05/24/2011  *RADIOLOGY REPORT*  Clinical Data: CHF, pneumonia.  Chest congestion.  Post dialysis. Evaluate for change.  PORTABLE CHEST - 1 VIEW  Comparison: 05/24/2011  Findings: Support devices are in stable position.  Cardiomegaly with bilateral perihilar and lower lobe opacities and suspected layering effusions.  No change since prior study.  IMPRESSION: No significant change bilateral lower lobe opacities and effusions.  Original Report Authenticated By: Cyndie Chime, M.D.   Dg Chest Port 1 View  05/24/2011  *RADIOLOGY REPORT*  Clinical Data: Intubated.  PORTABLE CHEST - 1 VIEW  Comparison: 05/23/2011  Findings: No endotracheal tube is present.  Bilateral internal jugular central lines are in place, unchanged.  There is cardiomegaly with bilateral perihilar and lower lobe opacities. Suspect layering effusions.  No real change in the appearance of the lungs.  IMPRESSION: No interval change.  Original Report Authenticated By: Cyndie Chime, M.D.  I have reviewed the patient's current medications.  Assessment/Plan: 1. oliguric ARF-patient appears stable. Will check creatinine and electrolytes this AM. 2. VDRF- extubated  3. Metabolic acidosis- improved 4. Colitis- on flagyl, cipro, vanco. C diff +  5. SIRS- off pressors and on broad spectrum abx  6. ABLA- s/p transfusion hb pending this AM  7. Etoh/fatty liver    8. UGI bleed- Hb pending  Patient continues to make urine although average appears only about 20cc per hour. She displays no evidence of volume overload. Her labs are pending this morning and will follow up on them later.   LOS: 10 Jonny Dearden W @TODAY @6 :25 AM

## 2011-05-27 ENCOUNTER — Inpatient Hospital Stay (HOSPITAL_COMMUNITY): Payer: Medicare Other

## 2011-05-27 DIAGNOSIS — K922 Gastrointestinal hemorrhage, unspecified: Secondary | ICD-10-CM

## 2011-05-27 DIAGNOSIS — R5381 Other malaise: Secondary | ICD-10-CM

## 2011-05-27 DIAGNOSIS — J9600 Acute respiratory failure, unspecified whether with hypoxia or hypercapnia: Secondary | ICD-10-CM

## 2011-05-27 LAB — CBC
HCT: 26.3 % — ABNORMAL LOW (ref 36.0–46.0)
MCH: 31.9 pg (ref 26.0–34.0)
MCHC: 35 g/dL (ref 30.0–36.0)
MCV: 91.3 fL (ref 78.0–100.0)
RDW: 17.8 % — ABNORMAL HIGH (ref 11.5–15.5)

## 2011-05-27 LAB — RENAL FUNCTION PANEL
BUN: 41 mg/dL — ABNORMAL HIGH (ref 6–23)
Calcium: 8.3 mg/dL — ABNORMAL LOW (ref 8.4–10.5)
Glucose, Bld: 93 mg/dL (ref 70–99)
Phosphorus: 6 mg/dL — ABNORMAL HIGH (ref 2.3–4.6)
Sodium: 135 mEq/L (ref 135–145)

## 2011-05-27 LAB — GLUCOSE, CAPILLARY
Glucose-Capillary: 93 mg/dL (ref 70–99)
Glucose-Capillary: 76 mg/dL (ref 70–99)

## 2011-05-27 MED ORDER — METRONIDAZOLE 500 MG PO TABS
500.0000 mg | ORAL_TABLET | Freq: Three times a day (TID) | ORAL | Status: DC
  Administered 2011-05-27 – 2011-06-04 (×22): 500 mg via ORAL
  Filled 2011-05-27 (×26): qty 1

## 2011-05-27 MED ORDER — SODIUM CHLORIDE 0.9 % IV SOLN: INTRAVENOUS | Status: DC

## 2011-05-27 NOTE — Progress Notes (Addendum)
Subjective: Patient seen and examined this am. Appears confused. Still having diarrhea, seems improved as per the nurse. Poor urine output noted past 24 hrs. Unable to obtain hx from patient.  Objective:  Vital signs in last 24 hours:  Filed Vitals:   05/26/11 2200 05/27/11 0500 05/27/11 0602 05/27/11 1353  BP: 109/66  121/83 134/86  Pulse: 85  82 93  Temp: 98.2 F (36.8 C)  97 F (36.1 C) 98.3 F (36.8 C)  TempSrc: Oral  Oral   Resp: 18  18 16   Height: 4\' 9"  (1.448 m)     Weight: 52.7 kg (116 lb 2.9 oz) 53 kg (116 lb 13.5 oz)    SpO2: 100%  98% 97%    Intake/Output from previous day:   Intake/Output Summary (Last 24 hours) at 05/27/11 1724 Last data filed at 05/27/11 1615  Gross per 24 hour  Intake    652 ml  Output   1300 ml  Net   -648 ml    Physical Exam:  General: elderly thin built female  in no acute distress. HEENT: no pallor, no icterus, moist oral mucosa, no JVD, no lymphadenopathy. IJ in place Heart: Normal  s1 &s2  Regular rate and rhythm, without murmurs, rubs, gallops. Lungs: Clear to auscultation bilaterally. Abdomen: Soft, distended , mild tenderness to palpation, positive bowel sounds. Extremities: No clubbing cyanosis or edema with positive pedal pulses. Neuro: Alert, awake, oriented to self only, nonfocal.   Lab Results:  Basic Metabolic Panel:    Component Value Date/Time   NA 135 05/27/2011 0630   K 3.6 05/27/2011 0630   CL 103 05/27/2011 0630   CO2 21 05/27/2011 0630   BUN 41* 05/27/2011 0630   CREATININE 4.14* 05/27/2011 0630   GLUCOSE 93 05/27/2011 0630   CALCIUM 8.3* 05/27/2011 0630   CBC:    Component Value Date/Time   WBC 13.8* 05/27/2011 0630   HGB 9.2* 05/27/2011 0630   HCT 26.3* 05/27/2011 0630   PLT 137* 05/27/2011 0630   MCV 91.3 05/27/2011 0630   NEUTROABS 21.7* 05/22/2011 0330   LYMPHSABS 1.6 05/22/2011 0330   MONOABS 3.2* 05/22/2011 0330   EOSABS 0.0 05/22/2011 0330   BASOSABS 0.0 05/22/2011 0330    Recent Results (from  the past 240 hour(s))  STOOL CULTURE     Status: Normal   Collection Time   05/20/11  7:45 PM      Component Value Range Status Comment   Specimen Description STOOL   Final    Special Requests NONE   Final    Culture     Final    Value: NO SALMONELLA, SHIGELLA, CAMPYLOBACTER, OR YERSINIA ISOLATED     Note: REDUCED NORMAL FLORA PRESENT   Report Status 05/24/2011 FINAL   Final   CLOSTRIDIUM DIFFICILE BY PCR     Status: Abnormal   Collection Time   05/20/11  7:45 PM      Component Value Range Status Comment   C difficile by pcr POSITIVE (*) NEGATIVE  Final     Studies/Results: Ct Abdomen Pelvis Wo Contrast  05/27/2011  *RADIOLOGY REPORT*  Clinical Data: Pancreatitis.  CT ABDOMEN AND PELVIS WITHOUT CONTRAST  Technique:  Multidetector CT imaging of the abdomen and pelvis was performed following the standard protocol without intravenous contrast.  Comparison: 05/17/2011  Findings: Imaging through the lung bases shows slight progression of bibasilar collapse / consolidation.  Small bilateral pleural effusions are noted.  The liver is upper normal for size and heterogeneous in  attenuation.  Calcified granulomata are noted in the spleen.  Tiny hiatal hernia noted.  Stomach otherwise unremarkable.  The duodenum, adrenal glands, and kidneys are unremarkable.  There is some high-density material in the neck of the gallbladder, raising the question gallstones.  The pancreatic head and body are unremarkable.  Pancreatic tail is somewhat ill-defined but there is no substantial peri pancreatic edema or inflammation.  Stable thickening of the adrenal glands noted.  Vascular calcification is seen in the hilum of each kidney.  No abdominal aortic aneurysm.  No retroperitoneal lymphadenopathy.  The diffuse colonic wall thickening seen in the abdominal segments of the colon on the previous study has essentially resolved.  There is some free fluid around the liver and spleen.  Imaging through the pelvis shows free  fluid.  A rectal tube is visualized.  Foley catheter decompresses the urinary bladder and air in the bladder is presumably secondary to the instrumentation. Diverticular changes are noted in the sigmoid colon without evidence for diverticulitis.  Terminal ileum is normal. The appendix is not visualized, but there is no edema or inflammation in the region of the cecum.  Body wall edema is seen in the pelvis.  Degenerative changes are noted in both SI joints and facets of the lower lumbar spine.  IMPRESSION: The diffuse colonic wall thickening seen previously has resolved in the interval.  There is some residual ascites.  Worsening bilateral lower lobe collapse / consolidation with effusions.  Question gallstones.  Original Report Authenticated By: ERIC A. MANSELL, M.D.    Medications: Scheduled Meds:   . antiseptic oral rinse  15 mL Mouth Rinse QID  . feeding supplement  237 mL Oral BID BM  . folic acid  1 mg Oral Daily  . insulin aspart  2-15 Units Subcutaneous TID WC & HS  . insulin glargine  10 Units Subcutaneous QHS  . levothyroxine  75 mcg Oral QAC breakfast  . LORazepam  0.5 mg Intravenous BID  . multivitamins ther. w/minerals  1 tablet Oral Daily  . pantoprazole  40 mg Oral Q1200  . thiamine  100 mg Oral Daily  . vancomycin  500 mg Oral Q6H   Continuous Infusions:   . sodium chloride 20 mL/hr at 05/26/11 2000  . sodium chloride 100 mL/hr at 05/27/11 1436   PRN Meds:.acetaminophen (TYLENOL) oral liquid 160 mg/5 mL, albuterol, haloperidol lactate, ipratropium  Assessment/Plan: 77 y.o. female who is a heavy drinkerwas found down at home on 11/25 with altered MS, hypotension, hypoglycemic ( fsg in 20s) and covered in coffee ground emesis. Admitted to ICU on 11/25 for resp failure and  septic shock intubate and extubated on 11/30. Hospital course complicated with cdiff pancolitis and oliguric renal failure.   cdiff  Pancolitis pancolitis from CT 11/25 On po vanco. Pt was also on IV  flagyl ( started on 11/29 after c diff pos) ( unclear when it was discontinued) will restart flagyl to complete at least 14 day course Off vanco enema Repeat abd CT done on 12/5 with improvment in colitis  Upper GI bleed with anemia  currently stable S/p PRBC and initially treated with PPI drip and octeotide Cont PPI Possible sec to etoh gastritis Will need outpt GI eval for EGD Seen by Dr Dickie La on 11/25. EGD done on 11/25 showing severe chr gastritis  Oliguric ATN Patient had CVVHD in ICU. Last done on 12/03  being follwed by renal  creatinine further worsening to 4.1 today  poor UOP on 12/4 of 130cc  only. Has improved today to about 350 cc since this morning  holding  HD for now as per renal 2D echo done this admission with normal LV fn Monitor urine outpt closely Renal fn in am  Acute respiratory failure-  Resolved. Possibly related to aspiration extubated 11/30.  Prn nebs B/l mild Effusions on xray  and atalectasis Incentive spirometry, ambulate.    Confusion with encephalopathy  improving slowly as per daughter  likely in the setting of septic shock. Will closely monitor  Alcohol induced fatty liver and  Hyperbilirubinemia-and transaminitis  ? Shock liver now improving.   Thrombocytopenia - likely due to shock and etoh abuse   now improving.   Hypokalemia  Resolved  DM II-  A1c 6.6. fsg stable  on Lantus and Novolog   Hypertriglyceridemia-  615 earlier this admission on 11/26.       LOS: 11 days   Marzella Miracle 05/27/2011, 5:24 PM   dispo  to rehab once acute issues resolve Discussed plan of care with daughter in detail today ( 12/5)

## 2011-05-27 NOTE — Progress Notes (Signed)
Physical Therapy Treatment Patient Details Name: [name] MRN: [id number] DOB: [birth year] Today's Date: 05/27/2011  PT Assessment/Plan  PT - Assessment/Plan Comments on Treatment Session: Still has difficulty following commands secondary to cognitiion. PT Plan: Discharge plan remains appropriate PT Frequency: Min 3X/week Follow Up Recommendations: Inpatient Rehab Equipment Recommended: Defer to next venue PT Goals  Acute Rehab PT Goals PT Goal: Supine/Side to Sit - Progress: Progressing toward goal PT Goal: Sit at Edge Of Bed - Progress: Progressing toward goal PT Goal: Sit to Stand - Progress: Progressing toward goal PT Goal: Stand to Sit - Progress: Progressing toward goal PT Goal: Stand - Progress: Progressing toward goal PT Goal: Perform Home Exercise Program - Progress: Progressing toward goal  PT Treatment Precautions/Restrictions  Precautions Precautions: Fall Precaution Comments: Still has lines/tubes but is extubated now and not on CVVHD. Required Braces or Orthoses: No Restrictions Weight Bearing Restrictions: No Mobility (including Balance) Bed Mobility Supine to Sit: 1: +2 Total assist Supine to Sit Details (indicate cue type and reason): +2totalpt20% with max encouragement and flexion cues as pt extending through head/neck/trunk and pushing posteriorly through arms Sit to Supine - Left: 1: +2 Total assist Sit to Supine - Left Details (indicate cue type and reason): +2totalpt0% Scooting to HOB: 1: +2 Total assist Scooting to [facility] Details (indicate cue type and reason): pt0% Transfers Transfers: Yes Sit to Stand: 1: +2 Total assist Sit to Stand Details (indicate cue type and reason): attempted sit<->stand today but pt not extending through legs, really just dead weight today regardless of cueing Stand to Sit: 1: +2 Total assist Stand to Sit Details: 0%  Posture/Postural Control Postural Limitations: pushes posteriorly, extensor tone Static Sitting  Balance Static Sitting - Level of Assistance: 2: Max assist Static Sitting - Comment/# of Minutes: pt sat EOB x10 minutes with about 7-8 minutes pt needing maxA-+2totalpt0% to maintain upright secondary to pt pushing and arching posteriorly; had pt attempt reaching for feet to encourage hip flexion; pt able to sit up with mingaurdA with for 1-2 minutes after these acticities Exercise  Total Joint Exercises Ankle Circles/Pumps: AROM;Both;5 reps;Supine End of Session PT - End of Session Equipment Utilized During Treatment: Gait belt Activity Tolerance: Patient limited by fatigue Patient left: in bed Nurse Communication: Mobility status for transfers General Behavior During Session: Agitated Cognition: Impaired Cognitive Impairment: poor following commands and attention to task  [facility] HELEN 05/27/2011, 2:28 PM

## 2011-05-27 NOTE — Progress Notes (Signed)
Subjective: Interval History: has  no complaint of SOB however patient still appears disorientated . Marland Kitchen  Objective: Vital signs in last 24 hours:  Temp:  [97 F (36.1 C)-99.1 F (37.3 C)] 97 F (36.1 C) (12/05 0602) Pulse Rate:  [82-119] 82  (12/05 0602) Resp:  [15-20] 18  (12/05 0602) BP: (94-143)/(46-123) 121/83 mmHg (12/05 0602) SpO2:  [96 %-100 %] 98 % (12/05 0602) Weight:  [52.7 kg (116 lb 2.9 oz)-53 kg (116 lb 13.5 oz)] 116 lb 13.5 oz (53 kg) (12/05 0500)  Weight change: 3.5 kg (7 lb 11.5 oz)  Intake/Output: I/O last 3 completed shifts: In: 1130 [P.O.:120; I.V.:910; IV Piggyback:100] Out: 1209 [Urine:309; Stool:900]   Intake/Output this shift:     General: Well nourished/developed appearance female  Head: NCAT  Eyes: Eyes opening and tracking to stimulation, will follow simple commands  Ears: Intact, no discharge  Nose: No discharge  Throat: Midline trachea, no JVD, no LAD  Pulmonary: no rales audible no distress  CV: RRR, no murmurs/rubs/gallops  ABD: (+)BS but very deminish, distended, nontender, no guarding, no rebound  EXT: No edema   Lab Results:  Basename 05/27/11 0630 05/26/11 0630 05/25/11 0500  WBC 13.8* 16.0* 20.3*  HGB 9.2* 9.1* 9.5*  HCT 26.3* 26.2* 26.5*  PLT 137* 123* 111*   BMET  Basename 05/27/11 0630 05/26/11 0630 05/25/11 0500  NA 135 137 134*134*  K 3.6 3.6 3.64.0  CL 103 102 100100  CO2 21 22 2121  GLUCOSE 93 100* 160*158*  BUN 41* 34* 25*25*  CREATININE 4.14* 3.35* 2.18*2.20*  CALCIUM 8.3* 8.5 8.78.7  PHOS 6.0* -- 4.2   LFT  Basename 05/27/11 0630 05/26/11 0630  PROT -- 5.5*  ALBUMIN 2.4* --  AST -- 81*  ALT -- 33  ALKPHOS -- 497*  BILITOT -- 2.8*  BILIDIR -- --  IBILI -- --   PT/INR No results found for this basename: LABPROT:2,INR:2 in the last 72 hours Hepatitis Panel No results found for this basename: HEPBSAG,HCVAB,HEPAIGM,HEPBIGM in the last 72 hours  Studies/Results: No results found.  I have  reviewed the patient's current medications.  Assessment/Plan: 1. oliguric ARF-patient appears stable. Will check creatinine and electrolytes this AM.  2. VDRF- extubated  3. Metabolic acidosis- improved  4. Colitis- on flagyl, cipro, vanco. C diff +  5. SIRS- off pressors and on broad spectrum abx  6. ABLA- s/p transfusion hb pending this AM  7. Etoh/fatty liver  8. UGI bleed- Hb pending   Patient appears to making more urine there are no urgent indications for dialysis today and will follow to see if she will recover      LOS: 11 Jwan Hornbaker W @TODAY @9 :39 AM

## 2011-05-27 NOTE — Progress Notes (Signed)
IV team currently in with pt. Will follow up as time allows today. Ivonne Andrew PT, DPT [phone]

## 2011-05-27 NOTE — Consult Note (Signed)
Physical Medicine and Rehabilitation Consult Reason for Consult: Deconditioned Referring Phsyician: Dr. Sharon Seller    HPI: [name] is an 77 y.o. female with history of alcohol abuse, diabetes found down at home with AMS and coffee ground emesis on clothes and furniture. Admitted 05/16/11 and note to have BS 23 with hypotension and hypothermia. Intubated and required pressors. Dr. Juanda Chance consulted for input on unstable GIB.  Treated with IVF, 2u PRBC, ocreotide and platelets.  EGD with evidence of severe gastritis and esophagitis, no gastric varices.  Patient developed abdominal distension on 11/25 due to pancolitis and started on flagyl as on IV antibiotics.  She did develop oliguric renal failure requiring CVVHD.  Vent wean initiated 11/29 and patient extubated 11/30. Sepsis due to MODS resolving. Noted to be decondtioned and lethargy/delirium due encephalopathy resolving.  Urine output improving and CVVHD discontinued yesterday.  MD, PT recommending CIR.  Review of Systems  Respiratory: Positive for cough (Congested cough noted.).   Gastrointestinal:       Rectal tube in place  Musculoskeletal: Positive for myalgias.       Discomfort with ROM  BLE.  Neurological: Positive for weakness.       Delayed processing with cognitive deficits noted.   @ROS @ Past Medical History  Diagnosis Date  . Abdominal pain   . Hypothyroid   . Diabetes mellitus   . COPD (chronic obstructive pulmonary disease)   . GERD (gastroesophageal reflux disease)    Past Surgical History  Procedure Date  . Abdominal hysterectomy   . Esophagogastroduodenoscopy 05/17/2011    Procedure: ESOPHAGOGASTRODUODENOSCOPY (EGD);  Surgeon: Hart Carwin, MD;  Location: [facility] ENDOSCOPY;  Service: Endoscopy;  Laterality: N/A;   Family History  Problem Relation Age of Onset  . Stroke Mother    Social History:   Live alone. She reports that she has been smoking Cigarettes  Approximately 12 PPD.  She does not have any  smokeless tobacco history on file. She reports that she drinks alcohol (as much as she wants). She reports that she does not use illicit drugs.   No Known Allergies  Prior to Admission medications   Medication Sig Start Date End Date Taking? Authorizing Provider  levothyroxine (SYNTHROID, LEVOTHROID) 75 MCG tablet Take 75 mcg by mouth daily.     Yes Historical Provider, MD  nateglinide (STARLIX) 120 MG tablet Take 120 mg by mouth 3 (three) times daily before meals.     Yes Historical Provider, MD  potassium chloride (KLOR-CON) 20 MEQ packet Take 20 mEq by mouth 2 (two) times daily.     Yes Historical Provider, MD   Scheduled Medications:    . antiseptic oral rinse  15 mL Mouth Rinse QID  . feeding supplement  237 mL Oral BID BM  . folic acid  1 mg Oral Daily  . insulin aspart  2-15 Units Subcutaneous TID WC & HS  . insulin glargine  10 Units Subcutaneous QHS  . levothyroxine  75 mcg Oral QAC breakfast  . LORazepam  0.5 mg Intravenous BID  . multivitamins ther. w/minerals  1 tablet Oral Daily  . pantoprazole  40 mg Oral Q1200  . thiamine  100 mg Oral Daily  . vancomycin  500 mg Oral Q6H  . DISCONTD: albuterol  2.5 mg Nebulization Q6H  . DISCONTD: ipratropium  0.5 mg Nebulization Q6H  . DISCONTD: multivitamin  5 mL Per Tube Daily  . DISCONTD: pantoprazole sodium  40 mg Per Tube Q1200   PRN MED's: acetaminophen (TYLENOL) oral  liquid 160 mg/5 mL, albuterol, haloperidol lactate, ipratropium Home: Home Living:  Lives alone.  Independent for mobility with cane.  Independent for ADLs.  Reports that sisters assist with home management and she eats out daily.  Functional History:   Functional Status:  Mobility: Bed Mobility Bed Mobility: Yes Rolling Right: 2: Max assist;With rail Rolling Right Details (indicate cue type and reason): Pt. needed max assist and max cues to roll over.   Rolling Left: 2: Max assist;With rail Supine to Sit: 1: +2 Total assist;Patient percentage  (comment);HOB elevated (Comment degrees) (pt.= 10% and HOB at 20 degrees) Supine to Sit Details (indicate cue type and reason): Pt. pushing posteriorly significantly needing constant assist to sit at EOB.  Noted pt. with large amount of BM as well secondary to Flexi-seal was clamped. Nursing notified and clamp removed.   Sit to Supine - Left: 1: +2 Total assist;Patient percentage (comment);HOB flat (pt. = 0%.) Scooting to HOB: 1: +2 Total assist;Patient percentage (comment) (pt = 0%) Scooting to [facility] Details (indicate cue type and reason): Needed cues to hold head off of bed but pt. did not help in any other way to scoot up in bed.  Transfers Transfers: No Ambulation/Gait Ambulation/Gait: No Stairs: No Wheelchair Mobility Wheelchair Mobility: No  ADL:    Cognition: Cognition Arousal/Alertness: Awake/alert Orientation Level: Oriented X4 Cognition Arousal/Alertness: Awake/alert Overall Cognitive Status: Impaired (secondary to ventilator) Difficult to assess due to: intubated Attention: Impaired Current Attention Level: Focused Attention - Other Comments: seconds at a time Orientation Level: Oriented X4 Following Commands: Follows one step commands with increased time Safety/Judgement: Decreased awareness of safety precautions;Decreased safety judgement for tasks assessed Decreased Safety/Judgement: Impulsive Safety/Judgement - Other Comments: Restless in bed.  Multiple lines and tubes. Problem Solving: Requires assistance for problem solving Cognition - Other Comments: Difficult to accurately assess secondary to ventilator.  Blood pressure 121/83, pulse 82, temperature 97 F (36.1 C), temperature source Oral, resp. rate 18, height 4\' 9"  (1.448 m), weight 53 kg (116 lb 13.5 oz), last menstrual period 05/17/1983, SpO2 98.00%. Physical Exam  Constitutional: She appears lethargic.  Neurological: She appears lethargic. She displays abnormal reflex. No cranial nerve deficit.        Patient is extremely lethargic upon my examination. She moves all 4 extremities. She does arouse to verbal and tactile stimulation. Muscle no focal weakness or sensory changes. She withdraws to pain.      Assessment/Plan: Diagnosis: Deconditioning related to GI bleed and acute respiratory failure. 1. Does the need for close, 24 hr/day medical supervision in concert with the patient's rehab needs make it unreasonable for this patient to be served in a less intensive setting? Potentially 2. Co-Morbidities requiring supervision/potential complications: Colitis gastritis anemia and renal failure 3. Due to bladder management, bowel management, skin/wound care and disease management, does the patient require 24 hr/day rehab nursing? Potentially 4. Does the patient require coordinated care of a physician, rehab nurse, PT (1-2 hrs/day, 5 days/week) and OT (1-2 hrs/day, 5 days/week) to address physical and functional deficits in the context of the above medical diagnosis(es)? No and Potentially Addressing deficits in the following areas: balance, bathing, bowel/bladder control, dressing, locomotion, strength and transferring 5. Can the patient actively participate in an intensive therapy program of at least 3 hrs of therapy per day at least 5 days per week? No and Potentially 6. The potential for patient to make measurable gains while on inpatient rehab is fair 7. Anticipated functional outcomes upon discharge from inpatients are minimal assistance  PT, minimal to moderate assistance OT,  8. Estimated rehab length of stay to reach the above functional goals is: 2+ week 9. Does the patient have adequate social supports to accommodate these discharge functional goals? No and Potentially 10. Anticipated D/C setting: Home 11. Anticipated post D/C treatments: HH therapy 12. Overall Rehab/Functional Prognosis: fair  RECOMMENDATIONS: This patient's condition is appropriate for continued rehabilitative care in  the following setting: SNF Patient has agreed to participate in recommended program. Potentially Note that insurance prior authorization may be required for reimbursement for recommended care.  Comment: Patient is extremely lethargic today. Given her social history and current functional examinations I would be surprised if she would be able to tolerate inpatient rehabilitation therapies. Disposition is also in question. We'll follow along    05/27/2011

## 2011-05-28 LAB — HEPATIC FUNCTION PANEL
ALT: 26 U/L (ref 0–35)
Alkaline Phosphatase: 394 U/L — ABNORMAL HIGH (ref 39–117)
Bilirubin, Direct: 1.5 mg/dL — ABNORMAL HIGH (ref 0.0–0.3)
Indirect Bilirubin: 0.7 mg/dL (ref 0.3–0.9)
Total Protein: 5.5 g/dL — ABNORMAL LOW (ref 6.0–8.3)

## 2011-05-28 LAB — CBC
MCH: 31.1 pg (ref 26.0–34.0)
MCHC: 34.5 g/dL (ref 30.0–36.0)
MCV: 90.3 fL (ref 78.0–100.0)
Platelets: 143 10*3/uL — ABNORMAL LOW (ref 150–400)
RBC: 2.89 MIL/uL — ABNORMAL LOW (ref 3.87–5.11)
WBC: 13.1 10*3/uL — ABNORMAL HIGH (ref 4.0–10.5)

## 2011-05-28 LAB — GLUCOSE, CAPILLARY
Glucose-Capillary: 82 mg/dL (ref 70–99)
Glucose-Capillary: 100 mg/dL — ABNORMAL HIGH (ref 70–99)

## 2011-05-28 LAB — RENAL FUNCTION PANEL
CO2: 21 mEq/L (ref 19–32)
Calcium: 8.6 mg/dL (ref 8.4–10.5)
Creatinine, Ser: 4.72 mg/dL — ABNORMAL HIGH (ref 0.50–1.10)
GFR calc Af Amer: 10 mL/min — ABNORMAL LOW (ref >90)
Glucose, Bld: 69 mg/dL — ABNORMAL LOW (ref 70–99)

## 2011-05-28 MED ORDER — POTASSIUM CHLORIDE CRYS ER 20 MEQ PO TBCR
40.0000 meq | EXTENDED_RELEASE_TABLET | Freq: Once | ORAL | Status: AC
  Administered 2011-05-28: 40 meq via ORAL
  Filled 2011-05-28: qty 2

## 2011-05-28 MED ORDER — LORAZEPAM 0.5 MG PO TABS
0.5000 mg | ORAL_TABLET | Freq: Two times a day (BID) | ORAL | Status: DC
  Administered 2011-05-28 – 2011-06-04 (×12): 0.5 mg via ORAL
  Filled 2011-05-28 (×14): qty 1

## 2011-05-28 MED ORDER — POTASSIUM CHLORIDE 20 MEQ PO PACK
40.0000 meq | PACK | Freq: Once | ORAL | Status: DC
  Filled 2011-05-28: qty 2

## 2011-05-28 NOTE — Progress Notes (Signed)
Speech Language Pathology Speech Pathology: Dysphagia Treatment Note  Patient was observed with : Soft and Thin liquids.  Patient was noted to have s/s of aspiration : No    Lung Sounds:  Clear  Temperature: afebrile  Clinical Impression: Demonstrates a functional oral-pharyngeal swallow as initial concerns for aspiration risk have subsided as patient's overall conditioning and mentation have improved.  Completed education with patient and her daughter regarding initial purpose of SLP services and goals that have been met.  Recommendations:  1.  D/C skilled SLP services at this time.  Pain:   none Intervention Required:   No  Goals: All Goals Met  Myra Rude, M.S.,CCC-SLP Pager [phone]

## 2011-05-28 NOTE — Progress Notes (Signed)
Rehab admissions - Evaluated for possible admission.  Please see rehab consult done by Dr. Riley Kill recommending SNF.  Patient is not able to tolerate 3 hours of therapy a day.  Insurance carrier will not authorize an inpatient rehab admission.  Agree with need for SNF when medically ready.  Pager [phone]

## 2011-05-28 NOTE — Progress Notes (Signed)
This patient was discussed at long LOS rounds 12.05.12  

## 2011-05-28 NOTE — Progress Notes (Signed)
Subjective: Interval History: has no complaint of Shotness of breath. Still diarrhea.  Objective: Vital signs in last 24 hours:  Temp:  [97.9 F (36.6 C)-98.3 F (36.8 C)] 97.9 F (36.6 C) (12/06 0512) Pulse Rate:  [80-93] 80  (12/06 0512) Resp:  [16-18] 18  (12/06 0512) BP: (132-150)/(84-102) 132/84 mmHg (12/06 0512) SpO2:  [95 %-97 %] 97 % (12/06 0512) Weight:  [51.4 kg (113 lb 5.1 oz)] 113 lb 5.1 oz (51.4 kg) (12/06 0513)  Weight change: -1.3 kg (-2 lb 13.9 oz)  Intake/Output: I/O last 3 completed shifts: In: 720 [P.O.:120; I.V.:600] Out: 1500 [Urine:600; Stool:900]   Intake/Output this shift:     General: Well nourished/developed appearance female  Head: NCAT  Eyes: Eyes opening and tracking to stimulation, will follow simple commands  Ears: Intact, no discharge  Nose: No discharge  Throat: Midline trachea, no JVD, no LAD  Pulmonary: no rales audible no distress  CV: RRR, no murmurs/rubs/gallops  ABD: (+)BS but very deminish, distended, nontender, no guarding, no rebound  EXT: No edema   Lab Results:  Basename 05/28/11 0610 05/27/11 0630 05/26/11 0630  WBC 13.1* 13.8* 16.0*  HGB 9.0* 9.2* 9.1*  HCT 26.1* 26.3* 26.2*  PLT 143* 137* 123*   BMET  Basename 05/28/11 0610 05/27/11 0630 05/26/11 0630  NA 137 135 137  K 3.4* 3.6 3.6  CL 105 103 102  CO2 21 21 22   GLUCOSE 69* 93 100*  BUN 47* 41* 34*  CREATININE 4.72* 4.14* 3.35*  CALCIUM 8.6 8.3* 8.5  PHOS 6.6* 6.0* --   LFT  Basename 05/28/11 0610 05/26/11 0630  PROT -- 5.5*  ALBUMIN 2.3* --  AST -- 81*  ALT -- 33  ALKPHOS -- 497*  BILITOT -- 2.8*  BILIDIR -- --  IBILI -- --   PT/INR No results found for this basename: LABPROT:2,INR:2 in the last 72 hours Hepatitis Panel No results found for this basename: HEPBSAG,HCVAB,HEPAIGM,HEPBIGM in the last 72 hours  Studies/Results: Ct Abdomen Pelvis Wo Contrast  05/27/2011  *RADIOLOGY REPORT*  Clinical Data: Pancreatitis.  CT ABDOMEN AND PELVIS  WITHOUT CONTRAST  Technique:  Multidetector CT imaging of the abdomen and pelvis was performed following the standard protocol without intravenous contrast.  Comparison: 05/17/2011  Findings: Imaging through the lung bases shows slight progression of bibasilar collapse / consolidation.  Small bilateral pleural effusions are noted.  The liver is upper normal for size and heterogeneous in attenuation.  Calcified granulomata are noted in the spleen.  Tiny hiatal hernia noted.  Stomach otherwise unremarkable.  The duodenum, adrenal glands, and kidneys are unremarkable.  There is some high-density material in the neck of the gallbladder, raising the question gallstones.  The pancreatic head and body are unremarkable.  Pancreatic tail is somewhat ill-defined but there is no substantial peri pancreatic edema or inflammation.  Stable thickening of the adrenal glands noted.  Vascular calcification is seen in the hilum of each kidney.  No abdominal aortic aneurysm.  No retroperitoneal lymphadenopathy.  The diffuse colonic wall thickening seen in the abdominal segments of the colon on the previous study has essentially resolved.  There is some free fluid around the liver and spleen.  Imaging through the pelvis shows free fluid.  A rectal tube is visualized.  Foley catheter decompresses the urinary bladder and air in the bladder is presumably secondary to the instrumentation. Diverticular changes are noted in the sigmoid colon without evidence for diverticulitis.  Terminal ileum is normal. The appendix is not visualized, but  there is no edema or inflammation in the region of the cecum.  Body wall edema is seen in the pelvis.  Degenerative changes are noted in both SI joints and facets of the lower lumbar spine.  IMPRESSION: The diffuse colonic wall thickening seen previously has resolved in the interval.  There is some residual ascites.  Worsening bilateral lower lobe collapse / consolidation with effusions.  Question  gallstones.  Original Report Authenticated By: ERIC A. MANSELL, M.D.    I have reviewed the patient's current medications.  Assessment/Plan: oliguric ARF-patient appears stable. Creatinine continues to increase. Will still monitor 2. VDRF- extubated  3. Metabolic acidosis- improved  4. Colitis- on flagyl, cipro, vanco. C diff +  5. SIRS- off pressors and on broad spectrum abx  6. ABLA- s/p transfusion hb pending this AM  7. Etoh/fatty liver  8. UGI bleed- Hb pending  Patient continues to make urine . She displays no evidence of volume overload.       LOS: 12 [name] W @TODAY @8 :41 AM

## 2011-05-28 NOTE — Progress Notes (Addendum)
Subjective: Patient seen and examined this am. She appears much more oriented today. Is irritated during conversation.  Objective:  Vital signs in last 24 hours:  Filed Vitals:   05/27/11 1353 05/27/11 2136 05/28/11 0512 05/28/11 0513  BP: 134/86 150/102 132/84   Pulse: 93 89 80   Temp: 98.3 F (36.8 C) 98.1 F (36.7 C) 97.9 F (36.6 C)   TempSrc:  Oral Oral   Resp: 16 16 18    Height:      Weight:    51.4 kg (113 lb 5.1 oz)  SpO2: 97% 95% 97%     Intake/Output from previous day:   Intake/Output Summary (Last 24 hours) at 05/28/11 0854 Last data filed at 05/28/11 0500  Gross per 24 hour  Intake    380 ml  Output    550 ml  Net   -170 ml    Physical Exam:  General: elderly thin built female in no acute distress. Irritated at times on questioning HEENT: no pallor, no icterus, moist oral mucosa, no JVD, no lymphadenopathy. IJ in place  Heart: Normal s1 &s2 Regular rate and rhythm, without murmurs, rubs, gallops.  Lungs: diminished breath sounds at lung bases Abdomen: Soft, distended , mild tenderness to palpation, positive bowel sounds. Foley and rectal tube in place Extremities: No clubbing cyanosis or edema with positive pedal pulses.  Neuro: Alert, awake, and oriented x 3 today, nonfocal.   Lab Results:  Basic Metabolic Panel:    Component Value Date/Time   NA 137 05/28/2011 0610   K 3.4* 05/28/2011 0610   CL 105 05/28/2011 0610   CO2 21 05/28/2011 0610   BUN 47* 05/28/2011 0610   CREATININE 4.72* 05/28/2011 0610   GLUCOSE 69* 05/28/2011 0610   CALCIUM 8.6 05/28/2011 0610   CBC:    Component Value Date/Time   WBC 13.1* 05/28/2011 0610   HGB 9.0* 05/28/2011 0610   HCT 26.1* 05/28/2011 0610   PLT 143* 05/28/2011 0610   MCV 90.3 05/28/2011 0610   NEUTROABS 21.7* 05/22/2011 0330   LYMPHSABS 1.6 05/22/2011 0330   MONOABS 3.2* 05/22/2011 0330   EOSABS 0.0 05/22/2011 0330   BASOSABS 0.0 05/22/2011 0330    Recent Results (from the past 240 hour(s))  STOOL CULTURE      Status: Normal   Collection Time   05/20/11  7:45 PM      Component Value Range Status Comment   Specimen Description STOOL   Final    Special Requests NONE   Final    Culture     Final    Value: NO SALMONELLA, SHIGELLA, CAMPYLOBACTER, OR YERSINIA ISOLATED     Note: REDUCED NORMAL FLORA PRESENT   Report Status 05/24/2011 FINAL   Final   CLOSTRIDIUM DIFFICILE BY PCR     Status: Abnormal   Collection Time   05/20/11  7:45 PM      Component Value Range Status Comment   C difficile by pcr POSITIVE (*) NEGATIVE  Final     Studies/Results: Ct Abdomen Pelvis Wo Contrast  05/27/2011  *RADIOLOGY REPORT*  Clinical Data: Pancreatitis.  CT ABDOMEN AND PELVIS WITHOUT CONTRAST  Technique:  Multidetector CT imaging of the abdomen and pelvis was performed following the standard protocol without intravenous contrast.  Comparison: 05/17/2011  Findings: Imaging through the lung bases shows slight progression of bibasilar collapse / consolidation.  Small bilateral pleural effusions are noted.  The liver is upper normal for size and heterogeneous in attenuation.  Calcified granulomata are noted in  the spleen.  Tiny hiatal hernia noted.  Stomach otherwise unremarkable.  The duodenum, adrenal glands, and kidneys are unremarkable.  There is some high-density material in the neck of the gallbladder, raising the question gallstones.  The pancreatic head and body are unremarkable.  Pancreatic tail is somewhat ill-defined but there is no substantial peri pancreatic edema or inflammation.  Stable thickening of the adrenal glands noted.  Vascular calcification is seen in the hilum of each kidney.  No abdominal aortic aneurysm.  No retroperitoneal lymphadenopathy.  The diffuse colonic wall thickening seen in the abdominal segments of the colon on the previous study has essentially resolved.  There is some free fluid around the liver and spleen.  Imaging through the pelvis shows free fluid.  A rectal tube is visualized.  Foley  catheter decompresses the urinary bladder and air in the bladder is presumably secondary to the instrumentation. Diverticular changes are noted in the sigmoid colon without evidence for diverticulitis.  Terminal ileum is normal. The appendix is not visualized, but there is no edema or inflammation in the region of the cecum.  Body wall edema is seen in the pelvis.  Degenerative changes are noted in both SI joints and facets of the lower lumbar spine.  IMPRESSION: The diffuse colonic wall thickening seen previously has resolved in the interval.  There is some residual ascites.  Worsening bilateral lower lobe collapse / consolidation with effusions.  Question gallstones.  Original Report Authenticated By: ERIC A. MANSELL, M.D.    Medications: Scheduled Meds:   . antiseptic oral rinse  15 mL Mouth Rinse QID  . feeding supplement  237 mL Oral BID BM  . folic acid  1 mg Oral Daily  . insulin aspart  2-15 Units Subcutaneous TID WC & HS  . insulin glargine  10 Units Subcutaneous QHS  . levothyroxine  75 mcg Oral QAC breakfast  . LORazepam  0.5 mg Intravenous BID  . metroNIDAZOLE  500 mg Oral Q8H  . multivitamins ther. w/minerals  1 tablet Oral Daily  . pantoprazole  40 mg Oral Q1200  . thiamine  100 mg Oral Daily  . vancomycin  500 mg Oral Q6H   Continuous Infusions:   . sodium chloride 20 mL/hr at 05/27/11 2000  . DISCONTD: sodium chloride 100 mL/hr at 05/27/11 1436   PRN Meds:.acetaminophen (TYLENOL) oral liquid 160 mg/5 mL, albuterol, haloperidol lactate, ipratropium   Assessment 77 y.o. female who is a heavy drinkerwas found down at home on 11/25 with altered MS, hypotension, hypoglycemic ( fsg in 20s) and covered in coffee ground emesis. Admitted to ICU on 11/25 for resp failure and septic shock intubate and extubated on 11/30. Hospital course complicated with cdiff pancolitis and oliguric renal failure.   Plan:   cdiff Pancolitis  pancolitis from CT 11/25  On po vanco. ( since 11/29)  Pt was also on IV flagyl ( started on 11/29 after c diff pos , order expired briefly on 12/5)  restarted flagyl to complete at least 14 day course for now ( until 12/12) Off vanco enema  Repeat abd CT done on 12/5 with improvment in colitis . Shows worsening atelectasis/ b/l effusion) and gallstones and ascites leucocytosis improving  Upper GI bleed with anemia  currently stable  S/p PRBC and initially treated with PPI drip and octeotide  Cont PPI  Possible sec to etoh gastritis  Will need outpt GI eval for EGD  Seen by Dr Dickie La on 11/25. EGD done on 11/25 showing severe chr  gastritis   Oliguric ATN  Patient had CVVHD in ICU. Last done on 12/03  being follwed by renal  creatinine further worsening to 4.7 today  poor UOP on 12/4 of 130cc only. Has improved to 550 past 24 hrs.  Discussed with renal today (12/06) recommend holding on HD again today as she doesnot clinically appear volume overloaded 2D echo done this admission with normal LV fn  Repeat CT abd and pelvis on 12/05 showing b/l pleural effusion that has increased along with ascites. This could explained with fluid retention. Also she does has hypoalbuminemia. No pedal edema on exam. Monitor urine outpt closely  Repeat Renal fn in am   Acute respiratory failure-  Resolved. Possibly related to aspiration extubated 11/30.  Prn nebs  B/l mild Effusions on xray and atalectasis ,. Repeat CT abd on 12/5 showing increased atalectasis and effusion .  Incentive spirometry, ambulate.   Confusion with encephalopathy  improving slowly as per daughter . She appears much oriented today  likely in the setting of septic shock. Will closely monitor  She is irritated during conversation. Cont haldol prn for agitation.  Alcohol induced fatty liver and  Hyperbilirubinemia-and transaminitis  ? Shock liver . She also has findings of gallstones on CT abd but no RUQ tenderness on exam. Will repeat LFTs  Thrombocytopenia  - likely due to shock  and etoh abuse now improving.   Hypokalemia  k of 3.4 today. Will replenish  DM II-  A1c 6.6. fsg stable on Lantus and Novolog   Hypertriglyceridemia-  615 earlier this admission on 11/26.    DVT prophylaxis SCD boots  Daughter involved in care . Plan updated on 12/5 . Will update her again today.  Dispo: to SNF once stable clinically   LOS: 12 days   [name] 05/28/2011, 8:54 AM

## 2011-05-28 NOTE — Progress Notes (Signed)
Clinical Social Worker completed psychosocial assessment and placed in shadow chart. Pt daughter would like SNF search in [facility] as that is where she resides. Clinical Social Worker completed FL-2 (MD please sign) and initiated SNF search in [facility]. Pt daughter interested in [facility]. Clinical Social Worker to contact facilities pt daughter interested in. Clinical Social Worker to follow up with pt daughter in regard to bed offers. Clinical Social Worker to facilitate pt D/C needs when pt medically ready for D/C.  Jacklynn Lewis, MSW, LCSWA  Clinical Social Work [phone]

## 2011-05-29 LAB — DIFFERENTIAL
Basophils Absolute: 0.1 10*3/uL (ref 0.0–0.1)
Basophils Relative: 0 % (ref 0–1)
Eosinophils Absolute: 0 10*3/uL (ref 0.0–0.7)
Eosinophils Relative: 0 % (ref 0–5)
Neutrophils Relative %: 75 % (ref 43–77)

## 2011-05-29 LAB — RENAL FUNCTION PANEL
Albumin: 2.4 g/dL — ABNORMAL LOW (ref 3.5–5.2)
BUN: 51 mg/dL — ABNORMAL HIGH (ref 6–23)
Creatinine, Ser: 4.69 mg/dL — ABNORMAL HIGH (ref 0.50–1.10)
GFR calc non Af Amer: 8 mL/min — ABNORMAL LOW (ref >90)
Phosphorus: 6.6 mg/dL — ABNORMAL HIGH (ref 2.3–4.6)
Potassium: 4.3 mEq/L (ref 3.5–5.1)

## 2011-05-29 LAB — GLUCOSE, CAPILLARY
Glucose-Capillary: 79 mg/dL (ref 70–99)
Glucose-Capillary: 131 mg/dL — ABNORMAL HIGH (ref 70–99)
Glucose-Capillary: 69 mg/dL — ABNORMAL LOW (ref 70–99)
Glucose-Capillary: 88 mg/dL (ref 70–99)

## 2011-05-29 LAB — CBC
MCH: 31.9 pg (ref 26.0–34.0)
MCV: 92.5 fL (ref 78.0–100.0)
Platelets: 150 10*3/uL (ref 150–400)
RDW: 18.6 % — ABNORMAL HIGH (ref 11.5–15.5)

## 2011-05-29 NOTE — Progress Notes (Signed)
Subjective: Patient seen and examined this am. informs feeling better. Diarrhea improving.   Objective:  Vital signs in last 24 hours:  Filed Vitals:   05/28/11 0513 05/28/11 1414 05/28/11 2100 05/29/11 0700  BP:  116/74 138/87 136/87  Pulse:  90 108 88  Temp:  97.8 F (36.6 C) 98 F (36.7 C) 97.1 F (36.2 C)  TempSrc:  Oral Oral Oral  Resp:  18 18 16   Height:      Weight: 51.4 kg (113 lb 5.1 oz)   54.1 kg (119 lb 4.3 oz)  SpO2:  96% 95% 96%    Intake/Output from previous day:   Intake/Output Summary (Last 24 hours) at 05/29/11 1248 Last data filed at 05/29/11 0700  Gross per 24 hour  Intake    160 ml  Output    550 ml  Net   -390 ml    Physical Exam: General: elderly thin built female in no acute distress. Irritated at times on questioning  HEENT: no pallor, no icterus, moist oral mucosa, no JVD, no lymphadenopathy. IJ in place  Heart: Normal s1 &s2 Regular rate and rhythm, without murmurs, rubs, gallops.  Lungs: diminished breath sounds at lung bases  Abdomen: Soft, distended , non tender to palpation today , positive bowel sounds. Foley and rectal tube in place  Extremities: No clubbing cyanosis or edema with positive pedal pulses.  Neuro: Alert, awake, and oriented x 3 today, nonfocal.     Lab Results:  Basic Metabolic Panel:    Component Value Date/Time   NA 139 05/29/2011 0535   K 4.3 05/29/2011 0535   CL 108 05/29/2011 0535   CO2 19 05/29/2011 0535   BUN 51* 05/29/2011 0535   CREATININE 4.69* 05/29/2011 0535   GLUCOSE 79 05/29/2011 0535   CALCIUM 8.6 05/29/2011 0535   CBC:    Component Value Date/Time   WBC 13.4* 05/29/2011 0535   HGB 8.9* 05/29/2011 0535   HCT 25.8* 05/29/2011 0535   PLT 150 05/29/2011 0535   MCV 92.5 05/29/2011 0535   NEUTROABS 10.1* 05/29/2011 0535   LYMPHSABS 1.8 05/29/2011 0535   MONOABS 1.5* 05/29/2011 0535   EOSABS 0.0 05/29/2011 0535   BASOSABS 0.1 05/29/2011 0535    Recent Results (from the past 240 hour(s))  STOOL CULTURE      Status: Normal   Collection Time   05/20/11  7:45 PM      Component Value Range Status Comment   Specimen Description STOOL   Final    Special Requests NONE   Final    Culture     Final    Value: NO SALMONELLA, SHIGELLA, CAMPYLOBACTER, OR YERSINIA ISOLATED     Note: REDUCED NORMAL FLORA PRESENT   Report Status 05/24/2011 FINAL   Final   CLOSTRIDIUM DIFFICILE BY PCR     Status: Abnormal   Collection Time   05/20/11  7:45 PM      Component Value Range Status Comment   C difficile by pcr POSITIVE (*) NEGATIVE  Final     Studies/Results: Ct Abdomen Pelvis Wo Contrast  05/27/2011  *RADIOLOGY REPORT*  Clinical Data: Pancreatitis.  CT ABDOMEN AND PELVIS WITHOUT CONTRAST  Technique:  Multidetector CT imaging of the abdomen and pelvis was performed following the standard protocol without intravenous contrast.  Comparison: 05/17/2011  Findings: Imaging through the lung bases shows slight progression of bibasilar collapse / consolidation.  Small bilateral pleural effusions are noted.  The liver is upper normal for size and heterogeneous in  attenuation.  Calcified granulomata are noted in the spleen.  Tiny hiatal hernia noted.  Stomach otherwise unremarkable.  The duodenum, adrenal glands, and kidneys are unremarkable.  There is some high-density material in the neck of the gallbladder, raising the question gallstones.  The pancreatic head and body are unremarkable.  Pancreatic tail is somewhat ill-defined but there is no substantial peri pancreatic edema or inflammation.  Stable thickening of the adrenal glands noted.  Vascular calcification is seen in the hilum of each kidney.  No abdominal aortic aneurysm.  No retroperitoneal lymphadenopathy.  The diffuse colonic wall thickening seen in the abdominal segments of the colon on the previous study has essentially resolved.  There is some free fluid around the liver and spleen.  Imaging through the pelvis shows free fluid.  A rectal tube is visualized.  Foley  catheter decompresses the urinary bladder and air in the bladder is presumably secondary to the instrumentation. Diverticular changes are noted in the sigmoid colon without evidence for diverticulitis.  Terminal ileum is normal. The appendix is not visualized, but there is no edema or inflammation in the region of the cecum.  Body wall edema is seen in the pelvis.  Degenerative changes are noted in both SI joints and facets of the lower lumbar spine.  IMPRESSION: The diffuse colonic wall thickening seen previously has resolved in the interval.  There is some residual ascites.  Worsening bilateral lower lobe collapse / consolidation with effusions.  Question gallstones.  Original Report Authenticated By: ERIC A. MANSELL, M.D.    Medications: Scheduled Meds:   . antiseptic oral rinse  15 mL Mouth Rinse QID  . feeding supplement  237 mL Oral BID BM  . folic acid  1 mg Oral Daily  . insulin aspart  2-15 Units Subcutaneous TID WC & HS  . insulin glargine  10 Units Subcutaneous QHS  . levothyroxine  75 mcg Oral QAC breakfast  . LORazepam  0.5 mg Oral BID  . metroNIDAZOLE  500 mg Oral Q8H  . multivitamins ther. w/minerals  1 tablet Oral Daily  . pantoprazole  40 mg Oral Q1200  . thiamine  100 mg Oral Daily  . vancomycin  500 mg Oral Q6H   Continuous Infusions:   . sodium chloride 20 mL/hr at 05/29/11 1132   PRN Meds:.acetaminophen (TYLENOL) oral liquid 160 mg/5 mL, albuterol, haloperidol lactate, ipratropium  Assessment  77 y.o. female who is a heavy drinkerwas found down at home on 11/25 with altered MS, hypotension, hypoglycemic ( fsg in 20s) and covered in coffee ground emesis. Admitted to ICU on 11/25 for resp failure and septic shock intubate and extubated on 11/30. Hospital course complicated with cdiff pancolitis and oliguric renal failure.   Plan:  cdiff Pancolitis  pancolitis from CT 11/25  On po vanco. ( since 11/29) Pt was also on IV flagyl ( started on 11/29 after c diff pos ,  order expired briefly on 12/5) restarted flagyl to complete at least 14 day course for now ( until 12/12)  Off vanco enema  Repeat abd CT done on 12/5 with improvment in colitis . Shows worsening atelectasis/ b/l effusion) and gallstones and ascites  leucocytosis improving  Diarrhea improving as well. Will d/c rectal tube today  Upper GI bleed with anemia  currently stable  S/p PRBC and initially treated with PPI drip and octeotide  Cont PPI  Possible sec to etoh gastritis  Will need outpt GI follow up Seen by Dr Dickie La on 11/25. EGD done  on 11/25 showing severe chr gastritis   Oliguric ATN  Patient had CVVHD in ICU. Last done on 12/03  being follwed by renal  creatinine further worsened to 4.7 on 12/6 , has been steady at 4.7today. She continues to make urine . Again had 550cc urine output past 24 hrs -with renal  recommend holding on HD again today as she doesnot clinically appear volume overloaded  2D echo done this admission with normal LV fn  Repeat CT abd and pelvis on 12/05 showing b/l pleural effusion that has increased along with ascites. This could explained with fluid retention. Also she does has hypoalbuminemia. No pedal edema on exam.  Monitor urine outpt closely  Repeat Renal fn in am   Acute respiratory failure-  Resolved. Possibly related to aspiration extubated 11/30.  Prn nebs  B/l mild Effusions on xray and atalectasis ,. Repeat CT abd on 12/5 showing increased atalectasis and effusion .  Incentive spirometry, ambulate.  tolerating PT now  Confusion with encephalopathy  improving slowly as per daughter . She appears much oriented for past 48 hrs likely in the setting of septic shock. Will closely monitor  . Cont haldol prn for agitation.   Alcohol induced fatty liver and  Hyperbilirubinemia-and transaminitis  ? Shock liver . She also has findings of gallstones on CT abd but no RUQ tenderness on exam.  repeat LFTs on 12/6 slowly improving  Thrombocytopenia  -  likely due to shock and etoh abuse now improving.   Hypokalemia  Resolved today  DM II-  A1c 6.6. fsg stable on Lantus and Novolog   Hypertriglyceridemia-  615 earlier this admission on 11/26.   DVT prophylaxis SCD boots   Daughter involved in care . Plan updated on 12/5 and 12/6 .  Dispo: to SNF once stable clinically and renal function improved      LOS: 13 days   [name] 05/29/2011, 12:48 PM

## 2011-05-29 NOTE — Progress Notes (Signed)
Subjective: Interval History: none.  Objective: Vital signs in last 24 hours:  Temp:  [97.1 F (36.2 C)-98 F (36.7 C)] 97.1 F (36.2 C) (12/07 0700) Pulse Rate:  [88-108] 88  (12/07 0700) Resp:  [16-18] 16  (12/07 0700) BP: (116-138)/(74-87) 136/87 mmHg (12/07 0700) SpO2:  [95 %-96 %] 96 % (12/07 0700) Weight:  [54.1 kg (119 lb 4.3 oz)] 119 lb 4.3 oz (54.1 kg) (12/07 0700)  Weight change: 2.7 kg (5 lb 15.2 oz)  Intake/Output: I/O last 3 completed shifts: In: 578 [P.O.:310; I.V.:268] Out: 750 [Urine:750]   Intake/Output this shift:     General: Well nourished/developed appearance female  Head: NCAT  Eyes: Eyes opening and tracking to stimulation, will follow simple commands  Ears: Intact, no discharge  Nose: No discharge  Throat: Midline trachea, no JVD, no LAD  Pulmonary: no rales audible no distress  CV: RRR, no murmurs/rubs/gallops  ABD: (+)BS but very deminish, distended, nontender, no guarding, no rebound  EXT: No edema   Lab Results:  Basename 05/29/11 0535 05/28/11 0610 05/27/11 0630  WBC 13.4* 13.1* 13.8*  HGB 8.9* 9.0* 9.2*  HCT 25.8* 26.1* 26.3*  PLT 150 143* 137*   BMET  Basename 05/29/11 0535 05/28/11 0610 05/27/11 0630  NA 139 137 135  K 4.3 3.4* 3.6  CL 108 105 103  CO2 19 21 21   GLUCOSE 79 69* 93  BUN 51* 47* 41*  CREATININE 4.69* 4.72* 4.14*  CALCIUM 8.6 8.6 8.3*  PHOS 6.6* 6.6* 6.0*   LFT  Basename 05/29/11 0535 05/28/11 0610  PROT -- 5.5*  ALBUMIN 2.4* --  AST -- 69*  ALT -- 26  ALKPHOS -- 394*  BILITOT -- 2.2*  BILIDIR -- 1.5*  IBILI -- 0.7   PT/INR No results found for this basename: LABPROT:2,INR:2 in the last 72 hours Hepatitis Panel No results found for this basename: HEPBSAG,HCVAB,HEPAIGM,HEPBIGM in the last 72 hours  Studies/Results: Ct Abdomen Pelvis Wo Contrast  05/27/2011  *RADIOLOGY REPORT*  Clinical Data: Pancreatitis.  CT ABDOMEN AND PELVIS WITHOUT CONTRAST  Technique:  Multidetector CT imaging of the abdomen  and pelvis was performed following the standard protocol without intravenous contrast.  Comparison: 05/17/2011  Findings: Imaging through the lung bases shows slight progression of bibasilar collapse / consolidation.  Small bilateral pleural effusions are noted.  The liver is upper normal for size and heterogeneous in attenuation.  Calcified granulomata are noted in the spleen.  Tiny hiatal hernia noted.  Stomach otherwise unremarkable.  The duodenum, adrenal glands, and kidneys are unremarkable.  There is some high-density material in the neck of the gallbladder, raising the question gallstones.  The pancreatic head and body are unremarkable.  Pancreatic tail is somewhat ill-defined but there is no substantial peri pancreatic edema or inflammation.  Stable thickening of the adrenal glands noted.  Vascular calcification is seen in the hilum of each kidney.  No abdominal aortic aneurysm.  No retroperitoneal lymphadenopathy.  The diffuse colonic wall thickening seen in the abdominal segments of the colon on the previous study has essentially resolved.  There is some free fluid around the liver and spleen.  Imaging through the pelvis shows free fluid.  A rectal tube is visualized.  Foley catheter decompresses the urinary bladder and air in the bladder is presumably secondary to the instrumentation. Diverticular changes are noted in the sigmoid colon without evidence for diverticulitis.  Terminal ileum is normal. The appendix is not visualized, but there is no edema or inflammation in the region  of the cecum.  Body wall edema is seen in the pelvis.  Degenerative changes are noted in both SI joints and facets of the lower lumbar spine.  IMPRESSION: The diffuse colonic wall thickening seen previously has resolved in the interval.  There is some residual ascites.  Worsening bilateral lower lobe collapse / consolidation with effusions.  Question gallstones.  Original Report Authenticated By: ERIC A. MANSELL, M.D.    I  have reviewed the patient's current medications.  Assessment/Plan: 1. ARF - Creatinine at plateau Will still monitor  2. VDRF- extubated  3. Metabolic acidosis- improved  4. Colitis- on flagyl, cipro, vanco. C diff +  5. SIRS- off pressors and on broad spectrum abx  6. ABLA- s/p transfusion hb pending this AM  7. Etoh/fatty liver  8. UGI bleed- Hb pending  Patient continues to make urine . She displays no evidence of volume overload. Her creatinine is stable this Am      LOS: 13 [name] W @TODAY @8 :10 AM

## 2011-05-29 NOTE — Progress Notes (Signed)
Physical Therapy Treatment Patient Details Name: [name] MRN: [id number] DOB: [birth year] Today's Date: 05/29/2011  PT Assessment/Plan  PT - Assessment/Plan Comments on Treatment Session: Will try [facility] next visit as pt with poor awareness of body/positioning for stand PT Plan: Discharge plan remains appropriate Equipment Recommended: Defer to next venue PT Goals  Acute Rehab PT Goals PT Goal: Supine/Side to Sit - Progress: Progressing toward goal PT Goal: Sit at Edge Of Bed - Progress: Progressing toward goal PT Goal: Sit to Stand - Progress: Progressing toward goal PT Goal: Stand to Sit - Progress: Progressing toward goal PT Goal: Stand - Progress: Progressing toward goal PT Goal: Perform Home Exercise Program - Progress: Progressing toward goal  PT Treatment Precautions/Restrictions  Precautions Precautions: Fall Precaution Comments: Cognition puts pt at major risk of falls Required Braces or Orthoses: No Restrictions Weight Bearing Restrictions: No Mobility (including Balance) Bed Mobility Bed Mobility: Yes Rolling Right: 3: Mod assist Rolling Right Details (indicate cue type and reason): mod facilitatory cues for flexion and rotation; use of pad for full roll Rolling Left: 3: Mod assist Rolling Left Details (indicate cue type and reason): modA facilitatory cues for flexion and trunk rotation Supine to Sit: HOB elevated (Comment degrees);1: +1 Total assist Supine to Sit Details (indicate cue type and reason): +2totalpt30% with faciliatory cues for sequencing  Scooting to HOB: 1: +2 Total assist Scooting to [facility] Details (indicate cue type and reason): Pt approx 10%, difficulty following commands and decreased use of LE/UE's to assist even after VC/TC and hand placement Transfers Sit to Stand: 1: +2 Total assist;From bed;From elevated surface Sit to Stand Details (indicate cue type and reason): +2totalpt20% with bilateral knee block as pt extending through legs  but poor body control extending through trunk pushing posteriorly; full stand not accomplished; will try [facility] next visit Stand to Sit: To bed Stand to Sit Details: +2totalpt0% to control sit to bed Stand Pivot Transfers: 1: +2 Total assist Stand Pivot Transfer Details (indicate cue type and reason): +2totalpt5% with max faciliation at hips for hip extension and knee block; pt extending through legs and pushing posteriorly throughout transfer; try [employer] Ambulation/Gait Ambulation/Gait: No  Static Sitting Balance Static Sitting - Balance Support: No upper extremity supported;Bilateral upper extremity supported Static Sitting - Level of Assistance: 2: Max assist Static Sitting - Comment/# of Minutes: pt sat EOB 5-8 minutes with 1-2 minutes ability to sit with minA; pushes with LUE, no righting reactions but realizes she is falling because is fearful; max facilitation for hip flexion attempted reachin but pt easily distracting repeating "just wait a minute" Exercise  Total Joint Exercises Ankle Circles/Pumps: AROM;Both;5 reps Heel Slides: AROM;Both;5 reps;Supine General Exercises - Upper Extremity Shoulder Flexion: Both;10 reps;Supine;AAROM Shoulder Extension: AAROM;Both;10 reps;Supine;Other (comment) (HOB elevated for all ther ex) Elbow Flexion: Both;10 reps;Supine;AROM Elbow Extension: Both;10 reps;Supine;AROM Digit Composite Flexion: AROM;Both;5 reps;Supine Composite Extension: AROM;Both;5 reps;Supine End of Session PT - End of Session Equipment Utilized During Treatment: Gait belt Activity Tolerance: Patient limited by fatigue (cognition, fatigues quickly EOb) Patient left: in chair;with call bell in reach (chair alarm) Nurse Communication: Mobility status for transfers General Behavior During Session: [facility] for tasks performed Cognition: Impaired Cognitive Impairment: poor attention to task  [facility] HELEN 05/29/2011, 1:48 PM

## 2011-05-29 NOTE — Progress Notes (Signed)
Occupational Therapy Evaluation Patient Details Name: [name] MRN: [id number] DOB: 07/26/1933 Today's Date: 05/29/2011 Time: 8:28-8:53 am EV II, 1 Ther ex; Indirect care 2 Problem List:  Patient Active Problem List  Diagnoses  . C. difficile colitis  . Acute renal failure  . Gastritis  . Encephalopathy acute  . Acute respiratory failure  . Anemia  . Alcohol induced fatty liver  . Hypothyroid  . Hypokalemia  . Septic shock  . Thrombocytopenia  . DM (diabetes mellitus)  . Hypertriglyceridemia  . Upper GI bleed    Past Medical History:  Past Medical History  Diagnosis Date  . Abdominal pain   . Hypothyroid   . Diabetes mellitus   . COPD (chronic obstructive pulmonary disease)   . GERD (gastroesophageal reflux disease)    Past Surgical History:  Past Surgical History  Procedure Date  . Abdominal hysterectomy   . Esophagogastroduodenoscopy 05/17/2011    Procedure: ESOPHAGOGASTRODUODENOSCOPY (EGD);  Surgeon: Hart Carwin, MD;  Location: [facility];  Service: Endoscopy;  Laterality: N/A;    OT Assessment/Plan/Recommendation OT Assessment Clinical Impression Statement: Pt presents with significant defecits in ADL's and self care in part to overall deconditioning, decreased cognition and safety awareness, she will benefit from acute OT followed by SNF Rehab. Pt w/ posterior lean in sitting EOB w/ Max +2 assist & HOB elevated. Difficulty following commands, no family present during eval. OT Recommendation/Assessment: Patient will need skilled OT in the acute care venue, followed by SNF Rehab OT Problem List: Decreased strength;Decreased activity tolerance;Decreased safety awareness;Decreased cognition;Impaired balance (sitting and/or standing);Decreased knowledge of use of DME or AE OT Therapy Diagnosis : Generalized weakness;Cognitive deficits OT Plan OT Frequency: Min 1X/week OT Treatment/Interventions: Self-care/ADL training;Energy conservation;Therapeutic  activities;Patient/family education;Balance training;Cognitive remediation/compensation OT Recommendation Follow Up Recommendations: Skilled nursing facility Equipment Recommended: Defer to next venue Individuals Consulted Consulted and Agree with Results and Recommendations: Patient unable/family or caregiver not available OT Goals Acute Rehab OT Goals OT Goal Formulation: Patient unable to participate in goal setting Time For Goal Achievement: 7 days ADL Goals Pt Will Perform Grooming: with set-up;Sitting, edge of bed;Supported;with cueing (comment type and amount);with mod assist ADL Goal: Grooming - Progress: Progressing toward goals Pt Will Perform Upper Body Bathing: with max assist;with mod assist;Sitting, edge of bed;Supported;with cueing (comment type and amount) ADL Goal: Upper Body Bathing - Progress: Progressing toward goals Additional ADL Goal #1: Pt will sit @ EOB in preparation for increased participation in ADL/self care tasks, for 10 minutes w/ Min assist unsupported. ADL Goal: Additional Goal #1 - Progress: Progressing toward goals Arm Goals Pt Will Perform AROM: with minimal assist;Bilateral upper extremities;10 reps;2 sets;Other (comment);with supervision, verbal cues required/provided (To assist with increased activity tolerance/strengthening) Arm Goal: AROM - Progress: Progressing toward goal  OT Evaluation Precautions/Restrictions  Precautions Precautions: Fall;Other (comment) (Contact Precautions) Precaution Comments: Still has lines/tubes but is extubated now and not on CVVHD. Required Braces or Orthoses: No Restrictions Weight Bearing Restrictions: No Prior Functioning Home Living Additional Comments: TBA, No family present   ADL ADL Grooming: Performed;Wash/dry face;Wash/dry hands;Minimal assistance;Supervision/safety Grooming Details (indicate cue type and reason): VC's, TC's for safety and sequencing, pt w/ difficulty following commands noted, but does  attempt. Benefits from hand over hand Min A Where Assessed - Grooming: Supine, head of bed up Upper Body Bathing: Simulated;Moderate assistance Where Assessed - Upper Body Bathing: Supine, head of bed up;Supported Lower Body Bathing: Simulated;+1 Total assistance Where Assessed - Lower Body Bathing: Supine, head of bed up Upper Body  Dressing: Simulated;Moderate assistance;Maximal assistance Where Assessed - Upper Body Dressing: Supine, head of bed up;Sitting, bed;Supported Toilet Transfer: Not assessed;Other (comment) (Pt w/ catheters & req consistent VC's ) Toileting - Hygiene: Simulated;Maximal assistance;+1 Total assistance Toileting - Hygiene Details (indicate cue type and reason): Difficulty following commands and sequencing, safety Where Assessed - Toileting Hygiene: Rolling right and/or left;Supine, head of bed flat ADL Comments: Pt presents with significant defecits in ADL's and self care in part to overall deconditioning, decreased cognition and safety awareness, she will benefit from acute OT followed by SNF Rehab. Pt w/ posterior lean in sitting EOB w/ Max +2 assist Vision/Perception  Vision - History Baseline Vision: Other (comment) (Pt reports wears glasses) Cognition Cognition Arousal/Alertness: Lethargic Overall Cognitive Status: No family/caregiver present to determine baseline cognitive functioning (Impaired) Orientation Level: Oriented to person;Disoriented to place;Disoriented to situation;Disoriented to time Following Commands: Follows one step commands inconsistently;Other (comment) (Requires VC's for safety and sequencing of tasks) Safety/Judgement: Decreased safety judgement for tasks assessed Decreased Safety/Judgement: Decreased awareness of need for assistance Problem Solving: Requires assistance for problem solving Sensation/Coordination Sensation Light Touch: Appears Intact Coordination Gross Motor Movements are Fluid and Coordinated: Yes Fine Motor Movements  are Fluid and Coordinated:  (Grossly intact FM, not fluid) Extremity Assessment RUE Assessment RUE Assessment: Within Functional Limits (Grossly WFL's w/ increased time) RUE Strength RUE Overall Strength: Other (Comment) (Overall deconditioning, grossly 2+-3-/5) LUE Assessment LUE Assessment:  (Grossly WFL's except end range shoulder flexion) LUE Strength LUE Overall Strength: Other (Comment) (Grossly 2+/5 throughout) Mobility  Bed Mobility Bed Mobility: Yes Rolling Right: 1: +1 Total assist Rolling Right Details (indicate cue type and reason): VC/TC for hand placement and step by step instruction Supine to Sit: HOB elevated (Comment degrees);1: +1 Total assist Supine to Sit Details (indicate cue type and reason): HOB elevated to approx 70 degrees, consistent VC's and tactile cues, pt with posterior lean and w/ decreased safety awareness and awareness of deficits Scooting to HOB: 1: +2 Total assist Scooting to [facility] Details (indicate cue type and reason): Pt approx 10%, difficulty following commands and decreased use of LE/UE's to assist even after VC/TC and hand placement Transfers Transfers: No Exercises General Exercises - Upper Extremity Shoulder Flexion: Both;10 reps;Supine;AAROM Shoulder Extension: AAROM;Both;10 reps;Supine;Other (comment) (HOB elevated for all ther ex) Elbow Flexion: Both;10 reps;Supine;AROM Elbow Extension: Both;10 reps;Supine;AROM Digit Composite Flexion: AROM;Both;5 reps;Supine Composite Extension: AROM;Both;5 reps;Supine End of Session OT - End of Session Equipment Utilized During Treatment: Gait belt;Other (comment) (Bedding/pad under pt to assist w/ repositioning) Activity Tolerance: Patient limited by fatigue Patient left: in bed;with call bell in reach;with bed alarm set General Behavior During Session: [facility] for tasks performed Cognition: Impaired   Roselie Awkward Dixon 05/29/2011, 10:24 AM

## 2011-05-29 NOTE — Progress Notes (Signed)
Clinical Social Worker contacted pt daughter to discuss discharge planning. Pt currently does not have any bed offers in [facility], but this Clinical Social Worker has spoken with [facility] in [place] and sent further information and will follow up with facility on Monday as that facility is pt daughter's first choice. Clinical Social Worker encouraged pt daughter to review the list of facilities in [facility] that this Visual merchandiser provided pt daughter to look into other options that pt daughter may be interested in. Clinical Social Worker to follow up with pt daughter in regard to other facilities she may be interested in and continue to send facilities information and follow up in regard to bed offers. Clinical Social Worker to facilitate pt D/C needs when pt medically ready for D/C.  Jacklynn Lewis, MSW, LCSWA  Clinical Social Work [phone]

## 2011-05-30 LAB — GLUCOSE, CAPILLARY
Glucose-Capillary: 144 mg/dL — ABNORMAL HIGH (ref 70–99)
Glucose-Capillary: 116 mg/dL — ABNORMAL HIGH (ref 70–99)

## 2011-05-30 LAB — DIFFERENTIAL
Lymphocytes Relative: 12 % (ref 12–46)
Lymphs Abs: 1.7 10*3/uL (ref 0.7–4.0)
Monocytes Absolute: 1.5 10*3/uL — ABNORMAL HIGH (ref 0.1–1.0)
Monocytes Relative: 11 % (ref 3–12)
Neutro Abs: 10.3 10*3/uL — ABNORMAL HIGH (ref 1.7–7.7)
Neutrophils Relative %: 76 % (ref 43–77)

## 2011-05-30 LAB — RENAL FUNCTION PANEL
BUN: 49 mg/dL — ABNORMAL HIGH (ref 6–23)
CO2: 18 mEq/L — ABNORMAL LOW (ref 19–32)
Calcium: 8.6 mg/dL (ref 8.4–10.5)
Creatinine, Ser: 3.91 mg/dL — ABNORMAL HIGH (ref 0.50–1.10)
GFR calc non Af Amer: 10 mL/min — ABNORMAL LOW (ref >90)
Glucose, Bld: 79 mg/dL (ref 70–99)

## 2011-05-30 LAB — CBC
HCT: 25.1 % — ABNORMAL LOW (ref 36.0–46.0)
Hemoglobin: 8.8 g/dL — ABNORMAL LOW (ref 12.0–15.0)
RBC: 2.72 MIL/uL — ABNORMAL LOW (ref 3.87–5.11)
WBC: 13.5 10*3/uL — ABNORMAL HIGH (ref 4.0–10.5)

## 2011-05-30 MED ORDER — SODIUM CHLORIDE 0.9 % IJ SOLN
10.0000 mL | INTRAMUSCULAR | Status: DC | PRN
  Administered 2011-05-30 – 2011-06-03 (×21): 10 mL

## 2011-05-30 NOTE — Progress Notes (Signed)
Subjective: Interval History: none.  Objective: Vital signs in last 24 hours:  Temp:  [97.7 F (36.5 C)-98.6 F (37 C)] 98.6 F (37 C) (12/08 0500) Pulse Rate:  [80-90] 82  (12/08 0500) Resp:  [18] 18  (12/08 0500) BP: (130-175)/(88-95) 157/93 mmHg (12/08 0500) SpO2:  [94 %-96 %] 94 % (12/08 0500)  Weight change:   Intake/Output: I/O last 3 completed shifts: In: 240 [P.O.:240] Out: 1000 [Urine:1000]   Intake/Output this shift:     General: Well nourished/developed appearance female  Head: NCAT  Eyes: Eyes opening and tracking to stimulation, will follow simple commands  Ears: Intact, no discharge  Nose: No discharge  Throat: Midline trachea, no JVD, no LAD  Pulmonary: no rales audible no distress  CV: RRR, no murmurs/rubs/gallops  ABD: (+)BS but very deminish, distended, nontender, no guarding, no rebound  EXT: No edema   Lab Results:  Basename 05/30/11 0435 05/29/11 0535 05/28/11 0610  WBC 13.5* 13.4* 13.1*  HGB 8.8* 8.9* 9.0*  HCT 25.1* 25.8* 26.1*  PLT 148* 150 143*   BMET  Basename 05/30/11 0435 05/29/11 0535 05/28/11 0610  NA 139 139 137  K 3.8 4.3 3.4*  CL 107 108 105  CO2 18* 19 21  GLUCOSE 79 79 69*  BUN 49* 51* 47*  CREATININE 3.91* 4.69* 4.72*  CALCIUM 8.6 8.6 8.6  PHOS 6.7* 6.6* 6.6*   LFT  Basename 05/30/11 0435 05/28/11 0610  PROT -- 5.5*  ALBUMIN 2.3* --  AST -- 69*  ALT -- 26  ALKPHOS -- 394*  BILITOT -- 2.2*  BILIDIR -- 1.5*  IBILI -- 0.7   PT/INR No results found for this basename: LABPROT:2,INR:2 in the last 72 hours Hepatitis Panel No results found for this basename: HEPBSAG,HCVAB,HEPAIGM,HEPBIGM in the last 72 hours  Studies/Results: No results found.  I have reviewed the patient's current medications.  Assessment/Plan: 1. ARF - Creatinine improving 2. VDRF- extubated  3. Metabolic acidosis- improved  4. Colitis- on flagyl, cipro, vanco. C diff +  5. SIRS- off pressors and on broad spectrum abx  6. ABLA- s/p  transfusion hb pending this AM  7. Etoh/fatty liver  8. UGI bleed- Hb pending  Patient continues to make urine . She displays no evidence of volume overload. Her creatinine is falling this AM       LOS: 14 Kaly Mcquary W @TODAY @10 :05 AM

## 2011-05-30 NOTE — Progress Notes (Signed)
Lab came to draw AM labs. Patient refused stating "not this early- come back". Asked lab to retry at a later time this morning.

## 2011-05-30 NOTE — Progress Notes (Signed)
Subjective: Patient seen and examined this am. Appears better. No overnight issues. Renal output improved to 800 cc  Objective:  Vital signs in last 24 hours:  Filed Vitals:   05/29/11 0700 05/29/11 1500 05/29/11 2200 05/30/11 0500  BP: 136/87 130/88 175/95 157/93  Pulse: 88 80 90 82  Temp: 97.1 F (36.2 C) 97.7 F (36.5 C) 98.3 F (36.8 C) 98.6 F (37 C)  TempSrc: Oral Oral    Resp: 16 18 18 18   Height:      Weight: 54.1 kg (119 lb 4.3 oz)     SpO2: 96% 95% 96% 94%    Intake/Output from previous day:   Intake/Output Summary (Last 24 hours) at 05/30/11 1401 Last data filed at 05/30/11 0600  Gross per 24 hour  Intake      0 ml  Output    800 ml  Net   -800 ml    Physical Exam:   General: elderly thin built female in no acute distress.  HEENT: no pallor, no icterus, moist oral mucosa, no JVD, no lymphadenopathy. IJ in place  Heart: Normal s1 &s2 Regular rate and rhythm, without murmurs, rubs, gallops.  Lungs: diminished breath sounds at lung bases  Abdomen: Soft, distended , non tender to palpation  , positive bowel sounds. Foley  in place  Extremities: No clubbing cyanosis or edema with positive pedal pulses.  Neuro: Alert, awake, and oriented x 3nonfocal.  Lab Results:  Basic Metabolic Panel:    Component Value Date/Time   NA 139 05/30/2011 0435   K 3.8 05/30/2011 0435   CL 107 05/30/2011 0435   CO2 18* 05/30/2011 0435   BUN 49* 05/30/2011 0435   CREATININE 3.91* 05/30/2011 0435   GLUCOSE 79 05/30/2011 0435   CALCIUM 8.6 05/30/2011 0435   CBC:    Component Value Date/Time   WBC 13.5* 05/30/2011 0435   HGB 8.8* 05/30/2011 0435   HCT 25.1* 05/30/2011 0435   PLT 148* 05/30/2011 0435   MCV 92.3 05/30/2011 0435   NEUTROABS 10.3* 05/30/2011 0435   LYMPHSABS 1.7 05/30/2011 0435   MONOABS 1.5* 05/30/2011 0435   EOSABS 0.0 05/30/2011 0435   BASOSABS 0.1 05/30/2011 0435    Recent Results (from the past 240 hour(s))  STOOL CULTURE     Status: Normal   Collection Time   05/20/11  7:45 PM      Component Value Range Status Comment   Specimen Description STOOL   Final    Special Requests NONE   Final    Culture     Final    Value: NO SALMONELLA, SHIGELLA, CAMPYLOBACTER, OR YERSINIA ISOLATED     Note: REDUCED NORMAL FLORA PRESENT   Report Status 05/24/2011 FINAL   Final   CLOSTRIDIUM DIFFICILE BY PCR     Status: Abnormal   Collection Time   05/20/11  7:45 PM      Component Value Range Status Comment   C difficile by pcr POSITIVE (*) NEGATIVE  Final     Studies/Results: No results found.  Medications: Scheduled Meds:   . antiseptic oral rinse  15 mL Mouth Rinse QID  . feeding supplement  237 mL Oral BID BM  . folic acid  1 mg Oral Daily  . insulin aspart  2-15 Units Subcutaneous TID WC & HS  . insulin glargine  10 Units Subcutaneous QHS  . levothyroxine  75 mcg Oral QAC breakfast  . LORazepam  0.5 mg Oral BID  . metroNIDAZOLE  500 mg Oral  Q8H  . multivitamins ther. w/minerals  1 tablet Oral Daily  . pantoprazole  40 mg Oral Q1200  . thiamine  100 mg Oral Daily  . vancomycin  500 mg Oral Q6H   Continuous Infusions:   . sodium chloride 20 mL/hr at 05/29/11 1132   PRN Meds:.acetaminophen (TYLENOL) oral liquid 160 mg/5 mL, albuterol, haloperidol lactate, ipratropium  Assessment  77 y.o. female who is a heavy drinkerwas found down at home on 11/25 with altered MS, hypotension, hypoglycemic ( fsg in 20s) and covered in coffee ground emesis. Admitted to ICU on 11/25 for resp failure and septic shock intubate and extubated on 11/30. Hospital course complicated with cdiff pancolitis and oliguric renal failure.   Plan:   cdiff Pancolitis  pancolitis from CT 11/25  On po vanco. ( since 11/29) Pt was also on IV flagyl ( started on 11/29 after c diff pos , order expired briefly on 12/5) restarted flagyl to complete at least 14 day course for now ( until 12/12)  Off vanco enema  Repeat abd CT done on 12/5 with improvment in colitis . Shows worsening  atelectasis/ b/l effusion) and gallstones and ascites  leucocytosis improving  Diarrhea improving . informs having formed stool today. Rectal tube dced on 12/7  Upper GI bleed with anemia  currently stable  S/p PRBC and initially treated with PPI drip and octeotide  Cont PPI  Possible sec to etoh gastritis  Will need outpt GI follow up  Seen by Dr Dickie La on 11/25. EGD done on 11/25 showing severe chr gastritis   Oliguric ATN  Patient had CVVHD in ICU. Last done on 12/03  being follwed by renal  creatinine further worsened to 4.7 on 12/6 , has been steady at 4.7today. She continues to make urine . Again had 550cc urine output past 24 hrs  -renal recommend holding on HDas she doesnot clinically appear volume overloaded and now her creatinine slowly improving  2D echo done this admission with normal LV fn  Repeat CT abd and pelvis on 12/05 showing b/l pleural effusion that has increased along with ascites. This could explained with fluid retention. Also she does has hypoalbuminemia. No pedal edema on exam.  Monitor urine outpt closely . Improved to 800 cc . Monitor renla fn daily  Acute respiratory failure-  Resolved. Possibly related to aspiration extubated 11/30.  Prn nebs  B/l mild Effusions on xray and atalectasis ,. Repeat CT abd on 12/5 showing increased atalectasis and effusion .  Incentive spirometry, ambulate.  tolerating PT now   Confusion with encephalopathy  improving slowly as per daughter . She appears progressively  oriented for past 3 days likely in the setting of septic shock. Will closely monitor  . Cont haldol prn for agitation.   Alcohol induced fatty liver and  Hyperbilirubinemia-and transaminitis  ? Shock liver . She also has findings of gallstones on CT abd but no RUQ tenderness on exam. repeat LFTs on 12/6 slowly improving   Thrombocytopenia  - likely due to septic shock and etoh abuse now improving.   Hypokalemia  Resolved   DM II-  A1c 6.6. fsg  stable on Lantus and Novolog   Hypertriglyceridemia-  615 earlier this admission on 11/26.   DVT prophylaxis SCD boots   Daughter involved in care . Plan updated on 12/5 and 12/6 . Will update today Dispo: to SNF once stable clinically and renal function improved         LOS: 14 days   Nyeemah Jennette,  Mckenze Slone 05/30/2011, 2:01 PM

## 2011-05-31 LAB — DIFFERENTIAL
Basophils Absolute: 0.1 10*3/uL (ref 0.0–0.1)
Eosinophils Relative: 0 % (ref 0–5)
Lymphs Abs: 2.3 10*3/uL (ref 0.7–4.0)
Monocytes Absolute: 1.2 10*3/uL — ABNORMAL HIGH (ref 0.1–1.0)
Monocytes Relative: 11 % (ref 3–12)
Neutrophils Relative %: 68 % (ref 43–77)

## 2011-05-31 LAB — RENAL FUNCTION PANEL
CO2: 20 mEq/L (ref 19–32)
Calcium: 8.4 mg/dL (ref 8.4–10.5)
GFR calc Af Amer: 17 mL/min — ABNORMAL LOW (ref >90)
GFR calc non Af Amer: 15 mL/min — ABNORMAL LOW (ref >90)
Glucose, Bld: 92 mg/dL (ref 70–99)
Potassium: 3.5 mEq/L (ref 3.5–5.1)
Sodium: 144 mEq/L (ref 135–145)

## 2011-05-31 LAB — GLUCOSE, CAPILLARY: Glucose-Capillary: 94 mg/dL (ref 70–99)

## 2011-05-31 LAB — CBC
MCH: 32.3 pg (ref 26.0–34.0)
MCV: 94 fL (ref 78.0–100.0)
Platelets: 147 10*3/uL — ABNORMAL LOW (ref 150–400)
RBC: 2.48 MIL/uL — ABNORMAL LOW (ref 3.87–5.11)
RDW: 19.3 % — ABNORMAL HIGH (ref 11.5–15.5)
WBC: 11.3 10*3/uL — ABNORMAL HIGH (ref 4.0–10.5)

## 2011-05-31 MED ORDER — BIOTENE DRY MOUTH MT LIQD
15.0000 mL | Freq: Two times a day (BID) | OROMUCOSAL | Status: DC
  Administered 2011-06-01 – 2011-06-04 (×6): 15 mL via OROMUCOSAL

## 2011-05-31 NOTE — Progress Notes (Signed)
Per report pt's flexiseal came out earlier today.  Told patient I would be in to reinsert flexiseal. Pt. Stated "not tonight." Pt. Refused reinsertion.

## 2011-05-31 NOTE — Progress Notes (Signed)
Subjective: Patient seen and examined this am. No overnight issues.  Objective:  Vital signs in last 24 hours:  Filed Vitals:   05/30/11 0500 05/30/11 1500 05/30/11 2200 05/31/11 0500  BP: 157/93 137/81 139/89 149/81  Pulse: 82 86 84 86  Temp: 98.6 F (37 C) 97.4 F (36.3 C) 98 F (36.7 C) 97.5 F (36.4 C)  TempSrc:  Oral    Resp: 18 20 20 20   Height:      Weight:      SpO2: 94% 96% 91% 96%    Intake/Output from previous day:   Intake/Output Summary (Last 24 hours) at 05/31/11 1622 Last data filed at 05/31/11 0900  Gross per 24 hour  Intake    480 ml  Output    500 ml  Net    -20 ml    Physical Exam:  General: elderly thin built female in no acute distress.  HEENT: no pallor, no icterus, moist oral mucosa, no JVD, no lymphadenopathy. IJ in place  Heart: Normal s1 &s2 Regular rate and rhythm, without murmurs, rubs, gallops.  Lungs: diminished breath sounds at lung bases  Abdomen: Soft, distended , non tender to palpation , positive bowel sounds. Foley in place  Extremities: No clubbing cyanosis or edema with positive pedal pulses.  Neuro: Alert, awake, and oriented x 3 nonfocal.   Lab Results:  Basic Metabolic Panel:    Component Value Date/Time   NA 144 05/31/2011 0500   K 3.5 05/31/2011 0500   CL 112 05/31/2011 0500   CO2 20 05/31/2011 0500   BUN 43* 05/31/2011 0500   CREATININE 2.87* 05/31/2011 0500   GLUCOSE 92 05/31/2011 0500   CALCIUM 8.4 05/31/2011 0500   CBC:    Component Value Date/Time   WBC 11.3* 05/31/2011 0500   HGB 8.0* 05/31/2011 0500   HCT 23.3* 05/31/2011 0500   PLT 147* 05/31/2011 0500   MCV 94.0 05/31/2011 0500   NEUTROABS 7.7 05/31/2011 0500   LYMPHSABS 2.3 05/31/2011 0500   MONOABS 1.2* 05/31/2011 0500   EOSABS 0.0 05/31/2011 0500   BASOSABS 0.1 05/31/2011 0500    No results found for this or any previous visit (from the past 240 hour(s)).  Studies/Results: No results found.  Medications: Scheduled Meds:   . antiseptic oral rinse  15  mL Mouth Rinse BID  . feeding supplement  237 mL Oral BID BM  . folic acid  1 mg Oral Daily  . insulin aspart  2-15 Units Subcutaneous TID WC & HS  . insulin glargine  10 Units Subcutaneous QHS  . levothyroxine  75 mcg Oral QAC breakfast  . LORazepam  0.5 mg Oral BID  . metroNIDAZOLE  500 mg Oral Q8H  . multivitamins ther. w/minerals  1 tablet Oral Daily  . pantoprazole  40 mg Oral Q1200  . thiamine  100 mg Oral Daily  . vancomycin  500 mg Oral Q6H  . DISCONTD: antiseptic oral rinse  15 mL Mouth Rinse QID   Continuous Infusions:   . sodium chloride 20 mL/hr at 05/31/11 0840   PRN Meds:.acetaminophen (TYLENOL) oral liquid 160 mg/5 mL, albuterol, haloperidol lactate, ipratropium, sodium chloride   Assessment  77 y.o. female who is a heavy drinkerwas found down at home on 11/25 with altered MS, hypotension, hypoglycemic ( fsg in 20s) and covered in coffee ground emesis. Admitted to ICU on 11/25 for resp failure and septic shock intubate and extubated on 11/30. Hospital course complicated with cdiff pancolitis and oliguric renal failure.  Plan:  cdiff Pancolitis  pancolitis from CT 11/25  On po vanco. ( since 11/29) Pt was also on IV flagyl ( started on 11/29 after c diff pos , order expired briefly on 12/5) restarted flagyl to complete at least 14 day course for now ( until 12/12)  Off vanco enema  Repeat abd CT done on 12/5 with improvment in colitis . Shows worsening atelectasis/ b/l effusion) and gallstones and ascites  leucocytosis improving  Diarrhea improving . informs having formed stool today. Rectal tube dced on 12/7   Upper GI bleed with anemia  currently stable  S/p PRBC and initially treated with PPI drip and octeotide  Cont PPI  Possible sec to etoh gastritis  Will need outpt GI follow up  Seen by Dr Dickie La on 11/25. EGD done on 11/25 showing severe chr gastritis   Oliguric ATN  Patient had CVVHD in ICU. Last done on 12/03  being follwed by renal  creatinine  further worsened to 4.7 on 12/6 , has been improving now ( 2.8 today). She continues to make urine . Documented to be 550 cc but appears to have made more -renal recommend holding on HDas she doesnot clinically appear volume overloaded and now her creatinine slowly improving  2D echo done this admission with normal LV fn  Repeat CT abd and pelvis on 12/05 showing b/l pleural effusion that has increased along with ascites. This could explained with fluid retention. Also she does has hypoalbuminemia. No pedal edema on exam.  Monitor urine outpt closely .  Monitor creatinine fn daily   Acute respiratory failure-  Resolved. Possibly related to aspiration extubated 11/30.  Prn nebs  B/l mild Effusions on xray and atalectasis ,. Repeat CT abd on 12/5 showing increased atalectasis and effusion .  Incentive spirometry, ambulate.  tolerating PT now   Confusion with encephalopathy  improving slowly as per daughter . She appears progressively oriented now likely in the setting of septic shock. . Cont haldol prn for agitation.   Alcohol induced fatty liver and  Hyperbilirubinemia-and transaminitis  ? Shock liver . She also has findings of gallstones on CT abd but no RUQ tenderness on exam. repeat LFTs on 12/6 slowly improving  Thrombocytopenia  - likely due to septic shock and etoh abuse now improving.   Hypokalemia  Resolved   DM II-  A1c 6.6. fsg stable on Lantus and Novolog   Hypertriglyceridemia-  615 earlier this admission on 11/26.   DVT prophylaxis SCD boots   Daughter involved in care . Plan updated  Dispo: to SNF once stable clinically and renal function improved         LOS: 15 days   Jaysion Ramseyer 05/31/2011, 4:22 PM

## 2011-05-31 NOTE — Progress Notes (Signed)
CSW spoke with Pt dtr on phone.  Additional facilities requested to be contacted for rehab.  Milus Banister MSW,LCSW w/e Coverage [phone]

## 2011-05-31 NOTE — Progress Notes (Signed)
Subjective: Interval History: none.  Objective: Vital signs in last 24 hours:  Temp:  [97.4 F (36.3 C)-98 F (36.7 C)] 97.5 F (36.4 C) (12/09 0500) Pulse Rate:  [84-86] 86  (12/09 0500) Resp:  [20] 20  (12/09 0500) BP: (137-149)/(81-89) 149/81 mmHg (12/09 0500) SpO2:  [91 %-96 %] 96 % (12/09 0500)  Weight change:   Intake/Output: I/O last 3 completed shifts: In: 360 [P.O.:120; I.V.:240] Out: 1000 [Urine:1000]   Intake/Output this shift:     General: Well nourished/developed appearance female  Head: NCAT  Eyes: Eyes opening and tracking to stimulation, will follow simple commands  Ears: Intact, no discharge  Nose: No discharge  Throat: Midline trachea, no JVD, no LAD  Pulmonary: no rales audible no distress  CV: RRR, no murmurs/rubs/gallops  ABD: (+)BS but very diminish, distended, nontender, no guarding, no rebound  EXT: No edema   Lab Results:  Basename 05/31/11 0500 05/30/11 0435 05/29/11 0535  WBC 11.3* 13.5* 13.4*  HGB 8.0* 8.8* 8.9*  HCT 23.3* 25.1* 25.8*  PLT 147* 148* 150   BMET  Basename 05/31/11 0500 05/30/11 0435 05/29/11 0535  NA 144 139 139  K 3.5 3.8 4.3  CL 112 107 108  CO2 20 18* 19  GLUCOSE 92 79 79  BUN 43* 49* 51*  CREATININE 2.87* 3.91* 4.69*  CALCIUM 8.4 8.6 8.6  PHOS 5.4* 6.7* 6.6*   LFT  Basename 05/31/11 0500  PROT --  ALBUMIN 2.4*  AST --  ALT --  ALKPHOS --  BILITOT --  BILIDIR --  IBILI --   PT/INR No results found for this basename: LABPROT:2,INR:2 in the last 72 hours Hepatitis Panel No results found for this basename: HEPBSAG,HCVAB,HEPAIGM,HEPBIGM in the last 72 hours  Studies/Results: No results found.  I have reviewed the patient's current medications.  Assessment/Plan: 1. ARF - Creatinine improving  2. VDRF- extubated  3. Metabolic acidosis- improved  4. Colitis- on flagyl, cipro, vanco. C diff +  5. SIRS- off pressors and on broad spectrum abx  6. ABLA- s/p transfusion hb pending this AM  7.  Etoh/fatty liver  8. UGI bleed- Hb pending  Patient continues to make urine . She displays no evidence of volume overload. Her creatinine is falling now and anticipate a return to baseline.  The temporary dialysis catheter can be removed       LOS: 15 Yoni Lobos W @TODAY @8 :53 AM

## 2011-06-01 LAB — GLUCOSE, CAPILLARY: Glucose-Capillary: 106 mg/dL — ABNORMAL HIGH (ref 70–99)

## 2011-06-01 LAB — CBC
MCH: 32.5 pg (ref 26.0–34.0)
MCV: 95.6 fL (ref 78.0–100.0)
Platelets: 158 10*3/uL (ref 150–400)
RDW: 19.4 % — ABNORMAL HIGH (ref 11.5–15.5)
WBC: 12.5 10*3/uL — ABNORMAL HIGH (ref 4.0–10.5)

## 2011-06-01 LAB — RENAL FUNCTION PANEL
BUN: 31 mg/dL — ABNORMAL HIGH (ref 6–23)
CO2: 21 mEq/L (ref 19–32)
Calcium: 8.3 mg/dL — ABNORMAL LOW (ref 8.4–10.5)
Chloride: 114 mEq/L — ABNORMAL HIGH (ref 96–112)
Creatinine, Ser: 2.02 mg/dL — ABNORMAL HIGH (ref 0.50–1.10)

## 2011-06-01 MED ORDER — AMLODIPINE BESYLATE 10 MG PO TABS
10.0000 mg | ORAL_TABLET | Freq: Every day | ORAL | Status: DC
  Administered 2011-06-02 – 2011-06-04 (×3): 10 mg via ORAL
  Filled 2011-06-01 (×3): qty 1

## 2011-06-01 MED ORDER — HYDRALAZINE HCL 10 MG PO TABS
10.0000 mg | ORAL_TABLET | Freq: Four times a day (QID) | ORAL | Status: DC | PRN
  Filled 2011-06-01: qty 1

## 2011-06-01 MED ORDER — POTASSIUM CHLORIDE 20 MEQ PO PACK
40.0000 meq | PACK | Freq: Once | ORAL | Status: DC
  Filled 2011-06-01: qty 2

## 2011-06-01 MED ORDER — AMLODIPINE BESYLATE 5 MG PO TABS
5.0000 mg | ORAL_TABLET | Freq: Every day | ORAL | Status: DC
  Administered 2011-06-01: 5 mg via ORAL
  Filled 2011-06-01: qty 1

## 2011-06-01 MED ORDER — POTASSIUM CHLORIDE CRYS ER 20 MEQ PO TBCR
EXTENDED_RELEASE_TABLET | ORAL | Status: AC
  Administered 2011-06-01: 40 meq via ORAL
  Filled 2011-06-01: qty 2

## 2011-06-01 MED ORDER — POTASSIUM CHLORIDE CRYS ER 20 MEQ PO TBCR
40.0000 meq | EXTENDED_RELEASE_TABLET | Freq: Once | ORAL | Status: AC
  Administered 2011-06-01 – 2011-06-02 (×2): 40 meq via ORAL

## 2011-06-01 NOTE — Progress Notes (Signed)
Clinical Social Worker contacted pt daughter and had lengthy discussion about pt D/C plans. Discussed with pt daughter that [facility] in [place], [place] is unable to offer a bed. Discussed with pt daughter that referrals have been made to [facility] in [place] and [facility].  Discussed with pt daughter about expanding the SNF search to [facility]. Pt daughter agreeable to SNF search in [facility], but first choice is SNF placement in [facility] and pt daughter is interested in discussing with MD about pt returning home with pt daughter if unable to secure bed in [facility]. Clinical Social Worker informed MD. Clinical Social Worker initiated SNF search in [facility] as an option for pt at D/C.--Clinical Social Worker received phone call from pt daughter stating that she had a discussion with [facility] and stated that facility is willing to review pt recent PT treatment note to determine if they are able to offer a bed. Discussed with pt daughter that last PT treatment note was completed 12/7 and pt daughter would like PT to see pt again before treatment note is sent to facility. Clinical Social Worker contacted acute rehabilitation and they stated that pt was not on the list to be seen today, but will be seen tomorrow. Clinical Social Worker to send updated information to [facility] once PT treatment note is available. Clinical Social Worker to follow up with pt daughter tomorrow to further discuss d/c plans.  Jacklynn Lewis, MSW, LCSWA  Clinical Social Work [phone]

## 2011-06-01 NOTE — Progress Notes (Signed)
S:diarrhea improving.  Eating well O:BP 167/87  Pulse 75  Temp(Src) 98 F (36.7 C) (Oral)  Resp 20  Ht 4\' 9"  (1.448 m)  Wt 54.1 kg (119 lb 4.3 oz)  BMI 25.81 kg/m2  SpO2 97%  LMP 05/17/1983  Intake/Output Summary (Last 24 hours) at 06/01/11 1249 Last data filed at 06/01/11 0554  Gross per 24 hour  Intake      0 ml  Output    500 ml  Net   -500 ml   Weight change:  ZOX:WRUEA and alert CVS:RRR Resp:clear but decreased in bases Abd:+bs NTND Ext:no edema NEURO:Ox3      . amLODipine  5 mg Oral Daily  . antiseptic oral rinse  15 mL Mouth Rinse BID  . feeding supplement  237 mL Oral BID BM  . folic acid  1 mg Oral Daily  . insulin aspart  2-15 Units Subcutaneous TID WC & HS  . insulin glargine  10 Units Subcutaneous QHS  . levothyroxine  75 mcg Oral QAC breakfast  . LORazepam  0.5 mg Oral BID  . metroNIDAZOLE  500 mg Oral Q8H  . multivitamins ther. w/minerals  1 tablet Oral Daily  . pantoprazole  40 mg Oral Q1200  . potassium chloride  40 mEq Oral Once  . thiamine  100 mg Oral Daily  . vancomycin  500 mg Oral Q6H  . DISCONTD: potassium chloride  40 mEq Oral Once   No results found. BMET    Component Value Date/Time   NA 145 06/01/2011 0500   K 3.1* 06/01/2011 0500   CL 114* 06/01/2011 0500   CO2 21 06/01/2011 0500   GLUCOSE 89 06/01/2011 0500   BUN 31* 06/01/2011 0500   CREATININE 2.02* 06/01/2011 0500   CALCIUM 8.3* 06/01/2011 0500   GFRNONAA 23* 06/01/2011 0500   GFRAA 27* 06/01/2011 0500   CBC    Component Value Date/Time   WBC 12.5* 06/01/2011 0500   RBC 2.49* 06/01/2011 0500   HGB 8.1* 06/01/2011 0500   HCT 23.8* 06/01/2011 0500   PLT 158 06/01/2011 0500   MCV 95.6 06/01/2011 0500   MCH 32.5 06/01/2011 0500   MCHC 34.0 06/01/2011 0500   RDW 19.4* 06/01/2011 0500   LYMPHSABS 2.3 05/31/2011 0500   MONOABS 1.2* 05/31/2011 0500   EOSABS 0.0 05/31/2011 0500   BASOSABS 0.1 05/31/2011 0500     Assessment:  1. AFR resolving SCR down to 2.0 2. C  Diff improving   Plan: 1. HD cath can be DC'd if primary team does not need it for IV access 2. Will sign off, call if further renal problems   Kammie Scioli T

## 2011-06-01 NOTE — Progress Notes (Signed)
Subjective:  Continues to feel better. No overnight issues.  Objective:  Vital signs in last 24 hours:  Filed Vitals:   05/31/11 2100 06/01/11 0550 06/01/11 1025 06/01/11 1400  BP: 146/76 178/91 167/87 148/85  Pulse: 90 75  86  Temp: 98.1 F (36.7 C) 98 F (36.7 C)  97.9 F (36.6 C)  TempSrc: Oral Oral  Oral  Resp: 19 20  20   Height:      Weight:      SpO2: 99% 97%  97%    Intake/Output from previous day:   Intake/Output Summary (Last 24 hours) at 06/01/11 1648 Last data filed at 06/01/11 0554  Gross per 24 hour  Intake      0 ml  Output    500 ml  Net   -500 ml    Physical Exam:  General: elderly thin built female in no acute distress.  HEENT: no pallor, no icterus, moist oral mucosa, no JVD, no lymphadenopathy. IJ in place  Heart: Normal s1 &s2 Regular rate and rhythm, without murmurs, rubs, gallops.  Lungs: diminished breath sounds at lung bases  Abdomen: Soft, distended , non tender to palpation , positive bowel sounds. Foley in place  Extremities: No clubbing cyanosis or edema with positive pedal pulses.  Neuro: Alert, awake, and oriented x 3 nonfocal.    Lab Results:  Basic Metabolic Panel:    Component Value Date/Time   NA 145 06/01/2011 0500   K 3.1* 06/01/2011 0500   CL 114* 06/01/2011 0500   CO2 21 06/01/2011 0500   BUN 31* 06/01/2011 0500   CREATININE 2.02* 06/01/2011 0500   GLUCOSE 89 06/01/2011 0500   CALCIUM 8.3* 06/01/2011 0500   CBC:    Component Value Date/Time   WBC 12.5* 06/01/2011 0500   HGB 8.1* 06/01/2011 0500   HCT 23.8* 06/01/2011 0500   PLT 158 06/01/2011 0500   MCV 95.6 06/01/2011 0500   NEUTROABS 7.7 05/31/2011 0500   LYMPHSABS 2.3 05/31/2011 0500   MONOABS 1.2* 05/31/2011 0500   EOSABS 0.0 05/31/2011 0500   BASOSABS 0.1 05/31/2011 0500    No results found for this or any previous visit (from the past 240 hour(s)).  Studies/Results: No results found.  Medications: Scheduled Meds:   . amLODipine  5 mg Oral Daily    . antiseptic oral rinse  15 mL Mouth Rinse BID  . feeding supplement  237 mL Oral BID BM  . folic acid  1 mg Oral Daily  . insulin aspart  2-15 Units Subcutaneous TID WC & HS  . insulin glargine  10 Units Subcutaneous QHS  . levothyroxine  75 mcg Oral QAC breakfast  . LORazepam  0.5 mg Oral BID  . metroNIDAZOLE  500 mg Oral Q8H  . multivitamins ther. w/minerals  1 tablet Oral Daily  . pantoprazole  40 mg Oral Q1200  . potassium chloride  40 mEq Oral Once  . thiamine  100 mg Oral Daily  . vancomycin  500 mg Oral Q6H  . DISCONTD: potassium chloride  40 mEq Oral Once   Continuous Infusions:   . sodium chloride 20 mL/hr at 05/31/11 0840   PRN Meds:.acetaminophen (TYLENOL) oral liquid 160 mg/5 mL, albuterol, haloperidol lactate, ipratropium, sodium chloride  Assessment  77 y.o. female who is a heavy drinkerwas found down at home on 11/25 with altered MS, hypotension, hypoglycemic ( fsg in 20s) and covered in coffee ground emesis. Admitted to ICU on 11/25 for resp failure and septic shock intubate and extubated  on 11/30. Hospital course complicated with cdiff pancolitis and oliguric renal failure.   Plan:  cdiff Pancolitis  pancolitis from CT 11/25  On po vanco. ( since 11/29) Pt was also on IV flagyl ( started on 11/29 after c diff pos , order expired briefly on 12/5) restarted flagyl to complete at least 14 day course for now ( until 12/12)  Off vanco enema  Repeat abd CT done on 12/5 with improvment in colitis . Shows worsening atelectasis/ b/l effusion) and gallstones and ascites  leucocytosis improving  Diarrhea improving . informs having formed stool today. Rectal tube dced on 12/7   Upper GI bleed with anemia  currently stable  S/p PRBC and initially treated with PPI drip and octeotide  Cont PPI  Possible sec to etoh gastritis  Will need outpt GI follow up  Seen by Dr Dickie La on 11/25. EGD done on 11/25 showing severe chr gastritis   Oliguric ATN  Patient had CVVHD in ICU.  Last done on 12/03  being follwed by renal  creatinine further worsened to 4.7 on 12/6 , has been improving now ( 2.today). She continues to make urine . Urine output not properly documented -renal recommend holding on HDas she doesnot clinically appear volume overloaded and now her creatinine slowly improving  2D echo done this admission with normal LV fn  Repeat CT abd and pelvis on 12/05 showing b/l pleural effusion that has increased along with ascites. This could explained with fluid retention. Also she does has hypoalbuminemia. No pedal edema on exam.  Monitor urine outpt closely .  Monitor creatinine fn daily  Renal signed off , will remove temp dialysis catheter  Acute respiratory failure-  Resolved. Possibly related to aspiration extubated 11/30.  Prn nebs  B/l mild Effusions on xray and atalectasis ,. Repeat CT abd on 12/5 showing increased atalectasis and effusion .  Incentive spirometry, ambulate.  tolerating PT now   Confusion with encephalopathy  improving slowly as per daughter . She appears progressively oriented now  likely in the setting of septic shock.  . Cont haldol prn for agitation.   Alcohol induced fatty liver and  Hyperbilirubinemia-and transaminitis  ? Shock liver . She also has findings of gallstones on CT abd but no RUQ tenderness on exam. repeat LFTs on 12/6 slowly improving   Thrombocytopenia  - likely due to septic shock and etoh abuse now improving.   Hypokalemia  Resolved   DM II-  A1c 6.6. fsg stable on Lantus and Novolog   Hypertriglyceridemia-  615 earlier this admission on 11/26.   DVT prophylaxis SCD boots  Daughter involved in care .  Dispo: to SNF . Daughter wants pt to go to [place] area. SW working on bed          LOS: 16 days   [name] 06/01/2011, 4:48 PM

## 2011-06-01 NOTE — Progress Notes (Signed)
Order to DC central line. Notes say to DC HD catheter. Spoke with Dr Betti Cruz for clarification, but he said to have clarified in the am by primary MD.

## 2011-06-02 LAB — RENAL FUNCTION PANEL
CO2: 23 mEq/L (ref 19–32)
Calcium: 7.9 mg/dL — ABNORMAL LOW (ref 8.4–10.5)
Creatinine, Ser: 1.38 mg/dL — ABNORMAL HIGH (ref 0.50–1.10)
Glucose, Bld: 38 mg/dL — CL (ref 70–99)

## 2011-06-02 LAB — GLUCOSE, CAPILLARY
Glucose-Capillary: 61 mg/dL — ABNORMAL LOW (ref 70–99)
Glucose-Capillary: 76 mg/dL (ref 70–99)
Glucose-Capillary: 156 mg/dL — ABNORMAL HIGH (ref 70–99)

## 2011-06-02 MED ORDER — POTASSIUM CHLORIDE CRYS ER 20 MEQ PO TBCR
40.0000 meq | EXTENDED_RELEASE_TABLET | Freq: Once | ORAL | Status: DC
  Filled 2011-06-02 (×2): qty 2

## 2011-06-02 MED ORDER — POTASSIUM CHLORIDE 20 MEQ PO PACK
40.0000 meq | PACK | Freq: Once | ORAL | Status: DC
  Filled 2011-06-02: qty 2

## 2011-06-02 MED ORDER — POTASSIUM CHLORIDE CRYS ER 20 MEQ PO TBCR
40.0000 meq | EXTENDED_RELEASE_TABLET | Freq: Once | ORAL | Status: AC
  Administered 2011-06-02: 40 meq via ORAL

## 2011-06-02 MED ORDER — DEXTROSE 50 % IV SOLN: 25.0000 mL | Freq: Once | INTRAVENOUS | Status: AC | PRN

## 2011-06-02 MED ORDER — DEXTROSE 50 % IV SOLN
INTRAVENOUS | Status: AC
  Administered 2011-06-02: 25 mL
  Filled 2011-06-02: qty 50

## 2011-06-02 MED ORDER — POTASSIUM CHLORIDE CRYS ER 20 MEQ PO TBCR
EXTENDED_RELEASE_TABLET | ORAL | Status: AC
  Administered 2011-06-02: 40 meq via ORAL
  Filled 2011-06-02: qty 2

## 2011-06-02 MED ORDER — POTASSIUM CHLORIDE 10 MEQ/100ML IV SOLN
10.0000 meq | INTRAVENOUS | Status: DC
  Filled 2011-06-02 (×4): qty 100

## 2011-06-02 NOTE — Progress Notes (Signed)
Received critical value of cbg 38 from lab draw at 530, cbg  This morning at 700 was 76, patient was rechecked, patient was alert and oriented, and cbg was 61 at 800. Patient refused to eat or drink at the time, half an ampule of dextrose 50% was given per protocol. cbg was rechecked and was 121 at 900. Also patient's potassium was 2.8. 40 of kcl was given po. MD paged and made aware of both results.  New orders received, will continue to monitor.

## 2011-06-02 NOTE — Progress Notes (Signed)
Nutrition Follow-up  Diet Order:  Heart Healthy. PO intake 20-40% per flowsheet records. Glucerna Shake supplement BID.  Meds: Scheduled Meds:   . amLODipine  10 mg Oral Daily  . antiseptic oral rinse  15 mL Mouth Rinse BID  . dextrose      . feeding supplement  237 mL Oral BID BM  . folic acid  1 mg Oral Daily  . insulin aspart  2-15 Units Subcutaneous TID WC & HS  . insulin glargine  10 Units Subcutaneous QHS  . levothyroxine  75 mcg Oral QAC breakfast  . LORazepam  0.5 mg Oral BID  . metroNIDAZOLE  500 mg Oral Q8H  . multivitamins ther. w/minerals  1 tablet Oral Daily  . pantoprazole  40 mg Oral Q1200  . potassium chloride  40 mEq Oral Once  . potassium chloride  40 mEq Oral Once  . potassium chloride  40 mEq Oral Once  . thiamine  100 mg Oral Daily  . vancomycin  500 mg Oral Q6H  . DISCONTD: amLODipine  5 mg Oral Daily  . DISCONTD: potassium chloride  40 mEq Oral Once  . DISCONTD: potassium chloride  10 mEq Intravenous Q1 Hr x 4   Continuous Infusions:   . sodium chloride 20 mL/hr at 05/31/11 0840   PRN Meds:.acetaminophen (TYLENOL) oral liquid 160 mg/5 mL, albuterol, dextrose, haloperidol lactate, hydrALAZINE, ipratropium, sodium chloride  Labs:  CMP     Component Value Date/Time   NA 148* 06/02/2011 0530   K 2.8* 06/02/2011 0530   CL 117* 06/02/2011 0530   CO2 23 06/02/2011 0530   GLUCOSE 38* 06/02/2011 0530   BUN 20 06/02/2011 0530   CREATININE 1.38* 06/02/2011 0530   CALCIUM 7.9* 06/02/2011 0530   PROT 5.5* 05/28/2011 0610   ALBUMIN 2.4* 06/02/2011 0530   AST 69* 05/28/2011 0610   ALT 26 05/28/2011 0610   ALKPHOS 394* 05/28/2011 0610   BILITOT 2.2* 05/28/2011 0610   GFRNONAA 36* 06/02/2011 0530   GFRAA 42* 06/02/2011 0530     Intake/Output Summary (Last 24 hours) at 06/02/11 1200 Last data filed at 06/02/11 0900  Gross per 24 hour  Intake      0 ml  Output   1301 ml  Net  -1301 ml    Weight Status:  51 kg (12/11) -- stable  Nutrition Dx:  Inadequate  Oral Intake, ongoing  Goal:  Meet 90-100% of estimated nutrition needs with PO diet and supplementation, unmet Monitor: PO intake, weight, labs, I/O's  Intervention/Plan:  Continue Glucerna Shake PO BID (220 kcals, 9.9 gm protein per 8 fl oz can)  RD to follow for nutrition care plan   Alger Memos Pager #:  [phone]

## 2011-06-02 NOTE — Progress Notes (Signed)
Clinical Social Worker spoke with pt daughter to discuss pt d/c plans. Discussed that even after sending updated information to [facility] [place] and [place] they are unable to offer a bed. Clinical Social Worker also sent updated information to [facility] and called and Visual merchandiser has not yet heard a response from facility. Clinical Child psychotherapist provided pt daughter with bed offers in [place]. Pt daughter requested SNF search be expanded to [facility] and [facility] as well. Clinical Child psychotherapist expanded SNF search to [facility] and [facility]. Pt daughter is also open to the option of taking pt home with home health services and continuing the SNF search from home if pt and pt daughter are not satisfied with bed offers and pt becomes medically stable for discharge. Pt daughter is very supportive and cooperative and exploring all options for pt for D/C. Clinical Social Worker to follow up in regard to bed offers in [facility] and [facility] and continue discussing with pt daughter about d/c plan. Clinical Social Worker to facilitate pt D/C needs when pt medically ready for D/C.  Jacklynn Lewis, MSW, LCSWA  Clinical Social Work [phone]

## 2011-06-02 NOTE — Progress Notes (Signed)
Physical Therapy Treatment Patient Details Name: [name] MRN: [id number] DOB: 1933-07-30 Today's Date: 06/02/2011  PT Assessment/Plan  PT - Assessment/Plan Comments on Treatment Session: pt eager for OOB and participating well.   PT Plan: Discharge plan needs to be updated PT Frequency: Min 3X/week Follow Up Recommendations: Skilled nursing facility Equipment Recommended: Defer to next venue PT Goals  Acute Rehab PT Goals PT Goal: Supine/Side to Sit - Progress: Progressing toward goal PT Goal: Sit at [facility] Of Bed - Progress: Progressing toward goal PT Goal: Sit to Stand - Progress: Progressing toward goal PT Goal: Stand to Sit - Progress: Progressing toward goal PT Goal: Stand - Progress: Progressing toward goal PT Goal: Perform Home Exercise Program - Progress: Progressing toward goal  PT Treatment Precautions/Restrictions  Precautions Precautions: Fall Precaution Comments: Cognition puts pt at major risk of falls Required Braces or Orthoses: No Restrictions Weight Bearing Restrictions: No Mobility (including Balance) Bed Mobility Bed Mobility: Yes Supine to Sit: 3: Mod assist;With rails Supine to Sit Details (indicate cue type and reason): pt seems fearful of falling at EOB and cues needed for use of UEs, increase trunk flexion as pt tends to push posteriorly.   Sitting - Scoot to Edge of Bed: 2: Max assist Sitting - Scoot to [employer] of Bed Details (indicate cue type and reason): cues for trunk flexion to prevent posterior pushing.   Transfers Transfers: Yes Sit to Stand: 1: +2 Total assist;Patient percentage (comment);From bed;With upper extremity assist (pt 40%) Sit to Stand Details (indicate cue type and reason): cues for trunk/hip extension as pt stays very flexed and needs cues for UE placement.   Stand to Sit: 1: +2 Total assist;Patient percentage (comment);With upper extremity assist;To chair/3-in-1 (pt 30%) Stand to Sit Details: cues to get closer to  3-in-1/recliner and use of armrests.   Stand Pivot Transfers: 1: +2 Total assist;Patient percentage (comment) (pt 30%) Stand Pivot Transfer Details (indicate cue type and reason): pt remains very flexed during transfer despite cueing and facilitation.  cues for step-by-step through transfer as pt tends to stop and stand to "get ready", then becomes fatigued and needs increased A than when first started Ambulation/Gait Ambulation/Gait: No Stairs: No Wheelchair Mobility Wheelchair Mobility: No    Exercise    End of Session PT - End of Session Equipment Utilized During Treatment: Gait belt Activity Tolerance: Patient limited by fatigue Patient left: in chair;with call bell in reach (seat alarm) Nurse Communication: Mobility status for transfers General Behavior During Session: [name] for tasks performed Cognition: Impaired Cognitive Impairment: decreased attention to task and poor safety awareness  Sunny Schlein, [place] [phone] 06/02/2011, 11:00 AM

## 2011-06-02 NOTE — Progress Notes (Signed)
Clinical Social Worker faxed PT notes to [facility] and [facility] [place]. Clinical Social Worker to follow up with pt daughter and facilities. Clinical Social Worker to continue to follow for d/c planning.  Jacklynn Lewis, MSW, LCSWA  Clinical Social Work [phone]

## 2011-06-02 NOTE — Progress Notes (Signed)
Subjective: Patient seen and examined this morning. Was noted to be hypoglycemic and informs that she did not eat last night. Given 1/2 amp d50. Also noted to be hypokalemic.  Objective:  Vital signs in last 24 hours:  Filed Vitals:   06/01/11 2116 06/02/11 0556 06/02/11 1049 06/02/11 1330  BP: 154/90 131/80 123/74 115/79  Pulse: 85 78  97  Temp: 97.8 F (36.6 C) 97 F (36.1 C)  98.4 F (36.9 C)  TempSrc: Oral Axillary    Resp: 20 18  16   Height:      Weight:  51 kg (112 lb 7 oz)    SpO2: 96% 97%  94%    Intake/Output from previous day:   Intake/Output Summary (Last 24 hours) at 06/02/11 1608 Last data filed at 06/02/11 1500  Gross per 24 hour  Intake      0 ml  Output   1776 ml  Net  -1776 ml    Physical Exam:  General: elderly thin built female in no acute distress.  HEENT: no pallor, no icterus, moist oral mucosa, no JVD, no lymphadenopathy. IJ in place  Heart: Normal s1 &s2 Regular rate and rhythm, without murmurs, rubs, gallops.  Lungs: diminished breath sounds at lung bases  Abdomen: Soft, distended , non tender to palpation , positive bowel sounds. Foley in place  Extremities: No clubbing cyanosis or edema with positive pedal pulses.  Neuro: Alert, awake, and oriented x 3 nonfocal.   Lab Results:  Basic Metabolic Panel:    Component Value Date/Time   NA 148* 06/02/2011 0530   K 2.8* 06/02/2011 0530   CL 117* 06/02/2011 0530   CO2 23 06/02/2011 0530   BUN 20 06/02/2011 0530   CREATININE 1.38* 06/02/2011 0530   GLUCOSE 38* 06/02/2011 0530   CALCIUM 7.9* 06/02/2011 0530   CBC:    Component Value Date/Time   WBC 12.5* 06/01/2011 0500   HGB 8.1* 06/01/2011 0500   HCT 23.8* 06/01/2011 0500   PLT 158 06/01/2011 0500   MCV 95.6 06/01/2011 0500   NEUTROABS 7.7 05/31/2011 0500   LYMPHSABS 2.3 05/31/2011 0500   MONOABS 1.2* 05/31/2011 0500   EOSABS 0.0 05/31/2011 0500   BASOSABS 0.1 05/31/2011 0500    No results found for this or any previous visit  (from the past 240 hour(s)).  Studies/Results: No results found.  Medications: Scheduled Meds:   . amLODipine  10 mg Oral Daily  . antiseptic oral rinse  15 mL Mouth Rinse BID  . dextrose      . feeding supplement  237 mL Oral BID BM  . folic acid  1 mg Oral Daily  . insulin aspart  2-15 Units Subcutaneous TID WC & HS  . insulin glargine  10 Units Subcutaneous QHS  . levothyroxine  75 mcg Oral QAC breakfast  . LORazepam  0.5 mg Oral BID  . metroNIDAZOLE  500 mg Oral Q8H  . multivitamins ther. w/minerals  1 tablet Oral Daily  . pantoprazole  40 mg Oral Q1200  . potassium chloride  40 mEq Oral Once  . potassium chloride  40 mEq Oral Once  . potassium chloride  40 mEq Oral Once  . thiamine  100 mg Oral Daily  . vancomycin  500 mg Oral Q6H  . DISCONTD: amLODipine  5 mg Oral Daily  . DISCONTD: potassium chloride  40 mEq Oral Once  . DISCONTD: potassium chloride  10 mEq Intravenous Q1 Hr x 4   Continuous Infusions:   .  sodium chloride 20 mL/hr at 05/31/11 0840   PRN Meds:.acetaminophen (TYLENOL) oral liquid 160 mg/5 mL, albuterol, dextrose, haloperidol lactate, hydrALAZINE, ipratropium, sodium chloride  Assessment  77 y.o. female who is a heavy drinkerwas found down at home on 11/25 with altered MS, hypotension, hypoglycemic ( fsg in 20s) and covered in coffee ground emesis. Admitted to ICU on 11/25 for resp failure and septic shock intubate and extubated on 11/30. Hospital course complicated with cdiff pancolitis and oliguric renal failure.   Plan:  cdiff Pancolitis  pancolitis from CT 11/25  On po vanco. ( since 11/29) Pt was also on IV flagyl ( started on 11/29 after c diff pos , order expired briefly on 12/5) restarted flagyl to complete at least 14 day course for now ( until 12/12)  Off vanco enema  Repeat abd CT done on 12/5 with improvment in colitis . Shows worsening atelectasis/ b/l effusion) and gallstones and mild ascites  leucocytosis improving  Diarrhea improved.   Rectal tube dced on 12/7   Upper GI bleed with anemia  currently stable  S/p PRBC and initially treated with PPI drip and octeotide  Cont PPI  Possible sec to etoh gastritis  Will need outpt GI follow up  Seen by Dr Dickie La on 11/25. EGD done on 11/25 showing severe chr gastritis.   Oliguric ATN  Patient had CVVHD in ICU. Last done on 12/03  being follwed by renal  creatinine further worsened to 4.7 on 12/6 , has been improving now ( 1.37 today) She continues to make good urine ( 1300 cc past 24 hrs)  -renal recommend holding on HD as she doesnot clinically appear volume overloaded and now her creatinine slowly improving  2D echo done this admission with normal LV fn  Repeat CT abd and pelvis on 12/05 showing b/l pleural effusion that has increased along with ascites. This could explained with fluid retention. Also she does has hypoalbuminemia. No pedal edema on exam.  Monitor urine outpt closely .  Monitor creatinine fn daily  Renal signed off , ordered to remove temp dialysis catheter   Acute respiratory failure-  Resolved. Possibly related to aspiration extubated 11/30.  Prn nebs  B/l mild Effusions on xray and atalectasis ,. Repeat CT abd on 12/5 showing increased atalectasis and effusion .  Incentive spirometry, ambulate.  tolerating PT now   Confusion with encephalopathy  improving . She appears progressively oriented now  likely in the setting of septic shock.  . Cont haldol prn for agitation.   Alcohol induced fatty liver and  Hyperbilirubinemia-and transaminitis  ? Shock liver . She also has findings of gallstones on CT abd but no RUQ tenderness on exam. repeat LFTs on 12/6 slowly improving  Needs GI follow up as outpt  Thrombocytopenia  - likely due to septic shock and etoh abuse now improving.   Hypokalemia  Patient had k of 2.8 this am being repleted. Monitor in am   DM II-  A1c 6.6. fsg stable on Lantus and Novolog  Hypoglycemic this am ( 12/11) given 1/2 amp  d 50  Hypertriglyceridemia-  615 earlier this admission on 11/26.   CONSULTS:  Dickie La ( GI) signed off  renal ( webb) signed off   DVT prophylaxis  SCD boots   Daughter Alfredo Batty  involved in care . She is a pediatrician who lives in [place] and very cooperative.  Dispo: to SNF once bed available. Daughter wants pt to go to [place]. SW working on bed  availability. Plan updated this am.            LOS: 17 days   [name] 06/02/2011, 4:08 PM

## 2011-06-03 LAB — RENAL FUNCTION PANEL
BUN: 14 mg/dL (ref 6–23)
Calcium: 7.9 mg/dL — ABNORMAL LOW (ref 8.4–10.5)
Creatinine, Ser: 1.06 mg/dL (ref 0.50–1.10)
Glucose, Bld: 81 mg/dL (ref 70–99)
Phosphorus: 3.6 mg/dL (ref 2.3–4.6)

## 2011-06-03 LAB — GLUCOSE, CAPILLARY
Glucose-Capillary: 163 mg/dL — ABNORMAL HIGH (ref 70–99)
Glucose-Capillary: 134 mg/dL — ABNORMAL HIGH (ref 70–99)

## 2011-06-03 MED ORDER — DEXTROSE 5 % IV SOLN
INTRAVENOUS | Status: AC
  Administered 2011-06-03: 11:00:00 via INTRAVENOUS

## 2011-06-03 NOTE — Progress Notes (Signed)
Physical Therapy Treatment Patient Details Name: [name] MRN: [id number] DOB: 1933/10/18 Today's Date: 06/03/2011  PT Assessment/Plan  PT - Assessment/Plan Comments on Treatment Session: The patient continues to have a hard time with standing and transfers in an upright posture.  She tends to "ski" over to the chair with the walker.  If daughter takes her home before nursing home we will need to check her home situation and need for additional equipment.   PT Plan: Discharge plan remains appropriate;Frequency remains appropriate PT Frequency: Min 3X/week Follow Up Recommendations: Skilled nursing facility Equipment Recommended: Defer to next venue PT Goals  Acute Rehab PT Goals PT Goal: Supine/Side to Sit - Progress: Progressing toward goal PT Goal: Sit at [facility] Bed - Progress: Progressing toward goal Pt will go Sit to Stand: with mod assist PT Goal: Sit to Stand - Progress: Updated due to goal met Pt will go Stand to Sit: with mod assist PT Goal: Stand to Sit - Progress: Updated due to goals met PT Goal: Perform Home Exercise Program - Progress: Progressing toward goal  PT Treatment Precautions/Restrictions  Precautions Precautions: Fall Precaution Comments: is very impulsive Required Braces or Orthoses: No Restrictions Weight Bearing Restrictions: No Mobility (including Balance) Bed Mobility Supine to Sit: 4: Min assist;With rails;HOB elevated (Comment degrees) (HOB 40 degrees) Supine to Sit Details (indicate cue type and reason): min assist to support trunk to get to upright sitting.  Upon first sitting without feet touching floor that patient has a posterior lean.   Sitting - Scoot to Edge of Bed: 3: Mod assist Sitting - Scoot to Edge of Bed Details (indicate cue type and reason): mod assist to help to weight shift hips.  The patient was having trouble motor planning this activity.   Transfers Sit to Stand: 1: +2 Total assist;From bed;With armrests;From elevated  surface Sit to Stand Details (indicate cue type and reason): with RW, cues to push up with bed rail assist to support trunk over weak legs in standing.  The patient with extended knees, plantar flexion and hip flexion bilaterally.  Unable to anterior weight shift.  Patient 60% Stand to Sit: 1: +2 Total assist;With upper extremity assist;To chair/3-in-1;With armrests Stand to Sit Details: Patient holding onto RW despite cues to try to reach back for the chair.  Assist needed to control descent to sit and make sure that the patient was hitting her sitting target.   Stand Pivot Transfers: 1: +2 Total assist Stand Pivot Transfer Details (indicate cue type and reason): Patient 60%.  Patient with flexed hips extended knees and plantarflexed ankles during transfer.  Unable to tactilly or verball follow cues to correct.    Static Sitting Balance Static Sitting - Balance Support: Bilateral upper extremity supported;Feet supported Static Sitting - Level of Assistance: 2: Max assist Static Sitting - Comment/# of Minutes: max assist to prevent a fall anteriorly secondary to patient reaching for someting anteriorly and lost her balance.  Impulsive and quick to move.  Once setteled she could be close supervision for static sitting with bil. upper extremity support.   Exercise  General Exercises - Lower Extremity Ankle Circles/Pumps: AROM;Both;5 reps Heel Slides: AAROM;Both;5 reps (with therapist's resistance of extension (like a leg press)) Straight Leg Raises: AROM;Both;5 reps (more difficult on her right side.) End of Session PT - End of Session Equipment Utilized During Treatment: Gait belt Activity Tolerance: Patient limited by pain Patient left: in chair;with call bell in reach;with bed alarm set General Behavior During Session: [facility]  for tasks performed Cognition: Impaired Cognitive Impairment: decreased sustained attention to task, impulsive with decreased safety awareness.  Rollene Rotunda Giuseppina Quinones, PT,  DPT [phone]   06/03/2011, 11:47 AM

## 2011-06-03 NOTE — Progress Notes (Addendum)
Subjective: Chart reviewed. Patient indicates that he's feeling much better. She says her stools are more formed and has only had 2 stools in the last 24 hours. She denies any other complaints.  Objective:  Vital signs in last 24 hours:  Filed Vitals:   06/02/11 1330 06/02/11 2139 06/03/11 0500 06/03/11 0539  BP: 115/79 140/74  148/87  Pulse: 97 85  91  Temp: 98.4 F (36.9 C) 99.8 F (37.7 C)  98.2 F (36.8 C)  TempSrc:  Oral  Oral  Resp: 16 18  18   Height:      Weight:   49.8 kg (109 lb 12.6 oz)   SpO2: 94% 96%  93%    Intake/Output from previous day:   Intake/Output Summary (Last 24 hours) at 06/03/11 1002 Last data filed at 06/03/11 0500  Gross per 24 hour  Intake      0 ml  Output    875 ml  Net   -875 ml    Physical Exam:  General: elderly thin built female in no acute distress.  Respiratory system: Clear. Cardiovascular system: Telemetry shows sinus rhythm. First and second heart sounds heard, regular. Gastrointestinal system: Abdomen is nondistended, soft and normal bowel sounds heard. Central nervous system: Alert and oriented. No focal neurological deficits.  Lab Results:  Basic Metabolic Panel:    Component Value Date/Time   NA 150* 06/03/2011 0555   K 3.8 06/03/2011 0555   CL 119* 06/03/2011 0555   CO2 21 06/03/2011 0555   BUN 14 06/03/2011 0555   CREATININE 1.06 06/03/2011 0555   GLUCOSE 81 06/03/2011 0555   CALCIUM 7.9* 06/03/2011 0555   CBC:    Component Value Date/Time   WBC 12.5* 06/01/2011 0500   HGB 8.1* 06/01/2011 0500   HCT 23.8* 06/01/2011 0500   PLT 158 06/01/2011 0500   MCV 95.6 06/01/2011 0500   NEUTROABS 7.7 05/31/2011 0500   LYMPHSABS 2.3 05/31/2011 0500   MONOABS 1.2* 05/31/2011 0500   EOSABS 0.0 05/31/2011 0500   BASOSABS 0.1 05/31/2011 0500    No results found for this or any previous visit (from the past 240 hour(s)).  Studies/Results: No results found.  Medications: Scheduled Meds:    . amLODipine  10 mg Oral  Daily  . antiseptic oral rinse  15 mL Mouth Rinse BID  . feeding supplement  237 mL Oral BID BM  . folic acid  1 mg Oral Daily  . insulin aspart  2-15 Units Subcutaneous TID WC & HS  . insulin glargine  10 Units Subcutaneous QHS  . levothyroxine  75 mcg Oral QAC breakfast  . LORazepam  0.5 mg Oral BID  . metroNIDAZOLE  500 mg Oral Q8H  . multivitamins ther. w/minerals  1 tablet Oral Daily  . pantoprazole  40 mg Oral Q1200  . potassium chloride  40 mEq Oral Once  . potassium chloride  40 mEq Oral Once  . thiamine  100 mg Oral Daily  . vancomycin  500 mg Oral Q6H   Continuous Infusions:    . sodium chloride 20 mL/hr at 05/31/11 0840   PRN Meds:.acetaminophen (TYLENOL) oral liquid 160 mg/5 mL, albuterol, dextrose, haloperidol lactate, hydrALAZINE, ipratropium, sodium chloride  Assessment  77 y.o. female who is a heavy drinkerwas found down at home on 11/25 with altered MS, hypotension, hypoglycemic ( fsg in 20s) and covered in coffee ground emesis. Admitted to ICU on 11/25 for resp failure and septic shock intubate and extubated on 11/30. Hospital course complicated with  cdiff pancolitis and oliguric renal failure.    1. C. difficile colitis: Patient is on both Flagyl and vancomycin but unclear if she needs to be on both these versus just vancomycin alone. Will review gastroenterologist recommendation were discussed with them. Improving. 2. Hypernatremia: Discontinue normal saline KVO. Treat briefly with hypotonic IV fluids and encourage increased by mouth water intake and check BMP tomorrow. 3. Anemia: Status post packed red blood cell transfusion. Stable 4. Upper GI bleed secondary to alcoholic gastritis: Resolved 5. Oliguric acute renal failure secondary to ATN: Resolved 6. Status post ventilatory dependent respiratory failure, resolved: Possibly secondary to aspiration 7. Toxic metabolic encephalopathy: Resolved 8. Type 2 diabetes mellitus: No further hypoglycemic  episodes.  Daughter Alfredo Batty  involved in care . 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  Dispo: to SNF once bed available. Daughter wants pt to go to [place]. SW working on bed availability.   Addendum: Called and updated patient's daughter Ms. Alfredo Batty about patient's care.    LOS: 18 days   Verenice Westrich 06/03/2011, 10:02 AM

## 2011-06-03 NOTE — Progress Notes (Signed)
Occupational Therapy Treatment Patient Details Name: [name] MRN: [id number] DOB: Jun 01, 1934 Today's Date: 06/03/2011  OT Assessment/Plan OT Assessment/Plan Comments on Treatment Session: Pt. currently requires +2 assist for safe transfers and for sit to stand needed for LB ADLs.  If daughter takes pt. home, recommend that she have adequate assistance available OT Plan: Discharge plan remains appropriate OT Frequency: Min 1X/week Follow Up Recommendations: Skilled nursing facility Equipment Recommended: Defer to next venue OT Goals Acute Rehab OT Goals OT Goal Formulation: With patient Time For Goal Achievement: 2 weeks ADL Goals ADL Goal: Grooming - Progress: Progressing toward goals ADL Goal: Upper Body Bathing - Progress: Not addressed Pt Will Transfer to Toilet: with mod assist;3-in-1 ADL Goal: Toilet Transfer - Progress: Progressing toward goals ADL Goal: Additional Goal #1 - Progress: Not addressed  OT Treatment Precautions/Restrictions  Precautions Precautions: Fall Precaution Comments: is very impulsive with decreased awareness of deficits   ADL ADL Grooming: Performed;Wash/dry hands;Brushing hair;Supervision/safety (min verbal cues to stay on task) Where Assessed - Grooming: Sitting, chair Toilet Transfer: +2 Total assistance (Pt. ~45%) Toilet Transfer Details (indicate cue type and reason): Pt. keeps hips and knees flexed, and slides feet forward when in this partial standing position.  Pt. reached for handle on BSC and pushed the commode away from her, and unable to problem solve through correction, and unable to correct errors with max verbal and tactile cues.   (Pt. extremely high fall risk) Toilet Transfer Method: Stand pivot (partial stand) Acupuncturist: Bedside commode Toileting - Clothing Manipulation: Performed;+1 Total assistance (gown) Where Assessed - Toileting Clothing Manipulation: Standing Toileting - Hygiene: +2 Total assistance  (Pt. requires max assist to stand, and total assist to clean) Where Assessed - Toileting Hygiene: Standing ADL Comments: Pt. impulsive with poor problem solving, poor safety awareness and poor awareness of deificts.  Pt. blames balance difficulties on the grip socks - pt. pushes away from object she is moving toward, and pushes feet in front of her - does not seem to have proprioceptive/kinesthetic awareness of her position in space.  Pt. also easily distracted, and requires max cues to attempt to complete tasks in timely fashion Mobility  Transfers Transfers: Yes Exercises    End of Session OT - End of Session Activity Tolerance: Patient tolerated treatment well Patient left: in chair;with call bell in reach (chair alarm in place) Nurse Communication: Mobility status for transfers (RN present to assist with toilet transfer) General Behavior During Session: Restless Cognition: Impaired Cognitive Impairment: Pt. with impaired sustained attention ~30 seconds; impulsive, decreased awareness of deficits, impaired problem solving, impaired safetey awareness  Delissa Silba M  06/03/2011, 3:27 PM

## 2011-06-03 NOTE — Progress Notes (Signed)
Clinical Social Worker continuing to follow for SNF placement. Pt has a bed offer from [facility]. Pt daughter informed. Clinical Social Worker made referrals to multiple other facilities in [facility] at the request of pt daughter. Clinical Social Worker to follow up with pt and pt daughter with any further bed offers tomorrow and get final decision about disposition planning. Pt daughter states that she is agreeable to pt going to [facility] if no other bed offers become available. Clinical Social Worker to facilitate pt D/C needs when pt medically ready for D/C.  Jacklynn Lewis, MSW, LCSWA  Clinical Social Work [phone]

## 2011-06-04 ENCOUNTER — Encounter (HOSPITAL_COMMUNITY): Payer: Self-pay | Admitting: Cardiology

## 2011-06-04 LAB — RENAL FUNCTION PANEL
BUN: 8 mg/dL (ref 6–23)
CO2: 21 mEq/L (ref 19–32)
Calcium: 7.8 mg/dL — ABNORMAL LOW (ref 8.4–10.5)
Chloride: 110 mEq/L (ref 96–112)
Creatinine, Ser: 0.85 mg/dL (ref 0.50–1.10)
GFR calc non Af Amer: 65 mL/min — ABNORMAL LOW (ref >90)
Glucose, Bld: 108 mg/dL — ABNORMAL HIGH (ref 70–99)

## 2011-06-04 LAB — CBC
HCT: 23.3 % — ABNORMAL LOW (ref 36.0–46.0)
Hemoglobin: 7.7 g/dL — ABNORMAL LOW (ref 12.0–15.0)
MCHC: 33 g/dL (ref 30.0–36.0)
RBC: 2.42 MIL/uL — ABNORMAL LOW (ref 3.87–5.11)
WBC: 9.9 10*3/uL (ref 4.0–10.5)

## 2011-06-04 LAB — GLUCOSE, CAPILLARY
Glucose-Capillary: 112 mg/dL — ABNORMAL HIGH (ref 70–99)
Glucose-Capillary: 121 mg/dL — ABNORMAL HIGH (ref 70–99)
Glucose-Capillary: 171 mg/dL — ABNORMAL HIGH (ref 70–99)

## 2011-06-04 MED ORDER — VANCOMYCIN 50 MG/ML ORAL SOLUTION: ORAL | Status: DC

## 2011-06-04 MED ORDER — PANTOPRAZOLE SODIUM 40 MG PO TBEC: 40.0000 mg | DELAYED_RELEASE_TABLET | Freq: Every day | ORAL | Status: DC

## 2011-06-04 MED ORDER — THERA M PLUS PO TABS: 1.0000 | ORAL_TABLET | Freq: Every day | ORAL | Status: DC

## 2011-06-04 MED ORDER — THIAMINE HCL 100 MG PO TABS: 100.0000 mg | ORAL_TABLET | Freq: Every day | ORAL | Status: DC

## 2011-06-04 MED ORDER — FAMOTIDINE 20 MG PO TABS: 20.0000 mg | ORAL_TABLET | Freq: Every day | ORAL | Status: DC

## 2011-06-04 MED ORDER — FOLIC ACID 1 MG PO TABS: 1.0000 mg | ORAL_TABLET | Freq: Every day | ORAL | Status: DC

## 2011-06-04 MED ORDER — SACCHAROMYCES BOULARDII 250 MG PO CAPS: 250.0000 mg | ORAL_CAPSULE | Freq: Two times a day (BID) | ORAL | Status: AC

## 2011-06-04 MED ORDER — AMLODIPINE BESYLATE 10 MG PO TABS: 10.0000 mg | ORAL_TABLET | Freq: Every day | ORAL | Status: DC

## 2011-06-04 MED ORDER — GLUCERNA SHAKE PO LIQD: 237.0000 mL | Freq: Two times a day (BID) | ORAL | Status: DC

## 2011-06-04 MED ORDER — DEXTROSE 5 % IV SOLN
INTRAVENOUS | Status: DC
  Administered 2011-06-04: 06:00:00 via INTRAVENOUS

## 2011-06-04 MED ORDER — POTASSIUM CHLORIDE CRYS ER 20 MEQ PO TBCR
40.0000 meq | EXTENDED_RELEASE_TABLET | Freq: Once | ORAL | Status: AC
  Administered 2011-06-04: 40 meq via ORAL

## 2011-06-04 NOTE — Progress Notes (Signed)
Pt made final decision of choice for bed at [facility]. Clinical Social Worker facilitated pt D/C needs including contacting facility, family, and arranging ambulance transportation to [facility]. No further social work needs at this time.  Jacklynn Lewis, MSW, LCSWA  Clinical Social Work [phone]

## 2011-06-04 NOTE — Progress Notes (Signed)
Pt's bed alarmed and pt found on right side of bed on knees. Pt was lifted back into bed by nurses. Bed alarm was on, bed in low position, camera was on, 3 side rails were up. Pt was on side that rail was not up. VSS. Sugar 112. Alert and oriented x 3. Stated that she did not hurt anywhere and she did not hit her head - that she slipped out of the bed onto her knees, trying to fix her pillows. MD notified. Attempted to call daughter, but no answer. Did not leave message. Will monitor very closely. Duwaine Maxin, RN

## 2011-06-04 NOTE — Progress Notes (Signed)
Clinical Social Worker received phone call from pt daughter who confirmed pt chooses bed at [facility]. Pt daughter had questions in regard to rather of not pt daughter could transport pt by car. Clinical Social Worker stated that this Clinical Social Worker would have to discuss with MD. Clinical Social Worker contacted [facility] who confirmed that bed available today. Clinical Social Worker to facilitate pt D/C needs.  Jacklynn Lewis, MSW, LCSWA  Clinical Social Work [phone]

## 2011-06-04 NOTE — Discharge Summary (Signed)
Discharge Summary  [name] MR#: [id number]  DOB:11/18/1933  Date of Admission: 05/16/2011 Date of Discharge: 06/04/2011  Patient's PCP: Elby Showers, MD  Attending Physician:Kandie Keiper  Consults: 1. Pulmonary critical care team 2. Gastroenterology: [facility]: Dr. Leona Carry 3. Nephrology: [facility] 4. General surgery  Discharge Diagnoses: Active Problems:  C. difficile colitis  Acute renal failure  Gastritis  Encephalopathy acute  Acute respiratory failure  Anemia  Alcohol induced fatty liver  Hypothyroid  Hypokalemia  Septic shock  Thrombocytopenia  DM (diabetes mellitus)  Hypertriglyceridemia  Upper GI bleed  Brief Admitting History and Physical [name] is an 77 y.o. female heavy drinker who family had not been in contact with for a day or so who was found down at home on 11/25 w/ altered MS, hypotension, hypoglycemic (20s) and covered in coffee ground emesis. Admitted to critical care service   Discharge Medications Current Discharge Medication List    START taking these medications   Details  amLODipine (NORVASC) 10 MG tablet Take 1 tablet (10 mg total) by mouth daily.    famotidine (PEPCID) 20 MG tablet Take 1 tablet (20 mg total) by mouth daily.    feeding supplement (GLUCERNA SHAKE) LIQD Take 237 mLs by mouth 2 (two) times daily between meals.    folic acid (FOLVITE) 1 MG tablet Take 1 tablet (1 mg total) by mouth daily.    Multiple Vitamins-Minerals (MULTIVITAMINS THER. W/MINERALS) TABS Take 1 tablet by mouth daily.    saccharomyces boulardii (FLORASTOR) 250 MG capsule Take 1 capsule (250 mg total) by mouth 2 (two) times daily.   Comments: For one month, then discontinue..    thiamine 100 MG tablet Take 1 tablet (100 mg total) by mouth daily.    vancomycin (VANCOCIN) 50 mg/mL oral solution 500 mg orally 4 times daily for one more day then discontinue      CONTINUE these medications which have NOT CHANGED    Details  levothyroxine (SYNTHROID, LEVOTHROID) 75 MCG tablet Take 75 mcg by mouth daily.      nateglinide (STARLIX) 120 MG tablet Take 120 mg by mouth 3 (three) times daily before meals.      potassium chloride (KLOR-CON) 20 MEQ packet Take 20 mEq by mouth 2 (two) times daily.        STOP taking these medications     dextrose 50 % solution Comments:  Reason for Stopping:       ondansetron in dextrose solution Comments:  Reason for Stopping:          Hospital Course: 1. Cdiff Pancolitis : Patient was on IV Flagyl and vancomycin rectal enema's. Once she had improved this was changed to oral vancomycin and Flagyl. She has completed 14 days course of Flagyl. She is day #14 of 14 days of oral vancomycin. Discussed with the gastroenterology team who recommended 10-14 days of vancomycin and Florastor. Recommend avoiding precipitating medication such as PPIs or unnecessary antibiotics. Patient indicates that her stools are soft and has had 2-3 BMs in the last 24 hours. Improved 2. Upper GI bleed with anemia: EGD done on 11/25 showing severe chr gastritis. As per discussion with gastroenterology will change her Protonix to oral Pepcid. Her GI bleed has clinically resolved. 3. Oliguric ATN: Patient had CVVHD in ICU. Last done on 12/03 .resolved.  4. Acute respiratory failure- status post ventilatory dependent respiratory failure secondary to possible aspiration. Resolved and stable 5. Confusion with encephalopathy: Multifactorial secondary to hypoglycemia, acute illness. Improved or resolved. 6. Alcohol  induced fatty liver and Hyperbilirubinemia-and transaminitis:? Shock liver . She also has findings of gallstones on CT abd but no RUQ tenderness on exam. repeat LFTs on 12/6 slowly improving. Needs GI follow up as outpt  7. Thrombocytopenia: likely due to septic shock and etoh abuse. Resolved 8. Hypokalemia: Resume home dose of potassium and monitor BMP closely.  9. DM II- A1c 6.6. Suggesting  good outpatient control. Resume home dose of Starlix and monitor closely for hypoglycemia. 10. Hypernatremia: Improved  11. Anemia: Status post packed red blood cell transfusion. Stable . Overnight events noted. Patient bed alarm that patient was found on the side of the bed on her knees. Blood sugars were okay. She patient was oriented. She indicated that she slipped out of bed onto her knees while trying to fix her pillows. No evidence of injury was noted.   Day of Discharge BP 133/78  Pulse 83  Temp(Src) 98 F (36.7 C) (Oral)  Resp 20  Ht 5' (1.524 m)  Wt 51.665 kg (113 lb 14.4 oz)  BMI 22.24 kg/m2  SpO2 99%  LMP 05/17/1983  General exam: Patient is lying comfortably in bed in no obvious distress. She denies any complaints including pain. She says she's had 2-3 BMs over the last 24 hours which are soft. Respiratory system: Clear. Cardiovascular system: Telemetry shows sinus rhythm. First and second heart sounds heard, regular. No JVD. Gastrointestinal system: Abdomen is nondistended, soft and normal bowel sounds heard. Central nervous system: Patient is alert and oriented x3. No focal neurological deficits.   Results for orders placed during the hospital encounter of 05/16/11 (from the past 48 hour(s))  GLUCOSE, CAPILLARY     Status: Normal   Collection Time   06/02/11  4:30 PM      Component Value Range Comment   Glucose-Capillary 90  70 - 99 (mg/dL)   GLUCOSE, CAPILLARY     Status: Normal   Collection Time   06/02/11  8:16 PM      Component Value Range Comment   Glucose-Capillary 83  70 - 99 (mg/dL)    Comment 1 Notify RN     RENAL FUNCTION PANEL     Status: Abnormal   Collection Time   06/03/11  5:55 AM      Component Value Range Comment   Sodium 150 (*) 135 - 145 (mEq/L)    Potassium 3.8  3.5 - 5.1 (mEq/L) DELTA CHECK NOTED   Chloride 119 (*) 96 - 112 (mEq/L)    CO2 21  19 - 32 (mEq/L)    Glucose, Bld 81  70 - 99 (mg/dL)    BUN 14  6 - 23 (mg/dL)    Creatinine,  Ser 1.61  0.50 - 1.10 (mg/dL)    Calcium 7.9 (*) 8.4 - 10.5 (mg/dL)    Phosphorus 3.6  2.3 - 4.6 (mg/dL)    Albumin 2.4 (*) 3.5 - 5.2 (g/dL)    GFR calc non Af Amer 50 (*) >90 (mL/min)    GFR calc Af Amer 58 (*) >90 (mL/min)   GLUCOSE, CAPILLARY     Status: Normal   Collection Time   06/03/11  7:56 AM      Component Value Range Comment   Glucose-Capillary 82  70 - 99 (mg/dL)   GLUCOSE, CAPILLARY     Status: Abnormal   Collection Time   06/03/11 11:22 AM      Component Value Range Comment   Glucose-Capillary 163 (*) 70 - 99 (mg/dL)   GLUCOSE, CAPILLARY  Status: Abnormal   Collection Time   06/03/11  4:58 PM      Component Value Range Comment   Glucose-Capillary 129 (*) 70 - 99 (mg/dL)   GLUCOSE, CAPILLARY     Status: Abnormal   Collection Time   06/03/11  9:04 PM      Component Value Range Comment   Glucose-Capillary 134 (*) 70 - 99 (mg/dL)    Comment 1 Notify RN     GLUCOSE, CAPILLARY     Status: Abnormal   Collection Time   06/04/11  4:28 AM      Component Value Range Comment   Glucose-Capillary 112 (*) 70 - 99 (mg/dL)   RENAL FUNCTION PANEL     Status: Abnormal   Collection Time   06/04/11  6:00 AM      Component Value Range Comment   Sodium 140  135 - 145 (mEq/L)    Potassium 3.2 (*) 3.5 - 5.1 (mEq/L)    Chloride 110  96 - 112 (mEq/L)    CO2 21  19 - 32 (mEq/L)    Glucose, Bld 108 (*) 70 - 99 (mg/dL)    BUN 8  6 - 23 (mg/dL)    Creatinine, Ser 1.91  0.50 - 1.10 (mg/dL)    Calcium 7.8 (*) 8.4 - 10.5 (mg/dL)    Phosphorus 3.1  2.3 - 4.6 (mg/dL)    Albumin 2.4 (*) 3.5 - 5.2 (g/dL)    GFR calc non Af Amer 65 (*) >90 (mL/min)    GFR calc Af Amer 76 (*) >90 (mL/min)   CBC     Status: Abnormal   Collection Time   06/04/11  6:00 AM      Component Value Range Comment   WBC 9.9  4.0 - 10.5 (K/uL)    RBC 2.42 (*) 3.87 - 5.11 (MIL/uL)    Hemoglobin 7.7 (*) 12.0 - 15.0 (g/dL)    HCT 47.8 (*) 29.5 - 46.0 (%)    MCV 96.3  78.0 - 100.0 (fL)    MCH 31.8  26.0 - 34.0 (pg)     MCHC 33.0  30.0 - 36.0 (g/dL)    RDW 62.1 (*) 30.8 - 15.5 (%)    Platelets 186  150 - 400 (K/uL)   GLUCOSE, CAPILLARY     Status: Abnormal   Collection Time   06/04/11  7:55 AM      Component Value Range Comment   Glucose-Capillary 121 (*) 70 - 99 (mg/dL)   GLUCOSE, CAPILLARY     Status: Abnormal   Collection Time   06/04/11 12:01 PM      Component Value Range Comment   Glucose-Capillary 171 (*) 70 - 99 (mg/dL)     Ct Abdomen Pelvis Wo Contrast  05/27/2011  *RADIOLOGY REPORT*  Clinical Data: Pancreatitis.  CT ABDOMEN AND PELVIS WITHOUT CONTRAST  Technique:  Multidetector CT imaging of the abdomen and pelvis was performed following the standard protocol without intravenous contrast.  Comparison: 05/17/2011  Findings: Imaging through the lung bases shows slight progression of bibasilar collapse / consolidation.  Small bilateral pleural effusions are noted.  The liver is upper normal for size and heterogeneous in attenuation.  Calcified granulomata are noted in the spleen.  Tiny hiatal hernia noted.  Stomach otherwise unremarkable.  The duodenum, adrenal glands, and kidneys are unremarkable.  There is some high-density material in the neck of the gallbladder, raising the question gallstones.  The pancreatic head and body are unremarkable.  Pancreatic tail is somewhat ill-defined  but there is no substantial peri pancreatic edema or inflammation.  Stable thickening of the adrenal glands noted.  Vascular calcification is seen in the hilum of each kidney.  No abdominal aortic aneurysm.  No retroperitoneal lymphadenopathy.  The diffuse colonic wall thickening seen in the abdominal segments of the colon on the previous study has essentially resolved.  There is some free fluid around the liver and spleen.  Imaging through the pelvis shows free fluid.  A rectal tube is visualized.  Foley catheter decompresses the urinary bladder and air in the bladder is presumably secondary to the instrumentation.  Diverticular changes are noted in the sigmoid colon without evidence for diverticulitis.  Terminal ileum is normal. The appendix is not visualized, but there is no edema or inflammation in the region of the cecum.  Body wall edema is seen in the pelvis.  Degenerative changes are noted in both SI joints and facets of the lower lumbar spine.  IMPRESSION: The diffuse colonic wall thickening seen previously has resolved in the interval.  There is some residual ascites.  Worsening bilateral lower lobe collapse / consolidation with effusions.  Question gallstones.  Original Report Authenticated By: ERIC A. MANSELL, M.D.   Ct Abdomen Pelvis Wo Contrast  05/17/2011  *RADIOLOGY REPORT*  Clinical Data: Sepsis.  Intra-abdominal abscess.  CT ABDOMEN AND PELVIS WITHOUT CONTRAST  Technique:  Multidetector CT imaging of the abdomen and pelvis was performed following the standard protocol without intravenous contrast.  Comparison: None.  Findings: Small bilateral pleural effusions are present. Consolidation is present in the right infrahilar region.  Dependent atelectasis is present.  Coronary artery atherosclerosis is present. If office based assessment of coronary risk factors has not been performed, it is now recommended.  Nasogastric tube is present which terminates at the antrum of the stomach.  Geographic fatty infiltration of the liver is present. Small amount of ascites surrounds the liver.  Old granulomatous disease of the spleen.  Low density lesion is present in the left adrenal gland, probably representing a small adenoma.  Aortic and visceral atherosclerosis.  Small infrarenal abdominal aortic aneurysm is fusiform and measures 22 mm.  The iliofemoral atherosclerosis is also present. Fatty atrophy of the pancreas. Biliary sludge is present.  No calcified gallstones are identified.  Foley catheter in the urinary bladder.  No small bowel obstruction. Diffuse marked colonic thickening, extending from rectum to cecum.  In hospitalized patient, this most likely represent C difficile colitis however other forms of colitis can produce this appearance. The distribution would be unusual for ischemic colitis. Inflammatory bowel disease is always a consideration and colitis. Bilateral hip osteoarthritis is present.  Pubic symphysis degenerative disease.  Severe sclerosis on both sides of the SI joint with vacuum joint on the right.  This could be associated with seronegative spondyloarthropathies or ordinary degenerative change.  Multilevel lumbar facet arthrosis.    Small calcification is present in the right renal hilum, probably vascular rather than renal calculus.  IMPRESSION: 1.  Small bilateral pleural effusions with dependent atelectasis. 2.  Right infrahilar airspace consolidation suspicious for pneumonia or aspiration. 3.  Atherosclerosis and coronary artery disease. 4.  Fatty liver. 5.  Old granulomatous disease of the spleen. 6.  Diffuse pan colitis.  Question C difficile colitis versus inflammatory bowel disease. 7.  Destructive changes of the SI joints.  Question seronegative spondyloarthropathy.  This could also represent severe degenerative changes. 8.  3 cm infrarenal abdominal aortic aneurysm.  Original Report Authenticated By: Andreas Newport, M.D.  Dg Chest Port 1 View  05/25/2011  *RADIOLOGY REPORT*  Clinical Data: Shortness of breath.  PORTABLE CHEST - 1 VIEW  Comparison: 05/24/2011  Findings: Right and left jugular vein catheter tips are in the superior vena cava.  The patient has moderate bilateral pleural effusions, slightly diminished.  Heart size and vascularity are normal.  No pulmonary infiltrates.  IMPRESSION: Slight decrease in moderate bilateral pleural effusions.  Original Report Authenticated By: Gwynn Burly, M.D.   Dg Chest Port 1 View  05/24/2011  *RADIOLOGY REPORT*  Clinical Data: CHF, pneumonia.  Chest congestion.  Post dialysis. Evaluate for change.  PORTABLE CHEST - 1 VIEW  Comparison:  05/24/2011  Findings: Support devices are in stable position.  Cardiomegaly with bilateral perihilar and lower lobe opacities and suspected layering effusions.  No change since prior study.  IMPRESSION: No significant change bilateral lower lobe opacities and effusions.  Original Report Authenticated By: Cyndie Chime, M.D.   Dg Chest Port 1 View  05/24/2011  *RADIOLOGY REPORT*  Clinical Data: Intubated.  PORTABLE CHEST - 1 VIEW  Comparison: 05/23/2011  Findings: No endotracheal tube is present.  Bilateral internal jugular central lines are in place, unchanged.  There is cardiomegaly with bilateral perihilar and lower lobe opacities. Suspect layering effusions.  No real change in the appearance of the lungs.  IMPRESSION: No interval change.  Original Report Authenticated By: Cyndie Chime, M.D.   Dg Chest Port 1 View  05/23/2011  *RADIOLOGY REPORT*  Clinical Data: Extubation  PORTABLE CHEST - 1 VIEW  Comparison: May 22, 2011  Findings: The ET tube has been removed.  The right IJ sheath remains unchanged at the SVC level.  The left IJ central line is also unchanged at the SVC level.  Cardiomegaly and mild pulmonary edema persist.  Small bilateral effusions have increased in size, right greater than left.  IMPRESSION: Cardiomegaly and pulmonary edema.  Small bilateral effusions have increased, right greater than left.  Original Report Authenticated By: Brandon Melnick, M.D.   Dg Chest Port 1 View  05/22/2011  *RADIOLOGY REPORT*  Clinical Data: Evaluate endotracheal tube placement.  PORTABLE CHEST - 1 VIEW  Comparison: 1 day prior  Findings: Endotracheal tube is appropriately position, 3.4 cm above the carina.  Right IJ Cordis sheath and left internal jugular line unchanged.  Nasogastric tube extends beyond the  inferior aspect of the film.  Cardiomegaly accentuated by AP portable technique.  No pleural effusion or pneumothorax.  Mild pulmonary venous congestion is similar.  Patchy lower lobe  predominant airspace disease.  Not significantly changed.  IMPRESSION:  1. No significant change since the prior exam. 2.  Stable support apparatus. 3.  Pulmonary venous congestion with lower lobe predominant airspace disease, likely atelectasis.  Original Report Authenticated By: Consuello Bossier, M.D.   Dg Chest Port 1 View  05/21/2011  *RADIOLOGY REPORT*  Clinical Data: Assess endotracheal tube position.  PORTABLE CHEST - 1 VIEW  Comparison: Chest x-ray 05/20/2011.  Findings: The endotracheal tube remains and good position, 4.0 cm above the carina.  The right and left IJ catheters are stable.  The NG tube is stable.  Persistent vascular congestion and areas of atelectasis.  IMPRESSION:  1.  Stable support apparatus. 2.  Persistent vascular congestion and areas of atelectasis.  Original Report Authenticated By: P. Loralie Champagne, M.D.   Dg Chest Port 1 View  05/20/2011  *RADIOLOGY REPORT*  Clinical Data: 77 year old female - evaluate endotracheal tube placement and right lung process.  PORTABLE CHEST - 1 VIEW  Comparison: 05/18/2011 and prior chest radiographs  Findings: An endotracheal tube is again identified with tip 3.5 cm above the carina.  An NG tube is identified with tip overlying the proximal - mid stomach. A right IJ central venous catheter and a left IJ central venous catheter are again noted with tips overlying the upper SVC. Slightly improved lung volumes noted with mild pulmonary vascular congestion. Focal mid lung opacities are not significantly changed and may represent atelectasis and / or airspace disease. There is no evidence of pneumothorax or large pleural effusions.  IMPRESSION: Slightly improved lung volumes, otherwise stable chest radiograph.  Original Report Authenticated By: Rosendo Gros, M.D.   Dg Chest Port 1 View  05/18/2011  *RADIOLOGY REPORT*  Clinical Data: Status post temporary dialysis catheter placement.  PORTABLE CHEST - 1 VIEW  Comparison: 0330 hours  Findings: Film  at 1740 hours shows interval placement of a right jugular temporary dialysis catheter.  The tip lies at the level of the proximal SVC.  No pneumothorax.  Left jugular central line tip also in close proximity and in stable position.  Endotracheal tube tip approximately 3.5 cm above the carina.  Lungs show reduced volumes since the prior study with some persistent edema present.  Stable cardiomegaly.  IMPRESSION: Newly placed temporary dialysis catheter tip at the level of the proximal SVC.  No pneumothorax after placement.  Original Report Authenticated By: Reola Calkins, M.D.   Dg Chest Port 1 View  05/17/2011  *RADIOLOGY REPORT*  Clinical Data: Endotracheal tube placement.  PORTABLE CHEST - 1 VIEW  Comparison: Chest radiograph performed earlier today at 12:38 a.m.  Findings: The patient's endotracheal tube is seen ending 3 cm above the carina.  An enteric tube is noted extending into the stomach. A left IJ line is noted ending about the proximal SVC.  The lungs are relatively well expanded.  Vascular congestion is noted, with bilateral central airspace opacities, likely reflecting mild interstitial edema, similar in appearance to the prior study. No pleural effusion or pneumothorax is seen.  The cardiomediastinal silhouette is borderline enlarged.  No acute osseous abnormalities are identified.  IMPRESSION:  1.  Endotracheal tube seen ending 3 cm above the carina. 2.  Vascular congestion and borderline cardiomegaly, with bilateral central airspace opacities, likely reflecting relatively stable mild interstitial edema.  Original Report Authenticated By: Tonia Ghent, M.D.   Dg Chest Portable 1 View  05/17/2011  *RADIOLOGY REPORT*  Clinical Data: Left jugular line placement  PORTABLE CHEST - 1 VIEW  Comparison: Portable exam 0038 hours compared to 05/16/2011  Findings: Left jugular line, tip projecting over left SVC near/ at the azygos vein confluence. Mild enlargement of cardiac silhouette with  pulmonary vascular congestion. Bilateral interstitial infiltrates, new, question pulmonary edema. No pneumothorax.  IMPRESSION: Tip of left jugular line projects over the SVC at or very near the azygos confluence. Enlargement of cardiac silhouette with pulmonary vascular congestion and question new pulmonary edema.  Original Report Authenticated By: Lollie Marrow, M.D.   Dg Chest Portable 1 View  05/16/2011  *RADIOLOGY REPORT*  Clinical Data: Altered level of consciousness; hypotension.  PORTABLE CHEST - 1 VIEW  Comparison: Chest radiograph performed 01/30/2011  Findings: The lungs are well-aerated and clear.  There is no evidence of focal opacification, pleural effusion or pneumothorax.  The cardiomediastinal silhouette is mildly enlarged.  No acute osseous abnormalities are seen.  IMPRESSION: No acute cardiopulmonary process seen; mild cardiomegaly again noted.  Original Report  Authenticated By: Tonia Ghent, M.D.     Disposition: Discharge to skilled nursing facility in stable condition  Diet: Heart healthy and diabetic  Activity: Increase activity gradually  Follow-up Appts: Discharge Orders    Future Orders Please Complete By Expires   Diet - low sodium heart healthy      Increase activity slowly      Call MD for:  temperature >100.4      Call MD for:  persistant nausea and vomiting      Call MD for:  severe uncontrolled pain         TESTS THAT NEED FOLLOW-UP CBC and CMP in 5-7 days from hospital discharge  Time spent on discharge, talking to the patient, and coordinating care: 60 mins.   SignedMarcellus Scott, MD 06/04/2011, 1:20 PM

## 2011-12-07 ENCOUNTER — Encounter (HOSPITAL_COMMUNITY): Payer: Self-pay | Admitting: *Deleted

## 2011-12-07 ENCOUNTER — Emergency Department (HOSPITAL_COMMUNITY): Payer: Medicare Other

## 2011-12-07 ENCOUNTER — Emergency Department (HOSPITAL_COMMUNITY)
Admission: EM | Admit: 2011-12-07 | Discharge: 2011-12-07 | Disposition: A | Discharge status: Home / Self Care | Payer: Medicare Other | Attending: Emergency Medicine | Admitting: Emergency Medicine

## 2011-12-07 DIAGNOSIS — J449 Chronic obstructive pulmonary disease, unspecified: Secondary | ICD-10-CM | POA: Insufficient documentation

## 2011-12-07 DIAGNOSIS — R51 Headache: Secondary | ICD-10-CM | POA: Insufficient documentation

## 2011-12-07 DIAGNOSIS — W19XXXA Unspecified fall, initial encounter: Secondary | ICD-10-CM

## 2011-12-07 DIAGNOSIS — F17200 Nicotine dependence, unspecified, uncomplicated: Secondary | ICD-10-CM | POA: Insufficient documentation

## 2011-12-07 DIAGNOSIS — R296 Repeated falls: Secondary | ICD-10-CM | POA: Insufficient documentation

## 2011-12-07 DIAGNOSIS — J4489 Other specified chronic obstructive pulmonary disease: Secondary | ICD-10-CM | POA: Insufficient documentation

## 2011-12-07 DIAGNOSIS — S0003XA Contusion of scalp, initial encounter: Secondary | ICD-10-CM | POA: Insufficient documentation

## 2011-12-07 DIAGNOSIS — Z79899 Other long term (current) drug therapy: Secondary | ICD-10-CM | POA: Insufficient documentation

## 2011-12-07 DIAGNOSIS — S0083XA Contusion of other part of head, initial encounter: Secondary | ICD-10-CM | POA: Insufficient documentation

## 2011-12-07 DIAGNOSIS — IMO0002 Reserved for concepts with insufficient information to code with codable children: Secondary | ICD-10-CM | POA: Insufficient documentation

## 2011-12-07 DIAGNOSIS — E039 Hypothyroidism, unspecified: Secondary | ICD-10-CM | POA: Insufficient documentation

## 2011-12-07 DIAGNOSIS — Z9181 History of falling: Secondary | ICD-10-CM | POA: Insufficient documentation

## 2011-12-07 DIAGNOSIS — E119 Type 2 diabetes mellitus without complications: Secondary | ICD-10-CM | POA: Insufficient documentation

## 2011-12-07 DIAGNOSIS — N390 Urinary tract infection, site not specified: Secondary | ICD-10-CM

## 2011-12-07 LAB — COMPREHENSIVE METABOLIC PANEL
Albumin: 4.1 g/dL (ref 3.5–5.2)
BUN: 6 mg/dL (ref 6–23)
Calcium: 8.7 mg/dL (ref 8.4–10.5)
GFR calc Af Amer: >90 mL/min (ref >90)
Glucose, Bld: 70 mg/dL (ref 70–99)
Sodium: 146 mEq/L — ABNORMAL HIGH (ref 135–145)
Total Protein: 7.5 g/dL (ref 6.0–8.3)

## 2011-12-07 LAB — URINALYSIS, ROUTINE W REFLEX MICROSCOPIC
Bilirubin Urine: NEGATIVE
Glucose, UA: NEGATIVE mg/dL
Hgb urine dipstick: NEGATIVE
Nitrite: NEGATIVE
Specific Gravity, Urine: 1.015 (ref 1.005–1.030)
pH: 7 (ref 5.0–8.0)

## 2011-12-07 LAB — DIFFERENTIAL
Basophils Absolute: 0.1 10*3/uL (ref 0.0–0.1)
Lymphocytes Relative: 46 % (ref 12–46)
Lymphs Abs: 3 10*3/uL (ref 0.7–4.0)
Monocytes Absolute: 0.4 10*3/uL (ref 0.1–1.0)
Monocytes Relative: 6 % (ref 3–12)
Neutro Abs: 3.1 10*3/uL (ref 1.7–7.7)

## 2011-12-07 LAB — CBC
HCT: 31.4 % — ABNORMAL LOW (ref 36.0–46.0)
Hemoglobin: 11 g/dL — ABNORMAL LOW (ref 12.0–15.0)
WBC: 6.6 10*3/uL (ref 4.0–10.5)

## 2011-12-07 LAB — URINE MICROSCOPIC-ADD ON

## 2011-12-07 MED ORDER — CIPROFLOXACIN HCL 500 MG PO TABS
500.0000 mg | ORAL_TABLET | Freq: Once | ORAL | Status: AC
  Administered 2011-12-07: 500 mg via ORAL
  Filled 2011-12-07: qty 1

## 2011-12-07 MED ORDER — CIPROFLOXACIN HCL 500 MG PO TABS: 500.0000 mg | ORAL_TABLET | Freq: Two times a day (BID) | ORAL | Status: AC

## 2011-12-07 NOTE — Discharge Instructions (Signed)

## 2011-12-07 NOTE — ED Notes (Signed)
PT got up this am and did not feel good.  Pt felt weak.  Pt appears to have had some frequent falls and has abrasins to forehead and arm.  Pt called EMS and thought she was going to die.  Blood sugar was good and BP on high side.    PT unsure and thinks she hit head.  Pt has posterior hematoma.  Pt had syncopal episode.

## 2011-12-07 NOTE — ED Notes (Signed)
Per patient report, pt had a mechanical fall today at home when she lost her balance.  Patient is alert and oriented x3.  Patient with no neuro deficits noted at this time.  Patient with equal bilateral hand grips.  Symmetrical face.

## 2011-12-07 NOTE — ED Notes (Signed)
Discharge instructions discussed with patient.  Pt and family verbalized understanding.

## 2011-12-07 NOTE — ED Notes (Signed)
Family at bedside. 

## 2011-12-07 NOTE — ED Notes (Signed)
Phone call from patient's daughter, PCP reported patient tested positive for a UTI this weekend

## 2011-12-07 NOTE — ED Provider Notes (Addendum)
History     CSN: [id number]  Arrival date & time 12/07/11  1311   First MD Initiated Contact with Patient 12/07/11 1827      No chief complaint on file.   (Consider location/radiation/quality/duration/timing/severity/associated sxs/prior treatment) HPI Pt brought to the ED by family she has apparently had several falls lately including last night when she fell while getting up to go to the bathroom, hitting her head on the floor. She is complaining of mild headache but otherwise states she is doing well. She was recently seen by her PCP who found her to have a UTI although she apparently has not gotten a Rx filled for same. Family at the bedside expresses concern over her safety but admits that despite their best efforts she refuses placement in a SNF. She has home health and rails around her house to help prevent falls but those resources are not available at night.   Past Medical History  Diagnosis Date  . Abdominal pain   . Hypothyroid   . Diabetes mellitus   . COPD (chronic obstructive pulmonary disease)   . GERD (gastroesophageal reflux disease)   . Blood transfusion     Past Surgical History  Procedure Date  . Abdominal hysterectomy   . Esophagogastroduodenoscopy 05/17/2011    Procedure: ESOPHAGOGASTRODUODENOSCOPY (EGD);  Surgeon: Hart Carwin, MD;  Location: [facility] ENDOSCOPY;  Service: Endoscopy;  Laterality: N/A;    Family History  Problem Relation Age of Onset  . Stroke Mother     History  Substance Use Topics  . Smoking status: Smoker, Current Status Unknown    Types: Cigarettes  . Smokeless tobacco: Not on file  . Alcohol Use: Yes     Uknown How much, daughter thinks she drinks heavy    OB History    Grav Para Term Preterm Abortions TAB SAB Ect Mult Living                  Review of Systems All other systems reviewed and are negative except as noted in HPI.   Allergies  Review of patient's allergies indicates no known allergies.  Home Medications    Current Outpatient Rx  Name Route Sig Dispense Refill  . LEVOTHYROXINE SODIUM 100 MCG PO TABS Oral Take 100 mcg by mouth daily.    Carma Leaven M PLUS PO TABS Oral Take 1 tablet by mouth daily.    Marland Kitchen POTASSIUM CHLORIDE 20 MEQ PO PACK Oral Take 20 mEq by mouth daily.       BP 170/97  Pulse 80  Temp 98.8 F (37.1 C) (Oral)  Resp 18  SpO2 98%  LMP 05/17/1983  Physical Exam  Nursing note and vitals reviewed. Constitutional: She is oriented to person, place, and time. She appears well-developed and well-nourished.  HENT:  Head: Normocephalic.       Posterior scalp hematoma  Eyes: EOM are normal. Pupils are equal, round, and reactive to light.  Neck: Normal range of motion. Neck supple.  Cardiovascular: Normal rate, normal heart sounds and intact distal pulses.   Pulmonary/Chest: Effort normal and breath sounds normal.  Abdominal: Bowel sounds are normal. She exhibits no distension. There is no tenderness.  Musculoskeletal: Normal range of motion. She exhibits no edema and no tenderness.  Neurological: She is alert and oriented to person, place, and time. She has normal strength. No cranial nerve deficit or sensory deficit.  Skin: Skin is warm and dry. No rash noted.       Superficial abrasions to R  elbow and L ankle  Psychiatric: She has a normal mood and affect.    ED Course  Procedures (including critical care time)  Labs Reviewed  COMPREHENSIVE METABOLIC PANEL - Abnormal; Notable for the following:    Sodium 146 (*)     Potassium 3.2 (*)     Creatinine, Ser 0.40 (*)     AST 203 (*)     ALT 54 (*)     All other components within normal limits  CBC - Abnormal; Notable for the following:    RBC 3.18 (*)     Hemoglobin 11.0 (*)     HCT 31.4 (*)     MCH 34.6 (*)     RDW 16.9 (*)     Platelets 131 (*)     All other components within normal limits  URINALYSIS, ROUTINE W REFLEX MICROSCOPIC - Abnormal; Notable for the following:    APPearance CLOUDY (*)     Ketones, ur 15 (*)      Leukocytes, UA TRACE (*)     All other components within normal limits  URINE MICROSCOPIC-ADD ON - Abnormal; Notable for the following:    Bacteria, UA MANY (*)     All other components within normal limits  DIFFERENTIAL  GLUCOSE, CAPILLARY   Ct Head Wo Contrast  12/07/2011  *RADIOLOGY REPORT*  Clinical Data: Fall.  CT HEAD WITHOUT CONTRAST  Technique:  Contiguous axial images were obtained from the base of the skull through the vertex without contrast.  Comparison: Brain MRI 11/08/2010.  Head CT scan 02/07/2010.  Findings: There is cortical atrophy and chronic microvascular ischemic change.  No evidence of acute intracranial abnormality including infarction, hemorrhage, mass lesion, mass effect, midline shift or abnormal extra-axial fluid collection.  Scalp hematoma to the left of midline posteriorly is noted.  No underlying fracture.  IMPRESSION:  1.  Scalp hematoma without underlying fracture or acute intracranial abnormality. 2.  Atrophy and chronic microvascular ischemic change.  Original Report Authenticated By: Bernadene Bell. Maricela Curet, M.D.     No diagnosis found.    MDM  Pt with UTI but otherwise not significant abnormalities on labs or imaging. Her primary concern was injury from her fall. Family is concerned about her at home but again, she is refusing any SNF placement. Will Rx Cipro and advised close PCP followup.         Ananya Mccleese B. Bernette Mayers, MD 12/07/11 2006   Date: 01/07/2012  Rate: 80  Rhythm: normal sinus rhythm  QRS Axis: normal  Intervals: normal  ST/T Wave abnormalities: normal  Conduction Disutrbances: none  Narrative Interpretation: unremarkable      Sharika Mosquera B. Bernette Mayers, MD 01/07/12 [phone]

## 2011-12-07 NOTE — ED Notes (Signed)
CBG 76 Rn notified [place]

## 2012-01-03 ENCOUNTER — Emergency Department (HOSPITAL_COMMUNITY): Payer: Medicare Other

## 2012-01-03 ENCOUNTER — Inpatient Hospital Stay (HOSPITAL_COMMUNITY)
Admission: EM | Admit: 2012-01-03 | Discharge: 2012-01-07 | DRG: 690 | Disposition: A | Discharge status: Skilled Nursing Facility | Payer: Medicare Other | Attending: Internal Medicine | Admitting: Internal Medicine

## 2012-01-03 ENCOUNTER — Encounter (HOSPITAL_COMMUNITY): Payer: Self-pay | Admitting: *Deleted

## 2012-01-03 DIAGNOSIS — K700 Alcoholic fatty liver: Secondary | ICD-10-CM

## 2012-01-03 DIAGNOSIS — E876 Hypokalemia: Secondary | ICD-10-CM

## 2012-01-03 DIAGNOSIS — K2970 Gastritis, unspecified, without bleeding: Secondary | ICD-10-CM

## 2012-01-03 DIAGNOSIS — F10239 Alcohol dependence with withdrawal, unspecified: Secondary | ICD-10-CM | POA: Diagnosis present

## 2012-01-03 DIAGNOSIS — R4182 Altered mental status, unspecified: Secondary | ICD-10-CM | POA: Diagnosis present

## 2012-01-03 DIAGNOSIS — R531 Weakness: Secondary | ICD-10-CM

## 2012-01-03 DIAGNOSIS — N390 Urinary tract infection, site not specified: Principal | ICD-10-CM | POA: Diagnosis present

## 2012-01-03 DIAGNOSIS — A0472 Enterocolitis due to Clostridium difficile, not specified as recurrent: Secondary | ICD-10-CM

## 2012-01-03 DIAGNOSIS — G9340 Encephalopathy, unspecified: Secondary | ICD-10-CM

## 2012-01-03 DIAGNOSIS — F17200 Nicotine dependence, unspecified, uncomplicated: Secondary | ICD-10-CM | POA: Diagnosis present

## 2012-01-03 DIAGNOSIS — A419 Sepsis, unspecified organism: Secondary | ICD-10-CM

## 2012-01-03 DIAGNOSIS — E119 Type 2 diabetes mellitus without complications: Secondary | ICD-10-CM | POA: Diagnosis present

## 2012-01-03 DIAGNOSIS — J449 Chronic obstructive pulmonary disease, unspecified: Secondary | ICD-10-CM | POA: Diagnosis present

## 2012-01-03 DIAGNOSIS — N179 Acute kidney failure, unspecified: Secondary | ICD-10-CM

## 2012-01-03 DIAGNOSIS — D696 Thrombocytopenia, unspecified: Secondary | ICD-10-CM

## 2012-01-03 DIAGNOSIS — K219 Gastro-esophageal reflux disease without esophagitis: Secondary | ICD-10-CM | POA: Diagnosis present

## 2012-01-03 DIAGNOSIS — E781 Pure hyperglyceridemia: Secondary | ICD-10-CM

## 2012-01-03 DIAGNOSIS — J4489 Other specified chronic obstructive pulmonary disease: Secondary | ICD-10-CM | POA: Diagnosis present

## 2012-01-03 DIAGNOSIS — R5381 Other malaise: Secondary | ICD-10-CM | POA: Diagnosis present

## 2012-01-03 DIAGNOSIS — K922 Gastrointestinal hemorrhage, unspecified: Secondary | ICD-10-CM

## 2012-01-03 DIAGNOSIS — E039 Hypothyroidism, unspecified: Secondary | ICD-10-CM | POA: Diagnosis present

## 2012-01-03 DIAGNOSIS — D649 Anemia, unspecified: Secondary | ICD-10-CM

## 2012-01-03 DIAGNOSIS — J9600 Acute respiratory failure, unspecified whether with hypoxia or hypercapnia: Secondary | ICD-10-CM

## 2012-01-03 NOTE — ED Notes (Signed)
Per EMS Pt called EMS because of weakness. Pt was last treated at [facility] on 12-07-11. EMS found the meds unfinished from the D/C  On 12-07-11. Pt reported to EMS that she does  Not take potassium .

## 2012-01-03 NOTE — ED Notes (Signed)
CBG reported by EMS 89

## 2012-01-03 NOTE — ED Provider Notes (Signed)
History     CSN: [id number]  Arrival date & time 01/03/12  2319   First MD Initiated Contact with Patient 01/03/12 2320      Chief Complaint  Patient presents with  . Weakness    (Consider location/radiation/quality/duration/timing/severity/associated sxs/prior treatment) Patient is a 78 y.o. female presenting with weakness. The history is provided by the EMS personnel and the patient. The history is limited by a developmental delay.  Weakness Primary symptoms do not include headaches, syncope, seizures, dizziness, visual change, paresthesias, focal weakness, loss of sensation, speech change, fever, nausea or vomiting. Primary symptoms comment: global weakness The symptoms began 12 to 24 hours ago. The symptoms are unchanged. The neurological symptoms are diffuse. Context: none called EMS because she could not get up from the couch.  Additional symptoms include weakness. Additional symptoms do not include neck stiffness, pain or lower back pain. Medical issues do not include recent surgery. Workup history does not include lumbar puncture.    Past Medical History  Diagnosis Date  . Abdominal pain   . Hypothyroid   . Diabetes mellitus   . COPD (chronic obstructive pulmonary disease)   . GERD (gastroesophageal reflux disease)   . Blood transfusion     Past Surgical History  Procedure Date  . Abdominal hysterectomy   . Esophagogastroduodenoscopy 05/17/2011    Procedure: ESOPHAGOGASTRODUODENOSCOPY (EGD);  Surgeon: Hart Carwin, MD;  Location: [facility] ENDOSCOPY;  Service: Endoscopy;  Laterality: N/A;    Family History  Problem Relation Age of Onset  . Stroke Mother     History  Substance Use Topics  . Smoking status: Smoker, Current Status Unknown    Types: Cigarettes  . Smokeless tobacco: Not on file  . Alcohol Use: Yes     Uknown How much, daughter thinks she drinks heavy    OB History    Grav Para Term Preterm Abortions TAB SAB Ect Mult Living                   Review of Systems  Constitutional: Negative for fever.  HENT: Negative for neck stiffness.   Respiratory: Negative for shortness of breath.   Cardiovascular: Negative for chest pain, leg swelling and syncope.  Gastrointestinal: Negative for nausea and vomiting.  Neurological: Positive for weakness. Negative for dizziness, speech change, focal weakness, seizures, numbness, headaches and paresthesias.  All other systems reviewed and are negative.    Allergies  Review of patient's allergies indicates no known allergies.  Home Medications   Current Outpatient Rx  Name Route Sig Dispense Refill  . LEVOTHYROXINE SODIUM 100 MCG PO TABS Oral Take 100 mcg by mouth daily.    Carma Leaven M PLUS PO TABS Oral Take 1 tablet by mouth daily.    Marland Kitchen POTASSIUM CHLORIDE 20 MEQ PO PACK Oral Take 20 mEq by mouth daily.       BP 169/89  Pulse 72  Temp 98.9 F (37.2 C) (Oral)  Resp 21  SpO2 99%  LMP 05/17/1983  Physical Exam  Constitutional: She appears well-developed and well-nourished. No distress.  HENT:  Head: Normocephalic and atraumatic.  Right Ear: External ear normal.  Left Ear: External ear normal.  Mouth/Throat: Oropharynx is clear and moist.  Eyes: Conjunctivae and EOM are normal. Pupils are equal, round, and reactive to light.  Neck: Normal range of motion. Neck supple.  Cardiovascular: Normal rate and regular rhythm.   Pulmonary/Chest: Effort normal. She has decreased breath sounds.  Abdominal: Soft. Bowel sounds are normal. There is no  tenderness. There is no rebound and no guarding.  Musculoskeletal: Normal range of motion. She exhibits no edema.  Neurological: She is alert. She has normal reflexes.  Skin: Skin is warm and dry.  Psychiatric: She has a normal mood and affect.    ED Course  Procedures (including critical care time)   Labs Reviewed  CBC WITH DIFFERENTIAL  URINALYSIS, ROUTINE W REFLEX MICROSCOPIC  URINE CULTURE   No results found.   No diagnosis  found.    MDM   Date: 01/03/2012  Rate: 74  Rhythm: normal sinus rhythm  QRS Axis: normal  Intervals: normal  ST/T Wave abnormalities: normal  Conduction Disutrbances: none  Narrative Interpretation: unremarkable           Telesa Jeancharles K Fawzi Melman-Rasch, MD 01/05/12 [phone]

## 2012-01-04 ENCOUNTER — Encounter (HOSPITAL_COMMUNITY): Payer: Self-pay | Admitting: *Deleted

## 2012-01-04 DIAGNOSIS — R531 Weakness: Secondary | ICD-10-CM | POA: Diagnosis present

## 2012-01-04 DIAGNOSIS — R5383 Other fatigue: Secondary | ICD-10-CM

## 2012-01-04 DIAGNOSIS — N390 Urinary tract infection, site not specified: Secondary | ICD-10-CM | POA: Diagnosis present

## 2012-01-04 DIAGNOSIS — E039 Hypothyroidism, unspecified: Secondary | ICD-10-CM

## 2012-01-04 DIAGNOSIS — R5381 Other malaise: Secondary | ICD-10-CM

## 2012-01-04 DIAGNOSIS — E119 Type 2 diabetes mellitus without complications: Secondary | ICD-10-CM

## 2012-01-04 LAB — URINALYSIS, ROUTINE W REFLEX MICROSCOPIC
Glucose, UA: NEGATIVE mg/dL
Nitrite: POSITIVE — AB
Protein, ur: NEGATIVE mg/dL
Urobilinogen, UA: 1 mg/dL (ref 0.0–1.0)

## 2012-01-04 LAB — CBC WITH DIFFERENTIAL/PLATELET
Basophils Relative: 1 % (ref 0–1)
Eosinophils Absolute: 0 10*3/uL (ref 0.0–0.7)
Lymphs Abs: 2.3 10*3/uL (ref 0.7–4.0)
MCH: 34.5 pg — ABNORMAL HIGH (ref 26.0–34.0)
Neutrophils Relative %: 44 % (ref 43–77)
Platelets: 152 10*3/uL (ref 150–400)
RBC: 3.51 MIL/uL — ABNORMAL LOW (ref 3.87–5.11)

## 2012-01-04 LAB — URINE MICROSCOPIC-ADD ON

## 2012-01-04 LAB — POCT I-STAT, CHEM 8
BUN: 14 mg/dL (ref 6–23)
Calcium, Ion: 1.1 mmol/L — ABNORMAL LOW (ref 1.13–1.30)
Chloride: 105 mEq/L (ref 96–112)
HCT: 39 % (ref 36.0–46.0)
Potassium: 3.6 mEq/L (ref 3.5–5.1)

## 2012-01-04 LAB — GLUCOSE, CAPILLARY
Glucose-Capillary: 261 mg/dL — ABNORMAL HIGH (ref 70–99)
Glucose-Capillary: 226 mg/dL — ABNORMAL HIGH (ref 70–99)

## 2012-01-04 LAB — POCT I-STAT TROPONIN I: Troponin i, poc: 0.01 ng/mL (ref 0.00–0.08)

## 2012-01-04 MED ORDER — BOOST / RESOURCE BREEZE PO LIQD
1.0000 | Freq: Three times a day (TID) | ORAL | Status: DC
  Administered 2012-01-04 – 2012-01-07 (×10): 1 via ORAL

## 2012-01-04 MED ORDER — DEXTROSE 5 % IV SOLN
1.0000 g | INTRAVENOUS | Status: DC
  Administered 2012-01-05: 1 g via INTRAVENOUS
  Filled 2012-01-04 (×2): qty 10

## 2012-01-04 MED ORDER — LEVOTHYROXINE SODIUM 100 MCG PO TABS
100.0000 ug | ORAL_TABLET | Freq: Every day | ORAL | Status: DC
  Administered 2012-01-04 – 2012-01-07 (×4): 100 ug via ORAL
  Filled 2012-01-04 (×5): qty 1

## 2012-01-04 MED ORDER — DEXTROSE 5 % IV SOLN
1.0000 g | Freq: Once | INTRAVENOUS | Status: AC
  Administered 2012-01-04: 1 g via INTRAVENOUS
  Filled 2012-01-04: qty 10

## 2012-01-04 MED ORDER — DOCUSATE SODIUM 100 MG PO CAPS
100.0000 mg | ORAL_CAPSULE | Freq: Every day | ORAL | Status: DC | PRN
  Filled 2012-01-04: qty 1

## 2012-01-04 MED ORDER — SODIUM CHLORIDE 0.9 % IV SOLN
INTRAVENOUS | Status: DC
  Administered 2012-01-04 – 2012-01-05 (×4): via INTRAVENOUS

## 2012-01-04 MED ORDER — ACETAMINOPHEN 650 MG RE SUPP: 650.0000 mg | Freq: Four times a day (QID) | RECTAL | Status: DC | PRN

## 2012-01-04 MED ORDER — ONDANSETRON HCL 4 MG/2ML IJ SOLN: 4.0000 mg | Freq: Four times a day (QID) | INTRAMUSCULAR | Status: DC | PRN

## 2012-01-04 MED ORDER — ALUM & MAG HYDROXIDE-SIMETH 200-200-20 MG/5ML PO SUSP: 30.0000 mL | Freq: Four times a day (QID) | ORAL | Status: DC | PRN

## 2012-01-04 MED ORDER — ONDANSETRON HCL 4 MG PO TABS: 4.0000 mg | ORAL_TABLET | Freq: Four times a day (QID) | ORAL | Status: DC | PRN

## 2012-01-04 MED ORDER — ENOXAPARIN SODIUM 30 MG/0.3ML ~~LOC~~ SOLN
30.0000 mg | SUBCUTANEOUS | Status: DC
  Administered 2012-01-05 – 2012-01-07 (×3): 30 mg via SUBCUTANEOUS
  Filled 2012-01-04 (×4): qty 0.3

## 2012-01-04 MED ORDER — ENOXAPARIN SODIUM 40 MG/0.4ML ~~LOC~~ SOLN
40.0000 mg | SUBCUTANEOUS | Status: DC
  Administered 2012-01-04: 40 mg via SUBCUTANEOUS
  Filled 2012-01-04 (×2): qty 0.4

## 2012-01-04 MED ORDER — POTASSIUM CHLORIDE 20 MEQ PO PACK
20.0000 meq | PACK | Freq: Every day | ORAL | Status: DC
  Filled 2012-01-04: qty 1

## 2012-01-04 MED ORDER — POTASSIUM CHLORIDE CRYS ER 20 MEQ PO TBCR
20.0000 meq | EXTENDED_RELEASE_TABLET | Freq: Every day | ORAL | Status: DC
  Administered 2012-01-04 – 2012-01-06 (×3): 20 meq via ORAL
  Filled 2012-01-04 (×3): qty 1
  Filled 2012-01-04: qty 2
  Filled 2012-01-04: qty 1

## 2012-01-04 MED ORDER — ACETAMINOPHEN 325 MG PO TABS: 650.0000 mg | ORAL_TABLET | Freq: Four times a day (QID) | ORAL | Status: DC | PRN

## 2012-01-04 MED ORDER — INSULIN ASPART 100 UNIT/ML ~~LOC~~ SOLN
0.0000 [IU] | Freq: Three times a day (TID) | SUBCUTANEOUS | Status: DC
  Administered 2012-01-04: 5 [IU] via SUBCUTANEOUS
  Administered 2012-01-05: 7 [IU] via SUBCUTANEOUS
  Administered 2012-01-05: 1 [IU] via SUBCUTANEOUS
  Administered 2012-01-06: 5 [IU] via SUBCUTANEOUS
  Administered 2012-01-07: 3 [IU] via SUBCUTANEOUS
  Administered 2012-01-07: 1 [IU] via SUBCUTANEOUS

## 2012-01-04 NOTE — Evaluation (Signed)
Physical Therapy Evaluation Patient Details Name: [name] MRN: [id number] DOB: 23-Feb-1934 Today's Date: 01/04/2012 Time: 9562-1308 PT Time Calculation (min): 28 min  PT Assessment / Plan / Recommendation Clinical Impression  Pt admitted with generalized weakness. Pt lives alone and demonstrates decreased mobility and safety awareness. Dgtr lives in [place] and comes by about once a week. Pt states she has other people who pop in on a daily basis. Do not feel pt is safe to be living alone and recommend SNF. Pt will benefit from acute therapy to maximize mobility, strength, and gait prior to discharge to decrease burden of care.     PT Assessment  Patient needs continued PT services    Follow Up Recommendations  Skilled nursing facility    Barriers to Discharge Decreased caregiver support      Equipment Recommendations  Defer to next venue    Recommendations for Other Services OT consult   Frequency Min 3X/week    Precautions / Restrictions Precautions Precautions: Fall Restrictions Weight Bearing Restrictions: No   Pertinent Vitals/Pain No pain      Mobility  Bed Mobility Bed Mobility: Supine to Sit;Sitting - Scoot to Edge of Bed Supine to Sit: 4: Min assist;HOB elevated (HOB 15degrees) Sitting - Scoot to Edge of Bed: 3: Mod assist Details for Bed Mobility Assistance: assist with pad and VC to achieve sitting and scooting to edge of surface Transfers Transfers: Sit to Stand;Stand to Sit Sit to Stand: From bed;From toilet;4: Min assist Stand to Sit: To toilet;To chair/3-in-1;4: Min assist Details for Transfer Assistance: cueing for hand placement, safety, sequence and assist to control trunk and pelvis to surface and for elevation with anterior translation Ambulation/Gait Ambulation/Gait Assistance: 4: Min assist Ambulation Distance (Feet): 60 Feet Assistive device: Rolling walker Ambulation/Gait Assistance Details: cueing throughout to step into RW, extend  trunk and mod assist to direct RW increased difficulty with turning and backing Gait Pattern: Decreased stride length;Trunk flexed;Narrow base of support;Shuffle Gait velocity: decreased    Exercises     PT Diagnosis: Difficulty walking  PT Problem List: Decreased strength;Decreased activity tolerance;Decreased balance;Decreased mobility;Decreased cognition PT Treatment Interventions: Gait training;Stair training;DME instruction;Functional mobility training;Therapeutic activities;Therapeutic exercise;Balance training;Patient/family education;Cognitive remediation   PT Goals Acute Rehab PT Goals PT Goal Formulation: With patient Time For Goal Achievement: 01/18/12 Potential to Achieve Goals: Fair Pt will go Supine/Side to Sit: with modified independence PT Goal: Supine/Side to Sit - Progress: Goal set today Pt will go Sit to Supine/Side: with modified independence PT Goal: Sit to Supine/Side - Progress: Goal set today Pt will go Sit to Stand: with supervision PT Goal: Sit to Stand - Progress: Goal set today Pt will go Stand to Sit: with supervision PT Goal: Stand to Sit - Progress: Goal set today Pt will Ambulate: >150 feet;with supervision;with least restrictive assistive device PT Goal: Ambulate - Progress: Goal set today Pt will Go Up / Down Stairs: 1-2 stairs;with rail(s);with supervision PT Goal: Up/Down Stairs - Progress: Goal set today  Visit Information  Last PT Received On: 01/04/12 Assistance Needed: +1    Subjective Data  Subjective: I have a good support system Patient Stated Goal: go home   Prior Functioning  Home Living Lives With: Alone Available Help at Discharge: Family;Available PRN/intermittently Type of Home: House Home Access: Stairs to enter [employer] of Steps: 1 Entrance Stairs-Rails: Can reach both;Left;Right Home Layout: One level Bathroom Shower/Tub: Engineer, manufacturing systems: Standard Home Adaptive Equipment: Straight  cane;Quad cane;Walker - rolling;Walker - standard;Wheelchair -  manual;Bedside commode/3-in-1 Prior Function Level of Independence: Needs assistance Needs Assistance: Light Housekeeping;Meal Prep;Bathing Bath: Minimal Meal Prep: Total Light Housekeeping: Total Able to Take Stairs?: Yes Driving: Yes (drives to grocery store) Vocation: Retired Comments: mobile meals and frozen dinners, pt doesn't eat breakfast Communication Communication: No difficulties    Cognition  Overall Cognitive Status: Impaired Area of Impairment: Safety/judgement Arousal/Alertness: Awake/alert Orientation Level: Appears intact for tasks assessed Behavior During Session: [facility] for tasks performed Safety/Judgement: Decreased awareness of need for assistance Cognition - Other Comments: Pt with constant report of fear of falling even when sitting in chair.     Extremity/Trunk Assessment Right Lower Extremity Assessment RLE ROM/Strength/Tone: Deficits RLE ROM/Strength/Tone Deficits: 3/5  all hip and knee myotomes RLE Sensation: WFL - Light Touch RLE Coordination: WFL - gross/fine motor Left Lower Extremity Assessment LLE ROM/Strength/Tone: Deficits LLE ROM/Strength/Tone Deficits: 3/5 all hip and knee myotomes LLE Sensation: WFL - Light Touch LLE Coordination: WFL - gross/fine motor   Balance    End of Session PT - End of Session Equipment Utilized During Treatment: Gait belt Activity Tolerance: Patient tolerated treatment well Patient left: in chair;with call bell/phone within reach  GP     Delorse Lek 01/04/2012, 10:16 AM  Delaney Meigs, PT [phone]

## 2012-01-04 NOTE — Progress Notes (Signed)
INITIAL ADULT NUTRITION ASSESSMENT Date: 01/04/2012   Time: 12:22 PM Reason for Assessment: Health History  INTERVENTION: 1. Resource Breeze po TID, each supplement provides 250 kcal and 9 grams of protein. 2. Encouraged intake of fluids with each meals and between meals  3. RD will continue to follow    ASSESSMENT: Female 78 y.o.  Dx: UTI (lower urinary tract infection)  Hx:  Past Medical History  Diagnosis Date  . Abdominal pain   . Hypothyroid   . Diabetes mellitus   . COPD (chronic obstructive pulmonary disease)   . GERD (gastroesophageal reflux disease)   . Blood transfusion     Related Meds:     . cefTRIAXone (ROCEPHIN)  IV  1 g Intravenous Once  . cefTRIAXone (ROCEPHIN)  IV  1 g Intravenous Q24H  . enoxaparin (LOVENOX) injection  40 mg Subcutaneous Q24H  . levothyroxine  100 mcg Oral Daily  . potassium chloride  20 mEq Oral Daily  . DISCONTD: potassium chloride  20 mEq Oral Daily     Ht: 4' 10.8" (149.4 cm)  Wt: 80 lb 1.6 oz (36.333 kg)  Ideal Wt: 44.7 kg  % Ideal Wt: 81%  Usual Wt: 95 lbs per pt report  Wt Readings from Last 5 Encounters:  01/04/12 80 lb 1.6 oz (36.333 kg)  06/04/11 113 lb 14.4 oz (51.665 kg)  06/04/11 113 lb 14.4 oz (51.665 kg)    % Usual Wt: 84%  Body mass index is 16.29 kg/(m^2). Pt meets criteria or underweight   Food/Nutrition Related Hx: Pt reports weight loss  Labs:  CMP     Component Value Date/Time   NA 143 01/04/2012 0054   K 3.6 01/04/2012 0054   CL 105 01/04/2012 0054   CO2 22 12/07/2011 1412   GLUCOSE 78 01/04/2012 0054   BUN 14 01/04/2012 0054   CREATININE 0.46* 01/04/2012 0630   CALCIUM 8.7 12/07/2011 1412   PROT 7.5 12/07/2011 1412   ALBUMIN 4.1 12/07/2011 1412   AST 203* 12/07/2011 1412   ALT 54* 12/07/2011 1412   ALKPHOS 108 12/07/2011 1412   BILITOT 1.0 12/07/2011 1412   GFRNONAA >90 01/04/2012 0630   GFRAA >90 01/04/2012 0630     Intake/Output Summary (Last 24 hours) at 01/04/12 1223 Last data filed at  01/04/12 0900  Gross per 24 hour  Intake    240 ml  Output      0 ml  Net    240 ml     Diet Order: Carb Control PO intake : 100% x 1 meal   Supplements/Tube Feeding: none  IVF:    sodium chloride Last Rate: 100 mL/hr at 01/04/12 0357    Estimated Nutritional Needs:   Kcal: 1300-1500 Protein: 55-65 gm  Fluid:  > 1.5 L  Pt is underweight, appears to have 33 lb (29%) weight loss in about 7 months, severe weight loss.  Pt reports that she was 97 lbs "just a few days ago" at her pcp's office. States that she has been sleeping more during the day and eating usually one meal and a snack. Pt has been drinking water/beverages, but knows she needs to drink more. Pt dehydrated on admission, likely some weight loss r/t fluids. Pt is willing to drink Resource Breeze BID for increased protein and kcals to maintain weight.   Pt meets criteria for severe malnutrition in the context of social/environmental circumstances 2/2 weight loss of 29% in less then one year and </=50% intake for >1 month  NUTRITION  DIAGNOSIS: -Inadequate oral intake (NI-2.1).  Status: Ongoing  RELATED TO: poor appetite, sleeping through meals   AS EVIDENCE BY: weight loss, underweight   MONITORING/EVALUATION(Goals): Goal: PO intake of meals and supplements to provide >90% of estimated nutrition needs  Monitor: PO intake, weight, labs, I/O's  EDUCATION NEEDS: -No education needs identified at this time   DOCUMENTATION CODES Per approved criteria  -Severe  malnutrition in the context of social or environmental circumstances -Underweight   Clarene Duke RD, LDN Pager [phone] After Hours pager [phone]

## 2012-01-04 NOTE — Progress Notes (Signed)
Patient oriented to unit, call bell, and safety measures.  She acknowledges understanding and requests all side rails to be up on her bed.  She refused to watch the safety video.  From home alone.  POC: IV antibiotic administration for UTI.  Will continue to monitor patient.

## 2012-01-04 NOTE — ED Notes (Signed)
[phone]:  Sue Lush (Daughter) [phone] Trixie Rude (Son-in-law)

## 2012-01-04 NOTE — Care Management Note (Signed)
    Page 1 of 1   01/07/2012     3:28:34 PM   CARE MANAGEMENT NOTE 01/07/2012  Patient:  [name], [name]   Account Number:  [id number]  Date Initiated:  01/04/2012  Documentation initiated by:  Letha Cape  Subjective/Objective Assessment:   dx uti  admit- lives alone.     Action/Plan:   pt eval - rec snf   Anticipated DC Date:  01/07/2012   Anticipated DC Plan:  SKILLED NURSING FACILITY  In-house referral  Clinical Social Worker      DC Planning Services  CM consult      Choice offered to / List presented to:             Status of service:  Completed, signed off Medicare Important Message given?   (If response is "NO", the following Medicare IM given date fields will be blank) Date Medicare IM given:   Date Additional Medicare IM given:    Discharge Disposition:  SKILLED NURSING FACILITY  Per UR Regulation:  Reviewed for med. necessity/level of care/duration of stay  If discussed at Long Length of Stay Meetings, dates discussed:    Comments:  01/07/12 15;26 Letha Cape RN, BSN [phone] patient for dc to snf today to [facility] in [place].  01/04/12 17:09 Letha Cape RN, BSN [phone] physical therapy is rec snf, CSW referral.  Per CSW pt is agreeable to snf.

## 2012-01-04 NOTE — Progress Notes (Signed)
Subjective: No specific concerns.  Denies any pain.  Was eating her breakfast this morning.  Objective: Vital signs in last 24 hours: Filed Vitals:   01/04/12 0127 01/04/12 0302 01/04/12 0457 01/04/12 0554  BP: 176/95 175/87 156/76 166/81  Pulse: 69 76 92 79  Temp:    98.2 F (36.8 C)  TempSrc:    Oral  Resp: 14 16 15 16   Height:    4' 10.8" (1.494 m)  Weight:    36.333 kg (80 lb 1.6 oz)  SpO2: 97% 100% 97% 97%   Weight change:  No intake or output data in the 24 hours ending 01/04/12 0844  Physical Exam: General: Awake, Oriented, No acute distress. HEENT: EOMI. Neck: Supple CV: S1 and S2 Lungs: Clear to ascultation bilaterally Abdomen: Soft, Nontender, Nondistended, +bowel sounds. Ext: Good pulses. Trace edema.  Lab Results: Basic Metabolic Panel:  Lab 01/04/12 9604 01/04/12 0054  NA -- 143  K -- 3.6  CL -- 105  CO2 -- --  GLUCOSE -- 78  BUN -- 14  CREATININE 0.46* 0.70  CALCIUM -- --  MG -- --  PHOS -- --   Liver Function Tests: No results found for this basename: AST:5,ALT:5,ALKPHOS:5,BILITOT:5,PROT:5,ALBUMIN:5 in the last 168 hours No results found for this basename: LIPASE:5,AMYLASE:5 in the last 168 hours No results found for this basename: AMMONIA:5 in the last 168 hours CBC:  Lab 01/04/12 0054 01/04/12 0015  WBC -- 5.1  NEUTROABS -- 2.3  HGB 13.3 12.1  HCT 39.0 35.5*  MCV -- 101.1*  PLT -- 152   Cardiac Enzymes: No results found for this basename: CKTOTAL:5,CKMB:5,CKMBINDEX:5,TROPONINI:5 in the last 168 hours BNP (last 3 results) No results found for this basename: PROBNP:3 in the last 8760 hours CBG: No results found for this basename: GLUCAP:5 in the last 168 hours No results found for this basename: HGBA1C:5 in the last 72 hours Other Labs: No components found with this basename: POCBNP:3 No results found for this basename: DDIMER:2 in the last 168 hours No results found for this basename:  CHOL:2,HDL:2,LDLCALC:2,TRIG:2,CHOLHDL:2,LDLDIRECT:2 in the last 168 hours No results found for this basename: TSH,T4TOTAL,FREET3,T3FREE,FREET4,THYROIDAB in the last 168 hours No results found for this basename: VITAMINB12:2,FOLATE:2,FERRITIN:2,TIBC:2,IRON:2,RETICCTPCT:2 in the last 168 hours  Micro Results: No results found for this or any previous visit (from the past 240 hour(s)).  Studies/Results: Dg Chest 2 View  01/04/2012  *RADIOLOGY REPORT*  Clinical Data: Weakness, vomiting, cough, shortness of breath.  CHEST - 2 VIEW  Comparison: 05/25/2011  Findings: Cardiac enlargement with normal pulmonary vascularity. Emphysematous changes and scattered fibrosis in the lungs. Prominent nipple shadows.  No focal airspace consolidation.  No blunting of costophrenic angles.  No pneumothorax.  Calcified and tortuous aorta.  The pleural effusions have resolved since previous study.  IMPRESSION: Cardiac enlargement without vascular congestion or edema. Emphysematous changes and scattered fibrosis.  No focal consolidation.  Original Report Authenticated By: Marlon Pel, M.D.   Ct Head Wo Contrast  01/04/2012  *RADIOLOGY REPORT*  Clinical Data: Fall, palpable abnormality over the right forehead per patient  CT HEAD WITHOUT CONTRAST  Technique:  Contiguous axial images were obtained from the base of the skull through the vertex without contrast.  Comparison: 12/07/2011  Findings: There is only minimal stranding at the high left occipital lobe overlying scalp region in the area of previously seen scalp hematoma.  Left supraorbital scalp swelling/stranding is stable allowing for differences in technique.  Orbits and paranasal sinuses are unremarkable. Cortical volume loss noted with  proportional ventricular prominence.  Periventricular white matter hypodensity likely indicates small vessel ischemic change.  No acute hemorrhage, acute infarction, or mass lesion is identified.  IMPRESSION: No new acute  intracranial finding.  Chronic findings as above.  Near interval resolution of previously seen left occipital scalp hematoma.  Original Report Authenticated By: Harrel Lemon, M.D.    Medications: I have reviewed the patient's current medications. Scheduled Meds:   . cefTRIAXone (ROCEPHIN)  IV  1 g Intravenous Once  . cefTRIAXone (ROCEPHIN)  IV  1 g Intravenous Q24H  . enoxaparin (LOVENOX) injection  40 mg Subcutaneous Q24H  . levothyroxine  100 mcg Oral Daily  . potassium chloride  20 mEq Oral Daily  . DISCONTD: potassium chloride  20 mEq Oral Daily   Continuous Infusions:   . sodium chloride 100 mL/hr at 01/04/12 0357   PRN Meds:.acetaminophen, acetaminophen, alum & mag hydroxide-simeth, docusate sodium, ondansetron (ZOFRAN) IV, ondansetron  Assessment/Plan: Urinary tract infection Outpatient treatment failure.  Urine culture sent and pending.  Continue ceftriaxone, antibiotic since 01/04/2012.   Generalized Weakness Etiology unclear. Likely due urine tract infection.  PT recommending skilled nursing facility.  Diabetes SSI. Stable.   Hypothyroidism Continue levothyroxin   CODE STATUS Full code   Prophylaxis Lovenox   LOS: 1 day  Skyllar Notarianni A, MD 01/04/2012, 8:44 AM

## 2012-01-04 NOTE — H&P (Addendum)
PCP:   Elby Showers, MD   Chief Complaint:  Weakness  HPI: This is a 78 year old female with a history of diabetes, hypothyroidism, COPD, gastroesophageal reflux disease who presents to the emergency department with complaints of weakness that began earlier today. She was cleaned with a and was not able to get up from the couch. She called EMS who brought her to the hospital. The weakness is global and is not localized to one side. She denies headache, syncope, focal weakness, slurred speech, fever, chills. She was recently diagnosed with a UTI and was treated with ciprofloxacin. She has not taken this as prescribed -instead of twice a day, she only takes once a day. She does deny dysuria, tenderness of the bladder. The frequency and urgency.  Review of Systems:  The patient denies anorexia, fever, weight loss,, vision loss, decreased hearing, hoarseness, chest pain, syncope, dyspnea on exertion, peripheral edema, balance deficits, hemoptysis, abdominal pain, melena, hematochezia, severe indigestion/heartburn, hematuria, incontinence, genital sores, muscle weakness, suspicious skin lesions, transient blindness, difficulty walking, depression, unusual weight change, abnormal bleeding, enlarged lymph nodes, angioedema, and breast masses.  Past Medical History: Past Medical History  Diagnosis Date  . Abdominal pain   . Hypothyroid   . Diabetes mellitus   . COPD (chronic obstructive pulmonary disease)   . GERD (gastroesophageal reflux disease)   . Blood transfusion    Past Surgical History  Procedure Date  . Abdominal hysterectomy   . Esophagogastroduodenoscopy 05/17/2011    Procedure: ESOPHAGOGASTRODUODENOSCOPY (EGD);  Surgeon: Hart Carwin, MD;  Location: [facility] ENDOSCOPY;  Service: Endoscopy;  Laterality: N/A;    Medications: Prior to Admission medications   Medication Sig Start Date End Date Taking? Authorizing Provider  levothyroxine (SYNTHROID, LEVOTHROID) 100 MCG tablet Take 100  mcg by mouth daily.   Yes Historical Provider, MD  potassium chloride (KLOR-CON) 20 MEQ packet Take 20 mEq by mouth daily.    Yes Historical Provider, MD  PRESCRIPTION MEDICATION Take 1 tablet by mouth 2 (two) times daily. Pt takes Cipro but, can't remember the dosage.   Yes Historical Provider, MD    Allergies:  No Known Allergies  Social History:  reports that she has been smoking Cigarettes.  She does not have any smokeless tobacco history on file. She reports that she drinks alcohol. She reports that she does not use illicit drugs.  Family History: Family History  Problem Relation Age of Onset  . Stroke Mother     Physical Exam: Filed Vitals:   01/03/12 2324 01/04/12 0127 01/04/12 0302  BP: 169/89 176/95 175/87  Pulse: 72 69 76  Temp: 98.9 F (37.2 C)    TempSrc: Oral    Resp: 21 14 16   SpO2: 99% 97% 100%   General appearance: alert, cooperative and no distress Head: Normocephalic, without obvious abnormality, atraumatic Eyes: conjunctivae/corneas clear. PERRL, EOM's intact. Fundi benign. Ears: normal TM's and external ear canals both ears Neck: no adenopathy, no carotid bruit, no JVD, supple, symmetrical, trachea midline and thyroid not enlarged, symmetric, no tenderness/mass/nodules Resp: clear to auscultation bilaterally Cardio: regular rate and rhythm, S1, S2 normal, no murmur, click, rub or gallop GI: soft, non-tender; bowel sounds normal; no masses,  no organomegaly Extremities: extremities normal, atraumatic, no cyanosis or edema Pulses: 2+ and symmetric Skin: Skin color, texture, turgor normal. No rashes or lesions Neurologic: Alert and oriented X 3, normal strength and tone. Normal symmetric reflexes. Normal coordination and gait   Labs on Admission:   [facility] 01/04/12 0054  NA 143  K 3.6  CL 105  CO2 --  GLUCOSE 78  BUN 14  CREATININE 0.70  CALCIUM --  MG --  PHOS --   No results found for this basename:  AST:2,ALT:2,ALKPHOS:2,BILITOT:2,PROT:2,ALBUMIN:2 in the last 72 hours No results found for this basename: LIPASE:2,AMYLASE:2 in the last 72 hours  Basename 01/04/12 0054 01/04/12 0015  WBC -- 5.1  NEUTROABS -- 2.3  HGB 13.3 12.1  HCT 39.0 35.5*  MCV -- 101.1*  PLT -- 152   No results found for this basename: CKTOTAL:3,CKMB:3,CKMBINDEX:3,TROPONINI:3 in the last 72 hours No results found for this basename: TSH,T4TOTAL,FREET3,T3FREE,THYROIDAB in the last 72 hours No results found for this basename: VITAMINB12:2,FOLATE:2,FERRITIN:2,TIBC:2,IRON:2,RETICCTPCT:2 in the last 72 hours  Radiological Exams on Admission: Dg Chest 2 View  01/04/2012  *RADIOLOGY REPORT*  Clinical Data: Weakness, vomiting, cough, shortness of breath.  CHEST - 2 VIEW  Comparison: 05/25/2011  Findings: Cardiac enlargement with normal pulmonary vascularity. Emphysematous changes and scattered fibrosis in the lungs. Prominent nipple shadows.  No focal airspace consolidation.  No blunting of costophrenic angles.  No pneumothorax.  Calcified and tortuous aorta.  The pleural effusions have resolved since previous study.  IMPRESSION: Cardiac enlargement without vascular congestion or edema. Emphysematous changes and scattered fibrosis.  No focal consolidation.  Original Report Authenticated By: Marlon Pel, M.D.   Ct Head Wo Contrast  01/04/2012  *RADIOLOGY REPORT*  Clinical Data: Fall, palpable abnormality over the right forehead per patient  CT HEAD WITHOUT CONTRAST  Technique:  Contiguous axial images were obtained from the base of the skull through the vertex without contrast.  Comparison: 12/07/2011  Findings: There is only minimal stranding at the high left occipital lobe overlying scalp region in the area of previously seen scalp hematoma.  Left supraorbital scalp swelling/stranding is stable allowing for differences in technique.  Orbits and paranasal sinuses are unremarkable. Cortical volume loss noted with proportional  ventricular prominence.  Periventricular white matter hypodensity likely indicates small vessel ischemic change.  No acute hemorrhage, acute infarction, or mass lesion is identified.  IMPRESSION: No new acute intracranial finding.  Chronic findings as above.  Near interval resolution of previously seen left occipital scalp hematoma.  Original Report Authenticated By: Harrel Lemon, M.D.   Ct Head Wo Contrast  12/07/2011  *RADIOLOGY REPORT*  Clinical Data: Fall.  CT HEAD WITHOUT CONTRAST  Technique:  Contiguous axial images were obtained from the base of the skull through the vertex without contrast.  Comparison: Brain MRI 11/08/2010.  Head CT scan 02/07/2010.  Findings: There is cortical atrophy and chronic microvascular ischemic change.  No evidence of acute intracranial abnormality including infarction, hemorrhage, mass lesion, mass effect, midline shift or abnormal extra-axial fluid collection.  Scalp hematoma to the left of midline posteriorly is noted.  No underlying fracture.  IMPRESSION:  1.  Scalp hematoma without underlying fracture or acute intracranial abnormality. 2.  Atrophy and chronic microvascular ischemic change.  Original Report Authenticated By: Bernadene Bell. Maricela Curet, M.D.    Assessment/Plan Present on Admission:  .Weakness .UTI (lower urinary tract infection)  #1 urinary tract infection. Outpatient treatment failure. Limits patient to medical floor and continue the patient ceftriaxone. Urine cultures were sent.  #2 weakness. Likely secondary to #1. PT consult. There may be some chronic weakness in addition. I will obtain a social work consult in regards to SNF or assisted-living placement  #3 diabetes: CBGs before meals and at bedtime.  #4 hypothyroidism: Continue levothyroxin  #5 CODE STATUS: Patient is a full code  #  6 DVT prophylaxis: Lovenox  Bryson Palen JEHIEL 01/04/2012, 3:38 AM

## 2012-01-04 NOTE — Clinical Social Work Psychosocial (Signed)
     Clinical Social Work Department BRIEF PSYCHOSOCIAL ASSESSMENT 01/04/2012  Patient:  [name], [name]     Account Number:  [id number]     Admit date:  01/03/2012  Clinical Social Worker:  Jacelyn Grip  Date/Time:  01/04/2012 04:00 PM  Referred by:  Physician  Date Referred:  01/04/2012 Referred for  SNF Placement   Other Referral:   Interview type:  Patient Other interview type:   patient daughter via telephone    PSYCHOSOCIAL DATA Living Status:  ALONE Admitted from facility:   Level of care:   Primary support name:  Sue Lush Weathers/[phone] Primary support relationship to patient:  CHILD, ADULT Degree of support available:   adequate    CURRENT CONCERNS Current Concerns  Post-Acute Placement   Other Concerns:    SOCIAL WORK ASSESSMENT / PLAN CSW received notification from [facility] that PT recommending ST-SNF placement. CSW met with pt at bedside. Pt friends present at this time and pt agreeable to CSW discussing discharge planning in the presence of pt friends. CSW discussed recommendation for ST-SNF at discharge before returning home. Pt discussed that she had been in a rehab facility in [place] recently, but would like to go to rehab facility in [facility] at Discharge. CSW discussed process of SNF placement and provided [facility] list. Pt agreeable to contacting pt daughter in [place] to discuss. CSW contacted pt daughter and pt son-in-law stated that pt daughter unavailable at this time. CSW provided contact information to pt son-in-law for pt daughter to contact when available. CSW completed FL2 and initiated SNF search to [facility]. CSW to follow up with pt in regard to bed offers. CSW to facilitate pt discharge needs when pt medically stable for discharge.   Assessment/plan status:  Psychosocial Support/Ongoing Assessment of Needs Other assessment/ plan:   discharge planning   Information/referral to community resources:     [facility] list    PATIENTS/FAMILYS RESPONSE TO PLAN OF CARE: Pt alert and oriented. Pt reports that she lives alone and recognizes the need for rehab at SNF prior to returning home. CSW attempted to contact pt daughter to discuss, but pt daughter unavailable at this time.

## 2012-01-05 DIAGNOSIS — R5381 Other malaise: Secondary | ICD-10-CM

## 2012-01-05 DIAGNOSIS — E119 Type 2 diabetes mellitus without complications: Secondary | ICD-10-CM

## 2012-01-05 DIAGNOSIS — E039 Hypothyroidism, unspecified: Secondary | ICD-10-CM

## 2012-01-05 DIAGNOSIS — N390 Urinary tract infection, site not specified: Principal | ICD-10-CM

## 2012-01-05 LAB — BASIC METABOLIC PANEL
BUN: 7 mg/dL (ref 6–23)
CO2: 23 mEq/L (ref 19–32)
Chloride: 103 mEq/L (ref 96–112)
Creatinine, Ser: 0.4 mg/dL — ABNORMAL LOW (ref 0.50–1.10)

## 2012-01-05 LAB — URINE CULTURE

## 2012-01-05 LAB — GLUCOSE, CAPILLARY
Glucose-Capillary: 114 mg/dL — ABNORMAL HIGH (ref 70–99)
Glucose-Capillary: 134 mg/dL — ABNORMAL HIGH (ref 70–99)
Glucose-Capillary: 320 mg/dL — ABNORMAL HIGH (ref 70–99)

## 2012-01-05 LAB — CBC
HCT: 31.1 % — ABNORMAL LOW (ref 36.0–46.0)
MCV: 98.7 fL (ref 78.0–100.0)
RBC: 3.15 MIL/uL — ABNORMAL LOW (ref 3.87–5.11)
WBC: 5.5 10*3/uL (ref 4.0–10.5)

## 2012-01-05 MED ORDER — LORAZEPAM 1 MG PO TABS
1.0000 mg | ORAL_TABLET | Freq: Four times a day (QID) | ORAL | Status: DC | PRN
  Administered 2012-01-06: 1 mg via ORAL
  Filled 2012-01-05: qty 1

## 2012-01-05 MED ORDER — LORAZEPAM 2 MG/ML IJ SOLN
1.0000 mg | Freq: Four times a day (QID) | INTRAMUSCULAR | Status: DC | PRN
  Administered 2012-01-05 – 2012-01-06 (×2): 1 mg via INTRAVENOUS
  Filled 2012-01-05 (×2): qty 1

## 2012-01-05 MED ORDER — POTASSIUM CHLORIDE CRYS ER 20 MEQ PO TBCR
40.0000 meq | EXTENDED_RELEASE_TABLET | Freq: Once | ORAL | Status: AC
  Administered 2012-01-05: 40 meq via ORAL
  Filled 2012-01-05: qty 2

## 2012-01-05 MED ORDER — FOLIC ACID 1 MG PO TABS
1.0000 mg | ORAL_TABLET | Freq: Every day | ORAL | Status: DC
  Administered 2012-01-05 – 2012-01-07 (×3): 1 mg via ORAL
  Filled 2012-01-05 (×3): qty 1

## 2012-01-05 MED ORDER — ADULT MULTIVITAMIN W/MINERALS CH
1.0000 | ORAL_TABLET | Freq: Every day | ORAL | Status: DC
  Administered 2012-01-05 – 2012-01-07 (×3): 1 via ORAL
  Filled 2012-01-05 (×3): qty 1

## 2012-01-05 MED ORDER — VITAMIN B-1 100 MG PO TABS
100.0000 mg | ORAL_TABLET | Freq: Every day | ORAL | Status: DC
  Administered 2012-01-05 – 2012-01-07 (×3): 100 mg via ORAL
  Filled 2012-01-05 (×3): qty 1

## 2012-01-05 MED ORDER — THIAMINE HCL 100 MG/ML IJ SOLN
100.0000 mg | Freq: Every day | INTRAMUSCULAR | Status: DC
  Filled 2012-01-05 (×2): qty 1

## 2012-01-05 MED ORDER — CEFTRIAXONE SODIUM 1 G IJ SOLR
1.0000 g | INTRAMUSCULAR | Status: AC
  Administered 2012-01-06: 1 g via INTRAVENOUS
  Filled 2012-01-05 (×2): qty 10

## 2012-01-05 NOTE — Progress Notes (Signed)
Pt is very anxious and confused. Pt refuses to stay in bed. PT bp was 174/90. Pt was given 1 mg of ativan per physician's order. RN will continue to monitor pt.

## 2012-01-05 NOTE — Progress Notes (Signed)
Physical Therapy Treatment Patient Details Name: [name] MRN: [id number] DOB: 26-Mar-1934 Today's Date: 01/05/2012 Time: 4098-1191 PT Time Calculation (min): 28 min  PT Assessment / Plan / Recommendation Comments on Treatment Session  Pt admitted with generalized weakness and able to progress mobility today although continues to need assist for all transfers. Pt ambulated from bed to bathroom door at which time she became incontinent of bowel and bladder with assist for lower body dressing and bathing. Pt unable to sit at toilet without one hand on rail at all times due to balance deficits. Pt states she is still willing to consider SNF but that dgtr wants her to go back to facility in [facility]. Will continue to follow. nurse tech aware of mobility and pt in recliner with secretary notified for use of camera due to lack of chair alarm.     Follow Up Recommendations       Barriers to Discharge        Equipment Recommendations       Recommendations for Other Services    Frequency     Plan Discharge plan remains appropriate;Frequency remains appropriate    Precautions / Restrictions Precautions Precautions: Fall   Pertinent Vitals/Pain No pain    Mobility  Bed Mobility Bed Mobility: Supine to Sit Supine to Sit: 4: Min assist;HOB flat;With rails Sitting - Scoot to Edge of Bed: 5: Supervision Details for Bed Mobility Assistance: Pt unable to complete supine to sit without assist even with cueing to decrease energy expenditure with transfer Transfers Transfers: Sit to Stand;Stand to Sit Sit to Stand: 4: Min guard;From bed;From toilet Stand to Sit: 4: Min guard;To toilet;To chair/3-in-1 Details for Transfer Assistance: cueing for safety, hand placement, sequence Ambulation/Gait Ambulation/Gait Assistance: 4: Min assist Ambulation Distance (Feet): 180 Feet Assistive device: Rolling walker Ambulation/Gait Assistance Details: cueing to step into RW and for safety with  directing RW. assist for turning and balance with ambulation. cueing x 3 to avoid running into wall on left Gait Pattern: Step-through pattern;Trunk flexed;Decreased stride length Gait velocity: decreased    Exercises     PT Diagnosis:    PT Problem List:   PT Treatment Interventions:     PT Goals Acute Rehab PT Goals PT Goal: Supine/Side to Sit - Progress: Progressing toward goal PT Goal: Sit to Stand - Progress: Progressing toward goal PT Goal: Stand to Sit - Progress: Progressing toward goal PT Goal: Ambulate - Progress: Progressing toward goal  Visit Information  Last PT Received On: 01/05/12 Assistance Needed: +1    Subjective Data  Subjective: I think I can manage at home   Cognition  Overall Cognitive Status: Impaired Area of Impairment: Safety/judgement Safety/Judgement: Decreased awareness of need for assistance;Decreased safety judgement for tasks assessed    Balance  Static Sitting Balance Static Sitting - Balance Support: Left upper extremity supported;Feet supported Static Sitting - Level of Assistance: 5: Stand by assistance Static Sitting - Comment/# of Minutes: 7  End of Session PT - End of Session Equipment Utilized During Treatment: Gait belt Activity Tolerance: Patient tolerated treatment well Patient left: in chair;with call bell/phone within reach;with family/visitor present Nurse Communication: Mobility status   GP     Delorse Lek 01/05/2012, 1:39 PM Delaney Meigs, PT [phone]

## 2012-01-05 NOTE — Progress Notes (Addendum)
Clinical Social Work Department CLINICAL SOCIAL WORK PLACEMENT NOTE 01/05/2012  Patient:  GILLIE, FLEITES  Account Number:  [id number] Admit date:  01/03/2012  Clinical Social Worker:  Jacelyn Grip  Date/time:  01/04/2012 04:30 PM  Clinical Social Work is seeking post-discharge placement for this patient at the following level of care:   SKILLED NURSING   (*CSW will update this form in Epic as items are completed)   01/04/2012  Patient/family provided with [facility] Department of Clinical Social Work's list of facilities offering this level of care within the geographic area requested by the patient (or if unable, by the patient's family).  01/04/2012  Patient/family informed of their freedom to choose among providers that offer the needed level of care, that participate in Medicare, Medicaid or managed care program needed by the patient, have an available bed and are willing to accept the patient.  01/04/2012  Patient/family informed of [facility]' ownership interest in [facility], as well as of the fact that they are under no obligation to receive care at this facility.  PASARR submitted to EDS on 06/04/2011 PASARR number received from [employer] on 06/04/2011  FL2 transmitted to all facilities in geographic area requested by pt/family on  01/04/2012 FL2 transmitted to all facilities within larger geographic area on   Patient informed that his/her managed care company has contracts with or will negotiate with  certain facilities, including the following:     Patient/family informed of bed offers received: 01/05/2012  Patient chooses bed at [facility] Physician recommends and patient chooses bed at    Patient to be transferred to  on [facility] [place] on 01/07/2012 Patient to be transferred to facility by pt daughter via car  The following physician request were entered in Epic:   Additional Comments:   Jacklynn Lewis, MSW, LCSWA  Clinical Social Work [phone]

## 2012-01-05 NOTE — Progress Notes (Signed)
Inpatient Diabetes Program Recommendations  AACE/ADA: New Consensus Statement on Inpatient Glycemic Control (2009)  Target Ranges:  Prepandial:   less than 140 mg/dL      Peak postprandial:   less than 180 mg/dL (1-2 hours)      Critically ill patients:  140 - 180 mg/dL   Reason for Visit: Post-prandial hyperglycemia  Inpatient Diabetes Program Recommendations Insulin - Meal Coverage: Please add 3 units novolog meal coverage primarily to lunch and dinner when cbg's ten to elevate above 200 mg/dL.  Note: Pt does not eat as much breakfast to need meal coverage at this time, however appears to need it for lunch and dinner. Thank you, Lenor Coffin, RN, CNS, Diabetes Coordinator [phone])

## 2012-01-05 NOTE — Progress Notes (Signed)
Patient with periods of confusing. Patient is one person assist out of bed and is high risk for fall. Patient lives alone and could benefit from rehab.

## 2012-01-05 NOTE — Progress Notes (Signed)
Clinical Social Worker continuing to follow for discharge planning. Clinical Social Worker had voice message from pt daughter this morning and returned pt daughter phone call. Clinical Social Worker updated pt daughter in regard to recommendation for discharge plan and pt daughter stated that she had spoken with pt and pt was interested in returning to [facility]. Clinical Social Worker discussed that this Visual merchandiser would follow up with pt in regard to this as pt stated yesterday that she was interested in facilities in [place]. Pt daughter inquired about if pt social security number was included in information sent to [facility]. Clinical Social Worker discussed that pt social security number is part of pt medical record and used for facilities to look at insurance information. Pt daughter expressed understanding, but requested social security number not be shared again with facilities until facility decided upon, but agreeable to [facility] having information if pt agreeable to referral.   Clinical Social Worker met with pt at bedside to discuss plan. Pt sister present at this time and pt provided permission to discuss in front of pt sister. Pt resistant to recommendation of SNF at this time and states that she wants to return home and then discuss with her daughter when her daughter returns from vacation. Clinical Child psychotherapist discussed with pt that pt daughter is hopeful that pt will go to rehab at [facility] and interested in pt returning to [facility]. Pt became frustrated and started to sit up in bed stating that she wanted her clothing as the MD was sending her home. Clinical Social Worker called for RN for assistance and clarified to pt that pt has not yet been discharged from the hospital. Pt daughter called pt room during this time and pt daughter spoke with pt in regard to pt hesitation about SNF. Clinical Child psychotherapist discussed with pt about  [facility] in [place] as an option and pt agreeable and states that she is interested in private room. Clinical Social Worker contacted facility and sent referral via fax. Clinical Social Worker received return phone call from [facility] stating that facility is able to offer a bed and a private room. Clinical Social Worker notified pt daughter and discussed with pt. Pt daughter states that they can arrange to be here at hospital to provide transportation or arrange for family member to provide transportation. Pt made aware of this. Pt is agreeable to [facility] at discharge for short term rehab. Clinical Social Worker notified facility and bed available 01/06/2012. Clinical Social Worker to contact pt daughter in the AM in regard to transportation for pt at discharge. Clinical Social Worker to facilitate pt discharge needs on 01/06/2012. Clinical Social Worker notified MD.   Jacklynn Lewis, MSW, LCSWA  Clinical Social Work [phone]

## 2012-01-05 NOTE — Progress Notes (Addendum)
Subjective: No specific concerns.  She feels better today. Initially she stated that she was feeling better and wanted to go home, however after discussion with the patient and PT recommendations yesterday of SNF, she indicated she will discuss with physical therapy and family about SNF versus going home (which I recommended against) with home healthy PT.   Objective: Vital signs in last 24 hours: Filed Vitals:   01/04/12 1300 01/04/12 2107 01/05/12 0527 01/05/12 0610  BP: 160/76 162/75 185/75 159/83  Pulse: 73 72 65 66  Temp: 98.6 F (37 C) 99 F (37.2 C) 98.5 F (36.9 C)   TempSrc: Oral Oral Oral   Resp: 18 20 18    Height:      Weight:      SpO2: 97% 100% 95%    Weight change:   Intake/Output Summary (Last 24 hours) at 01/05/12 0859 Last data filed at 01/05/12 4098  Gross per 24 hour  Intake 3061.67 ml  Output   1329 ml  Net 1732.67 ml    Physical Exam: General: Awake, Oriented, No acute distress. HEENT: EOMI. Neck: Supple CV: S1 and S2 Lungs: Clear to ascultation bilaterally Abdomen: Soft, Nontender, Nondistended, +bowel sounds. Ext: Good pulses. Trace edema.  Lab Results: Basic Metabolic Panel:  Lab 01/05/12 1191 01/04/12 0630 01/04/12 0054  NA 139 -- 143  K 2.8* -- 3.6  CL 103 -- 105  CO2 23 -- --  GLUCOSE 126* -- 78  BUN 7 -- 14  CREATININE 0.40* 0.46* 0.70  CALCIUM 8.2* -- --  MG -- -- --  PHOS -- -- --   Liver Function Tests: No results found for this basename: AST:5,ALT:5,ALKPHOS:5,BILITOT:5,PROT:5,ALBUMIN:5 in the last 168 hours No results found for this basename: LIPASE:5,AMYLASE:5 in the last 168 hours No results found for this basename: AMMONIA:5 in the last 168 hours CBC:  Lab 01/05/12 0604 01/04/12 0054 01/04/12 0015  WBC 5.5 -- 5.1  NEUTROABS -- -- 2.3  HGB 10.8* 13.3 12.1  HCT 31.1* 39.0 35.5*  MCV 98.7 -- 101.1*  PLT 123* -- 152   Cardiac Enzymes: No results found for this basename: CKTOTAL:5,CKMB:5,CKMBINDEX:5,TROPONINI:5 in the  last 168 hours BNP (last 3 results) No results found for this basename: PROBNP:3 in the last 8760 hours CBG:  Lab 01/05/12 0800 01/04/12 2159 01/04/12 1654 01/04/12 1200 01/04/12 0910  GLUCAP 114* 226* 261* 104* 147*   No results found for this basename: HGBA1C:5 in the last 72 hours Other Labs: No components found with this basename: POCBNP:3 No results found for this basename: DDIMER:2 in the last 168 hours No results found for this basename: CHOL:2,HDL:2,LDLCALC:2,TRIG:2,CHOLHDL:2,LDLDIRECT:2 in the last 168 hours No results found for this basename: TSH,T4TOTAL,FREET3,T3FREE,FREET4,THYROIDAB in the last 168 hours No results found for this basename: VITAMINB12:2,FOLATE:2,FERRITIN:2,TIBC:2,IRON:2,RETICCTPCT:2 in the last 168 hours  Micro Results: No results found for this or any previous visit (from the past 240 hour(s)).  Studies/Results: Dg Chest 2 View  01/04/2012  *RADIOLOGY REPORT*  Clinical Data: Weakness, vomiting, cough, shortness of breath.  CHEST - 2 VIEW  Comparison: 05/25/2011  Findings: Cardiac enlargement with normal pulmonary vascularity. Emphysematous changes and scattered fibrosis in the lungs. Prominent nipple shadows.  No focal airspace consolidation.  No blunting of costophrenic angles.  No pneumothorax.  Calcified and tortuous aorta.  The pleural effusions have resolved since previous study.  IMPRESSION: Cardiac enlargement without vascular congestion or edema. Emphysematous changes and scattered fibrosis.  No focal consolidation.  Original Report Authenticated By: Marlon Pel, M.D.   Ct Head  Wo Contrast  01/04/2012  *RADIOLOGY REPORT*  Clinical Data: Fall, palpable abnormality over the right forehead per patient  CT HEAD WITHOUT CONTRAST  Technique:  Contiguous axial images were obtained from the base of the skull through the vertex without contrast.  Comparison: 12/07/2011  Findings: There is only minimal stranding at the high left occipital lobe overlying scalp  region in the area of previously seen scalp hematoma.  Left supraorbital scalp swelling/stranding is stable allowing for differences in technique.  Orbits and paranasal sinuses are unremarkable. Cortical volume loss noted with proportional ventricular prominence.  Periventricular white matter hypodensity likely indicates small vessel ischemic change.  No acute hemorrhage, acute infarction, or mass lesion is identified.  IMPRESSION: No new acute intracranial finding.  Chronic findings as above.  Near interval resolution of previously seen left occipital scalp hematoma.  Original Report Authenticated By: Harrel Lemon, M.D.    Medications: I have reviewed the patient's current medications. Scheduled Meds:    . cefTRIAXone (ROCEPHIN)  IV  1 g Intravenous Q24H  . enoxaparin (LOVENOX) injection  30 mg Subcutaneous Q24H  . feeding supplement  1 Container Oral TID BM  . insulin aspart  0-9 Units Subcutaneous TID WC  . levothyroxine  100 mcg Oral Daily  . potassium chloride  20 mEq Oral Daily  . potassium chloride  40 mEq Oral Once  . DISCONTD: enoxaparin (LOVENOX) injection  40 mg Subcutaneous Q24H   Continuous Infusions:    . sodium chloride 100 mL/hr at 01/04/12 2352   PRN Meds:.acetaminophen, acetaminophen, alum & mag hydroxide-simeth, docusate sodium, ondansetron (ZOFRAN) IV, ondansetron  Assessment/Plan: Urinary tract infection Outpatient treatment failure.  Urine culture sent on 01/04/2012 and results pending.  Continue ceftriaxone, antibiotic since 01/04/2012. Define a 3 day course.  Generalized Weakness Etiology unclear. Likely due urine tract infection.  PT recommending skilled nursing facility.  Diabetes SSI. Stable.   Hypothyroidism Continue levothyroxin.  Hypokalemia Replace as needed.  Anemia Drop in hemoglobin dilutional.  CODE STATUS Full code   Prophylaxis Lovenox   LOS: 2 days  Anajah Sterbenz A, MD 01/05/2012, 8:59 AM  Addendum: Tried talking to  patient's daughter Alfredo Batty [phone], but was unable, did not leave a message.  Valinda Fedie A, MD 01/05/2012, 2:38 PM  Addendum: Had a discussion with patient's daughter, she believes that her mother has been drinking alcohol daily, she is not sure how much alcohol her mother drinks.  Daughter believes she may be going through possible withdrawal.  Started the patient on CIWA protocol.  Daughter like to be notified before any decisions are made prior to her mother's discharge.  She preferred her mother to go SNF.  If the patient declines going to SNF, may need to assess her capacity.  Robbyn Hodkinson A, MD 01/05/2012, 3:04 PM

## 2012-01-06 ENCOUNTER — Inpatient Hospital Stay (HOSPITAL_COMMUNITY): Payer: Medicare Other

## 2012-01-06 DIAGNOSIS — D649 Anemia, unspecified: Secondary | ICD-10-CM

## 2012-01-06 DIAGNOSIS — R4182 Altered mental status, unspecified: Secondary | ICD-10-CM

## 2012-01-06 DIAGNOSIS — N179 Acute kidney failure, unspecified: Secondary | ICD-10-CM

## 2012-01-06 DIAGNOSIS — F10939 Alcohol use, unspecified with withdrawal, unspecified: Secondary | ICD-10-CM | POA: Diagnosis present

## 2012-01-06 DIAGNOSIS — G9340 Encephalopathy, unspecified: Secondary | ICD-10-CM

## 2012-01-06 DIAGNOSIS — F10239 Alcohol dependence with withdrawal, unspecified: Secondary | ICD-10-CM

## 2012-01-06 LAB — GLUCOSE, CAPILLARY: Glucose-Capillary: 105 mg/dL — ABNORMAL HIGH (ref 70–99)

## 2012-01-06 LAB — CBC
MCH: 34.4 pg — ABNORMAL HIGH (ref 26.0–34.0)
MCHC: 34.5 g/dL (ref 30.0–36.0)
MCV: 99.7 fL (ref 78.0–100.0)
Platelets: 117 10*3/uL — ABNORMAL LOW (ref 150–400)
RDW: 13.6 % (ref 11.5–15.5)

## 2012-01-06 LAB — BASIC METABOLIC PANEL
Calcium: 8.7 mg/dL (ref 8.4–10.5)
Creatinine, Ser: 0.4 mg/dL — ABNORMAL LOW (ref 0.50–1.10)
GFR calc Af Amer: >90 mL/min (ref >90)

## 2012-01-06 LAB — MAGNESIUM: Magnesium: 1 mg/dL — ABNORMAL LOW (ref 1.5–2.5)

## 2012-01-06 LAB — URINALYSIS, ROUTINE W REFLEX MICROSCOPIC
Glucose, UA: NEGATIVE mg/dL
Hgb urine dipstick: NEGATIVE
Protein, ur: NEGATIVE mg/dL
pH: 7 (ref 5.0–8.0)

## 2012-01-06 LAB — T4, FREE: Free T4: 1.11 ng/dL (ref 0.80–1.80)

## 2012-01-06 MED ORDER — POTASSIUM CHLORIDE CRYS ER 20 MEQ PO TBCR
40.0000 meq | EXTENDED_RELEASE_TABLET | Freq: Every day | ORAL | Status: DC
  Administered 2012-01-06 – 2012-01-07 (×2): 40 meq via ORAL
  Filled 2012-01-06 (×2): qty 2

## 2012-01-06 MED ORDER — THIAMINE HCL 100 MG PO TABS: 100.0000 mg | ORAL_TABLET | Freq: Every day | ORAL | Status: DC

## 2012-01-06 MED ORDER — POTASSIUM CHLORIDE 20 MEQ PO PACK: 20.0000 meq | PACK | Freq: Every day | ORAL | Status: DC

## 2012-01-06 MED ORDER — INSULIN ASPART 100 UNIT/ML ~~LOC~~ SOLN: SUBCUTANEOUS | Status: DC

## 2012-01-06 MED ORDER — ADULT MULTIVITAMIN W/MINERALS CH: 1.0000 | ORAL_TABLET | Freq: Every day | ORAL | Status: DC

## 2012-01-06 MED ORDER — BOOST / RESOURCE BREEZE PO LIQD: 1.0000 | Freq: Three times a day (TID) | ORAL | Status: DC

## 2012-01-06 MED ORDER — DSS 100 MG PO CAPS: 100.0000 mg | ORAL_CAPSULE | Freq: Every day | ORAL | Status: AC | PRN

## 2012-01-06 MED ORDER — DEXTROSE 50 % IV SOLN
INTRAVENOUS | Status: AC
  Administered 2012-01-06: 25 mL
  Filled 2012-01-06: qty 50

## 2012-01-06 MED ORDER — FOLIC ACID 1 MG PO TABS: 1.0000 mg | ORAL_TABLET | Freq: Every day | ORAL | Status: DC

## 2012-01-06 MED ORDER — DEXTROSE 5 % IV SOLN
1.0000 g | INTRAVENOUS | Status: DC
  Administered 2012-01-06: 1 g via INTRAVENOUS
  Filled 2012-01-06 (×3): qty 10

## 2012-01-06 NOTE — Progress Notes (Signed)
CBG:63  Treatment: D50 IV 25 mL  Symptoms: Nervous/irritable  Follow-up CBG: Time: 2157 CBG Result:63  Possible Reasons for Event: Inadequate meal intake  Comments/MD notified:pt didn't have an adequate amount of oral intake, pt doesn't eat much.     Janika Jedlicka S

## 2012-01-06 NOTE — Progress Notes (Signed)
Patient ID: [name]  female  ZOX:[id number]    DOB: Aug 22, 1933    DOA: 01/03/2012  PCP: Elby Showers, MD  Subjective: Patient appears to be somewhat confused, did agree yesterday for SNF. Denies any pain, cough, fevers, chills, shortness breath.  Objective: Weight change:   Intake/Output Summary (Last 24 hours) at 01/06/12 1343 Last data filed at 01/06/12 0908  Gross per 24 hour  Intake    240 ml  Output      1 ml  Net    239 ml   Blood pressure 136/91, pulse 66, temperature 97.7 F (36.5 C), temperature source Oral, resp. rate 20, height 4' 10.8" (1.494 m), weight 36.333 kg (80 lb 1.6 oz), last menstrual period 05/17/1983, SpO2 98.00%.  Physical Exam: General: Alert and awake, oriented to self not in any acute distress. HEENT: anicteric sclera, pupils reactive to light and accommodation, EOMI CVS: S1-S2 clear, no murmur rubs or gallops Chest: clear to auscultation bilaterally, no wheezing, rales or rhonchi Abdomen: soft nontender, nondistended, normal bowel sounds, no organomegaly Extremities: no cyanosis, clubbing or edema noted bilaterally Neuro: Cranial nerves II-XII intact, no focal neurological deficits  Lab Results: Basic Metabolic Panel:  Lab 01/06/12 8119 01/05/12 0604  NA 137 139  K 3.3* 2.8*  CL 102 103  CO2 22 23  GLUCOSE 109* 126*  BUN 6 7  CREATININE 0.40* 0.40*  CALCIUM 8.7 8.2*  MG 1.0* --  PHOS -- --   CBC:  Lab 01/06/12 0657 01/05/12 0604 01/04/12 0015  WBC 5.9 5.5 --  NEUTROABS -- -- 2.3  HGB 11.6* 10.8* --  HCT 33.6* 31.1* --  MCV 99.7 98.7 --  PLT 117* 123* --   Cardiac Enzymes: No results found for this basename: CKTOTAL:3,CKMB:3,CKMBINDEX:3,TROPONINI:3 in the last 168 hours BNP: No components found with this basename: POCBNP:2 CBG:  Lab 01/06/12 1207 01/06/12 0747 01/06/12 0217 01/05/12 2124 01/05/12 1831  GLUCAP 105* 105* 102* 84 129*     Micro Results: Recent Results (from the past 240 hour(s))  URINE CULTURE      Status: Normal   Collection Time   01/04/12 12:47 AM      Component Value Range Status Comment   Specimen Description URINE, CLEAN CATCH   Final    Special Requests NONE   Final    Culture  Setup Time 01/04/2012 03:32   Final    Colony Count 85,000 COLONIES/ML   Final    Culture     Final    Value: Multiple bacterial morphotypes present, none predominant. Suggest appropriate recollection if clinically indicated.   Report Status 01/05/2012 FINAL   Final     Studies/Results: Dg Chest 2 View  01/04/2012  *RADIOLOGY REPORT*  Clinical Data: Weakness, vomiting, cough, shortness of breath.  CHEST - 2 VIEW  Comparison: 05/25/2011  Findings: Cardiac enlargement with normal pulmonary vascularity. Emphysematous changes and scattered fibrosis in the lungs. Prominent nipple shadows.  No focal airspace consolidation.  No blunting of costophrenic angles.  No pneumothorax.  Calcified and tortuous aorta.  The pleural effusions have resolved since previous study.  IMPRESSION: Cardiac enlargement without vascular congestion or edema. Emphysematous changes and scattered fibrosis.  No focal consolidation.  Original Report Authenticated By: Marlon Pel, M.D.   Ct Head Wo Contrast  01/06/2012  *RADIOLOGY REPORT*  Clinical Data: Sudden onset of confusion.  CT HEAD WITHOUT CONTRAST  Technique:  Contiguous axial images were obtained from the base of the skull through the vertex without contrast.  Comparison:  01/04/2012 and multiple previous  Findings: The brain shows generalized atrophy.  There is extensive chronic small vessel change throughout the hemispheric white matter.  No sign of acute infarction, mass lesion, hemorrhage, hydrocephalus or extra-axial collection.  No calvarial abnormality. Sinuses are clear.  There is atherosclerotic calcification of the major vessels at the base of the brain.  IMPRESSION: Atrophy.  Chronic small vessel disease.  No acute finding.  Original Report Authenticated By: Thomasenia Sales, M.D.   Ct Head Wo Contrast  01/04/2012  *RADIOLOGY REPORT*  Clinical Data: Fall, palpable abnormality over the right forehead per patient  CT HEAD WITHOUT CONTRAST  Technique:  Contiguous axial images were obtained from the base of the skull through the vertex without contrast.  Comparison: 12/07/2011  Findings: There is only minimal stranding at the high left occipital lobe overlying scalp region in the area of previously seen scalp hematoma.  Left supraorbital scalp swelling/stranding is stable allowing for differences in technique.  Orbits and paranasal sinuses are unremarkable. Cortical volume loss noted with proportional ventricular prominence.  Periventricular white matter hypodensity likely indicates small vessel ischemic change.  No acute hemorrhage, acute infarction, or mass lesion is identified.  IMPRESSION: No new acute intracranial finding.  Chronic findings as above.  Near interval resolution of previously seen left occipital scalp hematoma.  Original Report Authenticated By: Harrel Lemon, M.D.   Ct Head Wo Contrast  12/07/2011  *RADIOLOGY REPORT*  Clinical Data: Fall.  CT HEAD WITHOUT CONTRAST  Technique:  Contiguous axial images were obtained from the base of the skull through the vertex without contrast.  Comparison: Brain MRI 11/08/2010.  Head CT scan 02/07/2010.  Findings: There is cortical atrophy and chronic microvascular ischemic change.  No evidence of acute intracranial abnormality including infarction, hemorrhage, mass lesion, mass effect, midline shift or abnormal extra-axial fluid collection.  Scalp hematoma to the left of midline posteriorly is noted.  No underlying fracture.  IMPRESSION:  1.  Scalp hematoma without underlying fracture or acute intracranial abnormality. 2.  Atrophy and chronic microvascular ischemic change.  Original Report Authenticated By: Bernadene Bell. Maricela Curet, M.D.    Medications: Scheduled Meds:   . cefTRIAXone (ROCEPHIN)  IV  1 g Intravenous  Q24H  . enoxaparin (LOVENOX) injection  30 mg Subcutaneous Q24H  . feeding supplement  1 Container Oral TID BM  . folic acid  1 mg Oral Daily  . insulin aspart  0-9 Units Subcutaneous TID WC  . levothyroxine  100 mcg Oral Daily  . multivitamin with minerals  1 tablet Oral Daily  . potassium chloride  20 mEq Oral Daily  . thiamine  100 mg Oral Daily  . DISCONTD: cefTRIAXone (ROCEPHIN)  IV  1 g Intravenous Q24H  . DISCONTD: thiamine  100 mg Intravenous Daily   Continuous Infusions:    Assessment/Plan:  Altered mental status: Possibly due to alcohol withdrawal however rule out CVA - Stat CT head done shows atrophy but now acute CVA or hemorrhage - Patient's daughter had relayed that she has been drinking alcohol, placed on CIWA protocol, thiamine, folic acid, MVI  Urinary tract infection  Outpatient treatment failure. Urine culture sent on 01/04/2012 showed 85,000 colonies however given her mental status changes, I will continue ceftriaxone.  Generalized Weakness  Etiology unclear. Likely due urine tract infection. PT recommending skilled nursing facility.   Diabetes  - Continue sensitives SSI.   Hypothyroidism:  Continue levothyroxin.   Hypokalemia: Replaced   Anemia  Drop in hemoglobin likely, dilutional.  CODE STATUS  Full code   Prophylaxis Lovenox  Disposition: To SNF when medically stable, 1-2 days   LOS: 3 days   Charde Macfarlane M.D. [facility] 01/06/2012, 1:43 PM Pager: [phone]  If 7PM-7AM, please contact night-coverage [email or web address] Password [email or web address]

## 2012-01-06 NOTE — Progress Notes (Signed)
Physical Therapy Treatment Patient Details Name: ARALI SOMERA MRN: [id number] DOB: [birth year] Today's Date: 01/06/2012 Time: 0865-7846 PT Time Calculation (min): 19 min  PT Assessment / Plan / Recommendation Comments on Treatment Session  Pt's RN present entire session.  Upon arrival pt laying horizontal in bed & stating she needed to get her shotgun for protection against the cows.  Pt difficult to redirect.  Pt required increased assistance for all mobility today.  Required max directional cues for all tasks but pt only followed ~25% of cueing throughout.      Follow Up Recommendations  Skilled nursing facility    Barriers to Discharge        Equipment Recommendations  Defer to next venue    Recommendations for Other Services    Frequency Min 3X/week   Plan      Precautions / Restrictions Restrictions Weight Bearing Restrictions: No       Mobility  Bed Mobility Bed Mobility: Supine to Sit;Sitting - Scoot to Edge of Bed Supine to Sit: 2: Max assist;With rails Sitting - Scoot to [place] of Bed: 3: Mod assist Details for Bed Mobility Assistance: Max directional cues.  Assist for LE's & to lift shoulders/trunk upright.   When flattened HOB pt began to panic & "squirmming" all around stating "Im sliding down & I can't get up".  When sitting up on EOB pt very resistive & leaning backwards despite cues for upright sitting.   Transfers Transfers: Sit to Stand;Stand to Sit;Stand Pivot Transfers Sit to Stand: 1: +2 Total assist;With upper extremity assist;From bed Sit to Stand: Patient Percentage: 60% Stand to Sit: 1: +1 Total assist;With upper extremity assist;To chair/3-in-1 Stand Pivot Transfers: 1: +2 Total assist Stand Pivot Transfers: Patient Percentage: 40% Details for Transfer Assistance: Max directional cues technique & safety.  Assist for balance & to pivot pelvis from bed>recliner.  Pt not taking any pivotal steps.  Significant difficulty following any cues.     Ambulation/Gait Ambulation/Gait Assistance: Not tested (comment)     PT Goals Acute Rehab PT Goals Time For Goal Achievement: 01/18/12 PT Goal: Supine/Side to Sit - Progress: Not progressing PT Goal: Sit to Stand - Progress: Not progressing PT Goal: Stand to Sit - Progress: Not progressing  Visit Information  Last PT Received On: 01/06/12 Assistance Needed: +2    Subjective Data  Subjective: I need to get my shotgun to protect myself   Cognition  Overall Cognitive Status: Impaired Area of Impairment: Following commands;Safety/judgement Arousal/Alertness: Awake/alert Orientation Level: Other (comment);Appears intact for tasks assessed Following Commands: Follows one step commands inconsistently Safety/Judgement: Decreased awareness of safety precautions;Decreased awareness of need for assistance Cognition - Other Comments: Pt answered orientation questions appropriately but seemed to be very confused.  Pt stating "I have to get my shotgun to protect myself".  Pt also thinking her son was present in room despite continuous correction.      Balance     End of Session PT - End of Session Equipment Utilized During Treatment: Gait belt Activity Tolerance: Other (comment) (limited by confusion) Patient left: in chair;with call bell/phone within reach;with chair alarm set Nurse Communication: Mobility status    Verdell Face, [place] [phone] 01/06/2012

## 2012-01-06 NOTE — Progress Notes (Signed)
Pt had low cbg of 84. Pt was given resource. Pt cbg was rechecked and moved up to 102

## 2012-01-06 NOTE — Progress Notes (Signed)
MD notified that pt is having visual hallucinations.

## 2012-01-06 NOTE — Progress Notes (Signed)
Clinical Social Worker continuing to follow for discharge planning. Pt agreeable to SNF at [facility] in [place]. Clinical Child psychotherapist met with pt at bedside this morning to discuss and pt with increased confusion at this time stating that "a man is in the hallway and who authorized this man to work on her house." Clinical Social Worker redirected patient and pt remained confused despite redirection. Pt daughter contacted pt room while Clinical Social Worker present in room and Visual merchandiser assisted pt with answering the phone. Pt continued to say same thing to pt daughter at this time. Pt provided this Clinical Social Worker telephone partly through conversation with pt daughter. Pt daughter concerned about pt confusion and feels that pt speech is slurred. Pt daughter recognizes pt confusion could be from a number of things, but is concerned that confusion could possibly be due to alcohol withdrawal. Clinical Social Worker discussed with MD about pt change in mental status and notified MD of pt daughters concerns. Clinical Social Worker re-contacted pt daughter and discussed that pt not yet medically stable for discharge and that pt being treated for possible DT's and MD ordering repeat CT to rule out CVA or hemorrhage. Pt daughter expressed appreciation for update and stated that she will be returning from vacation tomorrow and will arrive at the hospital at that time. Clinical Social Worker contacted [facility] [place] to notify that pt not yet medically stable for discharge. Clinical Social Worker to facilitate pt discharge needs when pt medically ready for discharge.  Jacklynn Lewis, MSW, LCSWA  Clinical Social Work [phone]

## 2012-01-07 DIAGNOSIS — K700 Alcoholic fatty liver: Secondary | ICD-10-CM

## 2012-01-07 LAB — GLUCOSE, CAPILLARY
Glucose-Capillary: 110 mg/dL — ABNORMAL HIGH (ref 70–99)
Glucose-Capillary: 224 mg/dL — ABNORMAL HIGH (ref 70–99)
Glucose-Capillary: 141 mg/dL — ABNORMAL HIGH (ref 70–99)

## 2012-01-07 MED ORDER — CEPHALEXIN 250 MG PO CAPS: 250.0000 mg | ORAL_CAPSULE | Freq: Two times a day (BID) | ORAL | Status: AC

## 2012-01-07 MED ORDER — CEPHALEXIN 250 MG PO CAPS
250.0000 mg | ORAL_CAPSULE | Freq: Two times a day (BID) | ORAL | Status: DC
  Administered 2012-01-07: 250 mg via ORAL
  Filled 2012-01-07 (×2): qty 1

## 2012-01-07 MED ORDER — LORAZEPAM 0.5 MG PO TABS: 0.5000 mg | ORAL_TABLET | Freq: Two times a day (BID) | ORAL | Status: AC | PRN

## 2012-01-07 NOTE — Clinical Documentation Improvement (Signed)
MALNUTRITION DOCUMENTATION CLARIFICATION  THIS DOCUMENT IS NOT A PERMANENT PART OF THE MEDICAL RECORD  TO RESPOND TO THE THIS QUERY, FOLLOW THE INSTRUCTIONS BELOW:  1. If needed, update documentation for the patient's encounter via the notes activity.  2. Access this query again and click edit on the In [employer].  3. After updating, or not, click F2 to complete all highlighted (required) fields concerning your review. Select "additional documentation in the medical record" OR "no additional documentation provided".  4. Click Sign note button.  5. The deficiency will fall out of your In Basket *Please let us know if you are not able to complete this workflow by phone or e-mail (listed below).  Please update your documentation within the medical record to reflect your response to this query.                                                                                        01/07/12   Dear Dr. Rod Can / Associates,  In a better effort to capture your patient's severity of illness, reflect appropriate length of stay and utilization of resources, a review of the patient medical record has revealed the following indicators.    Based on your clinical judgment, please clarify and document in a progress note and/or discharge summary the clinical condition associated with the following supporting information:  In responding to this query please exercise your independent judgment.  The fact that a query is asked, does not imply that any particular answer is desired or expected.  Per nutrition consult patient with BMI of 16.29 and meeting criteria for "severe malnutrition"  and is  "underweight" if this is an appropriate secondary diagnosis please document in progress notes.   If not please document diagnosis pertaining to nutritonal level and BMI < 16.29.  Thank you    Possible Clinical Conditions?  Severe Malnutrition    Protein Calorie Malnutrition  Severe Protein Calorie  Malnutrition  Cachexia    Other Condition  Cannot clinically determine    : Risk Factors: diabetic, alcohol abuse, UTI, age    Signs & Symptoms: Ht  4' 10.8"(149.4 cm)  Wt 80 lbs 1.6 oz (36.33kg)  BMI: 16.29  Weight  Loss 29% over 7 months  Diagnostics:  Albumin level:  Normal   Total Protein:  Normal   Calcium level: Normal    Nutrition Consult: " Severe malnutrition in the context of social or environmental circumstances  -Underweight        You may use possible, probable, or suspect with inpatient documentation. possible, probable, suspected diagnoses MUST be documented at the time of discharge  Reviewed:Addended on the patient's DC summary.  Thank You,  Leonette Most Addison  Clinical Documentation Specialist RN, BSN:  Pager [phone]  HIM off # [phone]  Health Information Management [place]

## 2012-01-07 NOTE — Clinical Documentation Improvement (Signed)
CHANGE MENTAL STATUS DOCUMENTATION CLARIFICATION   THIS DOCUMENT IS NOT A PERMANENT PART OF THE MEDICAL RECORD  TO RESPOND TO THE THIS QUERY, FOLLOW THE INSTRUCTIONS BELOW:  1. If needed, update documentation for the patient's encounter via the notes activity.  2. Access this query again and click edit on the In [employer].  3. After updating, or not, click F2 to complete all highlighted (required) fields concerning your review. Select "additional documentation in the medical record" OR "no additional documentation provided".  4. Click Sign note button.  5. The deficiency will fall out of your In Basket *Please let us know if you are not able to complete this workflow by phone or e-mail (listed below).         01/07/12  Dear Dr. Rod Can Marton [place]  In an effort to better capture your patient's severity of illness, reflect appropriate length of stay and utilization of resources, a review of the patient medical record has revealed the following indicators.    Based on your clinical judgment, please clarify and document in a progress note and/or discharge summary the clinical condition associated with the following supporting information:  In responding to this query please exercise your independent judgment.  The fact that a query is asked, does not imply that any particular answer is desired or expected.  Noted documenting "altered mental status' if possible can diagnosis be further specified.  Thank you   Possible Clinical Conditions?   Encephalopathy (describe type if known)                                             Alcoholic                                              Metabolic                                             Toxic Acute confusion  Acute delirium   Other Condition  Cannot Clinically Determine     Risk Factors: diabetes , rec dx UTI noncompliant with oral antibiotics , alcohol abuse, age    Signs & Symptoms: " altered mental status" on admit , per  nursing note having "visual hallucinations" (7/17)  Diagnostics: ZOX:[id number]  Cx: 85,000 COLONIES/ML Culture Multiple bacterial morphotypes present, none predominant. Suggest appropriate recollection if clinically indicated 7/15    CT of head  Scan:   IMPRESSION:  No new acute intracranial finding. Chronic findings as above.  Near interval resolution of previously seen left occipital scalp  hematoma.  Treatment: IV Rocephin 1 gm q 24 hrs,  NS IV @ 100 ml / hr   Reviewed: multifactorial, please refer to my DC summary Thank You,  Leonette Most Addison  Clinical Documentation Specialist RN, BSN:  Pager [phone]  HIM off # [phone]  Health Information Management [place]

## 2012-01-07 NOTE — Progress Notes (Signed)
report given to Whitman Hero RN ay [facility].

## 2012-01-07 NOTE — Progress Notes (Signed)
Physical Therapy Treatment Patient Details Name: [name] MRN: [id number] DOB: 1933-08-04 Today's Date: 01/07/2012 Time: 5784-6962 PT Time Calculation (min): 21 min  PT Assessment / Plan / Recommendation Comments on Treatment Session  Pt with generalized weakness and AMS. Able to ambulate and mobilize today but decreased gait and transfers from prior sessions. Pt with decreased memory and safety awareness and requires mod assist for all transfers.     Follow Up Recommendations       Barriers to Discharge        Equipment Recommendations       Recommendations for Other Services    Frequency     Plan Discharge plan remains appropriate;Frequency remains appropriate    Precautions / Restrictions Precautions Precautions: Fall Precaution Comments: incontinence Restrictions Weight Bearing Restrictions: No   Pertinent Vitals/Pain Achy feet    Mobility  Bed Mobility Bed Mobility: Supine to Sit Supine to Sit: 4: Min assist Sitting - Scoot to Edge of Bed: 4: Min assist Details for Bed Mobility Assistance: cueing for sequence with rail and assist to scoot pelvis to EOB Transfers Transfers: Sit to Stand;Stand to Sit;Stand Pivot Transfers Sit to Stand: 3: Mod assist;From bed;From chair/3-in-1 Stand to Sit: 3: Mod assist;To chair/3-in-1 Stand Pivot Transfers: 3: Mod assist Details for Transfer Assistance: pt with assist to control pelvis and safely descend to surface with cueing for hand placement and safety Ambulation/Gait Ambulation Distance (Feet): 100 Feet Assistive device: Rolling walker Ambulation/Gait Assistance Details: pt with short shuffling steps with cueing to increase stride length, step into RW and extend trunk  Gait Pattern: Shuffle;Trunk flexed Gait velocity: decreased Stairs: No    Exercises     PT Diagnosis:    PT Problem List:   PT Treatment Interventions:     PT Goals Acute Rehab PT Goals Pt will go Supine/Side to Sit: with supervision PT  Goal: Supine/Side to Sit - Progress: Revised due to lack of progress Pt will go Sit to Supine/Side: with supervision PT Goal: Sit to Supine/Side - Progress: Revised due to lack of progress PT Goal: Sit to Stand - Progress: Progressing toward goal PT Goal: Stand to Sit - Progress: Progressing toward goal PT Goal: Ambulate - Progress: Progressing toward goal  Visit Information  Last PT Received On: 01/07/12 Assistance Needed: +1    Subjective Data  Subjective: I called but they didn't get me up to go to the bathroom   Cognition  Overall Cognitive Status: Impaired Area of Impairment: Memory;Safety/judgement;Problem solving Arousal/Alertness: Awake/alert Orientation Level: Appears intact for tasks assessed Behavior During Session: [facility] for tasks performed Memory Deficits: Pt unable to recall events of last 24hrs. Pt does not recall prior walks or safety conversations Following Commands: Follows one step commands inconsistently Safety/Judgement: Decreased awareness of need for assistance Problem Solving: pt requires assist for problem solving    Balance  Static Sitting Balance Static Sitting - Balance Support: Bilateral upper extremity supported;Feet supported Static Sitting - Level of Assistance: 5: Stand by assistance Static Sitting - Comment/# of Minutes: 4  End of Session PT - End of Session Equipment Utilized During Treatment: Gait belt Activity Tolerance: Patient tolerated treatment well Patient left: in chair;with call bell/phone within reach Nurse Communication: Mobility status   GP     Delorse Lek 01/07/2012, 12:15 PM Delaney Meigs, PT [phone]

## 2012-01-07 NOTE — Discharge Summary (Addendum)
Physician Discharge Summary  Patient ID: [name] MRN: [id number] DOB/AGE: 76-Feb-1935 76 y.o.  Admit date: 01/03/2012 Discharge date: 01/07/2012  Primary Care Physician:  Elby Showers, MD  Discharge Diagnoses:    . generalized weakness  .UTI (lower urinary tract infection) .DM (diabetes mellitus) .Hypothyroid .Altered mental state improved .Alcohol withdrawal GERD Hypothyroidism  Consults:  None   Discharge Medications: Medication List  As of 01/07/2012  1:44 PM   STOP taking these medications         PRESCRIPTION MEDICATION         TAKE these medications         cephALEXin 250 MG capsule   Commonly known as: KEFLEX   Take 1 capsule (250 mg total) by mouth 2 (two) times daily. X 3 days      DSS 100 MG Caps   Take 100 mg by mouth daily as needed.      feeding supplement Liqd   Take 1 Container by mouth 3 (three) times daily between meals.      folic acid 1 MG tablet   Commonly known as: FOLVITE   Take 1 tablet (1 mg total) by mouth daily.      insulin aspart 100 UNIT/ML injection   Commonly known as: novoLOG   CBG < 70: implement hypoglycemia protocol, drink orange juice  CBG 70 - 120: 0 units CBG 121 - 150: 1 unit,  CBG 151 - 200: 2 units,  CBG 201 - 250: 3 units,  CBG 251 - 300: 5 units,  CBG 301 - 350: 7 units,  CBG 351 - 400: 9 units   CBG > 400: 9 units and notify MD      levothyroxine 100 MCG tablet   Commonly known as: SYNTHROID, LEVOTHROID   Take 100 mcg by mouth daily.      LORazepam 0.5 MG tablet   Commonly known as: ATIVAN   Take 1 tablet (0.5 mg total) by mouth every 12 (twelve) hours as needed for anxiety (shakiness).      multivitamin with minerals Tabs   Take 1 tablet by mouth daily.      potassium chloride 20 MEQ packet   Commonly known as: KLOR-CON   Take 20 mEq by mouth daily.      thiamine 100 MG tablet   Take 1 tablet (100 mg total) by mouth daily.             Brief H and P: For complete details please refer to  admission H and P, but in brief patient is a 78 year old female with history of diabetes, hypothyroidism, COPD, GERD who presented to ED with complaints of generalized weakness that began on the day of admission. Patient was not able to get up from the couch and called EMS. Patient was noticed to have generalized weakness and not localized to one side. She denied any headache, syncope, focal weakness, slurred speech. She was recently diagnosed with a UTI and was treated with ciprofloxacin.  Hospital Course:  Patient was admitted with generalized weakness and was noticed also with some confusion and altered mental status. Patient was admitted and started on IV antibiotics. Altered mental status: Significantly improved, patient had a CT scan done at the time of admission which showed no acute intracranial findings any interval resolution of previously seen left occipital scalp hematoma. On 01/06/2012 patient was noticed to be more acutely and confused, hallucinating and shaky. Patient's daughter reported that patient may be in alcohol withdrawal. CT head was done  and was ruled out for acute CVA or any intracranial abnormality.patient was continued on Stat CT head done shows atrophy but now acute CVA or hemorrhage. Patient was continued on CIWA protocol, thiamine, folic acid, MVI. At the time of discharge, patient's mental status has significantly improved and she is oriented, conversing appropriately.   Urinary tract infection Outpatient treatment failure. Urine culture sent on 01/04/2012 showed 85,000 colonies however given her mental status changes, patient was continued on Rocephin. She will continue Keflex for 3 days after DC.  Generalized Weakness: Etiology unclear. Likely due urine tract infection. PT evaluation recommended skilled nursing facility.   Diabetes:  Continue sensitives SSI.   Hypothyroidism: continue levothyroxin.  Hypokalemia: Replaced  Anemia: Drop in hemoglobin likely, dilutional.     Day of Discharge BP 105/71  Pulse 86  Temp 98.2 F (36.8 C) (Oral)  Resp 18  Ht 4' 10.8" (1.494 m)  Wt 36.333 kg (80 lb 1.6 oz)  BMI 16.29 kg/m2  SpO2 99%  LMP 05/17/1983  Physical Exam: General: Alert and awake oriented x3 not in any acute distress. HEENT: anicteric sclera, pupils reactive to light and accommodation CVS: S1-S2 clear no murmur rubs or gallops Chest: clear to auscultation bilaterally, no wheezing rales or rhonchi Abdomen: soft nontender, nondistended, normal bowel sounds, no organomegaly Extremities: no cyanosis, clubbing or edema noted bilaterally Neuro: Cranial nerves II-XII intact, no focal neurological deficits   The results of significant diagnostics from this hospitalization (including imaging, microbiology, ancillary and laboratory) are listed below for reference.    LAB RESULTS: Basic Metabolic Panel:  Lab 01/06/12 2956 01/05/12 0604  NA 137 139  K 3.3* 2.8*  CL 102 103  CO2 22 23  GLUCOSE 109* 126*  BUN 6 7  CREATININE 0.40* 0.40*  CALCIUM 8.7 8.2*  MG 1.0* --  PHOS -- --  CBC:  Lab 01/06/12 0657 01/05/12 0604 01/04/12 0015  WBC 5.9 5.5 --  NEUTROABS -- -- 2.3  HGB 11.6* 10.8* --  HCT 33.6* 31.1* --  MCV 99.7 -- --  PLT 117* 123* --   CBG:  Lab 01/07/12 1140 01/07/12 0744  GLUCAP 224* 110*    Significant Diagnostic Studies:  Dg Chest 2 View  01/04/2012  *RADIOLOGY REPORT*  Clinical Data: Weakness, vomiting, cough, shortness of breath.  CHEST - 2 VIEW  Comparison: 05/25/2011  Findings: Cardiac enlargement with normal pulmonary vascularity. Emphysematous changes and scattered fibrosis in the lungs. Prominent nipple shadows.  No focal airspace consolidation.  No blunting of costophrenic angles.  No pneumothorax.  Calcified and tortuous aorta.  The pleural effusions have resolved since previous study.  IMPRESSION: Cardiac enlargement without vascular congestion or edema. Emphysematous changes and scattered fibrosis.  No focal  consolidation.  Original Report Authenticated By: Marlon Pel, M.D.   Ct Head Wo Contrast  01/04/2012  *RADIOLOGY REPORT*  Clinical Data: Fall, palpable abnormality over the right forehead per patient  CT HEAD WITHOUT CONTRAST  Technique:  Contiguous axial images were obtained from the base of the skull through the vertex without contrast.  Comparison: 12/07/2011  Findings: There is only minimal stranding at the high left occipital lobe overlying scalp region in the area of previously seen scalp hematoma.  Left supraorbital scalp swelling/stranding is stable allowing for differences in technique.  Orbits and paranasal sinuses are unremarkable. Cortical volume loss noted with proportional ventricular prominence.  Periventricular white matter hypodensity likely indicates small vessel ischemic change.  No acute hemorrhage, acute infarction, or mass lesion is identified.  IMPRESSION: No new acute intracranial finding.  Chronic findings as above.  Near interval resolution of previously seen left occipital scalp hematoma.  Original Report Authenticated By: Harrel Lemon, M.D.     Disposition and Follow-up: Discharge Orders    Future Orders Please Complete By Expires   Diet Carb Modified      Increase activity slowly          DISPOSITION: Skilled nursing facility DIET: Carb modified diet ACTIVITY: As tolerated   DISCHARGE FOLLOW-UP Follow-up Information    Follow up with [facility], [place]. Schedule an appointment as soon as possible for a visit in 2 weeks.   Contact information:   [address] [place] [address] [place] [address] [phone]          Time spent on Discharge: 45 minutes  Signed:   Eldra Word M.D. [facility] 01/07/2012, 1:44 PM Pager: [phone]  If 7PM-7AM, please contact night-coverage [email or web address] Password Darlin Drop M.D. [facility] 01/07/2012, 1:44 PM  Pager: [phone]    Per nutrition  consult, Ms. Hedstrom has BMI of 16.29 and meets criteria for severe malnutrition and is underweight. Patient was provided with nutritional supplements.   Kriss Ishler M.D. [facility] 01/08/2012, 1:20 PM  Pager: [phone]

## 2012-01-07 NOTE — Progress Notes (Signed)
Pt d/c'd and picked up by family and was taken out in wheelchair by Katrina, NT. D/c instructions given to daughter and pt has all belongings. Pt currently stable.

## 2012-01-07 NOTE — Progress Notes (Signed)
Clinical Social Worker received notification from MD that pt altered mental status has resolved and pt medically stable for discharge today. Clinical Social Worker confirmed with MD that pt safe to transport via car by pt daughter. Clinical Social Worker contacted pt daughter to notify and pt daughter reports pt daughter is traveling home from vacation and will be here at hospital approximately at 5:30pm to transport pt to [place] [facility]. Clinical Social Worker discussed with facility who confirmed that pt could admit to facility late this evening.   Clinical Child psychotherapist facilitated pt discharge needs including contacting facility, faxing pt discharge summary to [facility] [employer], Wellsite geologist with facility contact information to call report, discussing with RN pt daughter plan to be here at approximately 5:30pm to provide transport for pt and notifying RN of discharge packet being placed in wall a roo to provide to pt daughter at discharge. Clinical Social Worker notified pt daughter of need for discharge packet to provide to facility at admission. Clinical Social Worker discussed with pt at bedside and pt agreeable to plan. Pt discharging to [facility] via pt daughter by car this evening.  No further social work needs identified at this time. Clinical Social Worker signing off.  Jacklynn Lewis, MSW, LCSWA  Clinical Social Work [phone]

## 2012-01-07 NOTE — Progress Notes (Signed)
Inpatient Diabetes Program Recommendations  AACE/ADA: New Consensus Statement on Inpatient Glycemic Control (2009)  Target Ranges:  Prepandial:   less than 140 mg/dL      Peak postprandial:   less than 180 mg/dL (1-2 hours)      Critically ill patients:  140 - 180 mg/dL   Reason for Visit: Hyperglycemia following meals and then hypoglycemia following correction .  Inpatient Diabetes Program Recommendations Insulin - Meal Coverage: Please add 2 units meal coverage in order to prevent need for large doses of correction scale which can present potential for hypoglycemia.  Note: Thank you, Lenor Coffin, RN, CNS, Diabetes Coordinator [phone])

## 2012-01-07 NOTE — Progress Notes (Addendum)
PT had a low cbg of 63. Pt was given 25 ml of dextrose & some resource breeze. Pt cbg was rechecked in 15 mins at 2157 and cbg was 263.

## 2012-01-08 NOTE — Plan of Care (Signed)
Per nutrition consult, Ms. Bily has BMI of 16.29 and meets criteria for "severe malnutrition" and is "underweight". I will addend it to the previously dictated DC summary.   Azavion Bouillon M.D. [facility] 01/08/2012, 1:19 PM  Pager: [phone]

## 2012-01-13 LAB — URINE CULTURE: Colony Count: 45000

## 2012-02-11 ENCOUNTER — Inpatient Hospital Stay (HOSPITAL_COMMUNITY): Payer: Medicare Other

## 2012-02-11 ENCOUNTER — Encounter (HOSPITAL_COMMUNITY): Payer: Self-pay | Admitting: Emergency Medicine

## 2012-02-11 ENCOUNTER — Inpatient Hospital Stay (HOSPITAL_COMMUNITY)
Admission: EM | Admit: 2012-02-11 | Discharge: 2012-02-13 | DRG: 373 | Disposition: A | Discharge status: Home / Self Care | Payer: Medicare Other | Attending: Internal Medicine | Admitting: Internal Medicine

## 2012-02-11 DIAGNOSIS — E781 Pure hyperglyceridemia: Secondary | ICD-10-CM

## 2012-02-11 DIAGNOSIS — F10239 Alcohol dependence with withdrawal, unspecified: Secondary | ICD-10-CM

## 2012-02-11 DIAGNOSIS — E8729 Other acidosis: Secondary | ICD-10-CM

## 2012-02-11 DIAGNOSIS — F1010 Alcohol abuse, uncomplicated: Secondary | ICD-10-CM

## 2012-02-11 DIAGNOSIS — N179 Acute kidney failure, unspecified: Secondary | ICD-10-CM

## 2012-02-11 DIAGNOSIS — R4182 Altered mental status, unspecified: Secondary | ICD-10-CM

## 2012-02-11 DIAGNOSIS — D696 Thrombocytopenia, unspecified: Secondary | ICD-10-CM

## 2012-02-11 DIAGNOSIS — R197 Diarrhea, unspecified: Secondary | ICD-10-CM | POA: Diagnosis present

## 2012-02-11 DIAGNOSIS — A419 Sepsis, unspecified organism: Secondary | ICD-10-CM

## 2012-02-11 DIAGNOSIS — Z79899 Other long term (current) drug therapy: Secondary | ICD-10-CM

## 2012-02-11 DIAGNOSIS — F10939 Alcohol use, unspecified with withdrawal, unspecified: Secondary | ICD-10-CM

## 2012-02-11 DIAGNOSIS — K700 Alcoholic fatty liver: Secondary | ICD-10-CM

## 2012-02-11 DIAGNOSIS — E039 Hypothyroidism, unspecified: Secondary | ICD-10-CM

## 2012-02-11 DIAGNOSIS — G9340 Encephalopathy, unspecified: Secondary | ICD-10-CM

## 2012-02-11 DIAGNOSIS — R531 Weakness: Secondary | ICD-10-CM

## 2012-02-11 DIAGNOSIS — A0472 Enterocolitis due to Clostridium difficile, not specified as recurrent: Principal | ICD-10-CM

## 2012-02-11 DIAGNOSIS — F10129 Alcohol abuse with intoxication, unspecified: Secondary | ICD-10-CM

## 2012-02-11 DIAGNOSIS — R5381 Other malaise: Secondary | ICD-10-CM | POA: Diagnosis present

## 2012-02-11 DIAGNOSIS — N390 Urinary tract infection, site not specified: Secondary | ICD-10-CM

## 2012-02-11 DIAGNOSIS — J4489 Other specified chronic obstructive pulmonary disease: Secondary | ICD-10-CM | POA: Diagnosis present

## 2012-02-11 DIAGNOSIS — J9600 Acute respiratory failure, unspecified whether with hypoxia or hypercapnia: Secondary | ICD-10-CM

## 2012-02-11 DIAGNOSIS — D649 Anemia, unspecified: Secondary | ICD-10-CM

## 2012-02-11 DIAGNOSIS — E876 Hypokalemia: Secondary | ICD-10-CM

## 2012-02-11 DIAGNOSIS — E119 Type 2 diabetes mellitus without complications: Secondary | ICD-10-CM

## 2012-02-11 DIAGNOSIS — R6521 Severe sepsis with septic shock: Secondary | ICD-10-CM

## 2012-02-11 DIAGNOSIS — F10229 Alcohol dependence with intoxication, unspecified: Secondary | ICD-10-CM

## 2012-02-11 DIAGNOSIS — K922 Gastrointestinal hemorrhage, unspecified: Secondary | ICD-10-CM

## 2012-02-11 DIAGNOSIS — J449 Chronic obstructive pulmonary disease, unspecified: Secondary | ICD-10-CM | POA: Diagnosis present

## 2012-02-11 DIAGNOSIS — E872 Acidosis: Secondary | ICD-10-CM

## 2012-02-11 DIAGNOSIS — K219 Gastro-esophageal reflux disease without esophagitis: Secondary | ICD-10-CM | POA: Diagnosis present

## 2012-02-11 DIAGNOSIS — K2970 Gastritis, unspecified, without bleeding: Secondary | ICD-10-CM

## 2012-02-11 DIAGNOSIS — F17200 Nicotine dependence, unspecified, uncomplicated: Secondary | ICD-10-CM | POA: Diagnosis present

## 2012-02-11 DIAGNOSIS — R112 Nausea with vomiting, unspecified: Secondary | ICD-10-CM

## 2012-02-11 HISTORY — DX: Essential (primary) hypertension: I10

## 2012-02-11 LAB — URINALYSIS, ROUTINE W REFLEX MICROSCOPIC
Ketones, ur: 15 mg/dL — AB
Nitrite: NEGATIVE
Protein, ur: 100 mg/dL — AB
Urobilinogen, UA: 1 mg/dL (ref 0.0–1.0)

## 2012-02-11 LAB — CBC WITH DIFFERENTIAL/PLATELET
Basophils Absolute: 0 10*3/uL (ref 0.0–0.1)
Lymphs Abs: 2.5 10*3/uL (ref 0.7–4.0)
MCH: 32.8 pg (ref 26.0–34.0)
MCV: 94.3 fL (ref 78.0–100.0)
Monocytes Absolute: 0.2 10*3/uL (ref 0.1–1.0)
Neutrophils Relative %: 63 % (ref 43–77)
Platelets: 92 10*3/uL — ABNORMAL LOW (ref 150–400)
RDW: 13.3 % (ref 11.5–15.5)
WBC: 7.4 10*3/uL (ref 4.0–10.5)

## 2012-02-11 LAB — COMPREHENSIVE METABOLIC PANEL
Alkaline Phosphatase: 79 U/L (ref 39–117)
BUN: 16 mg/dL (ref 6–23)
Calcium: 9.1 mg/dL (ref 8.4–10.5)
Creatinine, Ser: 0.49 mg/dL — ABNORMAL LOW (ref 0.50–1.10)
GFR calc Af Amer: >90 mL/min (ref >90)
Glucose, Bld: 81 mg/dL (ref 70–99)
Potassium: 3.6 mEq/L (ref 3.5–5.1)
Total Protein: 7.3 g/dL (ref 6.0–8.3)

## 2012-02-11 LAB — URINE MICROSCOPIC-ADD ON

## 2012-02-11 LAB — CK: Total CK: 102 U/L (ref 7–177)

## 2012-02-11 MED ORDER — FOLIC ACID 1 MG PO TABS
1.0000 mg | ORAL_TABLET | Freq: Every day | ORAL | Status: DC
  Administered 2012-02-12 – 2012-02-13 (×2): 1 mg via ORAL
  Filled 2012-02-11 (×2): qty 1

## 2012-02-11 MED ORDER — VITAMIN B-1 100 MG PO TABS
100.0000 mg | ORAL_TABLET | Freq: Every day | ORAL | Status: DC
  Administered 2012-02-12 – 2012-02-13 (×2): 100 mg via ORAL
  Filled 2012-02-11 (×2): qty 1

## 2012-02-11 MED ORDER — SODIUM CHLORIDE 0.9 % IJ SOLN
3.0000 mL | Freq: Two times a day (BID) | INTRAMUSCULAR | Status: DC
  Administered 2012-02-13: 3 mL via INTRAVENOUS

## 2012-02-11 MED ORDER — LORAZEPAM 2 MG/ML IJ SOLN: 1.0000 mg | Freq: Four times a day (QID) | INTRAMUSCULAR | Status: DC | PRN

## 2012-02-11 MED ORDER — FOLIC ACID 1 MG PO TABS
1.0000 mg | ORAL_TABLET | Freq: Every day | ORAL | Status: DC
  Administered 2012-02-11: 1 mg via ORAL
  Filled 2012-02-11: qty 1

## 2012-02-11 MED ORDER — INSULIN ASPART 100 UNIT/ML ~~LOC~~ SOLN
0.0000 [IU] | SUBCUTANEOUS | Status: DC
  Administered 2012-02-12 (×3): 1 [IU] via SUBCUTANEOUS
  Administered 2012-02-12 – 2012-02-13 (×2): 2 [IU] via SUBCUTANEOUS

## 2012-02-11 MED ORDER — ONDANSETRON HCL 4 MG/2ML IJ SOLN: 4.0000 mg | Freq: Four times a day (QID) | INTRAMUSCULAR | Status: DC | PRN

## 2012-02-11 MED ORDER — ONDANSETRON HCL 4 MG PO TABS: 4.0000 mg | ORAL_TABLET | Freq: Four times a day (QID) | ORAL | Status: DC | PRN

## 2012-02-11 MED ORDER — THIAMINE HCL 100 MG/ML IJ SOLN: 100.0000 mg | Freq: Every day | INTRAMUSCULAR | Status: DC

## 2012-02-11 MED ORDER — ADULT MULTIVITAMIN W/MINERALS CH
1.0000 | ORAL_TABLET | Freq: Every day | ORAL | Status: DC
  Administered 2012-02-11: 1 via ORAL
  Filled 2012-02-11: qty 1

## 2012-02-11 MED ORDER — FOLIC ACID 1 MG PO TABS
1.0000 mg | ORAL_TABLET | Freq: Every day | ORAL | Status: DC
  Filled 2012-02-11: qty 1

## 2012-02-11 MED ORDER — LEVOTHYROXINE SODIUM 112 MCG PO TABS
112.0000 ug | ORAL_TABLET | Freq: Every day | ORAL | Status: DC
  Administered 2012-02-12 – 2012-02-13 (×2): 112 ug via ORAL
  Filled 2012-02-11 (×3): qty 1

## 2012-02-11 MED ORDER — ADULT MULTIVITAMIN W/MINERALS CH
1.0000 | ORAL_TABLET | Freq: Every day | ORAL | Status: DC
  Administered 2012-02-12 – 2012-02-13 (×2): 1 via ORAL
  Filled 2012-02-11 (×2): qty 1

## 2012-02-11 MED ORDER — LORAZEPAM 2 MG/ML IJ SOLN: 0.0000 mg | Freq: Two times a day (BID) | INTRAMUSCULAR | Status: DC

## 2012-02-11 MED ORDER — ENOXAPARIN SODIUM 40 MG/0.4ML ~~LOC~~ SOLN: 40.0000 mg | SUBCUTANEOUS | Status: DC

## 2012-02-11 MED ORDER — VITAMIN B-1 100 MG PO TABS
100.0000 mg | ORAL_TABLET | Freq: Every day | ORAL | Status: DC
  Administered 2012-02-11: 100 mg via ORAL
  Filled 2012-02-11: qty 1

## 2012-02-11 MED ORDER — LORAZEPAM 1 MG PO TABS: 1.0000 mg | ORAL_TABLET | Freq: Four times a day (QID) | ORAL | Status: DC | PRN

## 2012-02-11 MED ORDER — SODIUM CHLORIDE 0.9 % IV BOLUS (SEPSIS)
1000.0000 mL | Freq: Once | INTRAVENOUS | Status: AC
  Administered 2012-02-11: 1000 mL via INTRAVENOUS

## 2012-02-11 MED ORDER — LORAZEPAM 2 MG/ML IJ SOLN
0.0000 mg | Freq: Four times a day (QID) | INTRAMUSCULAR | Status: DC
  Filled 2012-02-11: qty 1

## 2012-02-11 MED ORDER — KCL IN DEXTROSE-NACL 20-5-0.9 MEQ/L-%-% IV SOLN
INTRAVENOUS | Status: DC
  Administered 2012-02-12 (×2): via INTRAVENOUS
  Filled 2012-02-11 (×5): qty 1000

## 2012-02-11 MED ORDER — ONDANSETRON HCL 4 MG/2ML IJ SOLN
4.0000 mg | Freq: Once | INTRAMUSCULAR | Status: AC
  Administered 2012-02-11: 4 mg via INTRAVENOUS
  Filled 2012-02-11: qty 2

## 2012-02-11 MED ORDER — ENOXAPARIN SODIUM 30 MG/0.3ML ~~LOC~~ SOLN: 30.0000 mg | SUBCUTANEOUS | Status: DC

## 2012-02-11 MED ORDER — THIAMINE HCL 100 MG/ML IJ SOLN
100.0000 mg | Freq: Every day | INTRAMUSCULAR | Status: DC
  Administered 2012-02-13: 100 mg via INTRAVENOUS
  Filled 2012-02-11 (×2): qty 1

## 2012-02-11 MED ORDER — ONDANSETRON HCL 4 MG/2ML IJ SOLN: 4.0000 mg | Freq: Three times a day (TID) | INTRAMUSCULAR | Status: AC | PRN

## 2012-02-11 NOTE — ED Provider Notes (Signed)
I saw and evaluated the patient, reviewed the resident's note and I agree with the findings and plan.   .Face to face Exam:  General:  Awake HEENT:  Atraumatic Resp:  Normal effort Abd:  Nondistended Neuro:No focal weakness Lymph: No adenopathy   Nelia Shi, MD 02/11/12 2248

## 2012-02-11 NOTE — ED Notes (Signed)
[id number]<AV> Expected date:02/11/12<BR> Expected time: 2:18 PM<BR> Means of arrival:Ambulance<BR> Comments:<BR> weakness

## 2012-02-11 NOTE — ED Notes (Signed)
Pt vomited. MD notified. Zofran ordered

## 2012-02-11 NOTE — ED Notes (Signed)
Per EMS, generalized weakness for one week-one episode of vomiting/diarrhea this am-denies pain-daughter worried about her living on her own

## 2012-02-11 NOTE — ED Provider Notes (Signed)
History     CSN: [id number]  Arrival date & time 02/11/12  1454   First MD Initiated Contact with Patient 02/11/12 1504      Chief Complaint  Patient presents with  . generalized weakness     (Consider location/radiation/quality/duration/timing/severity/associated sxs/prior treatment) Patient is a 78 y.o. female presenting with extremity weakness. The history is provided by the patient.  Extremity Weakness This is a recurrent problem. The current episode started today. The problem occurs constantly. The problem has been unchanged. Associated symptoms include congestion, coughing, nausea, vomiting and weakness. Pertinent negatives include no abdominal pain, anorexia, arthralgias, change in bowel habit, chest pain, chills, diaphoresis, fever, headaches, joint swelling, myalgias, neck pain, numbness, rash, sore throat, urinary symptoms or vertigo. The symptoms are aggravated by walking. She has tried nothing for the symptoms.  Pt reports feeling "weak" and being afraid to walk. She describes weakness in bilateral legs and has fear of falling. Lives at home alone and uses cane. She denies any numbness, dizziness, presyncope or vertigo symptoms. One episode of emesis this morning and one loose, brown stool. Pt w h/o of EtOH abuse, says she had some cocktails this am, no food. Previously seen in the ED about one month ago w similar complaint, was admitted and treated for UTI.   Past Medical History  Diagnosis Date  . Abdominal pain   . Hypothyroid   . Diabetes mellitus   . COPD (chronic obstructive pulmonary disease)   . GERD (gastroesophageal reflux disease)   . Blood transfusion   . Hypertension     Past Surgical History  Procedure Date  . Abdominal hysterectomy   . Esophagogastroduodenoscopy 05/17/2011    Procedure: ESOPHAGOGASTRODUODENOSCOPY (EGD);  Surgeon: Hart Carwin, MD;  Location: [facility] ENDOSCOPY;  Service: Endoscopy;  Laterality: N/A;    Family History  Problem Relation Age  of Onset  . Stroke Mother     History  Substance Use Topics  . Smoking status: Smoker, Current Status Unknown -- 0.5 packs/day for 30 years    Types: Cigarettes  . Smokeless tobacco: Not on file  . Alcohol Use: Yes     Uknown How much, daughter thinks she drinks heavy    OB History    Grav Para Term Preterm Abortions TAB SAB Ect Mult Living                  Review of Systems  Constitutional: Negative for fever, chills and diaphoresis.  HENT: Positive for congestion. Negative for sore throat and neck pain.   Eyes: Negative for pain and visual disturbance.  Respiratory: Positive for cough. Negative for chest tightness and shortness of breath.   Cardiovascular: Negative for chest pain and palpitations.  Gastrointestinal: Positive for nausea, vomiting and diarrhea. Negative for abdominal pain, blood in stool, anorexia and change in bowel habit.  Genitourinary: Negative for dysuria, hematuria and flank pain.  Musculoskeletal: Positive for extremity weakness. Negative for myalgias, joint swelling and arthralgias.  Skin: Negative for rash.  Neurological: Positive for weakness. Negative for vertigo, numbness and headaches.  Psychiatric/Behavioral: Negative for dysphoric mood.    Allergies  Review of patient's allergies indicates no known allergies.  Home Medications   Current Outpatient Rx  Name Route Sig Dispense Refill  . FOLIC ACID 1 MG PO TABS Oral Take 1 mg by mouth daily.    Marland Kitchen LEVOTHYROXINE SODIUM 112 MCG PO TABS Oral Take 112 mcg by mouth daily.    Marland Kitchen LORAZEPAM 0.5 MG PO TABS Oral Take 0.5  mg by mouth every 12 (twelve) hours as needed. Anxiety    . ADULT MULTIVITAMIN W/MINERALS CH Oral Take 1 tablet by mouth daily.    Marland Kitchen POTASSIUM CHLORIDE 20 MEQ PO PACK Oral Take 20 mEq by mouth 2 (two) times daily.    Marland Kitchen VITAMIN B-1 100 MG PO TABS Oral Take 100 mg by mouth daily.      BP 143/94  Pulse 76  Temp 97.3 F (36.3 C) (Oral)  Resp 18  SpO2 97%  LMP 05/17/1983  Physical  Exam  Constitutional: She is oriented to person, place, and time.       Cachectic spry female, moving avidly in the bed, talking animatedly.  HENT:  Head: Normocephalic and atraumatic.  Mouth/Throat: Oropharynx is clear and moist.  Eyes: Conjunctivae are normal. Pupils are equal, round, and reactive to light.  Neck: Normal range of motion. Neck supple. No JVD present. No thyromegaly present.  Cardiovascular: Normal rate, regular rhythm, normal heart sounds and intact distal pulses.  Exam reveals no gallop and no friction rub.   No murmur heard. Pulmonary/Chest: Effort normal. No respiratory distress. She has no wheezes. She has no rales.       Some upper airway rhonchi  Abdominal: Soft. Bowel sounds are normal. She exhibits no distension. There is no tenderness.  Musculoskeletal:       Pt walking unsteadily w wide-based gait.  5/5 strength of upper and lower extremities.  Coordination normal while seated.   Neurological: She is alert and oriented to person, place, and time. She has normal reflexes. No cranial nerve deficit.    ED Course  Procedures (including critical care time)  Labs Reviewed - No data to display No results found.   No diagnosis found.    MDM  1. Weakness of lower extremities Etiology unclear at this point, pt has h/o EtOH abuse and has been drinking cocktails this am. Could be acute intoxication vs cerebellar degeneration from chronic use. Pt had similar presentation for weakness in July and was noted to have UTI and admitted for treatment. Vitals stable and afebrile at this time.  - Will check CBC, CMet, UA, CK, and Ethanol level Bronson Curb 02/11/2012 4:19 PM   Lab work unremarkable, except for ethanol 134. Suspect unsteadiness related to intoxication. Spoke w pt's daughter Rivka Barbara, a pediatrician in [facility]. Says that EtOH abuse huge problem in mother, who lives alone and refuses home health. Daughter spoke w pt's PCP Dr. Webb Silversmith and goal  was to develop a plan to encourage pt to enter inpatient rehab for detox. Spoke w pt today and she acknowledges that she would benefit from rehab and agreed to voluntary admission to behavioral health for EtOH detox. There are currently no beds so she will be transferred to psych ED to wait for bed to open. Pt agreed to transfer to psych ED to wait for behavioral health bed to open. Pt's daughter also agrees w plan, will drive to [facility] tonight to see pt. Pt w/o SI/HI, medically stable.  Ordered CIWA protocol and put in order for transfer to psych ED. Bronson Curb 02/11/2012 7:12 PM    Pt w vomiting and large watery stool in ED room. Anion gap is 26.  Gave IVF and zofran, sent stool for C diff. Have called medicine, they will admit. Defer rehab for EtOH until acute illness resolves. Bronson Curb 02/11/2012 10:46 PM   Bronson Curb, MD 02/11/12 [phone]

## 2012-02-12 DIAGNOSIS — E119 Type 2 diabetes mellitus without complications: Secondary | ICD-10-CM

## 2012-02-12 DIAGNOSIS — E039 Hypothyroidism, unspecified: Secondary | ICD-10-CM

## 2012-02-12 LAB — BASIC METABOLIC PANEL
Calcium: 8.6 mg/dL (ref 8.4–10.5)
GFR calc non Af Amer: >90 mL/min (ref >90)
Glucose, Bld: 153 mg/dL — ABNORMAL HIGH (ref 70–99)
Sodium: 140 mEq/L (ref 135–145)

## 2012-02-12 LAB — GLUCOSE, CAPILLARY: Glucose-Capillary: 140 mg/dL — ABNORMAL HIGH (ref 70–99)

## 2012-02-12 LAB — CBC
MCH: 32.4 pg (ref 26.0–34.0)
Platelets: 69 10*3/uL — ABNORMAL LOW (ref 150–400)
RBC: 3.46 MIL/uL — ABNORMAL LOW (ref 3.87–5.11)

## 2012-02-12 LAB — TSH: TSH: 3.05 u[IU]/mL (ref 0.350–4.500)

## 2012-02-12 MED ORDER — VANCOMYCIN 50 MG/ML ORAL SOLUTION
125.0000 mg | Freq: Four times a day (QID) | ORAL | Status: DC
  Administered 2012-02-12 – 2012-02-13 (×5): 125 mg via ORAL
  Filled 2012-02-12 (×8): qty 2.5

## 2012-02-12 MED ORDER — METRONIDAZOLE 500 MG PO TABS
500.0000 mg | ORAL_TABLET | Freq: Three times a day (TID) | ORAL | Status: DC
  Filled 2012-02-12 (×5): qty 1

## 2012-02-12 MED ORDER — ENOXAPARIN SODIUM 40 MG/0.4ML ~~LOC~~ SOLN
40.0000 mg | SUBCUTANEOUS | Status: DC
  Filled 2012-02-12: qty 0.4

## 2012-02-12 NOTE — Progress Notes (Signed)
[facility] PROGRESS NOTE  [name] ZOX:[id number] DOB: Apr 10, 1934 DOA: 02/11/2012 PCP: Elby Showers, MD  Assessment/Plan: Active Problems:  C. difficile colitis  Hypothyroid  DM (diabetes mellitus)  Weakness  Alcohol abuse with intoxication  1. c-diff colitis- has been on flagyl before but did not take as she gets sick from the medication as she drinks alcohol daily 2. Alcohol abuse- she does not think she has a problem but says she will cut back 3. DM- BS 126 4. Thrombocytopenia- stable 5. Weakness- PT consult has been to SNF before, not interested 6. Hypothyroid- stable  Code Status: full Family Communication: patient at bedside Disposition Plan: home when better    HPI/Subjective: Patient doing well, wants regular food, says she only threw up once  Objective: Filed Vitals:   02/11/12 2236 02/11/12 2237 02/11/12 2330 02/12/12 0544  BP:   162/79 129/88  Pulse:   85 91  Temp: 98.7 F (37.1 C)  99.3 F (37.4 C) 98.5 F (36.9 C)  TempSrc:   Oral Oral  Resp:   18 18  Height:   4\' 9"  (1.448 m)   Weight:   46.04 kg (101 lb 8 oz)   SpO2:  93% 96% 93%    Intake/Output Summary (Last 24 hours) at 02/12/12 1127 Last data filed at 02/12/12 0500  Gross per 24 hour  Intake 428.33 ml  Output      0 ml  Net 428.33 ml   Filed Weights   02/11/12 2330  Weight: 46.04 kg (101 lb 8 oz)    Exam:   General:  A+Ox3, cooperative  Cardiovascular: rrr  Respiratory: clear  Abdomen: +BS, soft, NT/ND  Data Reviewed: Basic Metabolic Panel:  Lab 02/12/12 8119 02/11/12 1615  NA 140 145  K 3.5 3.6  CL 102 101  CO2 25 18*  GLUCOSE 153* 81  BUN 13 16  CREATININE 0.49* 0.49*  CALCIUM 8.6 9.1  MG -- --  PHOS -- --   Liver Function Tests:  Lab 02/11/12 1615  AST 84*  ALT 34  ALKPHOS 79  BILITOT 0.6  PROT 7.3  ALBUMIN 4.1   No results found for this basename: LIPASE:5,AMYLASE:5 in the last 168 hours No results found for this basename: AMMONIA:5  in the last 168 hours CBC:  Lab 02/12/12 0450 02/11/12 1615  WBC 7.1 7.4  NEUTROABS -- 4.7  HGB 11.2* 12.6  HCT 32.6* 36.2  MCV 94.2 94.3  PLT 69* 92*   Cardiac Enzymes:  Lab 02/11/12 1615  CKTOTAL 102  CKMB --  CKMBINDEX --  TROPONINI --   BNP (last 3 results) No results found for this basename: PROBNP:3 in the last 8760 hours CBG:  Lab 02/12/12 0033  GLUCAP 126*    Recent Results (from the past 240 hour(s))  CLOSTRIDIUM DIFFICILE BY PCR     Status: Abnormal   Collection Time   02/11/12 10:22 PM      Component Value Range Status Comment   C difficile by pcr POSITIVE (*) NEGATIVE Final      Studies: Dg Chest 2 View  02/11/2012  *RADIOLOGY REPORT*  Clinical Data: Cough and weakness  CHEST - 2 VIEW  Comparison: 01/03/2012  Findings: Cardiac enlargement.  Negative for heart failure. Negative for pneumonia or effusion  IMPRESSION: Cardiac enlargement.  No acute abnormality.   Original Report Authenticated By: Camelia Phenes, M.D.     Scheduled Meds:   . folic acid  1 mg Oral Daily  . insulin aspart  0-9 Units Subcutaneous Q4H  . levothyroxine  112 mcg Oral Daily  . LORazepam  0-4 mg Intravenous Q6H   Followed by  . LORazepam  0-4 mg Intravenous Q12H  . multivitamin with minerals  1 tablet Oral Daily  . ondansetron (ZOFRAN) IV  4 mg Intravenous Once  . sodium chloride  1,000 mL Intravenous Once  . sodium chloride  3 mL Intravenous Q12H  . thiamine  100 mg Oral Daily   Or  . thiamine  100 mg Intravenous Daily  . vancomycin  125 mg Oral Q6H  . DISCONTD: enoxaparin (LOVENOX) injection  30 mg Subcutaneous Q24H  . DISCONTD: enoxaparin (LOVENOX) injection  40 mg Subcutaneous Q24H  . DISCONTD: enoxaparin (LOVENOX) injection  40 mg Subcutaneous Q24H  . DISCONTD: folic acid  1 mg Oral Daily  . DISCONTD: folic acid  1 mg Oral Daily  . DISCONTD: metroNIDAZOLE  500 mg Oral Q8H  . DISCONTD: multivitamin with minerals  1 tablet Oral Daily  . DISCONTD: thiamine  100 mg  Intravenous Daily  . DISCONTD: thiamine  100 mg Oral Daily   Continuous Infusions:   . dextrose 5 % and 0.9 % NaCl with KCl 20 mEq/L 100 mL/hr at 02/12/12 4098    Active Problems:  C. difficile colitis  Hypothyroid  DM (diabetes mellitus)  Weakness  Alcohol abuse with intoxication    Time spent: 35 min    Gaylon Melchor  [facility] Pager [phone].  02/12/2012, 11:27 AM  LOS: 1 day

## 2012-02-12 NOTE — Progress Notes (Signed)
As per lab, patient tested positive for cdiff.  Dr. Benjamine Mola aware.  Patient remains on isolation.  Will continue to monitor.

## 2012-02-12 NOTE — Progress Notes (Signed)
Clinical Social Work Department BRIEF PSYCHOSOCIAL ASSESSMENT 02/12/2012  Patient:  [name], [name]     Account Number:  [id number]     Admit date:  02/11/2012  Clinical Social Worker:  Doroteo Glassman  Date/Time:  02/12/2012 11:37 AM  Referred by:  Physician  Date Referred:  02/12/2012 Referred for  Substance Abuse   Other Referral:   Interview type:  Patient Other interview type:   none    PSYCHOSOCIAL DATA Living Status:  ALONE Admitted from facility:   Level of care:   Primary support name:  Alfredo Batty Primary support relationship to patient:  CHILD, ADULT Degree of support available:   unknown    CURRENT CONCERNS Current Concerns  Substance Abuse   Other Concerns:    SOCIAL WORK ASSESSMENT / PLAN Met with Pt to discuss ETOH use.    Pt admits to drinking 2 glasses of Scotch per day, 1 in a.m and 1 in p.m.  Pt states that she doesn't have a px with ETOH and reports that the reason that her level was so high was because she had a drink prior to presenting to the ED.    Pt states that she recognizes that she needs to cut back due to her health and states her intent to cut back to 1 drink per day.  She is adamant that she will not stop drinking all together, as she says the alcohol works better, at times, than her medication.    MD entered the room and was privvy to the conversation.    No further CSW needs identified.    CSW to sign off.   Assessment/plan status:  No Further Intervention Required Other assessment/ plan:   Information/referral to community resources:   declined    PATIENT'S/FAMILY'S RESPONSE TO PLAN OF CARE: Pt thanked CSW for time and assistance.   Providence Crosby, LCSWA Clinical Social Work [phone]

## 2012-02-12 NOTE — H&P (Signed)
[facility] History and Physical  [name]:[id number] DOB: [birth year]    PCP:   Elby Showers, MD   Chief Complaint: brought in to the ER for weakness.  She is intoxicated and wishes for detox rehab.  HPI: [name] is an 78 y.o. female with history of alcoholic abuse, intoxicated, hx of diarrhea with prior recent hx of C diff colitis, hx of tobacco abuse, DM, Hypothyroidism, COPD, HTN, brought into the ER by her daughter, a pediatrician, as she has been weak, having nausea, vomiting and diarrhea, and would like to be detox.  In the ER, she has persistent nausea, vomited once, and was dehydrated, so hospitalist was asked to admit her for further treatment of her symptoms prior to transferring her to detox center.  Rewiew of Systems:  Constitutional: Negative for malaise, fever and chills. No significant weight loss or weight gain Eyes: Negative for eye pain, redness and discharge, diplopia, visual changes, or flashes of light. ENMT: Negative for ear pain, hoarseness, nasal congestion, sinus pressure and sore throat. No headaches; tinnitus, drooling, or problem swallowing. Cardiovascular: Negative for chest pain, palpitations, diaphoresis, dyspnea and peripheral edema. ; No orthopnea, PND Respiratory: Negative for cough, hemoptysis, wheezing and stridor. No pleuritic chestpain. Gastrointestinal: Negative for constipation, abdominal pain, melena, blood in stool, hematemesis, jaundice and rectal bleeding.    Genitourinary: Negative for frequency, dysuria, incontinence,flank pain and hematuria; Musculoskeletal: Negative for back pain and neck pain. Negative for swelling and trauma.;  Skin: . Negative for pruritus, rash, abrasions, bruising and skin lesion.; ulcerations Neuro: Negative for headache, lightheadedness and neck stiffness. Negative for weakness, altered level of consciousness , altered mental status, extremity weakness, burning feet, involuntary movement,  seizure and syncope.  Psych: negative for anxiety, insomnia, tearfulness, panic attacks, hallucinations, paranoia, suicidal or homicidal ideation    Past Medical History  Diagnosis Date  . Abdominal pain   . Hypothyroid   . Diabetes mellitus   . COPD (chronic obstructive pulmonary disease)   . GERD (gastroesophageal reflux disease)   . Blood transfusion   . Hypertension     Past Surgical History  Procedure Date  . Abdominal hysterectomy   . Esophagogastroduodenoscopy 05/17/2011    Procedure: ESOPHAGOGASTRODUODENOSCOPY (EGD);  Surgeon: Hart Carwin, MD;  Location: [facility] ENDOSCOPY;  Service: Endoscopy;  Laterality: N/A;    Medications:  HOME MEDS: Prior to Admission medications   Medication Sig Start Date End Date Taking? Authorizing Provider  folic acid (FOLVITE) 1 MG tablet Take 1 mg by mouth daily.   Yes Historical Provider, MD  levothyroxine (SYNTHROID, LEVOTHROID) 112 MCG tablet Take 112 mcg by mouth daily.   Yes Historical Provider, MD  LORazepam (ATIVAN) 0.5 MG tablet Take 0.5 mg by mouth every 12 (twelve) hours as needed. Anxiety   Yes Historical Provider, MD  Multiple Vitamin (MULTIVITAMIN WITH MINERALS) TABS Take 1 tablet by mouth daily.   Yes Historical Provider, MD  potassium chloride (KLOR-CON) 20 MEQ packet Take 20 mEq by mouth 2 (two) times daily.   Yes Historical Provider, MD  thiamine (VITAMIN B-1) 100 MG tablet Take 100 mg by mouth daily.   Yes Historical Provider, MD     Allergies:  No Known Allergies  Social History:   reports that she has been smoking Cigarettes.  She has a 15 pack-year smoking history. She does not have any smokeless tobacco history on file. She reports that she drinks alcohol. She reports that she does not use illicit drugs.  Family History: Family History  Problem Relation Age of Onset  . Stroke Mother      Physical Exam: Filed Vitals:   02/11/12 2233 02/11/12 2236 02/11/12 2237 02/11/12 2330  BP: 165/82   162/79  Pulse: 88    85  Temp:  98.7 F (37.1 C)  99.3 F (37.4 C)  TempSrc:    Oral  Resp:    18  Height:    4\' 9"  (1.448 m)  Weight:    46.04 kg (101 lb 8 oz)  SpO2: 90%  93% 96%   Blood pressure 162/79, pulse 85, temperature 99.3 F (37.4 C), temperature source Oral, resp. rate 18, height 4\' 9"  (1.448 m), weight 46.04 kg (101 lb 8 oz), last menstrual period 05/17/1983, SpO2 96.00%.  GEN:  Pleasant  patient lying in the stretcher in no acute distress; cooperative with exam. PSYCH:  alert and oriented x4; does not appear anxious or depressed; affect is appropriate. HEENT: Mucous membranes pink and anicteric; PERRLA; EOM intact; no cervical lymphadenopathy nor thyromegaly or carotid bruit; no JVD; There were no stridor. Neck is very supple. Breasts:: Not examined CHEST WALL: No tenderness CHEST: Normal respiration, clear to auscultation bilaterally.  HEART: Regular rate and rhythm.  There are no murmur, rub, or gallops.   BACK: No kyphosis or scoliosis; no CVA tenderness ABDOMEN: soft and non-tender; no masses, no organomegaly, normal abdominal bowel sounds; no pannus; no intertriginous candida. There is no rebound and no distention. Rectal Exam: Not done EXTREMITIES: No bone or joint deformity; age-appropriate arthropathy of the hands and knees; no edema; no ulcerations.  There is no calf tenderness. Genitalia: not examined PULSES: 2+ and symmetric SKIN: Normal hydration no rash or ulceration CNS: Cranial nerves 2-12 grossly intact no focal lateralizing neurologic deficit.  Speech is fluent; uvula elevated with phonation, facial symmetry and tongue midline. DTR are normal bilaterally, cerebella exam is intact, barbinski is negative and strengths are equaled bilaterally.  No sensory loss.   Labs on Admission:  Basic Metabolic Panel:  Lab 02/11/12 8295  NA 145  K 3.6  CL 101  CO2 18*  GLUCOSE 81  BUN 16  CREATININE 0.49*  CALCIUM 9.1  MG --  PHOS --   Liver Function Tests:  Lab 02/11/12 1615    AST 84*  ALT 34  ALKPHOS 79  BILITOT 0.6  PROT 7.3  ALBUMIN 4.1   No results found for this basename: LIPASE:5,AMYLASE:5 in the last 168 hours No results found for this basename: AMMONIA:5 in the last 168 hours CBC:  Lab 02/11/12 1615  WBC 7.4  NEUTROABS 4.7  HGB 12.6  HCT 36.2  MCV 94.3  PLT 92*   Cardiac Enzymes:  Lab 02/11/12 1615  CKTOTAL 102  CKMB --  CKMBINDEX --  TROPONINI --    CBG: No results found for this basename: GLUCAP:5 in the last 168 hours   Radiological Exams on Admission: Dg Chest 2 View  02/11/2012  *RADIOLOGY REPORT*  Clinical Data: Cough and weakness  CHEST - 2 VIEW  Comparison: 01/03/2012  Findings: Cardiac enlargement.  Negative for heart failure. Negative for pneumonia or effusion  IMPRESSION: Cardiac enlargement.  No acute abnormality.   Original Report Authenticated By: Camelia Phenes, M.D.        Assessment/Plan Present on Admission:  .Alcohol abuse with intoxication .C. difficile colitis .Hypothyroid .DM (diabetes mellitus) .Weakness Nausea and vomiting.   PLAN:  Will admit her to telemetry for symptomatic treatment.  Since she was recently treated for C diff, and  having persistent diarrhea, it is not uncommon to have persistent C diff infection, so I will emperically put her on Flagyl pending repeat C diff PCR.  For her hypothyroidism, will continue synthroid and recheck TSH.  She is at risk for alcohol withdrawal, so will place her on telemetry and start CIWA protocol.  Will place her on NPO, and use SSI if her BS is too elevated.  She is stable, full code, and will be admitted to [facility] service.  Other plans as per orders.  Code Status: FULL Unk Lightning, MD. [facility] Pager [phone] 7pm to 7am.  02/12/2012, 12:52 AM

## 2012-02-13 DIAGNOSIS — R112 Nausea with vomiting, unspecified: Secondary | ICD-10-CM

## 2012-02-13 LAB — URINE CULTURE

## 2012-02-13 LAB — GLUCOSE, CAPILLARY: Glucose-Capillary: 181 mg/dL — ABNORMAL HIGH (ref 70–99)

## 2012-02-13 MED ORDER — VANCOMYCIN 50 MG/ML ORAL SOLUTION: 125.0000 mg | Freq: Four times a day (QID) | ORAL | Status: AC

## 2012-02-13 NOTE — Progress Notes (Signed)
CSW notified by nurse requesting a meeting with the Pt, MD, Nurse, and CSW at 3pm to discuss Pt needs.   CSW to follow.  Leron Croak, LCSWA [employer] [phone]

## 2012-02-13 NOTE — Evaluation (Signed)
Physical Therapy Evaluation Patient Details Name: [name] MRN: [id number] DOB: 01/22/1934 Today's Date: 02/13/2012 Time: 4098-1191 PT Time Calculation (min): 15 min  PT Assessment / Plan / Recommendation Clinical Impression  Pt. was admitted w/ alcohol related issues, N/V/diarrhea, need for detox. Pt. now not wanting placement. Pt. did have unsteady gait w/ RW, L knee buckled during stance several times. Pt. will benefit from PT to improve safety and functional independence to DC to home . No family  is present  to discuss plan. Pt has DME.     PT Assessment  Patient needs continued PT services    Follow Up Recommendations  No PT follow up (pt. declined.)    Barriers to Discharge Decreased caregiver support      Equipment Recommendations  None recommended by PT    Recommendations for Other Services     Frequency Min 3X/week    Precautions / Restrictions Precautions Precautions: Fall   Pertinent Vitals/Pain No c/o      Mobility  Bed Mobility Bed Mobility: Supine to Sit;Sit to Supine Supine to Sit: 5: Supervision;With rails;HOB elevated Sit to Supine: 5: Supervision Details for Bed Mobility Assistance: Pt. takes extra time to move to edge, pt is short in stature Transfers Transfers: Sit to Stand;Stand to Sit;Stand Pivot Transfers Sit to Stand: From bed;From chair/3-in-1;With upper extremity assist;4: Min guard Stand to Sit: To chair/3-in-1;4: Min guard Stand Pivot Transfers: 4: Min guard Details for Transfer Assistance: pt. did not want to stay in recliner. Ambulation/Gait Ambulation/Gait Assistance: 4: Min assist Ambulation Distance (Feet): 60 Feet Assistive device: Rolling walker Ambulation/Gait Assistance Details: Pt's L knee buckled several times, not to point of fall, just quickly. Gait Pattern: Step-through pattern;Decreased stance time - right;Decreased stride length Gait velocity: decreased    Exercises     PT Diagnosis: Difficulty  walking;Abnormality of gait  PT Problem List: Decreased strength;Decreased knowledge of use of DME;Decreased knowledge of precautions;Decreased safety awareness;Decreased activity tolerance;Decreased mobility PT Treatment Interventions: DME instruction;Gait training;Functional mobility training;Therapeutic activities;Patient/family education   PT Goals Acute Rehab PT Goals PT Goal Formulation: With patient Time For Goal Achievement: 02/27/12 Potential to Achieve Goals: Good Pt will go Supine/Side to Sit: Independently PT Goal: Supine/Side to Sit - Progress: Goal set today Pt will go Sit to Supine/Side: Independently PT Goal: Sit to Supine/Side - Progress: Goal set today Pt will go Sit to Stand: with modified independence PT Goal: Sit to Stand - Progress: Goal set today Pt will go Stand to Sit: Independently PT Goal: Stand to Sit - Progress: Goal set today Pt will Ambulate: 51 - 150 feet;with modified independence;with rolling walker PT Goal: Ambulate - Progress: Goal set today  Visit Information  Last PT Received On: 02/13/12 Assistance Needed: +1    Subjective Data  Subjective: my daughter is coming to pick me up Patient Stated Goal: to go home   Prior Functioning  Home Living Lives With: Alone Available Help at Discharge: Family Type of Home: House Home Access: Stairs to enter Secretary/administrator of Steps: 1 Home Layout: One level Bathroom Shower/Tub: Engineer, manufacturing systems: Standard Home Adaptive Equipment: Bedside commode/3-in-1;Walker - rolling;Straight cane;Quad cane;Wheelchair - manual Prior Function Level of Independence: Needs assistance Needs Assistance: Bathing;Meal Prep (meals on wheels) Meal Prep:  (meals on wheels) Driving: Yes Comments: Pt states daughter helps as needed Communication Communication: No difficulties    Cognition  Overall Cognitive Status: Impaired Area of Impairment: Attention;Safety/judgement;Awareness of  errors Arousal/Alertness: Awake/alert Behavior During Session: Restless Current Attention Level:  Selective Safety/Judgement: Decreased awareness of safety precautions Cognition - Other Comments: impulsive    Extremity/Trunk Assessment Right Upper Extremity Assessment RUE ROM/Strength/Tone: WFL for tasks assessed Left Upper Extremity Assessment LUE ROM/Strength/Tone: WFL for tasks assessed Right Lower Extremity Assessment RLE ROM/Strength/Tone: WFL for tasks assessed Left Lower Extremity Assessment LLE ROM/Strength/Tone: WFL for tasks assessed Trunk Assessment Trunk Assessment: Normal   Balance    End of Session PT - End of Session Activity Tolerance: Patient tolerated treatment well;Patient limited by fatigue Patient left: in bed;with call bell/phone within reach;with bed alarm set Nurse Communication: Mobility status  GP     Rada Hay 02/13/2012, 3:07 PM  [phone]

## 2012-02-13 NOTE — Progress Notes (Signed)
CSW provided Pt with substance abuse information for both OP and INPT programs.  Pt stated that she will accept the information, however does not feel as though she is ready for assistance with her addiction at this time. Pt stated that if she feels that she is ready in the future she will use information given.   CSW signing off.   Leron Croak, LCSWA [employer] [phone]

## 2012-02-13 NOTE — Consult Note (Signed)
Reason for Consult:alcohol abuse vs dependence and capacity evaluation. Referring Physician: Dr. Ronni Rumble [name] is an 78 y.o. female.  HPI: Patient was seen and chart reviewed. Patient daughter and grand daughter was at bed side. Patient endorses using alcohol limited quantity regularly and denied withdrawal symptoms at any time. She stated that drinking makes her feel good and relaxed. he has no previous history of alcohol or drug of abuse. She was placed in skilled nursing home in the past but now stays by herself and not willing to give up her freedom. She was educated and worked as Chief Financial Officer prior to retired. She has fairly good orientation 9/10, concentration 5/5 , registration and recall 3/3, poor recall 1/3, concrete proverb interpretation and ability recognize objects. She has fund of knowledge. She acknowledge her health problems and agree with her treatment recommendations. She denied being depressed, anxious, mania or psychosis.   Past Medical History  Diagnosis Date  . Abdominal pain   . Hypothyroid   . Diabetes mellitus   . COPD (chronic obstructive pulmonary disease)   . GERD (gastroesophageal reflux disease)   . Blood transfusion   . Hypertension     Past Surgical History  Procedure Date  . Abdominal hysterectomy   . Esophagogastroduodenoscopy 05/17/2011    Procedure: ESOPHAGOGASTRODUODENOSCOPY (EGD);  Surgeon: Hart Carwin, MD;  Location: [facility] ENDOSCOPY;  Service: Endoscopy;  Laterality: N/A;    Family History  Problem Relation Age of Onset  . Stroke Mother     Social History:  reports that she has been smoking Cigarettes.  She has a 15 pack-year smoking history. She does not have any smokeless tobacco history on file. She reports that she drinks alcohol. She reports that she does not use illicit drugs.  Allergies: No Known Allergies  Medications: I have reviewed the patient's current medications.  Results for orders placed during the hospital  encounter of 02/11/12 (from the past 48 hour(s))  CLOSTRIDIUM DIFFICILE BY PCR     Status: Abnormal   Collection Time   02/11/12 10:22 PM      Component Value Range Comment   C difficile by pcr POSITIVE (*) NEGATIVE   GLUCOSE, CAPILLARY     Status: Abnormal   Collection Time   02/12/12 12:33 AM      Component Value Range Comment   Glucose-Capillary 126 (*) 70 - 99 mg/dL    Comment 1 Notify RN     GLUCOSE, CAPILLARY     Status: Abnormal   Collection Time   02/12/12  4:18 AM      Component Value Range Comment   Glucose-Capillary 162 (*) 70 - 99 mg/dL    Comment 1 Notify RN     BASIC METABOLIC PANEL     Status: Abnormal   Collection Time   02/12/12  4:50 AM      Component Value Range Comment   Sodium 140  135 - 145 mEq/L    Potassium 3.5  3.5 - 5.1 mEq/L    Chloride 102  96 - 112 mEq/L    CO2 25  19 - 32 mEq/L    Glucose, Bld 153 (*) 70 - 99 mg/dL    BUN 13  6 - 23 mg/dL    Creatinine, Ser 1.61 (*) 0.50 - 1.10 mg/dL    Calcium 8.6  8.4 - 09.6 mg/dL    GFR calc non Af Amer >90  >90 mL/min    GFR calc Af Amer >90  >90 mL/min  CBC     Status: Abnormal   Collection Time   02/12/12  4:50 AM      Component Value Range Comment   WBC 7.1  4.0 - 10.5 K/uL    RBC 3.46 (*) 3.87 - 5.11 MIL/uL    Hemoglobin 11.2 (*) 12.0 - 15.0 g/dL    HCT 40.9 (*) 81.1 - 46.0 %    MCV 94.2  78.0 - 100.0 fL    MCH 32.4  26.0 - 34.0 pg    MCHC 34.4  30.0 - 36.0 g/dL    RDW 91.4  78.2 - 95.6 %    Platelets 69 (*) 150 - 400 K/uL   GLUCOSE, CAPILLARY     Status: Abnormal   Collection Time   02/12/12  8:09 AM      Component Value Range Comment   Glucose-Capillary 140 (*) 70 - 99 mg/dL    Comment 1 Notify RN     GLUCOSE, CAPILLARY     Status: Abnormal   Collection Time   02/12/12 11:49 AM      Component Value Range Comment   Glucose-Capillary 140 (*) 70 - 99 mg/dL    Comment 1 Notify RN     GLUCOSE, CAPILLARY     Status: Abnormal   Collection Time   02/12/12  4:52 PM      Component Value Range Comment     Glucose-Capillary 183 (*) 70 - 99 mg/dL    Comment 1 Notify RN     GLUCOSE, CAPILLARY     Status: Abnormal   Collection Time   02/13/12  7:59 AM      Component Value Range Comment   Glucose-Capillary 181 (*) 70 - 99 mg/dL   GLUCOSE, CAPILLARY     Status: Abnormal   Collection Time   02/13/12 11:21 AM      Component Value Range Comment   Glucose-Capillary 131 (*) 70 - 99 mg/dL     Dg Chest 2 View  08/05/863  *RADIOLOGY REPORT*  Clinical Data: Cough and weakness  CHEST - 2 VIEW  Comparison: 01/03/2012  Findings: Cardiac enlargement.  Negative for heart failure. Negative for pneumonia or effusion  IMPRESSION: Cardiac enlargement.  No acute abnormality.   Original Report Authenticated By: Camelia Phenes, M.D.     No depression, No anxiety, No psychosis and Positive for excessive alcohol consumption and sleep disturbance Blood pressure 136/80, pulse 78, temperature 98.1 F (36.7 C), temperature source Oral, resp. rate 18, height 4\' 9"  (1.448 m), weight 101 lb 8 oz (46.04 kg), last menstrual period 05/17/1983, SpO2 98.00%.   Assessment/Plan: Alcohol abuse vs dependence   Recommendation: Patient has capacity to make treatment decisions and does not meet criteria for inpatient alcohol detox or rehab services. No further recomedations. Appreciate psych consult on this patient.  Ferol Laiche,JANARDHAHA R. 02/13/2012, 4:27 PM

## 2012-02-13 NOTE — Progress Notes (Signed)
CSW met with the Pt prior to meeting.   Pt was cognitively intact and aware that daughter was not comfortable with current d/c plan. Daughter has requested a Psych evaluation prior to d/c.   MD aware and awaiting psych eval recommendation for d/c planning.   CSW to follow.   [name], LCSWA [employer] [phone]

## 2012-02-13 NOTE — Progress Notes (Signed)
Patient refused CBG's every hour. Patient stated that her glucose has been stable and she will discuss the CBG's with the doctor today.  Patient refused schedule ativan. Patient stated that she does not need it.

## 2012-02-13 NOTE — Progress Notes (Signed)
Pt refused her insulin at 1200 stating "I don't take insulin at home".

## 2012-02-16 NOTE — Discharge Summary (Signed)
Physician Discharge Summary  [name] NWG:[id number] DOB: 1933/08/12 DOA: 02/11/2012  PCP: Elby Showers, MD  Admit date: 02/11/2012 Discharge date: 02/16/2012  Recommendations for Outpatient Follow-up:  1. ?APS referral - discussed with daughter 2. Refused home health and refused rehab  Discharge Diagnoses:  Active Problems:  C. difficile colitis  Hypothyroid  DM (diabetes mellitus)  Weakness  Alcohol abuse with intoxication   Discharge Condition: stable  Diet recommendation: cardiac/diabetic  Filed Weights   02/11/12 2330  Weight: 46.04 kg (101 lb 8 oz)    History of present illness:  [name] is an 78 y.o. female with history of alcoholic abuse, intoxicated, hx of diarrhea with prior recent hx of C diff colitis, hx of tobacco abuse, DM, Hypothyroidism, COPD, HTN, brought into the ER by her daughter, a pediatrician, as she has been weak, having nausea, vomiting and diarrhea, and would like to be detox. In the ER, she has persistent nausea, vomited once, and was dehydrated, so hospitalist was asked to admit her for further treatment of her symptoms prior to transferring her to detox center.   Hospital Course:  1. c-diff colitis- has been on flagyl before but did not take as she gets sick from the medication as she drinks alcohol daily, PO vanc for 2 weeks 2. Alcohol abuse- she does not think she has a problem but says she will cut back and is not interested in rehab- discussed at length with patient's daughter, has capacity per psych 3. DM- BS 126, controlled 4. Thrombocytopenia- stable 5. Weakness- PT consult has been to SNF before, not interested 6. Hypothyroid- stable  Consultations:  psych  Discharge Exam: Filed Vitals:   02/13/12 1429  BP: 136/80  Pulse: 78  Temp: 98.1 F (36.7 C)  Resp: 18   Filed Vitals:   02/12/12 2100 02/13/12 0543 02/13/12 1200 02/13/12 1429  BP: 135/72 143/70 138/76 136/80  Pulse: 82 67 75 78  Temp: 99.6 F (37.6  C) 98.6 F (37 C)  98.1 F (36.7 C)  TempSrc: Oral Oral  Oral  Resp: 18 18  18   Height:      Weight:      SpO2: 97% 98%  98%    General: A+Ox3, NAD- insisting on going home Cardiovascular: rrr Respiratory: clear anterior  Discharge Instructions  Discharge Orders    Future Orders Please Complete By Expires   Diet Carb Modified      Increase activity slowly      Discharge instructions      Comments:   Avoid alcohol -given information about treatment programs outpatient   Driving Restrictions      Comments:   No driving while still consuming alcohol     Medication List  As of 02/16/2012  1:00 PM   TAKE these medications         folic acid 1 MG tablet   Commonly known as: FOLVITE   Take 1 mg by mouth daily.      levothyroxine 112 MCG tablet   Commonly known as: SYNTHROID, LEVOTHROID   Take 112 mcg by mouth daily.      LORazepam 0.5 MG tablet   Commonly known as: ATIVAN   Take 0.5 mg by mouth every 12 (twelve) hours as needed. Anxiety      multivitamin with minerals Tabs   Take 1 tablet by mouth daily.      potassium chloride 20 MEQ packet   Commonly known as: KLOR-CON   Take 20 mEq by mouth 2 (  two) times daily.      thiamine 100 MG tablet   Commonly known as: VITAMIN B-1   Take 100 mg by mouth daily.      vancomycin 50 mg/mL oral solution   Commonly known as: VANCOCIN   Take 2.5 mLs (125 mg total) by mouth every 6 (six) hours.           Follow-up Information    Follow up with [facility], MD in 2 weeks.   Contact information:   [address] [place] [address] [place] [address] [phone]           The results of significant diagnostics from this hospitalization (including imaging, microbiology, ancillary and laboratory) are listed below for reference.    Significant Diagnostic Studies: Dg Chest 2 View  02/11/2012  *RADIOLOGY REPORT*  Clinical Data: Cough and weakness  CHEST - 2 VIEW  Comparison: 01/03/2012  Findings:  Cardiac enlargement.  Negative for heart failure. Negative for pneumonia or effusion  IMPRESSION: Cardiac enlargement.  No acute abnormality.   Original Report Authenticated By: Camelia Phenes, M.D.     Microbiology: Recent Results (from the past 240 hour(s))  URINE CULTURE     Status: Normal   Collection Time   02/11/12  4:24 PM      Component Value Range Status Comment   Specimen Description URINE, CLEAN CATCH   Final    Special Requests NONE   Final    Culture  Setup Time 02/12/2012 03:29   Final    Colony Count >=100,000 COLONIES/ML   Final    Culture Vicenta Aly   Final    Report Status 02/13/2012 FINAL   Final    Organism ID, Bacteria CITROBACTER YOUNGAE   Final   CLOSTRIDIUM DIFFICILE BY PCR     Status: Abnormal   Collection Time   02/11/12 10:22 PM      Component Value Range Status Comment   C difficile by pcr POSITIVE (*) NEGATIVE Final      Labs: Basic Metabolic Panel:  Lab 02/12/12 4696 02/11/12 1615  NA 140 145  K 3.5 3.6  CL 102 101  CO2 25 18*  GLUCOSE 153* 81  BUN 13 16  CREATININE 0.49* 0.49*  CALCIUM 8.6 9.1  MG -- --  PHOS -- --   Liver Function Tests:  Lab 02/11/12 1615  AST 84*  ALT 34  ALKPHOS 79  BILITOT 0.6  PROT 7.3  ALBUMIN 4.1   No results found for this basename: LIPASE:5,AMYLASE:5 in the last 168 hours No results found for this basename: AMMONIA:5 in the last 168 hours CBC:  Lab 02/12/12 0450 02/11/12 1615  WBC 7.1 7.4  NEUTROABS -- 4.7  HGB 11.2* 12.6  HCT 32.6* 36.2  MCV 94.2 94.3  PLT 69* 92*   Cardiac Enzymes:  Lab 02/11/12 1615  CKTOTAL 102  CKMB --  CKMBINDEX --  TROPONINI --   BNP: BNP (last 3 results) No results found for this basename: PROBNP:3 in the last 8760 hours CBG:  Lab 02/13/12 1121 02/13/12 0759 02/12/12 1652 02/12/12 1149 02/12/12 0809  GLUCAP 131* 181* 183* 140* 140*    Time coordinating discharge: 45 minutes  Signed:  Benjamine Mola, JESSICA  [facility] 02/16/2012, 1:00 PM

## 2012-07-18 ENCOUNTER — Inpatient Hospital Stay (HOSPITAL_COMMUNITY)
Admission: EM | Admit: 2012-07-18 | Discharge: 2012-07-21 | DRG: 690 | Disposition: A | Discharge status: Home / Self Care | Payer: Medicare Other | Attending: Internal Medicine | Admitting: Internal Medicine

## 2012-07-18 ENCOUNTER — Emergency Department (INDEPENDENT_AMBULATORY_CARE_PROVIDER_SITE_OTHER)
Admission: EM | Admit: 2012-07-18 | Discharge: 2012-07-18 | Disposition: A | Discharge status: Home / Self Care | Payer: Medicare Other | Source: Home / Self Care | Attending: Family Medicine | Admitting: Family Medicine

## 2012-07-18 ENCOUNTER — Encounter (HOSPITAL_COMMUNITY): Payer: Self-pay | Admitting: Emergency Medicine

## 2012-07-18 ENCOUNTER — Emergency Department (HOSPITAL_COMMUNITY): Payer: Medicare Other

## 2012-07-18 DIAGNOSIS — K922 Gastrointestinal hemorrhage, unspecified: Secondary | ICD-10-CM

## 2012-07-18 DIAGNOSIS — I10 Essential (primary) hypertension: Secondary | ICD-10-CM | POA: Diagnosis present

## 2012-07-18 DIAGNOSIS — R531 Weakness: Secondary | ICD-10-CM | POA: Diagnosis present

## 2012-07-18 DIAGNOSIS — Z91199 Patient's noncompliance with other medical treatment and regimen due to unspecified reason: Secondary | ICD-10-CM

## 2012-07-18 DIAGNOSIS — K2970 Gastritis, unspecified, without bleeding: Secondary | ICD-10-CM

## 2012-07-18 DIAGNOSIS — F10229 Alcohol dependence with intoxication, unspecified: Secondary | ICD-10-CM

## 2012-07-18 DIAGNOSIS — F17200 Nicotine dependence, unspecified, uncomplicated: Secondary | ICD-10-CM | POA: Diagnosis present

## 2012-07-18 DIAGNOSIS — E039 Hypothyroidism, unspecified: Secondary | ICD-10-CM | POA: Diagnosis present

## 2012-07-18 DIAGNOSIS — J4489 Other specified chronic obstructive pulmonary disease: Secondary | ICD-10-CM | POA: Diagnosis present

## 2012-07-18 DIAGNOSIS — D696 Thrombocytopenia, unspecified: Secondary | ICD-10-CM | POA: Diagnosis present

## 2012-07-18 DIAGNOSIS — E876 Hypokalemia: Secondary | ICD-10-CM | POA: Diagnosis present

## 2012-07-18 DIAGNOSIS — K700 Alcoholic fatty liver: Secondary | ICD-10-CM

## 2012-07-18 DIAGNOSIS — E781 Pure hyperglyceridemia: Secondary | ICD-10-CM

## 2012-07-18 DIAGNOSIS — R627 Adult failure to thrive: Secondary | ICD-10-CM | POA: Diagnosis present

## 2012-07-18 DIAGNOSIS — A419 Sepsis, unspecified organism: Secondary | ICD-10-CM

## 2012-07-18 DIAGNOSIS — R5383 Other fatigue: Secondary | ICD-10-CM | POA: Diagnosis present

## 2012-07-18 DIAGNOSIS — A0472 Enterocolitis due to Clostridium difficile, not specified as recurrent: Secondary | ICD-10-CM

## 2012-07-18 DIAGNOSIS — Z9119 Patient's noncompliance with other medical treatment and regimen: Secondary | ICD-10-CM

## 2012-07-18 DIAGNOSIS — N179 Acute kidney failure, unspecified: Secondary | ICD-10-CM

## 2012-07-18 DIAGNOSIS — R5381 Other malaise: Secondary | ICD-10-CM

## 2012-07-18 DIAGNOSIS — E860 Dehydration: Secondary | ICD-10-CM | POA: Diagnosis present

## 2012-07-18 DIAGNOSIS — J449 Chronic obstructive pulmonary disease, unspecified: Secondary | ICD-10-CM | POA: Diagnosis present

## 2012-07-18 DIAGNOSIS — K219 Gastro-esophageal reflux disease without esophagitis: Secondary | ICD-10-CM | POA: Diagnosis present

## 2012-07-18 DIAGNOSIS — R4182 Altered mental status, unspecified: Secondary | ICD-10-CM

## 2012-07-18 DIAGNOSIS — G9340 Encephalopathy, unspecified: Secondary | ICD-10-CM

## 2012-07-18 DIAGNOSIS — R9431 Abnormal electrocardiogram [ECG] [EKG]: Secondary | ICD-10-CM | POA: Diagnosis present

## 2012-07-18 DIAGNOSIS — D649 Anemia, unspecified: Secondary | ICD-10-CM | POA: Diagnosis present

## 2012-07-18 DIAGNOSIS — E119 Type 2 diabetes mellitus without complications: Secondary | ICD-10-CM | POA: Diagnosis present

## 2012-07-18 DIAGNOSIS — N390 Urinary tract infection, site not specified: Principal | ICD-10-CM | POA: Diagnosis present

## 2012-07-18 DIAGNOSIS — F10129 Alcohol abuse with intoxication, unspecified: Secondary | ICD-10-CM

## 2012-07-18 DIAGNOSIS — F10239 Alcohol dependence with withdrawal, unspecified: Secondary | ICD-10-CM

## 2012-07-18 DIAGNOSIS — J9600 Acute respiratory failure, unspecified whether with hypoxia or hypercapnia: Secondary | ICD-10-CM

## 2012-07-18 DIAGNOSIS — F1020 Alcohol dependence, uncomplicated: Secondary | ICD-10-CM | POA: Diagnosis present

## 2012-07-18 HISTORY — DX: Disease of blood and blood-forming organs, unspecified: D75.9

## 2012-07-18 HISTORY — DX: Urinary tract infection, site not specified: N39.0

## 2012-07-18 LAB — URINALYSIS, ROUTINE W REFLEX MICROSCOPIC
Ketones, ur: 40 mg/dL — AB
Nitrite: NEGATIVE
Protein, ur: 30 mg/dL — AB
Urobilinogen, UA: 2 mg/dL — ABNORMAL HIGH (ref 0.0–1.0)

## 2012-07-18 LAB — COMPREHENSIVE METABOLIC PANEL
AST: 147 U/L — ABNORMAL HIGH (ref 0–37)
Albumin: 3.7 g/dL (ref 3.5–5.2)
CO2: 21 mEq/L (ref 19–32)
Calcium: 6.7 mg/dL — ABNORMAL LOW (ref 8.4–10.5)
Creatinine, Ser: 0.67 mg/dL (ref 0.50–1.10)
GFR calc non Af Amer: 83 mL/min — ABNORMAL LOW (ref >90)
Total Protein: 7 g/dL (ref 6.0–8.3)

## 2012-07-18 LAB — CBC WITH DIFFERENTIAL/PLATELET
Basophils Absolute: 0 10*3/uL (ref 0.0–0.1)
Eosinophils Absolute: 0 10*3/uL (ref 0.0–0.7)
Eosinophils Relative: 0 % (ref 0–5)
MCH: 34 pg (ref 26.0–34.0)
MCV: 95.3 fL (ref 78.0–100.0)
Neutrophils Relative %: 55 % (ref 43–77)
Platelets: 143 10*3/uL — ABNORMAL LOW (ref 150–400)
RDW: 14.6 % (ref 11.5–15.5)
WBC: 5.9 10*3/uL (ref 4.0–10.5)

## 2012-07-18 LAB — CBC
Hemoglobin: 11.7 g/dL — ABNORMAL LOW (ref 12.0–15.0)
Platelets: 142 10*3/uL — ABNORMAL LOW (ref 150–400)
RBC: 3.45 MIL/uL — ABNORMAL LOW (ref 3.87–5.11)
WBC: 6.1 10*3/uL (ref 4.0–10.5)

## 2012-07-18 LAB — CREATININE, SERUM
Creatinine, Ser: 0.71 mg/dL (ref 0.50–1.10)
GFR calc non Af Amer: 82 mL/min — ABNORMAL LOW (ref >90)

## 2012-07-18 LAB — ETHANOL: Alcohol, Ethyl (B): <11 mg/dL (ref 0–11)

## 2012-07-18 LAB — GLUCOSE, CAPILLARY: Glucose-Capillary: 132 mg/dL — ABNORMAL HIGH (ref 70–99)

## 2012-07-18 LAB — URINE MICROSCOPIC-ADD ON

## 2012-07-18 MED ORDER — ADULT MULTIVITAMIN W/MINERALS CH
1.0000 | ORAL_TABLET | Freq: Every day | ORAL | Status: DC
  Filled 2012-07-18: qty 1

## 2012-07-18 MED ORDER — LEVOTHYROXINE SODIUM 112 MCG PO TABS
112.0000 ug | ORAL_TABLET | Freq: Every day | ORAL | Status: DC
  Filled 2012-07-18: qty 1

## 2012-07-18 MED ORDER — ONDANSETRON HCL 4 MG PO TABS: 4.0000 mg | ORAL_TABLET | Freq: Four times a day (QID) | ORAL | Status: DC | PRN

## 2012-07-18 MED ORDER — ONDANSETRON HCL 4 MG/2ML IJ SOLN: 4.0000 mg | Freq: Four times a day (QID) | INTRAMUSCULAR | Status: DC | PRN

## 2012-07-18 MED ORDER — SODIUM CHLORIDE 0.9 % IV SOLN: INTRAVENOUS | Status: DC

## 2012-07-18 MED ORDER — LORAZEPAM 1 MG PO TABS: 1.0000 mg | ORAL_TABLET | Freq: Four times a day (QID) | ORAL | Status: DC | PRN

## 2012-07-18 MED ORDER — CALCIUM CARBONATE ANTACID 500 MG PO CHEW
800.0000 mg | CHEWABLE_TABLET | Freq: Three times a day (TID) | ORAL | Status: DC
  Administered 2012-07-18 – 2012-07-21 (×7): 800 mg via ORAL
  Filled 2012-07-18 (×11): qty 4

## 2012-07-18 MED ORDER — LEVOTHYROXINE SODIUM 112 MCG PO TABS
112.0000 ug | ORAL_TABLET | Freq: Every day | ORAL | Status: DC
  Administered 2012-07-19: 112 ug via ORAL
  Filled 2012-07-18 (×2): qty 1

## 2012-07-18 MED ORDER — DEXTROSE 5 % IV SOLN
1.0000 g | INTRAVENOUS | Status: DC
  Filled 2012-07-18: qty 10

## 2012-07-18 MED ORDER — FOLIC ACID 1 MG PO TABS
1.0000 mg | ORAL_TABLET | Freq: Every day | ORAL | Status: DC
  Administered 2012-07-18 – 2012-07-21 (×4): 1 mg via ORAL
  Filled 2012-07-18 (×4): qty 1

## 2012-07-18 MED ORDER — HYDROCODONE-ACETAMINOPHEN 5-325 MG PO TABS
1.0000 | ORAL_TABLET | ORAL | Status: DC | PRN
Start: 2012-07-18 — End: 2012-07-19
  Administered 2012-07-19: 1 via ORAL
  Filled 2012-07-18: qty 1

## 2012-07-18 MED ORDER — VITAMIN B-1 100 MG PO TABS
100.0000 mg | ORAL_TABLET | Freq: Every day | ORAL | Status: DC
  Administered 2012-07-18 – 2012-07-21 (×4): 100 mg via ORAL
  Filled 2012-07-18 (×4): qty 1

## 2012-07-18 MED ORDER — POTASSIUM CHLORIDE 20 MEQ PO PACK
20.0000 meq | PACK | Freq: Two times a day (BID) | ORAL | Status: DC
  Filled 2012-07-18: qty 1

## 2012-07-18 MED ORDER — LORAZEPAM 1 MG PO TABS
0.0000 mg | ORAL_TABLET | Freq: Four times a day (QID) | ORAL | Status: AC
  Administered 2012-07-19: 1 mg via ORAL
  Filled 2012-07-18: qty 2
  Filled 2012-07-18: qty 4

## 2012-07-18 MED ORDER — HEPARIN SODIUM (PORCINE) 5000 UNIT/ML IJ SOLN
5000.0000 [IU] | Freq: Three times a day (TID) | INTRAMUSCULAR | Status: DC
  Administered 2012-07-18 – 2012-07-19 (×2): 5000 [IU] via SUBCUTANEOUS
  Filled 2012-07-18 (×5): qty 1

## 2012-07-18 MED ORDER — LORAZEPAM 2 MG/ML IJ SOLN: 1.0000 mg | Freq: Four times a day (QID) | INTRAMUSCULAR | Status: DC | PRN

## 2012-07-18 MED ORDER — POLYETHYLENE GLYCOL 3350 17 G PO PACK
17.0000 g | PACK | Freq: Every day | ORAL | Status: DC | PRN
  Filled 2012-07-18: qty 1

## 2012-07-18 MED ORDER — DEXTROSE 5 % IV SOLN
1.0000 g | Freq: Once | INTRAVENOUS | Status: AC
  Administered 2012-07-18: 1 g via INTRAVENOUS
  Filled 2012-07-18: qty 10

## 2012-07-18 MED ORDER — THIAMINE HCL 100 MG/ML IJ SOLN
100.0000 mg | Freq: Every day | INTRAMUSCULAR | Status: DC
  Filled 2012-07-18 (×4): qty 1

## 2012-07-18 MED ORDER — DEXTROSE 5 % IV SOLN
1.0000 g | INTRAVENOUS | Status: DC
  Administered 2012-07-19 – 2012-07-20 (×2): 1 g via INTRAVENOUS
  Filled 2012-07-18 (×3): qty 10

## 2012-07-18 MED ORDER — SODIUM CHLORIDE 0.9 % IV SOLN
INTRAVENOUS | Status: DC
  Administered 2012-07-18 (×2): via INTRAVENOUS

## 2012-07-18 MED ORDER — POTASSIUM CHLORIDE CRYS ER 20 MEQ PO TBCR
40.0000 meq | EXTENDED_RELEASE_TABLET | Freq: Three times a day (TID) | ORAL | Status: DC
  Administered 2012-07-18: 40 meq via ORAL
  Filled 2012-07-18 (×3): qty 2

## 2012-07-18 MED ORDER — LORAZEPAM 1 MG PO TABS
0.0000 mg | ORAL_TABLET | Freq: Two times a day (BID) | ORAL | Status: DC
Start: 2012-07-20 — End: 2012-07-21

## 2012-07-18 MED ORDER — POTASSIUM CHLORIDE 10 MEQ/100ML IV SOLN
10.0000 meq | Freq: Once | INTRAVENOUS | Status: AC
  Administered 2012-07-18: 10 meq via INTRAVENOUS
  Filled 2012-07-18: qty 100

## 2012-07-18 MED ORDER — ADULT MULTIVITAMIN W/MINERALS CH
1.0000 | ORAL_TABLET | Freq: Every day | ORAL | Status: DC
  Administered 2012-07-18 – 2012-07-21 (×4): 1 via ORAL
  Filled 2012-07-18 (×4): qty 1

## 2012-07-18 MED ORDER — SODIUM CHLORIDE 0.9 % IV SOLN
INTRAVENOUS | Status: DC
  Administered 2012-07-18: 15:00:00 via INTRAVENOUS

## 2012-07-18 MED ORDER — MAGNESIUM OXIDE 400 (241.3 MG) MG PO TABS
400.0000 mg | ORAL_TABLET | Freq: Two times a day (BID) | ORAL | Status: DC
  Administered 2012-07-18: 400 mg via ORAL
  Filled 2012-07-18 (×2): qty 1

## 2012-07-18 NOTE — ED Notes (Signed)
Attempted to call report, RN unavailable at this time.

## 2012-07-18 NOTE — ED Notes (Signed)
Pt asked for a urine sample and reports she can give one now. Pt placed on bedpan.

## 2012-07-18 NOTE — ED Provider Notes (Signed)
History     CSN: [id number]  Arrival date & time 07/18/12  1252   First MD Initiated Contact with Patient 07/18/12 1311      Chief Complaint  Patient presents with  . Weakness    (Consider location/radiation/quality/duration/timing/severity/associated sxs/prior treatment) Patient is a 79 y.o. female presenting with weakness. The history is provided by the patient.  Weakness The primary symptoms include vomiting. Primary symptoms do not include headaches, syncope, fever or nausea. The symptoms began yesterday. The symptoms are unchanged.  Additional symptoms include weakness.    Past Medical History  Diagnosis Date  . Abdominal pain   . Hypothyroid   . Diabetes mellitus   . COPD (chronic obstructive pulmonary disease)   . GERD (gastroesophageal reflux disease)   . Blood transfusion   . Hypertension   . Urinary tract infection     Past Surgical History  Procedure Date  . Abdominal hysterectomy   . Esophagogastroduodenoscopy 05/17/2011    Procedure: ESOPHAGOGASTRODUODENOSCOPY (EGD);  Surgeon: Hart Carwin, MD;  Location: [facility] ENDOSCOPY;  Service: Endoscopy;  Laterality: N/A;    Family History  Problem Relation Age of Onset  . Stroke Mother     History  Substance Use Topics  . Smoking status: Smoker, Current Status Unknown -- 0.5 packs/day for 30 years    Types: Cigarettes  . Smokeless tobacco: Not on file  . Alcohol Use: Yes     Comment: Uknown How much, daughter thinks she drinks heavy    OB History    Grav Para Term Preterm Abortions TAB SAB Ect Mult Living                  Review of Systems  Constitutional: Negative for fever.  HENT: Negative.   Respiratory: Positive for shortness of breath. Negative for chest tightness.   Cardiovascular: Negative for chest pain and syncope.  Gastrointestinal: Positive for vomiting. Negative for nausea and diarrhea.  Neurological: Positive for weakness. Negative for headaches.    Allergies  Review of patient's  allergies indicates no known allergies.  Home Medications   Current Outpatient Rx  Name  Route  Sig  Dispense  Refill  . LEVOTHYROXINE SODIUM 112 MCG PO TABS   Oral   Take 112 mcg by mouth daily.         Marland Kitchen POTASSIUM CHLORIDE 20 MEQ PO PACK   Oral   Take 20 mEq by mouth 2 (two) times daily.         Marland Kitchen FOLIC ACID 1 MG PO TABS   Oral   Take 1 mg by mouth daily.         Marland Kitchen LORAZEPAM 0.5 MG PO TABS   Oral   Take 0.5 mg by mouth every 12 (twelve) hours as needed. Anxiety         . ADULT MULTIVITAMIN W/MINERALS CH   Oral   Take 1 tablet by mouth daily.         Marland Kitchen VITAMIN B-1 100 MG PO TABS   Oral   Take 100 mg by mouth daily.           BP 121/83  Pulse 82  Temp 98.8 F (37.1 C) (Oral)  Resp 22  SpO2 99%  LMP 05/17/1983  Physical Exam  Nursing note and vitals reviewed. Constitutional: She is oriented to person, place, and time. She appears well-developed and well-nourished.  HENT:  Head: Normocephalic.  Mouth/Throat: Oropharynx is clear and moist.  Neck: Normal range of motion. Neck supple.  Cardiovascular: Normal rate, regular rhythm, normal heart sounds and intact distal pulses.   Pulmonary/Chest: Effort normal and breath sounds normal.  Abdominal: Soft. Bowel sounds are normal. She exhibits no distension. There is no tenderness. There is no rebound.  Lymphadenopathy:    She has no cervical adenopathy.  Neurological: She is alert and oriented to person, place, and time.  Skin: Skin is warm and dry.    ED Course  Procedures (including critical care time)  Labs Reviewed - No data to display No results found.   1. Weakness generalized       MDM          Linna Hoff, MD 07/18/12 1410

## 2012-07-18 NOTE — ED Notes (Signed)
Pt in radiology 

## 2012-07-18 NOTE — ED Notes (Signed)
C/o weakness yesterday.  Started vomiting yesterday. No vomiting during the night, no vomiting today.  Patient keeps reporting vomiting during an argument yesterday.  Denies chest pain.  Reports no bm for 2 days-patient reports to weak to go to bathroom.  Minimal cough.  Patient appears sob, patient admits she is sob minimally.  Patient belching.  Patient thinks she has a uti now--reports odor to urine.

## 2012-07-18 NOTE — ED Notes (Signed)
Pt unable to void 

## 2012-07-18 NOTE — ED Notes (Signed)
Notified [facility] 

## 2012-07-18 NOTE — ED Notes (Addendum)
Pt c/o generalized weakness that started yesterday. Pt reports yesterday she got in an argument with her daughter and it made her became nauseous and then she vomited and started to feel week. Pt denies n/v at this time. Pt reports her daughter thinks she has a UTI. Pt denies burning with urination and urinary frequency, but reports foul odor from urine. Pt reports she has been able to get around her house with the walker. Pt in nad.

## 2012-07-18 NOTE — ED Provider Notes (Signed)
History     CSN: [id number]  Arrival date & time 07/18/12  1455   First MD Initiated Contact with Patient 07/18/12 1457      Chief Complaint  Patient presents with  . Weakness    (Consider location/radiation/quality/duration/timing/severity/associated sxs/prior treatment) Patient is a 79 y.o. female presenting with weakness. The history is provided by the patient.  Weakness The primary symptoms include vomiting. Primary symptoms do not include headaches or nausea.  Additional symptoms include weakness. Additional symptoms do not include neck stiffness.   patient's had generalized weakness for the last few days. She states she vomited once. Patient is concerned she may have urinary tract infection. No chest pain or cough. She states she's had some dysuria. Abdominal pain. No fevers. No cough. She states that she has been less able to get around. She states she normally gets around really well, however she also states that she has a walker and a wheelchair at home.   Past Medical History  Diagnosis Date  . Abdominal pain   . Hypothyroid   . Diabetes mellitus   . COPD (chronic obstructive pulmonary disease)   . GERD (gastroesophageal reflux disease)   . Blood transfusion   . Hypertension   . Urinary tract infection     Past Surgical History  Procedure Date  . Abdominal hysterectomy   . Esophagogastroduodenoscopy 05/17/2011    Procedure: ESOPHAGOGASTRODUODENOSCOPY (EGD);  Surgeon: Hart Carwin, MD;  Location: [facility] ENDOSCOPY;  Service: Endoscopy;  Laterality: N/A;    Family History  Problem Relation Age of Onset  . Stroke Mother     History  Substance Use Topics  . Smoking status: Smoker, Current Status Unknown -- 0.5 packs/day for 30 years    Types: Cigarettes  . Smokeless tobacco: Not on file  . Alcohol Use: Yes     Comment: Uknown How much, daughter thinks she drinks heavy    OB History    Grav Para Term Preterm Abortions TAB SAB Ect Mult Living                   Review of Systems  Constitutional: Positive for fatigue. Negative for activity change and appetite change.  HENT: Negative for neck stiffness.   Eyes: Negative for pain.  Respiratory: Negative for chest tightness and shortness of breath.   Cardiovascular: Negative for chest pain and leg swelling.  Gastrointestinal: Positive for vomiting. Negative for nausea, abdominal pain and diarrhea.  Genitourinary: Positive for dysuria. Negative for flank pain.  Musculoskeletal: Negative for back pain.  Skin: Negative for rash.  Neurological: Positive for weakness. Negative for numbness and headaches.  Psychiatric/Behavioral: Negative for behavioral problems.    Allergies  Review of patient's allergies indicates no known allergies.  Home Medications   No current outpatient prescriptions on file.  BP 138/79  Pulse 84  Temp 98.4 F (36.9 C) (Oral)  Resp 16  SpO2 98%  LMP 05/17/1983  Physical Exam  Nursing note and vitals reviewed. Constitutional: She is oriented to person, place, and time. She appears well-developed and well-nourished.  HENT:  Head: Normocephalic and atraumatic.  Eyes: EOM are normal. Pupils are equal, round, and reactive to light.  Neck: Normal range of motion. Neck supple.  Cardiovascular: Normal rate, regular rhythm and normal heart sounds.   No murmur heard. Pulmonary/Chest: Effort normal and breath sounds normal. No respiratory distress. She has no wheezes. She has no rales.  Abdominal: Soft. Bowel sounds are normal. She exhibits no distension. There is no tenderness.  There is no rebound and no guarding.  Musculoskeletal: Normal range of motion.  Neurological: She is alert and oriented to person, place, and time. No cranial nerve deficit.  Skin: Skin is warm and dry.       Peeling skin on fingers.  Psychiatric: She has a normal mood and affect. Her speech is normal.    ED Course  Procedures (including critical care time)  Labs Reviewed  CBC WITH  DIFFERENTIAL - Abnormal; Notable for the following:    RBC 3.65 (*)     HCT 34.8 (*)     Platelets 143 (*)     All other components within normal limits  URINALYSIS, ROUTINE W REFLEX MICROSCOPIC - Abnormal; Notable for the following:    Color, Urine AMBER (*)  BIOCHEMICALS MAY BE AFFECTED BY COLOR   APPearance CLOUDY (*)     Bilirubin Urine LARGE (*)     Ketones, ur 40 (*)     Protein, ur 30 (*)     Urobilinogen, UA 2.0 (*)     Leukocytes, UA SMALL (*)     All other components within normal limits  COMPREHENSIVE METABOLIC PANEL - Abnormal; Notable for the following:    Potassium 2.7 (*)     Chloride 90 (*)     Glucose, Bld 116 (*)     Calcium 6.7 (*)     AST 147 (*)     ALT 38 (*)     Total Bilirubin 1.7 (*)     GFR calc non Af Amer 83 (*)     All other components within normal limits  URINE MICROSCOPIC-ADD ON - Abnormal; Notable for the following:    Bacteria, UA MANY (*)     All other components within normal limits  MAGNESIUM - Abnormal; Notable for the following:    Magnesium 0.8 (*)     All other components within normal limits  CBC - Abnormal; Notable for the following:    RBC 3.45 (*)     Hemoglobin 11.7 (*)     HCT 32.7 (*)     Platelets 142 (*)     All other components within normal limits  CREATININE, SERUM - Abnormal; Notable for the following:    GFR calc non Af Amer 82 (*)     All other components within normal limits  TROPONIN I  ETHANOL  URINE CULTURE  COMPREHENSIVE METABOLIC PANEL  CBC  PROTIME-INR  TSH  MAGNESIUM  CULTURE, BLOOD (ROUTINE X 2)  CULTURE, BLOOD (ROUTINE X 2)   Dg Chest 2 View  07/18/2012  *RADIOLOGY REPORT*  Clinical Data: Weakness, cough.  CHEST - 2 VIEW  Comparison: 02/11/2012  Findings: Cardiomegaly.  Lungs are clear.  No effusions.  No acute bony abnormality.  IMPRESSION: Cardiomegaly.  No acute findings.   Original Report Authenticated By: Charlett Nose, M.D.      1. Weakness   2. Hypokalemia   3. Hypocalcemia   4. UTI (lower  urinary tract infection)   5. Alcohol abuse with intoxication   6. Hypothyroid      Date: 07/18/2012  Rate: 80  Rhythm: normal sinus rhythm and premature ventricular contractions (PVC)  QRS Axis: normal  Intervals: normal  ST/T Wave abnormalities: nonspecific T wave changes  Conduction Disutrbances:none  Narrative Interpretation: nonspecific T wave changes laterally.  Old EKG Reviewed: changes noted    MDM  Patient with generalized weakness. Sent in from urgent care. Possible UTI on labs. Also found to have a moderate hypokalemia  and hypocalcemia. She also has some likely dehydration. She'll be admitted to medicine for further evaluation treatment        Juliet Rude. Rubin Payor, MD 07/18/12 2259

## 2012-07-18 NOTE — ED Notes (Signed)
Pt returned from radiology.

## 2012-07-18 NOTE — ED Notes (Signed)
0.9% NS running at [facility], per Dr. Artis Flock.

## 2012-07-18 NOTE — ED Notes (Signed)
Per [facility] - pt coming from urgent care. Pt c/o weakness X 1 day along with n/v. Urgent care thinks she has a UTI. BP 121/55 HR 83 regular. 99% on 2L/min [place]. RR 18. Urgent care put pt on oxygen and started an IV 20G in Left forearm. Pt in nad.

## 2012-07-18 NOTE — ED Notes (Signed)
Meal tray ordered for pt  

## 2012-07-18 NOTE — H&P (Addendum)
[facility] History and Physical  [name] YNW:[id number] DOB: [birth year] DOA: 07/18/2012  Referring physician: Dr Rubin Ortiz PCP: Tammy Showers, MD  Specialists: none  Chief Complaint: generalized weakness  HPI: [name] is a 79 y.o. female  Past medical history of alcohol abuse, diabetes, hypokalemia, and alcohol abuse and hypothyroidism that comes in for generalized weakness 2 days prior to admission. She relates she only vomited once or twice before this started. She then started feeling weak especially in her legs to the point where she couldn't walk. She relates her urine started smelling bad about 4 days prior to admission, but she did not pay any attention. She denies any dysuria.  She does relate poor oral intake for the last 24 hours. She denies any diarrhea, no fever, no burning when she urinates, she has a cough that is nonproductive and has not change in frequency, no shortness of breath. Here in the ED a potassium was checked which was 2.5, and she was also hypocalcemic. An EKG was not done in the ED.  Review of Systems: The patient denies anorexia, fever, weight loss,, vision loss, decreased hearing, hoarseness, chest pain, syncope, dyspnea on exertion, peripheral edema, balance deficits, hemoptysis, abdominal pain, melena, hematochezia, severe indigestion/heartburn, hematuria, incontinence, genital sores,  suspicious skin lesions, transient blindness, difficulty walking, depression, unusual weight change, abnormal bleeding, enlarged lymph nodes, angioedema, and breast masses.    Past Medical History  Diagnosis Date  . Abdominal pain   . Hypothyroid   . Diabetes mellitus   . COPD (chronic obstructive pulmonary disease)   . GERD (gastroesophageal reflux disease)   . Blood transfusion   . Hypertension   . Urinary tract infection    Past Surgical History  Procedure Date  . Abdominal hysterectomy   . Esophagogastroduodenoscopy 05/17/2011   Procedure: ESOPHAGOGASTRODUODENOSCOPY (EGD);  Surgeon: Tammy Carwin, MD;  Location: [facility] ENDOSCOPY;  Service: Endoscopy;  Laterality: N/A;   Social History:  reports that she has been smoking Cigarettes.  She has a 15 pack-year smoking history. She does not have any smokeless tobacco history on file. She reports that she drinks alcohol. She reports that she does not use illicit drugs. Lives alone by herself  No Known Allergies  Family History  Problem Relation Age of Onset  . Stroke Mother     Prior to Admission medications   Medication Sig Start Date End Date Taking? Authorizing Provider  levothyroxine (SYNTHROID, LEVOTHROID) 112 MCG tablet Take 112 mcg by mouth daily.   Yes Historical Provider, MD  Multiple Vitamin (MULTIVITAMIN WITH MINERALS) TABS Take 1 tablet by mouth daily.   Yes Historical Provider, MD  potassium chloride (KLOR-CON) 20 MEQ packet Take 20 mEq by mouth 2 (two) times daily.   Yes Historical Provider, MD   Physical Exam: Filed Vitals:   07/18/12 1501 07/18/12 1502 07/18/12 1545 07/18/12 1615  BP: 135/85  134/85 143/66  Pulse:  78 81 78  Resp:   29 21  SpO2:  100% 99% 100%     General:  Awake alert and oriented times  Eyes: Anicteric no pallor  ENT: Dry mucous membrane  Neck: No JVD  Cardiovascular: Regular rate and rhythm with positive S1 and S2  Respiratory: Good air movement clear to auscultation  Abdomen: Bowel sounds nontender nondistended soft negative CVA tenderness  Skin: No rashes or ulcerations  Musculoskeletal: Intact  Psychiatric: Appropriate  Neurologic: Awake alert and oriented x3 coherent for language 3-12 are grossly intact sensation is intact throughout muscle strength  is 5 over 5 in all 4 extremities tendon reflex status are 2+ and symmetrical bilaterally.  Labs on Admission:  Basic Metabolic Panel:  Lab 07/18/12 1610  NA 139  K 2.7*  CL 90*  CO2 21  GLUCOSE 116*  BUN 17  CREATININE 0.67  CALCIUM 6.7*  MG --  PHOS --    Liver Function Tests:  Lab 07/18/12 1550  AST 147*  ALT 38*  ALKPHOS 112  BILITOT 1.7*  PROT 7.0  ALBUMIN 3.7   No results found for this basename: LIPASE:5,AMYLASE:5 in the last 168 hours No results found for this basename: AMMONIA:5 in the last 168 hours CBC:  Lab 07/18/12 1550  WBC 5.9  NEUTROABS 3.2  HGB 12.4  HCT 34.8*  MCV 95.3  PLT 143*   Cardiac Enzymes:  Lab 07/18/12 1529  CKTOTAL --  CKMB --  CKMBINDEX --  TROPONINI <0.30    BNP (last 3 results) No results found for this basename: PROBNP:3 in the last 8760 hours CBG:  Lab 07/18/12 1440  GLUCAP 132*    Radiological Exams on Admission: Dg Chest 2 View  07/18/2012  *RADIOLOGY REPORT*  Clinical Data: Weakness, cough.  CHEST - 2 VIEW  Comparison: 02/11/2012  Findings: Cardiomegaly.  Lungs are clear.  No effusions.  No acute bony abnormality.  IMPRESSION: Cardiomegaly.  No acute findings.   Original Report Authenticated By: [name], M.D.     EKG: Pending at the time of this dictation  Assessment/Plan Principal Problem: UTI (lower urinary tract infection): - A UA showed only 3-6 white blood cells and many bacteria. She does relate her urine started smelling bad about 3 days prior to admission which is probably contributing to her nausea and vomiting which is probably contributing to her decreased oral intake. We'll go ahead and get a urine culture was started on. Treatment with Rocephin. Blood cultures x2.  Hypokalemia: - This probably secondary to decreased by mouth intake. She does have a history of hypokalemia.  Replete aggressively, also replete her magnesium and we'll check a magnesium level. We'll check an EKG which has not been done here in the emergency room. - She has hypothyroidism as her history. We'll check a TSH. If her TSH is overly treated. We'll have to decrease the amount of Synthroid she is taking.  Weakness: - This most likely secondary to her severe hypokalemia. We'll get a PT OT  consult replete her potassium and reevaluate in the morning.  Alcohol abuse with intoxication: - Monitor with CIWA protocol started on thiamine and folate. She showing no signs of withdrawals. Check alcohol level to   Hypocalcemia - replete orally, check in am. - check a TSH. - no hyperreflexia.  Code Status: full Family Communication: none Disposition Plan: home 2-3 days   Time spent: [address] Rosine Beat [facility] Pager [phone]  If 7PM-7AM, please contact night-coverage [email or web address] Password [facility] 07/18/2012, 6:44 PM

## 2012-07-19 ENCOUNTER — Encounter (HOSPITAL_COMMUNITY): Payer: Self-pay | Admitting: General Practice

## 2012-07-19 DIAGNOSIS — N390 Urinary tract infection, site not specified: Secondary | ICD-10-CM

## 2012-07-19 DIAGNOSIS — F1020 Alcohol dependence, uncomplicated: Secondary | ICD-10-CM | POA: Diagnosis present

## 2012-07-19 LAB — BASIC METABOLIC PANEL
Chloride: 100 mEq/L (ref 96–112)
GFR calc Af Amer: >90 mL/min (ref >90)
GFR calc non Af Amer: 88 mL/min — ABNORMAL LOW (ref >90)
Potassium: 3.8 mEq/L (ref 3.5–5.1)
Sodium: 141 mEq/L (ref 135–145)

## 2012-07-19 LAB — TSH: TSH: 9.583 u[IU]/mL — ABNORMAL HIGH (ref 0.350–4.500)

## 2012-07-19 LAB — CBC
HCT: 28.6 % — ABNORMAL LOW (ref 36.0–46.0)
Hemoglobin: 10.1 g/dL — ABNORMAL LOW (ref 12.0–15.0)
MCHC: 35.3 g/dL (ref 30.0–36.0)
MCV: 95 fL (ref 78.0–100.0)
RDW: 14.7 % (ref 11.5–15.5)
WBC: 5.8 10*3/uL (ref 4.0–10.5)

## 2012-07-19 LAB — GLUCOSE, CAPILLARY
Glucose-Capillary: 260 mg/dL — ABNORMAL HIGH (ref 70–99)
Glucose-Capillary: 136 mg/dL — ABNORMAL HIGH (ref 70–99)

## 2012-07-19 LAB — COMPREHENSIVE METABOLIC PANEL
ALT: 29 U/L (ref 0–35)
Albumin: 3 g/dL — ABNORMAL LOW (ref 3.5–5.2)
Alkaline Phosphatase: 87 U/L (ref 39–117)
Calcium: 5.9 mg/dL — CL (ref 8.4–10.5)
Potassium: 2.8 mEq/L — ABNORMAL LOW (ref 3.5–5.1)
Sodium: 142 mEq/L (ref 135–145)
Total Protein: 5.6 g/dL — ABNORMAL LOW (ref 6.0–8.3)

## 2012-07-19 LAB — PROTIME-INR: INR: 1.63 — ABNORMAL HIGH (ref 0.00–1.49)

## 2012-07-19 MED ORDER — INSULIN ASPART 100 UNIT/ML ~~LOC~~ SOLN
0.0000 [IU] | Freq: Three times a day (TID) | SUBCUTANEOUS | Status: DC
  Administered 2012-07-19: 5 [IU] via SUBCUTANEOUS
  Administered 2012-07-20: 2 [IU] via SUBCUTANEOUS
  Administered 2012-07-20 – 2012-07-21 (×3): 1 [IU] via SUBCUTANEOUS

## 2012-07-19 MED ORDER — MAGNESIUM SULFATE 40 MG/ML IJ SOLN
4.0000 g | Freq: Once | INTRAMUSCULAR | Status: AC
  Administered 2012-07-19: 4 g via INTRAVENOUS
  Filled 2012-07-19: qty 100

## 2012-07-19 MED ORDER — POTASSIUM CHLORIDE CRYS ER 20 MEQ PO TBCR
40.0000 meq | EXTENDED_RELEASE_TABLET | ORAL | Status: AC
  Administered 2012-07-19 (×3): 40 meq via ORAL
  Filled 2012-07-19 (×3): qty 2

## 2012-07-19 MED ORDER — LEVOTHYROXINE SODIUM 125 MCG PO TABS
125.0000 ug | ORAL_TABLET | Freq: Every day | ORAL | Status: DC
  Administered 2012-07-20: 125 ug via ORAL
  Filled 2012-07-19 (×2): qty 1

## 2012-07-19 MED ORDER — HYDROCODONE-ACETAMINOPHEN 5-325 MG PO TABS
1.0000 | ORAL_TABLET | Freq: Four times a day (QID) | ORAL | Status: DC | PRN
Start: 2012-07-19 — End: 2012-07-21

## 2012-07-19 NOTE — Progress Notes (Addendum)
[facility] PROGRESS NOTE  [name]:[id number] DOB: 08/31/1933 DOA: 07/18/2012 PCP: Elby Showers, MD  Brief narrative 79 year old female with PMH of alcohol dependence, DM, hypokalemia, hypothyroidism was admitted on 1/27 with history of generalized weakness, inability to walk, foul-smelling urine without dysuria and poor oral intake. In the ED, potassium 2.5 and features suggestive of UTI. Patient was admitted for further evaluation and management.  Assessment/Plan: 1. UTI: Continue IV Rocephin pending culture. 2. Severe hypokalemia, hypomagnesemia and hypocalcemia: Corrected calcium 6.7. Likely secondary to alcohol dependence. Aggressively replete with by mouth potassium, calcium and IV magnesium. Followup BMP and magnesium later tonight and in a.m. 3. Alcohol dependence: No overt features of withdrawal at this time. Continue Ativan alcohol withdrawal protocol. Cessation counseled. 4. Hypothyroidism: TSH is elevated. Increased Synthroid from 112 mcg > 125 mcg daily. Repeat TSH in 4-6 weeks.  5. Generalized weakness/failure to thrive: secondary to UTI, alcohol dependence and electrolyte abnormalities. PT and OT evaluation. 6. Prolonged QTC: QTC 524 ms. Likely secondary to above electrolyte abnormalities. Replete as above and follow EKG later tonight. Place on telemetry. 7. DM 2: Check CBGs and SSI. 8. Anemia and thrombocytopenia: Possibly from alcohol abuse. No active bleeding. Follow up CBC in a.m. INR 1.63 suggesting? Cirrhosis. DC heparin and place on SCDs. 9. Possible alcoholic cirrhosis with coagulopathy.  Code Status: Full Family Communication: Discussed with patient. Disposition Plan: To be determined.   Consultants:  None  Procedures:  None  Antibiotics:  IV Rocephin 1/27   HPI/Subjective: Patient says that she feels stronger and able to sit up in bed by herself. Asking if she can go home. Volunteered to drinking an ounce of scotch daily but drinks  more with her friend.  Objective: Filed Vitals:   07/18/12 2051 07/19/12 0500 07/19/12 0534 07/19/12 1341  BP: 138/79  128/88 118/67  Pulse: 84  68 79  Temp: 98.4 F (36.9 C)  97.8 F (36.6 C) 97.1 F (36.2 C)  TempSrc: Oral  Oral Axillary  Resp: 16  20 18   Height:  4\' 11"  (1.499 m)    Weight: 44.226 kg (97 lb 8 oz) 46.085 kg (101 lb 9.6 oz)    SpO2: 98%  100% 100%    Intake/Output Summary (Last 24 hours) at 07/19/12 1539 Last data filed at 07/19/12 1343  Gross per 24 hour  Intake 1198.32 ml  Output      0 ml  Net 1198.32 ml   Filed Weights   07/18/12 2051 07/19/12 0500  Weight: 44.226 kg (97 lb 8 oz) 46.085 kg (101 lb 9.6 oz)    Exam:   General exam: moderately built and nourished female, sitting up comfortably in bed.  Respiratory system: Clear to auscultation. No increased work of breathing.  Cardiovascular system: S1 and S2 heard, RRR. No JVD, murmurs or pedal edema.  Gastrointestinal system: Abdomen is nondistended, soft and nontender. Normal bowel sounds heard.  Central nervous system: Alert and oriented. No focal neurological deficits.  Extremities: Symmetric 5 x 5 power. Peripheral pulses symmetrically felt.  Data Reviewed: Basic Metabolic Panel:  Lab 07/19/12 2952 07/18/12 2122 07/18/12 1830 07/18/12 1550  NA 142 -- -- 139  K 2.8* -- -- 2.7*  CL 101 -- -- 90*  CO2 26 -- -- 21  GLUCOSE 93 -- -- 116*  BUN 13 -- -- 17  CREATININE 0.61 0.71 -- 0.67  CALCIUM 5.9* -- -- 6.7*  MG 0.7* -- 0.8* --  PHOS -- -- -- --   Liver Function Tests:  Lab 07/19/12 0630 07/18/12 1550  AST 115* 147*  ALT 29 38*  ALKPHOS 87 112  BILITOT 1.0 1.7*  PROT 5.6* 7.0  ALBUMIN 3.0* 3.7   No results found for this basename: LIPASE:5,AMYLASE:5 in the last 168 hours No results found for this basename: AMMONIA:5 in the last 168 hours CBC:  Lab 07/19/12 0630 07/18/12 2122 07/18/12 1550  WBC 5.8 6.1 5.9  NEUTROABS -- -- 3.2  HGB 10.1* 11.7* 12.4  HCT 28.6* 32.7* 34.8*   MCV 95.0 94.8 95.3  PLT 120* 142* 143*   Cardiac Enzymes:  Lab 07/18/12 1529  CKTOTAL --  CKMB --  CKMBINDEX --  TROPONINI <0.30   BNP (last 3 results) No results found for this basename: PROBNP:3 in the last 8760 hours CBG:  Lab 07/18/12 1440  GLUCAP 132*    No results found for this or any previous visit (from the past 240 hour(s)).   Studies: Dg Chest 2 View  07/18/2012  *RADIOLOGY REPORT*  Clinical Data: Weakness, cough.  CHEST - 2 VIEW  Comparison: 02/11/2012  Findings: Cardiomegaly.  Lungs are clear.  No effusions.  No acute bony abnormality.  IMPRESSION: Cardiomegaly.  No acute findings.   Original Report Authenticated By: Charlett Nose, M.D.     Scheduled Meds:    . calcium carbonate  800 mg of elemental calcium Oral TID  . cefTRIAXone (ROCEPHIN)  IV  1 g Intravenous Q24H  . folic acid  1 mg Oral Daily  . levothyroxine  125 mcg Oral QAC breakfast  . LORazepam  0-4 mg Oral Q6H   Followed by  . LORazepam  0-4 mg Oral Q12H  . multivitamin with minerals  1 tablet Oral Daily  . potassium chloride  40 mEq Oral Q4H  . thiamine  100 mg Oral Daily   Or  . thiamine  100 mg Intravenous Daily   Continuous Infusions:   Principal Problem:  *UTI (lower urinary tract infection) Active Problems:  Hypokalemia  Weakness  Alcohol abuse with intoxication  Hypocalcemia    Time spent: 45 minutes    [facility]  [facility] Pager [phone]. If 8PM-8AM, please contact night-coverage at [email or web address], password [facility] 07/19/2012, 3:39 PM  LOS: 1 day

## 2012-07-19 NOTE — Evaluation (Signed)
Occupational Therapy Evaluation Patient Details Name: [name] MRN: [id number] DOB: 19-Aug-1933 Today's Date: 07/19/2012 Time: 4098-1191 OT Time Calculation (min): 24 min  OT Assessment / Plan / Recommendation Clinical Impression  This 79 yo female admitted with generalized weakness presents to acute OT with decreased safety awareness and other problems below. WIll benefit from acut OT with follow up at [facility]    OT Assessment  Patient needs continued OT Services    Follow Up Recommendations  SNF ((if patient refuses SNF then [facility] with 24 S/A))    Barriers to Discharge Decreased caregiver support    Equipment Recommendations  None recommended by OT       Frequency  Min 2X/week    Precautions / Restrictions Precautions Precautions: Fall Restrictions Weight Bearing Restrictions: No   Pertinent Vitals/Pain Both feet, repositioned    ADL  Eating/Feeding: Simulated;Independent Where Assessed - Eating/Feeding: Chair Grooming: Performed;Min guard;Wash/dry hands Where Assessed - Grooming: Unsupported standing Upper Body Bathing: Simulated;Set up Where Assessed - Upper Body Bathing: Unsupported sitting Lower Body Bathing: Min guard Where Assessed - Lower Body Bathing: Supported sit to stand Upper Body Dressing: Simulated;Set up;Supervision/safety Where Assessed - Upper Body Dressing: Unsupported sitting Lower Body Dressing: Performed;Min guard Where Assessed - Lower Body Dressing: Supported sit to stand Toilet Transfer: Performed;Minimal assistance Toilet Transfer Method: Sit to Barista: Comfort height toilet;Grab bars Toileting - Architect and Hygiene: Simulated;Min guard Where Assessed - Engineer, mining and Hygiene: Standing Equipment Used: Rolling walker;Gait belt Transfers/Ambulation Related to ADLs: Min A  for all    OT Diagnosis: Generalized weakness;Acute pain  OT Problem List: Impaired balance (sitting  and/or standing);Pain;Decreased knowledge of use of DME or AE OT Treatment Interventions: Self-care/ADL training;DME and/or AE instruction;Patient/family education;Balance training   OT Goals Acute Rehab OT Goals OT Goal Formulation: With patient Time For Goal Achievement: 08/02/12 Potential to Achieve Goals: Good ADL Goals Pt Will Perform Grooming: with set-up;with supervision;Unsupported;Standing at sink ADL Goal: Grooming - Progress: Goal set today Pt Will Perform Upper Body Bathing: with supervision;with set-up;Standing at sink;Sitting at sink;Unsupported ADL Goal: Upper Body Bathing - Progress: Goal set today Pt Will Perform Lower Body Bathing: with set-up;with supervision;Unsupported;Sit to stand from chair;Sit to stand from bed ADL Goal: Lower Body Bathing - Progress: Goal set today Pt Will Perform Upper Body Dressing: Independently;with supervision;with set-up;Unsupported;Sit to stand from chair;Sit to stand from bed ADL Goal: Upper Body Dressing - Progress: Goal set today Pt Will Perform Lower Body Dressing: with set-up;with supervision;Sit to stand from chair;Sit to stand from bed;Unsupported ADL Goal: Lower Body Dressing - Progress: Goal set today Pt Will Transfer to Toilet: with supervision;Ambulation;with DME;Comfort height toilet;Grab bars ADL Goal: Toilet Transfer - Progress: Goal set today Pt Will Perform Toileting - Clothing Manipulation: with supervision;Standing ADL Goal: Toileting - Clothing Manipulation - Progress: Goal set today Pt Will Perform Toileting - Hygiene: with supervision;Sit to stand from 3-in-1/toilet ADL Goal: Toileting - Hygiene - Progress: Goal set today  Visit Information  Last OT Received On: 07/19/12 Assistance Needed: +1    Subjective Data  Subjective: My feet hurt, I need to soak them   Prior Functioning     Home Living Lives With: Alone Available Help at Discharge: Family;Friend(s);Available PRN/intermittently Type of Home:  House Home Access: Stairs to enter Entrance Stairs-Number of Steps: 1 Entrance Stairs-Rails: None Home Layout: One level Bathroom Shower/Tub:  (takes tub baths) Bathroom Toilet: Handicapped height Home Adaptive Equipment: Grab bars around toilet;Grab bars in shower;Walker -  rolling;Bedside commode/3-in-1 Prior Function Level of Independence: Independent with assistive device(s) Able to Take Stairs?: Yes Driving: Yes Vocation: Retired Musician: No difficulties Dominant Hand: Right            Cognition  Overall Cognitive Status: Impaired Area of Impairment: Safety/judgement;Problem solving Arousal/Alertness: Awake/alert Orientation Level: Appears intact for tasks assessed;Oriented X4 / Intact Behavior During Session: [facility] for tasks performed Safety/Judgement: Decreased safety judgement for tasks assessed;Impulsive Safety/Judgement - Other Comments: pt impulsive and trying get up without assist, getting wrapped up in IV lines not recognizing need for assist or instruction Problem Solving: difficulty initiating positions to get out of bed    Extremity/Trunk Assessment Right Upper Extremity Assessment RUE ROM/Strength/Tone: Within functional levels Left Upper Extremity Assessment LUE ROM/Strength/Tone: Within functional levels Right Lower Extremity Assessment RLE ROM/Strength/Tone: [facility] for tasks assessed Left Lower Extremity Assessment LLE ROM/Strength/Tone: [facility] for tasks assessed Trunk Assessment Trunk Assessment: Kyphotic     Mobility Bed Mobility Bed Mobility: Supine to Sit;Sitting - Scoot to Edge of Bed Supine to Sit: 5: Supervision;HOB elevated;With rails (15 degrees) Sitting - Scoot to Edge of Bed: 5: Supervision;With rail Details for Bed Mobility Assistance: VC's for ability to do it without assist0 Transfers Sit to Stand: 4: Min assist Stand to Sit: 4: Min assist Details for Transfer Assistance: VC's for hand placement and safety               End of Session OT - End of Session Equipment Utilized During Treatment: Gait belt Activity Tolerance: Patient tolerated treatment well Patient left: in chair;with call bell/phone within reach Nurse Communication: Mobility status (+1 min A)       Evette Georges [phone] 07/19/2012, 11:25 AM

## 2012-07-19 NOTE — Progress Notes (Signed)
Asked pt if she needed to void since she had not used the bathroom since she arrived to the floor about 2300 07/18/12. Pt stated she had the urge to void but did not want to get up to use the bathroom currently. I let her know we would use the bed pan and pt still refused stating it was too cold and she would let us know later when she had to void.

## 2012-07-19 NOTE — Progress Notes (Addendum)
CRITICAL VALUE ALERT  Critical value received:  CA 5.9 MG 0.7 ELONGATED QT ON EKG.  Date of notification:  07/19/12  Time of notification:  0800  Critical value read back:yes  Nurse who received alert:  Marjorie Smolder, RN  MD notified (1st page):  DR. HONGALGI  Time of first page:  0804  MD notified (2nd page):  Time of second page:  Responding MD:  DR. HONGALGI  Time MD responded:  [phone]

## 2012-07-19 NOTE — Evaluation (Signed)
Physical Therapy Evaluation Patient Details Name: [name] MRN: [id number] DOB: 1933-10-13 Today's Date: 07/19/2012 Time: 1040-1106 PT Time Calculation (min): 26 min  PT Assessment / Plan / Recommendation Clinical Impression  Pt is a 79 y.o. female who presents with decreased K and possible UTI. Patient demonstrates deficits in functional mobility secondary to weakness, instability, pain and decreased safety awareness. Patient demonstrates increased need for assistance while OOB at this time. Rec continued skilled PT to address deficits and maximize function.    PT Assessment  Patient needs continued PT services    Follow Up Recommendations  SNF;Supervision/Assistance - 24 hour (If family can provide 24/7 asupervision/assist then HHPT.)    Does the patient have the potential to tolerate intense rehabilitation      Barriers to Discharge Decreased caregiver support patient lives alone    Equipment Recommendations  None recommended by PT    Recommendations for Other Services     Frequency Min 3X/week    Precautions / Restrictions Precautions Precautions: Fall Restrictions Weight Bearing Restrictions: No   Pertinent Vitals/Pain "sore feet"      Mobility  Bed Mobility Bed Mobility: Supine to Sit;Sitting - Scoot to Edge of Bed Supine to Sit: 5: Supervision;HOB elevated;With rails (15 degrees) Sitting - Scoot to Edge of Bed: 5: Supervision;With rail Details for Bed Mobility Assistance: VC's for ability to do it without assist0 Transfers Transfers: Sit to Stand;Stand to Sit Sit to Stand: 4: Min assist Stand to Sit: 4: Min assist Details for Transfer Assistance: VC's for hand placement and safety Ambulation/Gait Ambulation/Gait Assistance: 4: Min assist Ambulation Distance (Feet): 20 Feet Assistive device: 1 person hand held assist Ambulation/Gait Assistance Details: VC's for safety Gait Pattern: Step-to pattern;Decreased stride length;Narrow base of  support Gait velocity: decreased General Gait Details: some unsteadiness; pt reaches for furniture to hold  Stairs: No    Shoulder Instructions     Exercises  Ankle Pumps; 20 reps Bilaterally   PT Diagnosis: Difficulty walking;Abnormality of gait;Generalized weakness;Acute pain  PT Problem List: Decreased strength;Decreased range of motion;Decreased activity tolerance;Decreased safety awareness;Decreased balance;Decreased mobility;Pain PT Treatment Interventions: DME instruction;Gait training;Therapeutic activities;Stair training;Functional mobility training;Therapeutic exercise;Patient/family education   PT Goals Acute Rehab PT Goals PT Goal Formulation: With patient Time For Goal Achievement: 07/26/12 Potential to Achieve Goals: Good Pt will go Sit to Stand: with modified independence PT Goal: Sit to Stand - Progress: Goal set today Pt will go Stand to Sit: with modified independence PT Goal: Stand to Sit - Progress: Goal set today Pt will Ambulate: >150 feet;with modified independence;with rolling walker PT Goal: Ambulate - Progress: Goal set today Pt will Go Up / Down Stairs: 1-2 stairs;with modified independence PT Goal: Up/Down Stairs - Progress: Goal set today  Visit Information  Last PT Received On: 07/19/12 Assistance Needed: +1    Subjective Data  Subjective: I have sore feet they need to soak   Prior Functioning  Home Living Lives With: Alone Available Help at Discharge: Family;Friend(s);Available PRN/intermittently Type of Home: House Home Access: Stairs to enter Entrance Stairs-Number of Steps: 1 Entrance Stairs-Rails: None Home Layout: One level Bathroom Shower/Tub:  (takes tub baths) Bathroom Toilet: Handicapped height Home Adaptive Equipment: Grab bars around toilet;Grab bars in shower;Walker - rolling;Bedside commode/3-in-1 Prior Function Level of Independence: Independent with assistive device(s) Able to Take Stairs?: Yes Driving: Yes Vocation:  Retired Musician: No difficulties Dominant Hand: Right    Cognition  Overall Cognitive Status: Impaired Area of Impairment: Safety/judgement;Problem solving Arousal/Alertness: Awake/alert Orientation Level: Appears  intact for tasks assessed;Oriented X4 / Intact Behavior During Session: [facility] for tasks performed Safety/Judgement: Decreased safety judgement for tasks assessed;Impulsive Safety/Judgement - Other Comments: pt impulsive and trying get up without assist, getting wrapped up in IV lines not recognizing need for assist or instruction Problem Solving: difficulty initiating positions to get out of bed    Extremity/Trunk Assessment Right Upper Extremity Assessment RUE ROM/Strength/Tone: Within functional levels Left Upper Extremity Assessment LUE ROM/Strength/Tone: Within functional levels Right Lower Extremity Assessment RLE ROM/Strength/Tone: [facility] for tasks assessed Left Lower Extremity Assessment LLE ROM/Strength/Tone: [facility] for tasks assessed Trunk Assessment Trunk Assessment: Kyphotic   Balance Balance Balance Assessed: Yes Static Standing Balance Static Standing - Balance Support: Bilateral upper extremity supported Static Standing - Level of Assistance: 4: Min assist Static Standing - Comment/# of Minutes: 2 minutes  End of Session PT - End of Session Equipment Utilized During Treatment: Gait belt Activity Tolerance: Patient limited by pain;Patient limited by fatigue (sore feet) Patient left: in chair;with call bell/phone within reach  GP     Fabio Asa 07/19/2012, 11:32 AM  Charlotte Crumb, PT DPT  [phone]

## 2012-07-20 DIAGNOSIS — E119 Type 2 diabetes mellitus without complications: Secondary | ICD-10-CM

## 2012-07-20 DIAGNOSIS — N390 Urinary tract infection, site not specified: Principal | ICD-10-CM

## 2012-07-20 DIAGNOSIS — F1020 Alcohol dependence, uncomplicated: Secondary | ICD-10-CM

## 2012-07-20 LAB — CBC
Platelets: 113 10*3/uL — ABNORMAL LOW (ref 150–400)
RBC: 3.17 MIL/uL — ABNORMAL LOW (ref 3.87–5.11)
RDW: 14.8 % (ref 11.5–15.5)
WBC: 6.7 10*3/uL (ref 4.0–10.5)

## 2012-07-20 LAB — GLUCOSE, CAPILLARY: Glucose-Capillary: 195 mg/dL — ABNORMAL HIGH (ref 70–99)

## 2012-07-20 LAB — BASIC METABOLIC PANEL
Calcium: 6.8 mg/dL — ABNORMAL LOW (ref 8.4–10.5)
GFR calc Af Amer: >90 mL/min (ref >90)
GFR calc non Af Amer: 90 mL/min — ABNORMAL LOW (ref >90)
Potassium: 3.8 mEq/L (ref 3.5–5.1)
Sodium: 140 mEq/L (ref 135–145)

## 2012-07-20 LAB — HEMOGLOBIN A1C: Mean Plasma Glucose: 143 mg/dL — ABNORMAL HIGH (ref <117)

## 2012-07-20 LAB — URINE CULTURE

## 2012-07-20 LAB — MAGNESIUM: Magnesium: 1.4 mg/dL — ABNORMAL LOW (ref 1.5–2.5)

## 2012-07-20 MED ORDER — LEVOFLOXACIN 500 MG PO TABS: 500.0000 mg | ORAL_TABLET | Freq: Every day | ORAL | Status: DC

## 2012-07-20 MED ORDER — MAGNESIUM SULFATE 40 MG/ML IJ SOLN
2.0000 g | Freq: Once | INTRAMUSCULAR | Status: AC
  Administered 2012-07-20: 2 g via INTRAVENOUS
  Filled 2012-07-20: qty 50

## 2012-07-20 MED ORDER — CHOLECALCIFEROL 10 MCG (400 UNIT) PO TABS
400.0000 [IU] | ORAL_TABLET | Freq: Two times a day (BID) | ORAL | Status: DC
  Administered 2012-07-20 – 2012-07-21 (×3): 400 [IU] via ORAL
  Filled 2012-07-20 (×4): qty 1

## 2012-07-20 MED ORDER — LEVOFLOXACIN 250 MG PO TABS
250.0000 mg | ORAL_TABLET | Freq: Every day | ORAL | Status: DC
  Administered 2012-07-21: 250 mg via ORAL
  Filled 2012-07-20: qty 1

## 2012-07-20 MED ORDER — LEVOTHYROXINE SODIUM 112 MCG PO TABS
112.0000 ug | ORAL_TABLET | Freq: Every day | ORAL | Status: DC
  Administered 2012-07-21: 112 ug via ORAL
  Filled 2012-07-20 (×2): qty 1

## 2012-07-20 NOTE — Progress Notes (Signed)
[facility] PROGRESS NOTE  [name] LKG:[id number] DOB: 11/03/1933 DOA: 07/18/2012 PCP: Elby Showers, MD  Assessment/Plan: Principal Problem:  *UTI (lower urinary tract infection) Active Problems:  Anemia  Hypokalemia  Thrombocytopenia  DM (diabetes mellitus)  Weakness  Hypocalcemia  Alcohol dependence  Hypomagnesemia    1. UTI:  Patient presented with generalized weakness, inability to walk, foul-smelling urine. Urinalysis revealed a significant bacteriuria. On iv  Continue IV Rocephin, day # 3. Urine cultures are still pending, and blood cultures are negative so far. Plan to complete a 7-day course of empiric antibiotics on 07/24/12.  2. Electrolyte abnormalities:  Patient was found to have severe hypokalemia, hypomagnesemia and hypocalcemia (Corrected calcium 6.7), likely secondary to alcohol dependence. Repleting as indicated. Following BMP and magnesium. 3. Alcohol dependence: No overt features of withdrawal at this time. Continue Ativan alcohol withdrawal protocol. Cessation counseled. 4. Hypothyroidism: TSH is elevated at 9.583. Increased Synthroid from 112 mcg to 125 mcg daily on 07/19/12. Have discussed with patient's daughter, Adela Lank today, and she has confirmed that patient was non-compliant with er Synthroid, pre-admission. Will therefore, continue pre-admission dose of Synthroid, and patient will follow up with her PMD with repeat TSH in 4-6 weeks.  5. Generalized weakness/failure to thrive: Multifactorial, secondary to UTI, alcohol dependence and electrolyte abnormalities. PT and OT evaluation. SNF vs Home with 24-hour supervision recommended. Patient has opted for the latter.  6. Prolonged QTC: QTC 524 ms. Likely secondary to above electrolyte abnormalities. Managing as described in #2. Monitoring telemetrically. No arrhythmias recorded.  7. DM 2: Controlled on diet/SSI. HBA1C is 6.6. 8. Anemia and thrombocytopenia: Possibly from alcohol abuse. No  active bleeding. Following CBC in a.m. INR 1.63 suggesting chronic liver disease. Heparin D/C-ed. Now on SCDs. Possible alcoholic cirrhosis with coagulopathy.   Code Status: Full Code.  Family Communication:  Disposition Plan: To be determined. Aiming possible discharge on 07/21/12.    Brief narrative: 79 year old female with PMH of alcohol dependence, DM, hypokalemia, hypothyroidism was admitted on 07/18/12 with history of generalized weakness, inability to walk, foul-smelling urine without dysuria and poor oral intake. In the ED, potassium 2.5 and features suggestive of UTI. Patient was admitted for further evaluation and management.   Consultants:  N/A.   Procedures:  CXR  Antibiotics:  Rocephin 07/18/12>>>  HPI/Subjective: No new issues   Objective: Vital signs in last 24 hours: Temp:  [97.1 F (36.2 C)-98.8 F (37.1 C)] 98.4 F (36.9 C) (01/29 0618) Pulse Rate:  [78-81] 81  (01/29 0618) Resp:  [18-20] 20  (01/29 0618) BP: (118-136)/(67-89) 136/71 mmHg (01/29 0618) SpO2:  [99 %-100 %] 99 % (01/29 0618) Weight change:  Last BM Date: 07/20/12  Intake/Output from previous day: 01/28 0701 - 01/29 0700 In: 390 [P.O.:240; IV Piggyback:150] Out: 3 [Urine:3]     Physical Exam: General: Comfortable, alert, communicative, fully oriented, not short of breath at rest.  HEENT:  Mild clinical pallor, no jaundice, no conjunctival injection or discharge. Hydration is satisfactory.  NECK:  Supple, JVP not seen, no carotid bruits, no palpable lymphadenopathy, no palpable goiter. CHEST:  Clinically clear to auscultation, no wheezes, no crackles. HEART:  Sounds 1 and 2 heard, normal, regular, no murmurs. ABDOMEN:  Full, softly distended, non-tender, no palpable organomegaly, no palpable masses, normal bowel sounds. GENITALIA:  Not examined. LOWER EXTREMITIES:  No pitting edema, palpable peripheral pulses. MUSCULOSKELETAL SYSTEM:  Generalized osteoarthritic changes, otherwise,  normal. CENTRAL NERVOUS SYSTEM:  No focal neurologic deficit on gross examination.  Lab Results:  Alvira Philips  07/20/12 0420 07/19/12 0630  WBC 6.7 5.8  HGB 10.7* 10.1*  HCT 30.6* 28.6*  PLT 113* 120*    Basename 07/20/12 0420 07/19/12 2015  NA 140 141  K 3.8 3.8  CL 101 100  CO2 25 24  GLUCOSE 109* 136*  BUN 7 9  CREATININE 0.53 0.57  CALCIUM 6.8* 6.9*   Recent Results (from the past 240 hour(s))  URINE CULTURE     Status: Normal (Preliminary result)   Collection Time   07/18/12  5:15 PM      Component Value Range Status Comment   Specimen Description URINE, CLEAN CATCH   Final    Special Requests NONE   Final    Culture  Setup Time 07/18/2012 19:06   Final    Colony Count PENDING   Incomplete    Culture Culture reincubated for better growth   Final    Report Status PENDING   Incomplete      Studies/Results: Dg Chest 2 View  07/18/2012  *RADIOLOGY REPORT*  Clinical Data: Weakness, cough.  CHEST - 2 VIEW  Comparison: 02/11/2012  Findings: Cardiomegaly.  Lungs are clear.  No effusions.  No acute bony abnormality.  IMPRESSION: Cardiomegaly.  No acute findings.   Original Report Authenticated By: Charlett Nose, M.D.     Medications: Scheduled Meds:   . calcium carbonate  800 mg of elemental calcium Oral TID  . cefTRIAXone (ROCEPHIN)  IV  1 g Intravenous Q24H  . folic acid  1 mg Oral Daily  . insulin aspart  0-9 Units Subcutaneous TID WC  . levothyroxine  125 mcg Oral QAC breakfast  . LORazepam  0-4 mg Oral Q6H   Followed by  . LORazepam  0-4 mg Oral Q12H  . multivitamin with minerals  1 tablet Oral Daily  . thiamine  100 mg Oral Daily   Or  . thiamine  100 mg Intravenous Daily   Continuous Infusions:  PRN Meds:.HYDROcodone-acetaminophen, LORazepam, LORazepam, ondansetron (ZOFRAN) IV, ondansetron, polyethylene glycol    LOS: 2 days   Norelle Runnion,CHRISTOPHER  [facility] Pager [phone]. If 8PM-8AM, please contact night-coverage at [email or web address], password  [facility] 07/20/2012, 8:22 AM  LOS: 2 days

## 2012-07-20 NOTE — Progress Notes (Addendum)
Physical Therapy Treatment Patient Details Name: [name] MRN: [id number] DOB: 10-01-1933 Today's Date: 07/20/2012 Time: 0215-0238 PT Time Calculation (min): 23 min  PT Assessment / Plan / Recommendation Comments on Treatment Session   Pt refusing SNF placement as well as HHPT despite strong encouragement to agree to HHPT at d/c.  Pt reports she has 7 people that will be able to provide (A) as needed.      Follow Up Recommendations  SNF;Supervision/Assistance - 24 hour (If familiy can provide 24/7 (S)/(A) then HHPT)     Does the patient have the potential to tolerate intense rehabilitation     Barriers to Discharge        Equipment Recommendations  None recommended by PT    Recommendations for Other Services    Frequency Min 3X/week   Plan Discharge plan remains appropriate    Precautions / Restrictions Precautions Precautions: Fall Restrictions Weight Bearing Restrictions: No       Mobility  Bed Mobility Bed Mobility: Supine to Sit;Sitting - Scoot to Edge of Bed;Sit to Supine Supine to Sit: 6: Modified independent (Device/Increase time);HOB flat Sitting - Scoot to Edge of Bed: 6: Modified independent (Device/Increase time) Transfers Transfers: Sit to Stand;Stand to Sit Sit to Stand: 4: Min assist;With upper extremity assist;From bed Stand to Sit: 4: Min assist;With upper extremity assist;To bed Details for Transfer Assistance: Cues for hand placement & technique.   Ambulation/Gait Ambulation/Gait Assistance: 4: Min assist Ambulation Distance (Feet): 150 Feet Assistive device: 1 person hand held assist Ambulation/Gait Assistance Details: (A) for stability & safety.   Pt with short shuffle like steps.  Pt reports she normally uses RW for assistance.   Gait Pattern: Step-through pattern;Decreased stride length;Narrow base of support;Decreased step length - right;Decreased step length - left Stairs: No      PT Goals Acute Rehab PT Goals Time For Goal  Achievement: 07/26/12 Potential to Achieve Goals: Good Pt will go Sit to Stand: with modified independence PT Goal: Sit to Stand - Progress: Not met Pt will go Stand to Sit: with modified independence PT Goal: Stand to Sit - Progress: Not met Pt will Ambulate: >150 feet;with modified independence;with rolling walker PT Goal: Ambulate - Progress: Progressing toward goal Pt will Go Up / Down Stairs: 1-2 stairs;with modified independence  Visit Information  Last PT Received On: 07/20/12 Assistance Needed: +1    Subjective Data  Subjective: "I don't need HHPT"   Cognition  Overall Cognitive Status: Impaired Area of Impairment: Safety/judgement Arousal/Alertness: Awake/alert Orientation Level: Appears intact for tasks assessed;Oriented X4 / Intact Behavior During Session: [facility] for tasks performed Safety/Judgement: Decreased safety judgement for tasks assessed;Impulsive Safety/Judgement - Other Comments: pt impulsive and trying get up without assist, getting wrapped up in IV lines not recognizing need for assist or instruction    Balance     End of Session PT - End of Session Equipment Utilized During Treatment: Gait belt Activity Tolerance: Patient tolerated treatment well;Patient limited by pain;Patient limited by fatigue (sore feet) Patient left: in bed;with call bell/phone within reach     [place], [place] [phone] 07/20/2012

## 2012-07-20 NOTE — Clinical Social Work Psychosocial (Signed)
     Clinical Social Work Department BRIEF PSYCHOSOCIAL ASSESSMENT 07/20/2012  Patient:  [name], [name]     Account Number:  [id number]     Admit date:  07/18/2012  Clinical Social Worker:  Hulan Fray  Date/Time:  07/20/2012 10:36 AM  Referred by:  CSW  Date Referred:  07/20/2012 Referred for  SNF Placement   Other Referral:   Interview type:  Patient Other interview type:    PSYCHOSOCIAL DATA Living Status:  ALONE Admitted from facility:   Level of care:   Primary support name:  Dr. Alfredo Batty Primary support relationship to patient:  CHILD, ADULT Degree of support available:   supportive    CURRENT CONCERNS Current Concerns  None Noted   Other Concerns:    SOCIAL WORK ASSESSMENT / PLAN Clinical Social Worker reviewed chart and noticed PT and OT's recommendation for short term SNF placement. CSW introduced self and explained reason for visit to patient. 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 Patient immediatly refused snf placement and stated that she [facility] Patient reported that she has a fear of hospitals and snf's because "you catch more things in those places."    Patient reported that she has seven people coming in and out of her house assisting her. Patient reported that she has a good support system, where she has a person who drives, a perosn who checks on her everyday, she has her sister, Randa Evens [phone]) and daughter who are supports as well. Patient reported that there are "two guys who I trust to assist me, my neighbor Channing Mutters and his wife and Brett Canales, who lives in [place]." Per patient, she stated that "if I was ready to go today, I can call Channing Mutters and if he isn't working he will come get me and take me to my house." Patient reported that she already has equipment at home. She reported that she has a walker and bedside commode. Patient stated  that she has plenty of food in her house and some that are frozen that she can warm up as well. Patient reported that she was receiving meals on wheels, but stopped that program because the "food was terrible."    CSW inquired about home health services and patient refused that option as well. Patient stated, "with seven people coming in and out of my house, I have enough people who can get me up if I need to and I can do my own exercises." Patient reported that her sister will be in town Friday or Saturday and that she cleans for the patient. Patient stated, "once the doctor releases me, I'm going home." CSW will sign off, but available as needed, as patient is refusing initiation of snf placement.   Assessment/plan status:  No Further Intervention Required Other assessment/ plan:   Information/referral to community resources:   Patient declined SNF placement and home health services    PATIENTS/FAMILYS RESPONSE TO PLAN OF CARE: Patient appeared adament of not pursing snf placement or home health services at discharge. Patient appears determined to return home at discharge.

## 2012-07-21 LAB — CBC
HCT: 31.9 % — ABNORMAL LOW (ref 36.0–46.0)
Hemoglobin: 11.2 g/dL — ABNORMAL LOW (ref 12.0–15.0)
MCV: 97.3 fL (ref 78.0–100.0)
Platelets: 120 10*3/uL — ABNORMAL LOW (ref 150–400)
RBC: 3.28 MIL/uL — ABNORMAL LOW (ref 3.87–5.11)
WBC: 7.9 10*3/uL (ref 4.0–10.5)

## 2012-07-21 LAB — GLUCOSE, CAPILLARY: Glucose-Capillary: 100 mg/dL — ABNORMAL HIGH (ref 70–99)

## 2012-07-21 LAB — COMPREHENSIVE METABOLIC PANEL
Albumin: 2.8 g/dL — ABNORMAL LOW (ref 3.5–5.2)
Alkaline Phosphatase: 122 U/L — ABNORMAL HIGH (ref 39–117)
BUN: 5 mg/dL — ABNORMAL LOW (ref 6–23)
Chloride: 101 mEq/L (ref 96–112)
Creatinine, Ser: 0.55 mg/dL (ref 0.50–1.10)
GFR calc Af Amer: >90 mL/min (ref >90)
GFR calc non Af Amer: 89 mL/min — ABNORMAL LOW (ref >90)
Glucose, Bld: 132 mg/dL — ABNORMAL HIGH (ref 70–99)
Total Bilirubin: 0.7 mg/dL (ref 0.3–1.2)

## 2012-07-21 MED ORDER — CALCIUM CARBONATE ANTACID 500 MG PO CHEW: 1.0000 | CHEWABLE_TABLET | Freq: Three times a day (TID) | ORAL | Status: DC

## 2012-07-21 MED ORDER — LEVOFLOXACIN 250 MG PO TABS: 250.0000 mg | ORAL_TABLET | Freq: Every day | ORAL | Status: DC

## 2012-07-21 MED ORDER — FOLIC ACID 1 MG PO TABS: 1.0000 mg | ORAL_TABLET | Freq: Every day | ORAL | Status: DC

## 2012-07-21 MED ORDER — VITAMIN D 400 UNITS PO TABS: 400.0000 [IU] | ORAL_TABLET | Freq: Two times a day (BID) | ORAL | Status: DC

## 2012-07-21 MED ORDER — MAGNESIUM OXIDE 400 (241.3 MG) MG PO TABS
400.0000 mg | ORAL_TABLET | Freq: Two times a day (BID) | ORAL | Status: DC
  Filled 2012-07-21 (×2): qty 1

## 2012-07-21 MED ORDER — MAGNESIUM OXIDE 400 (241.3 MG) MG PO TABS: 400.0000 mg | ORAL_TABLET | Freq: Two times a day (BID) | ORAL | Status: DC

## 2012-07-21 MED ORDER — THIAMINE HCL 100 MG PO TABS: 100.0000 mg | ORAL_TABLET | Freq: Every day | ORAL | Status: DC

## 2012-07-21 MED ORDER — MAGNESIUM SULFATE 40 MG/ML IJ SOLN
2.0000 g | Freq: Once | INTRAMUSCULAR | Status: AC
  Administered 2012-07-21: 2 g via INTRAVENOUS
  Filled 2012-07-21: qty 50

## 2012-07-21 NOTE — Progress Notes (Signed)
Pt refused lab draw this morning after mag dose was given.

## 2012-07-21 NOTE — Progress Notes (Signed)
Pt discharged to home discharge instruction explained pt verbalized understanding.

## 2012-07-21 NOTE — Care Management Note (Signed)
  Page 1 of 1   07/21/2012     11:39:11 AM   CARE MANAGEMENT NOTE 07/21/2012  Patient:  [name], [name]   Account Number:  [id number]  Date Initiated:  07/21/2012  Documentation initiated by:  Ronny Flurry  Subjective/Objective Assessment:     Action/Plan:   Anticipated DC Date:     Anticipated DC Plan:  HOME/SELF CARE         Choice offered to / List presented to:             Status of service:  Completed, signed off Medicare Important Message given?   (If response is "NO", the following Medicare IM given date fields will be blank) Date Medicare IM given:   Date Additional Medicare IM given:    Discharge Disposition:    Per UR Regulation:    If discussed at Long Length of Stay Meetings, dates discussed:    Comments:  07-21-12 Referral for [facility] / PT . Spoke with patient she DOES NOT want home health .  Ronny Flurry RN BSN [phone]

## 2012-07-21 NOTE — Discharge Summary (Signed)
Physician Discharge Summary  [name] UUV:[id number] DOB: January 13, 1934 DOA: 07/18/2012  PCP: Elby Showers, MD  Admit date: 07/18/2012 Discharge date: 07/21/2012  Time spent: 40 minutes  Recommendations for Outpatient Follow-up:  1. Follow up with PMD.     Discharge Diagnoses:  Principal Problem:  *UTI (lower urinary tract infection) Active Problems:  Anemia  Hypokalemia  Thrombocytopenia  DM (diabetes mellitus)  Weakness  Hypocalcemia  Alcohol dependence  Hypomagnesemia   Discharge Condition: Satisfactory.   Diet recommendation: Carbohydrate-Modified.   Filed Weights   07/18/12 2051 07/19/12 0500  Weight: 44.226 kg (97 lb 8 oz) 46.085 kg (101 lb 9.6 oz)    History of present illness:  79 year old female with PMH of alcohol dependence, DM, hypokalemia, hypothyroidism was admitted on 07/18/12 with history of generalized weakness, inability to walk, foul-smelling urine without dysuria and poor oral intake. In the ED, potassium 2.5 and features suggestive of UTI. Patient was admitted for further evaluation and management.   Hospital Course:  1. UTI: Patient presented with generalized weakness, inability to walk, foul-smelling urine. Urinalysis revealed a significant bacteriuria. Managed initially with iv Rocephin, with improvement in wellbeing. Urine cultures revealed 80,000 colonies of multiple bacterial morphotypes, and blood cultures remained negative. Patient was on 07/21/12, transitioned to oral Levaquin, to complete a 7-day course of empiric antibiotics on 07/24/12.  2. Electrolyte abnormalities: Patient was found to have severe hypokalemia, hypomagnesemia and hypocalcemia (Corrected calcium 6.7), likely secondary to alcohol dependence. Repleted as indicated.  3. Alcohol dependence: Patient does indeed abuse alcohol. She was placed on CIWA protocol during this hospitalization, but showed no overt features of alcohol withdrawal. Cessation counseled.  4.  Hypothyroidism: TSH was elevated at 9.583, so Synthroid was initially increased from 112 mcg to 125 mcg daily on 07/19/12. On 07/20/12, I discussed with patient's daughter, Adela Lank via phone, and she has confirmed that patient was non-compliant with her Synthroid, pre-admission. We have therefore, continued pre-admission dose of Synthroid, and patient will follow up with her PMD with repeat TSH in 4-6 weeks.  5. Generalized weakness/failure to thrive: Multifactorial, secondary to UTI, alcohol dependence and electrolyte abnormalities. PT/OT evaluation was carried out and SNF vs Home with 24-hour supervision recommended. Patient has opted for the latter.  6. Prolonged QTC: QTC 524 ms. Likely secondary to above electrolyte abnormalities. Managing as described in #2. Monitored telemetrically. No arrhythmias recorded.  7. DM 2: Controlled on diet/SSI. HBA1C is 6.6.  8. Anemia and thrombocytopenia: Possibly from alcohol abuse. No active bleeding. INR 1.63 suggesting chronic liver disease. Patient may have possible alcoholic cirrhosis with coagulopathy.    Procedures:  See Below.   Consultations:  N/A.   Discharge Exam: Filed Vitals:   07/20/12 1327 07/20/12 2046 07/21/12 0507 07/21/12 1350  BP: 118/66 119/61 126/85 116/96  Pulse: 97 95 81 88  Temp: 98.3 F (36.8 C) 98.1 F (36.7 C) 98.4 F (36.9 C) 98.6 F (37 C)  TempSrc: Oral Oral Oral   Resp: 18 20 18 20   Height:      Weight:      SpO2: 100% 100% 99% 98%    General: Comfortable, alert, communicative, fully oriented, not short of breath at rest.  HEENT: Mild clinical pallor, no jaundice, no conjunctival injection or discharge. Hydration is satisfactory.  NECK: Supple, JVP not seen, no carotid bruits, no palpable lymphadenopathy, no palpable goiter.  CHEST: Clinically clear to auscultation, no wheezes, no crackles.  HEART: Sounds 1 and 2 heard, normal, regular, no murmurs.  ABDOMEN: Full, softly  distended, non-tender, no  palpable organomegaly, no palpable masses, normal bowel sounds.  GENITALIA: Not examined.  LOWER EXTREMITIES: No pitting edema, palpable peripheral pulses.  MUSCULOSKELETAL SYSTEM: Generalized osteoarthritic changes, otherwise, normal.  CENTRAL NERVOUS SYSTEM: No focal neurologic deficit on gross examination.  Discharge Instructions      Discharge Orders    Future Orders Please Complete By Expires   Diet Carb Modified      Increase activity slowly          Medication List     As of 07/21/2012  1:51 PM    TAKE these medications         calcium carbonate 500 MG chewable tablet   Commonly known as: TUMS - dosed in mg elemental calcium   Chew 1 tablet (200 mg of elemental calcium total) by mouth 3 (three) times daily.      folic acid 1 MG tablet   Commonly known as: FOLVITE   Take 1 tablet (1 mg total) by mouth daily.      levofloxacin 250 MG tablet   Commonly known as: LEVAQUIN   Take 1 tablet (250 mg total) by mouth daily.      levothyroxine 112 MCG tablet   Commonly known as: SYNTHROID, LEVOTHROID   Take 112 mcg by mouth daily.      magnesium oxide 400 (241.3 MG) MG tablet   Commonly known as: MAG-OX   Take 1 tablet (400 mg total) by mouth 2 (two) times daily.      multivitamin with minerals Tabs   Take 1 tablet by mouth daily.      potassium chloride 20 MEQ packet   Commonly known as: KLOR-CON   Take 20 mEq by mouth 2 (two) times daily.      thiamine 100 MG tablet   Take 1 tablet (100 mg total) by mouth daily.      vitamin D (CHOLECALCIFEROL) 400 UNITS tablet   Take 1 tablet (400 Units total) by mouth 2 (two) times daily.         Follow-up Information    Schedule an appointment as soon as possible for a visit with Elby Showers, MD.   Contact information:   [address] [place] [address]           The results of significant diagnostics from this hospitalization (including imaging, microbiology, ancillary and laboratory) are listed  below for reference.    Significant Diagnostic Studies: Dg Chest 2 View  07/18/2012  *RADIOLOGY REPORT*  Clinical Data: Weakness, cough.  CHEST - 2 VIEW  Comparison: 02/11/2012  Findings: Cardiomegaly.  Lungs are clear.  No effusions.  No acute bony abnormality.  IMPRESSION: Cardiomegaly.  No acute findings.   Original Report Authenticated By: Charlett Nose, M.D.     Microbiology: Recent Results (from the past 240 hour(s))  URINE CULTURE     Status: Normal   Collection Time   07/18/12  5:15 PM      Component Value Range Status Comment   Specimen Description URINE, CLEAN CATCH   Final    Special Requests NONE   Final    Culture  Setup Time 07/18/2012 19:06   Final    Colony Count 80,000 COLONIES/ML   Final    Culture     Final    Value: Multiple bacterial morphotypes present, none predominant. Suggest appropriate recollection if clinically indicated.   Report Status 07/20/2012 FINAL   Final   CULTURE, BLOOD (ROUTINE X 2)     Status:  Normal (Preliminary result)   Collection Time   07/18/12  9:06 PM      Component Value Range Status Comment   Specimen Description BLOOD RIGHT ARM   Final    Special Requests BOTTLES DRAWN AEROBIC AND ANAEROBIC 10CC EA   Final    Culture  Setup Time 07/19/2012 04:53   Final    Culture     Final    Value:        BLOOD CULTURE RECEIVED NO GROWTH TO DATE CULTURE WILL BE HELD FOR 5 DAYS BEFORE ISSUING A FINAL NEGATIVE REPORT   Report Status PENDING   Incomplete   CULTURE, BLOOD (ROUTINE X 2)     Status: Normal (Preliminary result)   Collection Time   07/18/12  9:12 PM      Component Value Range Status Comment   Specimen Description BLOOD RIGHT HAND   Final    Special Requests BOTTLES DRAWN AEROBIC AND ANAEROBIC 10CC EA   Final    Culture  Setup Time 07/19/2012 04:53   Final    Culture     Final    Value:        BLOOD CULTURE RECEIVED NO GROWTH TO DATE CULTURE WILL BE HELD FOR 5 DAYS BEFORE ISSUING A FINAL NEGATIVE REPORT   Report Status PENDING   Incomplete       Labs: Basic Metabolic Panel:  Lab 07/21/12 2956 07/20/12 0420 07/19/12 2015 07/19/12 0630 07/18/12 2122 07/18/12 1830 07/18/12 1550  NA 139 140 141 142 -- -- 139  K 3.6 3.8 3.8 2.8* -- -- 2.7*  CL 101 101 100 101 -- -- 90*  CO2 28 25 24 26  -- -- 21  GLUCOSE 132* 109* 136* 93 -- -- 116*  BUN 5* 7 9 13  -- -- 17  CREATININE 0.55 0.53 0.57 0.61 0.71 -- --  CALCIUM 7.9* 6.8* 6.9* 5.9* -- -- 6.7*  MG 1.3* 1.4* 1.9 0.7* -- 0.8* --  PHOS -- -- -- -- -- -- --   Liver Function Tests:  Lab 07/21/12 0440 07/19/12 0630 07/18/12 1550  AST 106* 115* 147*  ALT 28 29 38*  ALKPHOS 122* 87 112  BILITOT 0.7 1.0 1.7*  PROT 5.7* 5.6* 7.0  ALBUMIN 2.8* 3.0* 3.7   No results found for this basename: LIPASE:5,AMYLASE:5 in the last 168 hours No results found for this basename: AMMONIA:5 in the last 168 hours CBC:  Lab 07/21/12 0440 07/20/12 0420 07/19/12 0630 07/18/12 2122 07/18/12 1550  WBC 7.9 6.7 5.8 6.1 5.9  NEUTROABS -- -- -- -- 3.2  HGB 11.2* 10.7* 10.1* 11.7* 12.4  HCT 31.9* 30.6* 28.6* 32.7* 34.8*  MCV 97.3 96.5 95.0 94.8 95.3  PLT 120* 113* 120* 142* 143*   Cardiac Enzymes:  Lab 07/18/12 1529  CKTOTAL --  CKMB --  CKMBINDEX --  TROPONINI <0.30   BNP: BNP (last 3 results) No results found for this basename: PROBNP:3 in the last 8760 hours CBG:  Lab 07/21/12 1218 07/21/12 0748 07/20/12 2057 07/20/12 1706 07/20/12 1220  GLUCAP 147* 100* 126* 195* 121*       Signed:  Dorean Daniello,CHRISTOPHER  [facility] 07/21/2012, 1:51 PM

## 2012-07-25 LAB — CULTURE, BLOOD (ROUTINE X 2): Culture: NO GROWTH

## 2012-11-29 IMAGING — CT CT HEAD W/O CM
1 of 2 series · 16 of 30 positions shown, 20 images · non-contrast
Comparison: 12/07/2011

CLINICAL DATA: Fall, palpable abnormality over the right forehead
per patient

CT HEAD WITHOUT CONTRAST
TECHNIQUE: Contiguous axial images were obtained from the base of
the skull through the vertex without contrast.

[Series 3: head trauma 2.4 h60s · axial · 0.43mm/px · z∈[-198,-75]mm · 16 of 60 slices shown, 20 images]
[im 4/60  brain]
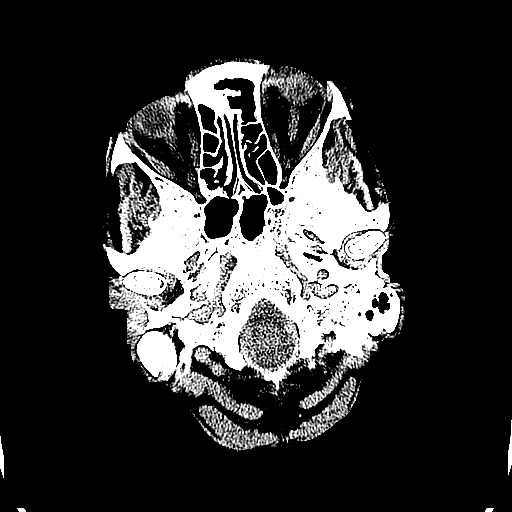
[im 4/60  bone]
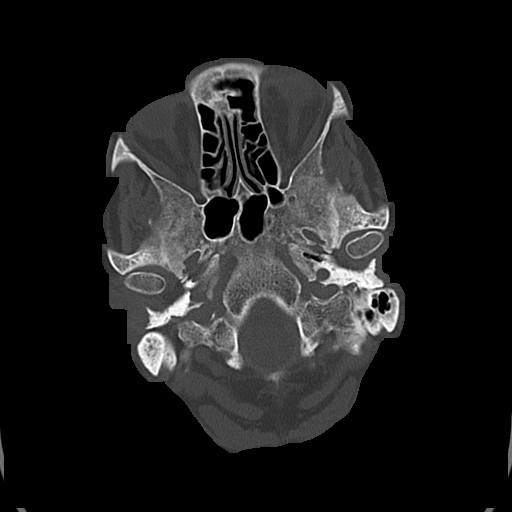
[im 7/60  brain]
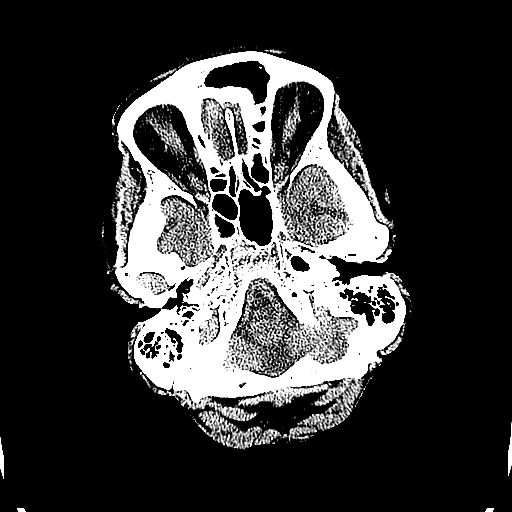
[im 10/60  brain]
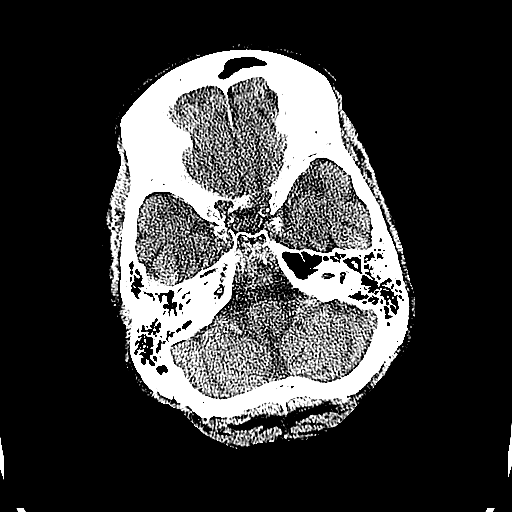
[im 13/60  brain]
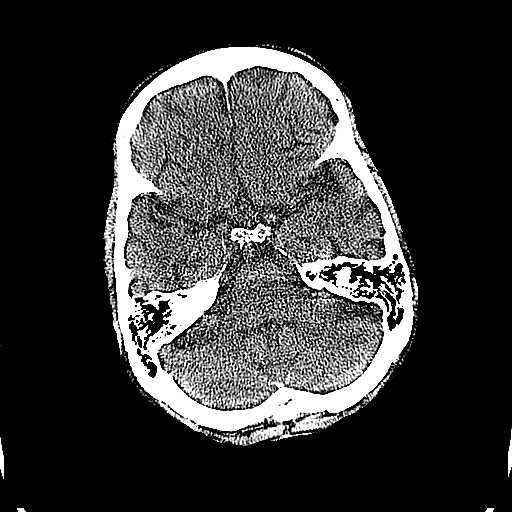
[im 19/60  brain]
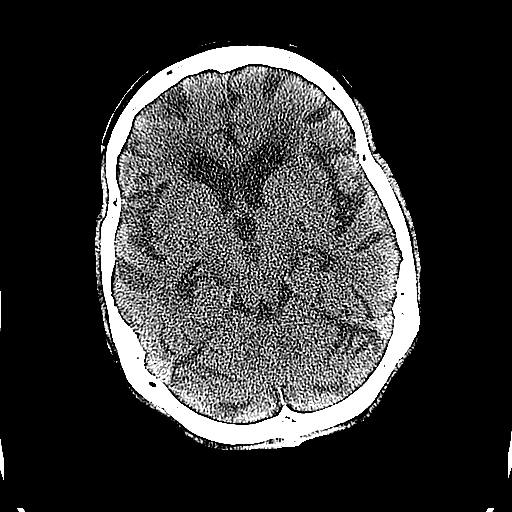
[im 19/60  bone]
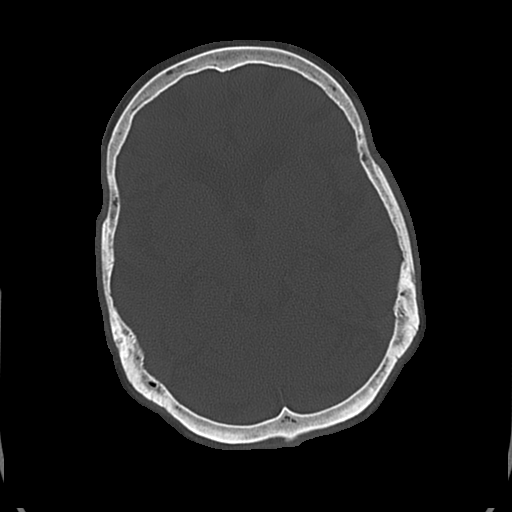
[im 22/60  brain]
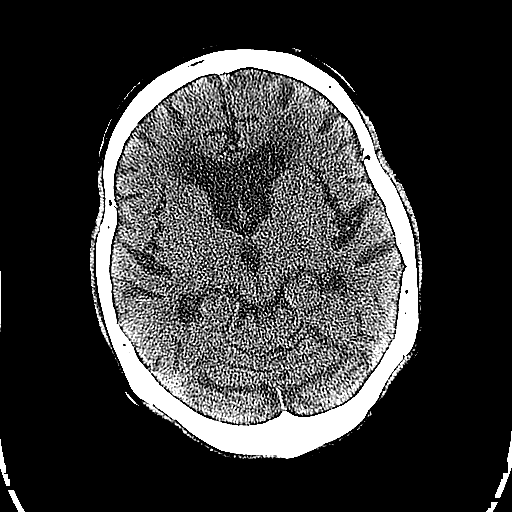
[im 25/60  brain]
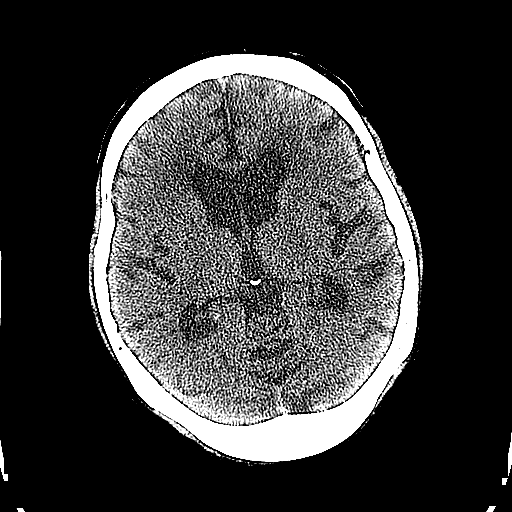
[im 28/60  brain]
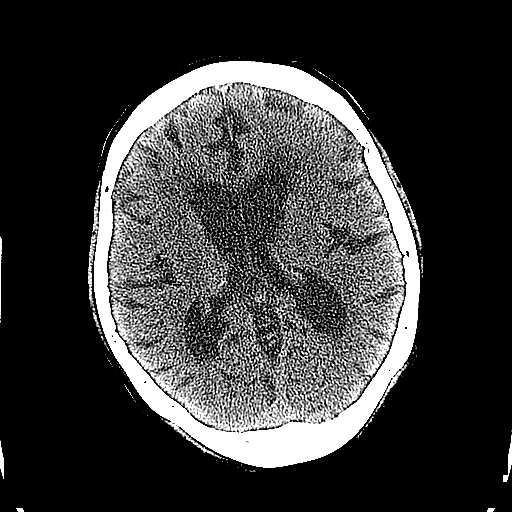
[im 32/60  brain]
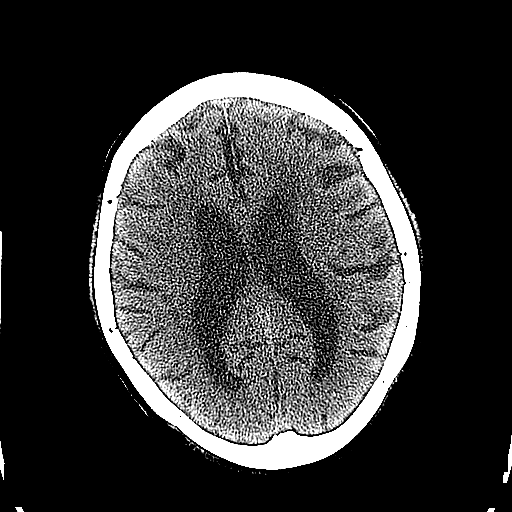
[im 32/60  bone]
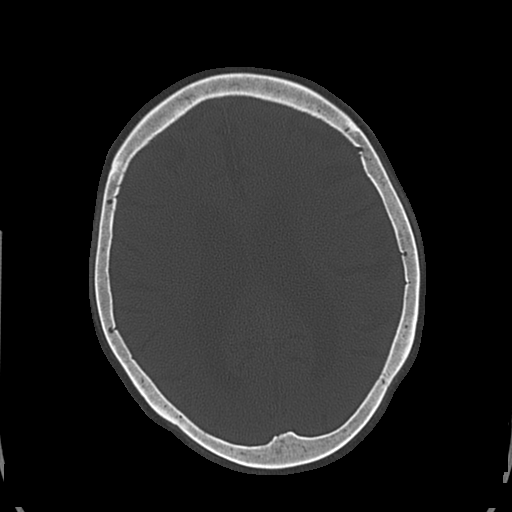
[im 35/60  brain]
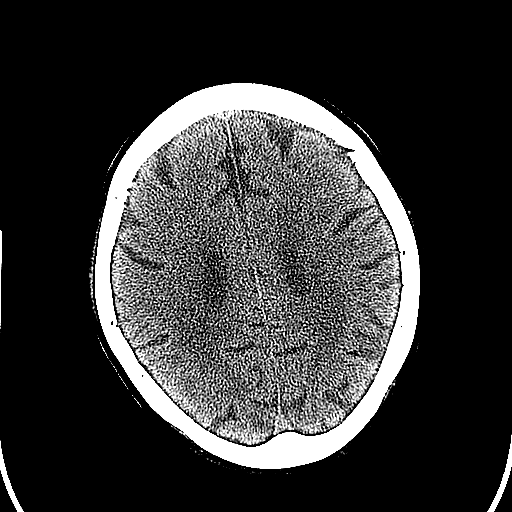
[im 38/60  brain]
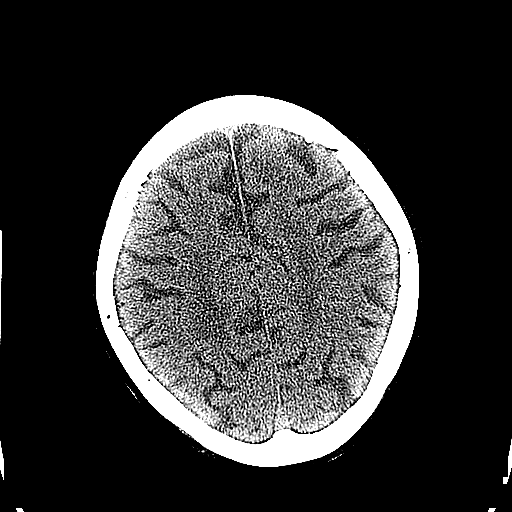
[im 41/60  brain]
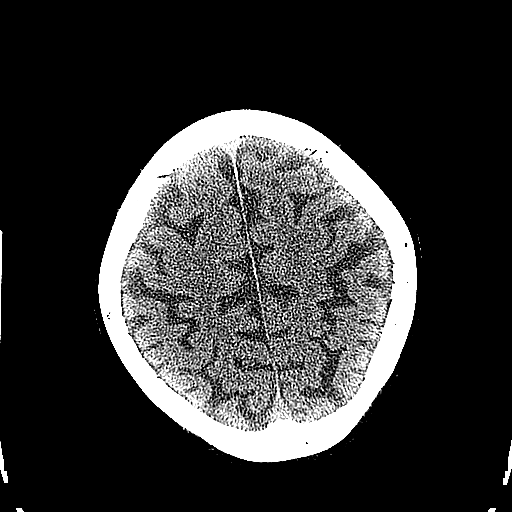
[im 47/60  brain]
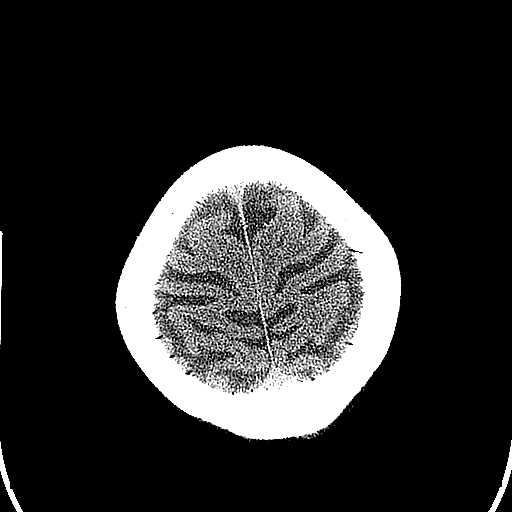
[im 47/60  bone]
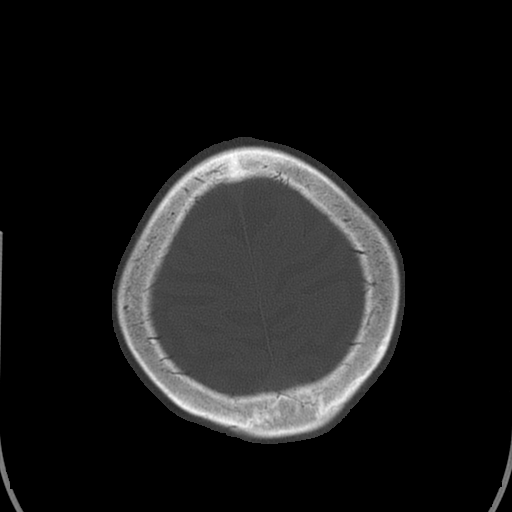
[im 50/60  brain]
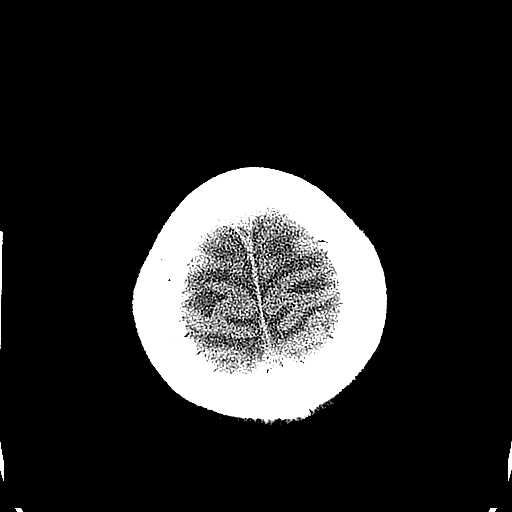
[im 53/60  brain]
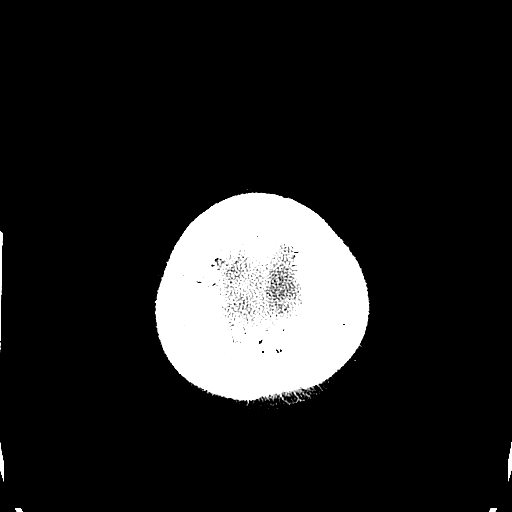
[im 56/60  brain]
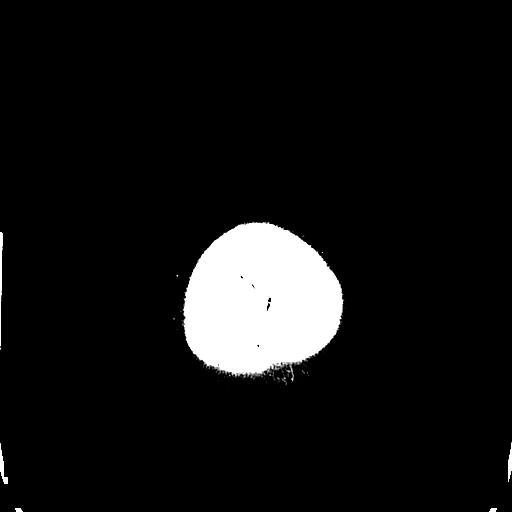

[16 of 30 positions shown; findings below may reference images not displayed]

FINDINGS: There is only minimal stranding at the high left
occipital lobe overlying scalp region in the area of previously
seen scalp hematoma.  Left supraorbital scalp swelling/stranding is
stable allowing for differences in technique.  Orbits and paranasal
sinuses are unremarkable. Cortical volume loss noted with
proportional ventricular prominence.  Periventricular white matter
hypodensity likely indicates small vessel ischemic change.  No
acute hemorrhage, acute infarction, or mass lesion is identified.
IMPRESSION: No new acute intracranial finding.  Chronic findings as above.

Near interval resolution of previously seen left occipital scalp
hematoma.

## 2012-12-01 IMAGING — CT CT HEAD W/O CM
1 series · 16 of 30 positions shown, 20 images · non-contrast
Comparison: 01/04/2012 and multiple previous

CLINICAL DATA: Sudden onset of confusion.

CT HEAD WITHOUT CONTRAST
TECHNIQUE: Contiguous axial images were obtained from the base of
the skull through the vertex without contrast.

[Series 2: head trauma 4.8 h37s · axial · 0.48mm/px · z∈[+1229,+1383]mm · 16 of 36 slices shown, 20 images]
[im 2/36  brain]
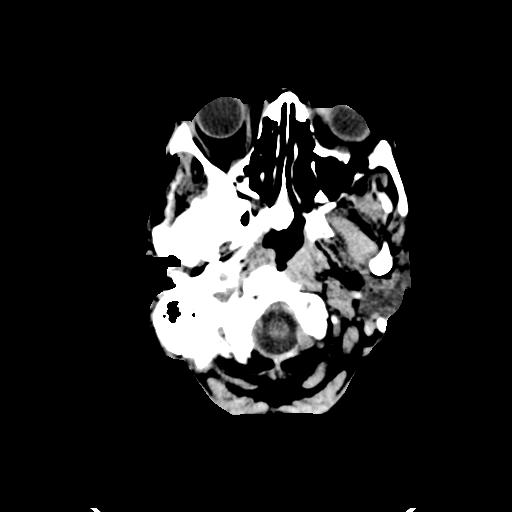
[im 2/36  bone]
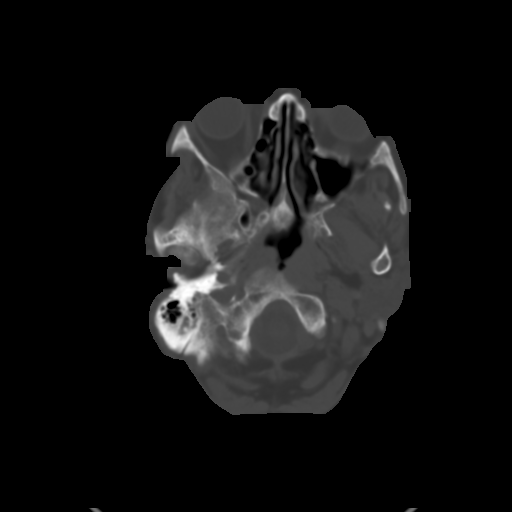
[im 4/36  brain]
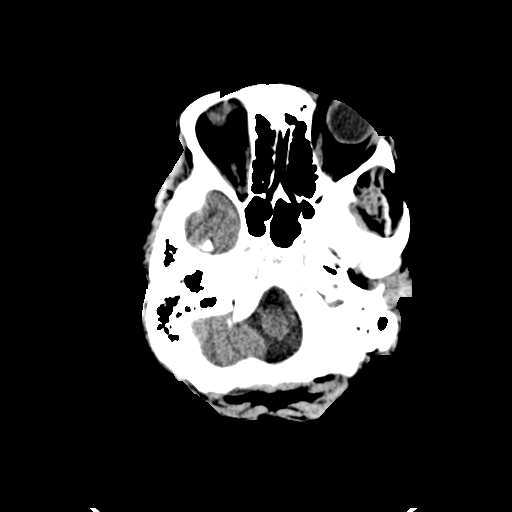
[im 7/36  brain]
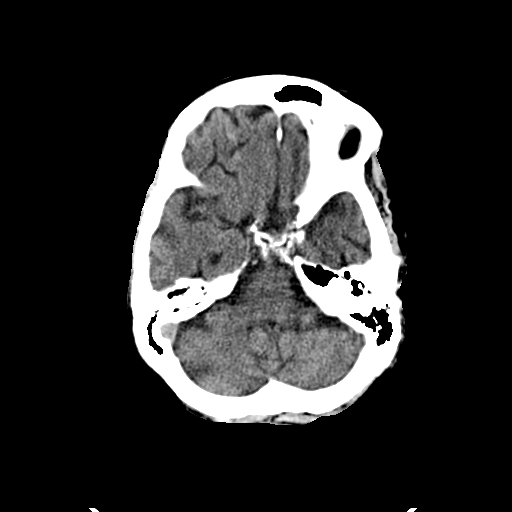
[im 9/36  brain]
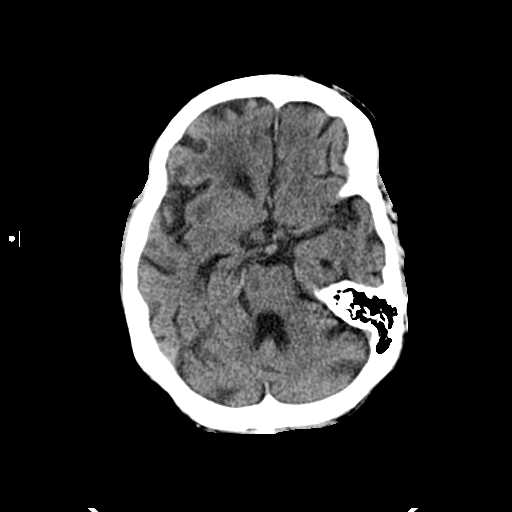
[im 10/36  brain]
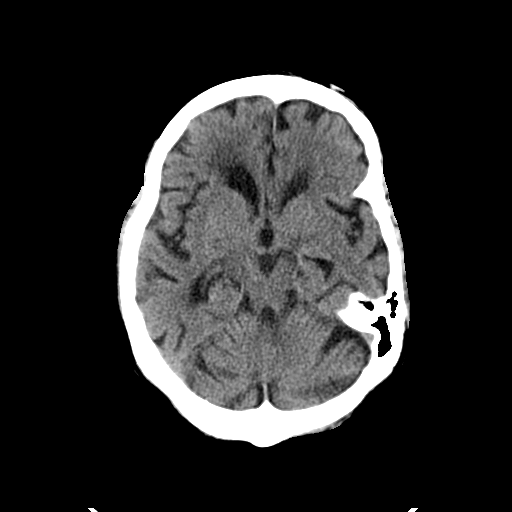
[im 10/36  bone]
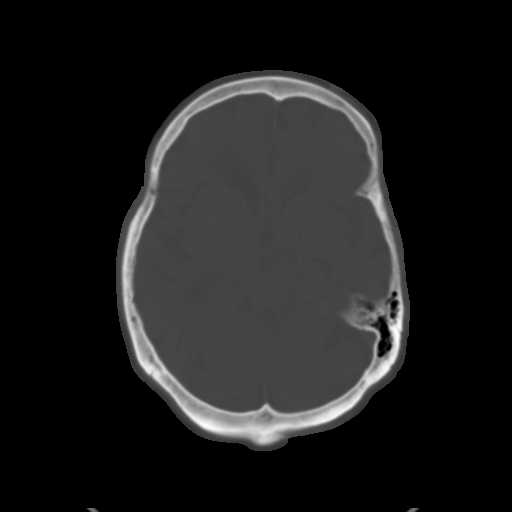
[im 13/36  brain]
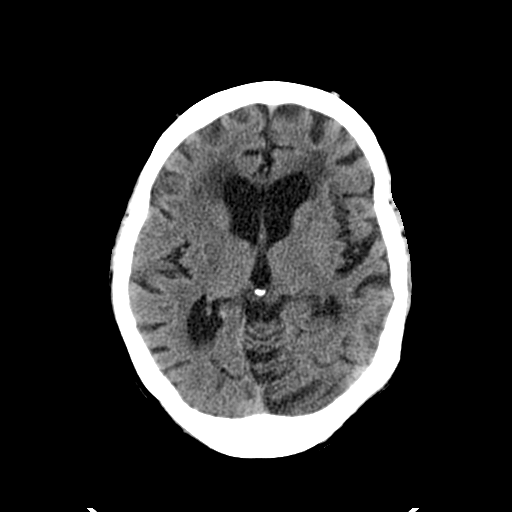
[im 15/36  brain]
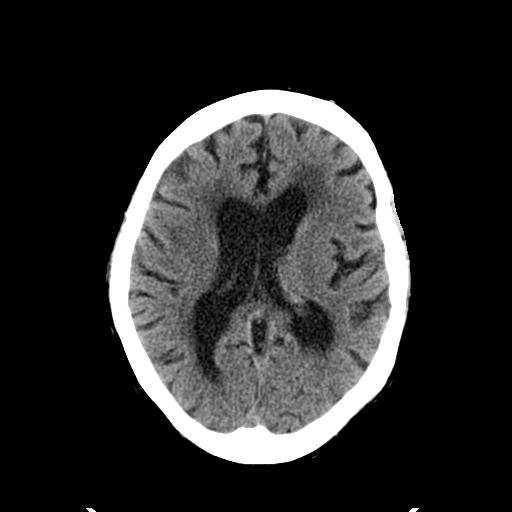
[im 17/36  brain]
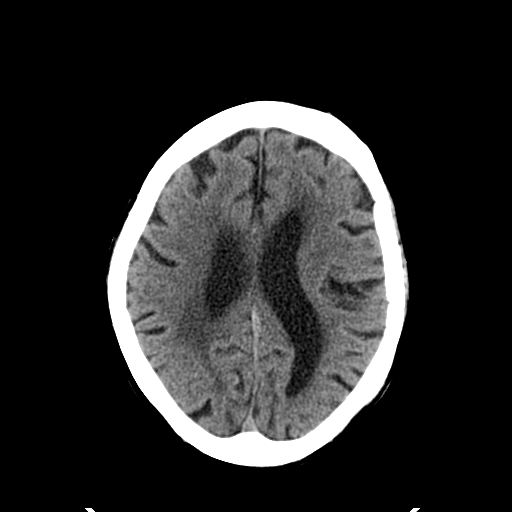
[im 19/36  brain]
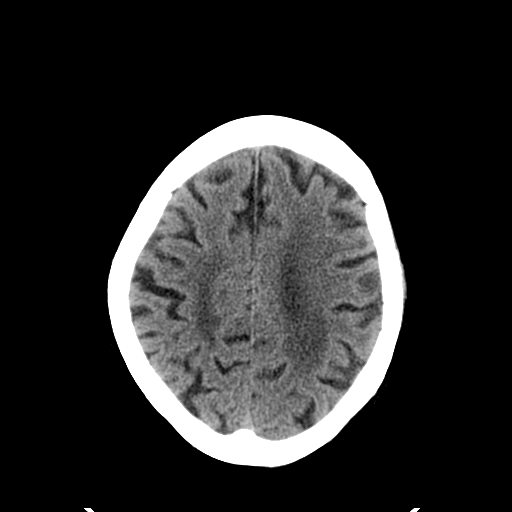
[im 19/36  bone]
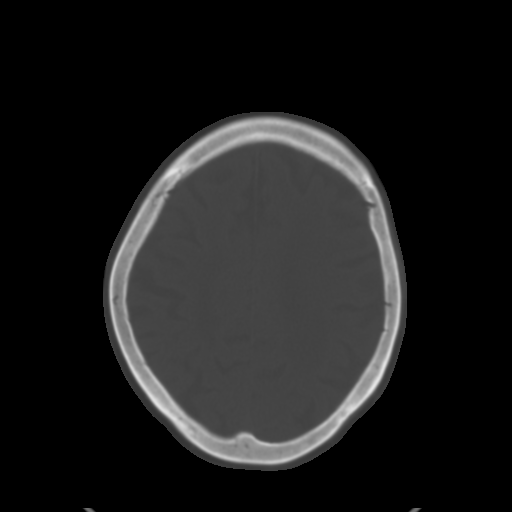
[im 21/36  brain]
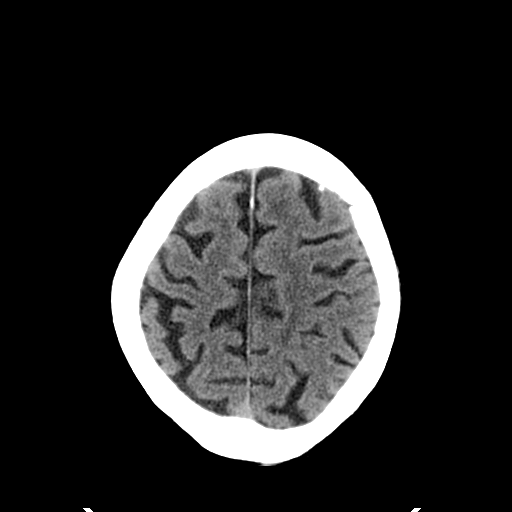
[im 23/36  brain]
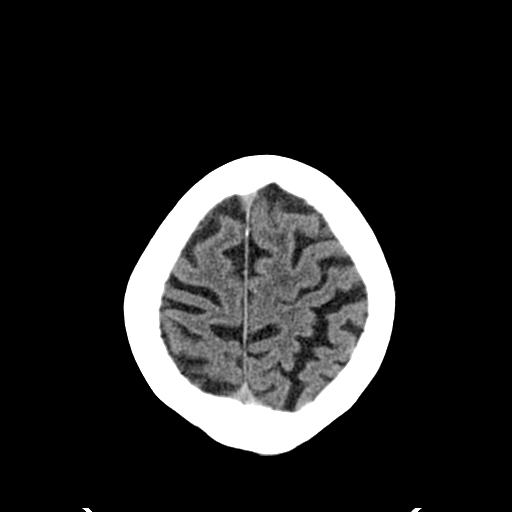
[im 26/36  brain]
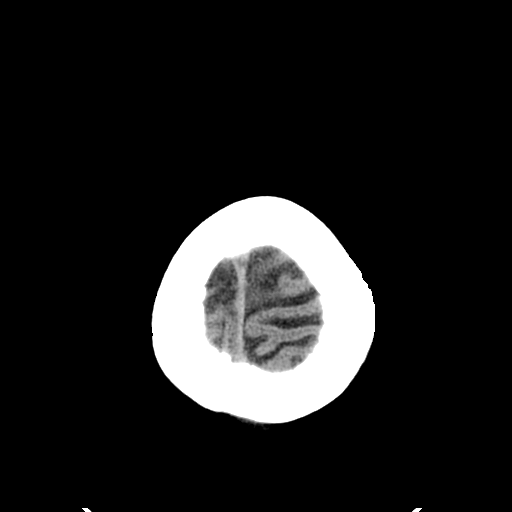
[im 27/36  brain]
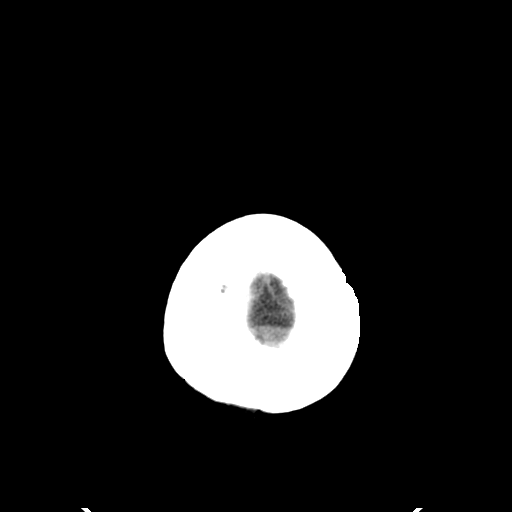
[im 27/36  bone]
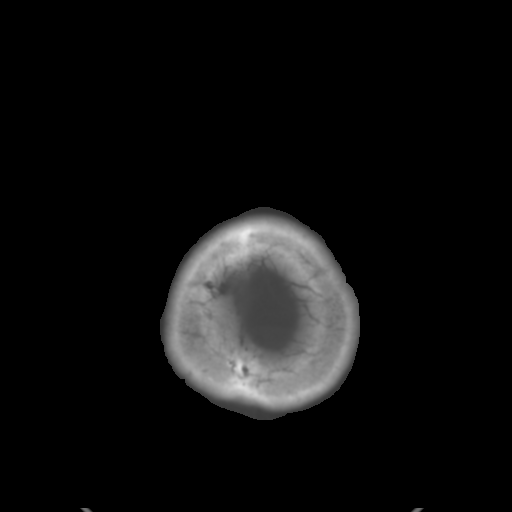
[im 29/36  brain]
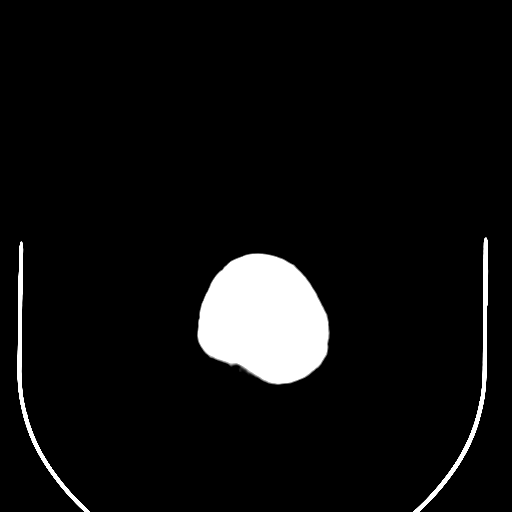
[im 32/36  brain]
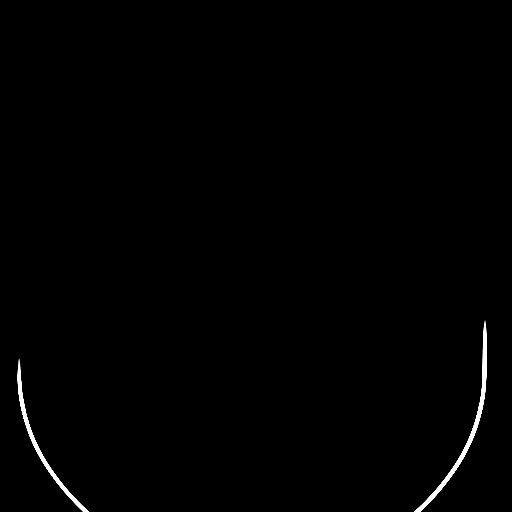
[im 34/36  brain]
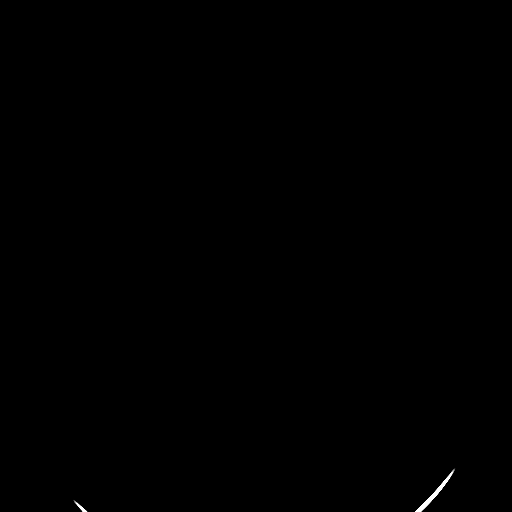

[16 of 30 positions shown; findings below may reference images not displayed]

FINDINGS: The brain shows generalized atrophy.  There is extensive
chronic small vessel change throughout the hemispheric white
matter.  No sign of acute infarction, mass lesion, hemorrhage,
hydrocephalus or extra-axial collection.  No calvarial abnormality.
Sinuses are clear.  There is atherosclerotic calcification of the
major vessels at the base of the brain.
IMPRESSION: Atrophy.  Chronic small vessel disease.  No acute finding.

## 2013-01-31 ENCOUNTER — Inpatient Hospital Stay (HOSPITAL_COMMUNITY)
Admission: EM | Admit: 2013-01-31 | Discharge: 2013-02-06 | DRG: 689 | Disposition: A | Discharge status: Skilled Nursing Facility | Payer: Medicare Other | Attending: Internal Medicine | Admitting: Internal Medicine

## 2013-01-31 ENCOUNTER — Emergency Department (HOSPITAL_COMMUNITY): Payer: Medicare Other

## 2013-01-31 ENCOUNTER — Encounter (HOSPITAL_COMMUNITY): Payer: Self-pay | Admitting: Emergency Medicine

## 2013-01-31 DIAGNOSIS — K5900 Constipation, unspecified: Secondary | ICD-10-CM | POA: Diagnosis not present

## 2013-01-31 DIAGNOSIS — A498 Other bacterial infections of unspecified site: Secondary | ICD-10-CM | POA: Diagnosis present

## 2013-01-31 DIAGNOSIS — E039 Hypothyroidism, unspecified: Secondary | ICD-10-CM | POA: Diagnosis present

## 2013-01-31 DIAGNOSIS — D638 Anemia in other chronic diseases classified elsewhere: Secondary | ICD-10-CM | POA: Diagnosis present

## 2013-01-31 DIAGNOSIS — F17200 Nicotine dependence, unspecified, uncomplicated: Secondary | ICD-10-CM | POA: Diagnosis present

## 2013-01-31 DIAGNOSIS — D649 Anemia, unspecified: Secondary | ICD-10-CM

## 2013-01-31 DIAGNOSIS — D72829 Elevated white blood cell count, unspecified: Secondary | ICD-10-CM | POA: Diagnosis present

## 2013-01-31 DIAGNOSIS — G9341 Metabolic encephalopathy: Secondary | ICD-10-CM

## 2013-01-31 DIAGNOSIS — E781 Pure hyperglyceridemia: Secondary | ICD-10-CM

## 2013-01-31 DIAGNOSIS — R7402 Elevation of levels of lactic acid dehydrogenase (LDH): Secondary | ICD-10-CM | POA: Diagnosis present

## 2013-01-31 DIAGNOSIS — R4182 Altered mental status, unspecified: Secondary | ICD-10-CM

## 2013-01-31 DIAGNOSIS — F10129 Alcohol abuse with intoxication, unspecified: Secondary | ICD-10-CM

## 2013-01-31 DIAGNOSIS — R188 Other ascites: Secondary | ICD-10-CM | POA: Diagnosis present

## 2013-01-31 DIAGNOSIS — Z6830 Body mass index (BMI) 30.0-30.9, adult: Secondary | ICD-10-CM

## 2013-01-31 DIAGNOSIS — J9600 Acute respiratory failure, unspecified whether with hypoxia or hypercapnia: Secondary | ICD-10-CM

## 2013-01-31 DIAGNOSIS — I10 Essential (primary) hypertension: Secondary | ICD-10-CM | POA: Diagnosis present

## 2013-01-31 DIAGNOSIS — K219 Gastro-esophageal reflux disease without esophagitis: Secondary | ICD-10-CM | POA: Diagnosis present

## 2013-01-31 DIAGNOSIS — N179 Acute kidney failure, unspecified: Secondary | ICD-10-CM

## 2013-01-31 DIAGNOSIS — E43 Unspecified severe protein-calorie malnutrition: Secondary | ICD-10-CM | POA: Diagnosis present

## 2013-01-31 DIAGNOSIS — K7689 Other specified diseases of liver: Secondary | ICD-10-CM | POA: Diagnosis present

## 2013-01-31 DIAGNOSIS — R7401 Elevation of levels of liver transaminase levels: Secondary | ICD-10-CM | POA: Diagnosis present

## 2013-01-31 DIAGNOSIS — E875 Hyperkalemia: Secondary | ICD-10-CM | POA: Diagnosis not present

## 2013-01-31 DIAGNOSIS — E119 Type 2 diabetes mellitus without complications: Secondary | ICD-10-CM

## 2013-01-31 DIAGNOSIS — E1169 Type 2 diabetes mellitus with other specified complication: Secondary | ICD-10-CM | POA: Diagnosis present

## 2013-01-31 DIAGNOSIS — E860 Dehydration: Secondary | ICD-10-CM | POA: Diagnosis present

## 2013-01-31 DIAGNOSIS — R17 Unspecified jaundice: Secondary | ICD-10-CM | POA: Diagnosis present

## 2013-01-31 DIAGNOSIS — F0390 Unspecified dementia without behavioral disturbance: Secondary | ICD-10-CM | POA: Diagnosis present

## 2013-01-31 DIAGNOSIS — E876 Hypokalemia: Secondary | ICD-10-CM | POA: Diagnosis present

## 2013-01-31 DIAGNOSIS — N390 Urinary tract infection, site not specified: Principal | ICD-10-CM | POA: Diagnosis present

## 2013-01-31 DIAGNOSIS — G9340 Encephalopathy, unspecified: Secondary | ICD-10-CM

## 2013-01-31 DIAGNOSIS — F1020 Alcohol dependence, uncomplicated: Secondary | ICD-10-CM

## 2013-01-31 LAB — URINALYSIS, ROUTINE W REFLEX MICROSCOPIC
Hgb urine dipstick: NEGATIVE
Nitrite: NEGATIVE
Protein, ur: 30 mg/dL — AB
Specific Gravity, Urine: 1.016 (ref 1.005–1.030)
Urobilinogen, UA: >8 mg/dL — ABNORMAL HIGH (ref 0.0–1.0)

## 2013-01-31 LAB — COMPREHENSIVE METABOLIC PANEL
ALT: 19 U/L (ref 0–35)
AST: 61 U/L — ABNORMAL HIGH (ref 0–37)
Albumin: 2.6 g/dL — ABNORMAL LOW (ref 3.5–5.2)
Chloride: 87 mEq/L — ABNORMAL LOW (ref 96–112)
Creatinine, Ser: 1.04 mg/dL (ref 0.50–1.10)
GFR calc Af Amer: 59 mL/min — ABNORMAL LOW (ref >90)
GFR calc non Af Amer: 50 mL/min — ABNORMAL LOW (ref >90)
Potassium: 2.5 mEq/L — CL (ref 3.5–5.1)
Sodium: 138 mEq/L (ref 135–145)
Total Bilirubin: 5.4 mg/dL — ABNORMAL HIGH (ref 0.3–1.2)

## 2013-01-31 LAB — CBC
HCT: 29.8 % — ABNORMAL LOW (ref 36.0–46.0)
Hemoglobin: 10.4 g/dL — ABNORMAL LOW (ref 12.0–15.0)
RDW: 18.4 % — ABNORMAL HIGH (ref 11.5–15.5)
WBC: 12.7 10*3/uL — ABNORMAL HIGH (ref 4.0–10.5)

## 2013-01-31 LAB — CBC WITH DIFFERENTIAL/PLATELET
Basophils Absolute: 0 10*3/uL (ref 0.0–0.1)
Basophils Relative: 0 % (ref 0–1)
MCHC: 34.4 g/dL (ref 30.0–36.0)
Monocytes Absolute: 0.5 10*3/uL (ref 0.1–1.0)
Neutro Abs: 9.3 10*3/uL — ABNORMAL HIGH (ref 1.7–7.7)
Neutrophils Relative %: 82 % — ABNORMAL HIGH (ref 43–77)
Platelets: 184 10*3/uL (ref 150–400)
RDW: 18.4 % — ABNORMAL HIGH (ref 11.5–15.5)
WBC: 11.3 10*3/uL — ABNORMAL HIGH (ref 4.0–10.5)

## 2013-01-31 LAB — CREATININE, SERUM
Creatinine, Ser: 0.92 mg/dL (ref 0.50–1.10)
GFR calc Af Amer: 68 mL/min — ABNORMAL LOW (ref >90)
GFR calc non Af Amer: 59 mL/min — ABNORMAL LOW (ref >90)

## 2013-01-31 LAB — URINE MICROSCOPIC-ADD ON

## 2013-01-31 LAB — LIPASE, BLOOD: Lipase: 7 U/L — ABNORMAL LOW (ref 11–59)

## 2013-01-31 LAB — GLUCOSE, CAPILLARY

## 2013-01-31 MED ORDER — OXYCODONE HCL 5 MG PO TABS
5.0000 mg | ORAL_TABLET | ORAL | Status: DC | PRN
  Administered 2013-02-01: 5 mg via ORAL
  Filled 2013-01-31: qty 1

## 2013-01-31 MED ORDER — FOLIC ACID 1 MG PO TABS
1.0000 mg | ORAL_TABLET | Freq: Every day | ORAL | Status: DC
  Administered 2013-01-31 – 2013-02-06 (×7): 1 mg via ORAL
  Filled 2013-01-31 (×7): qty 1

## 2013-01-31 MED ORDER — LEVOTHYROXINE SODIUM 50 MCG PO TABS
50.0000 ug | ORAL_TABLET | Freq: Every day | ORAL | Status: DC
  Administered 2013-02-01 – 2013-02-06 (×6): 50 ug via ORAL
  Filled 2013-01-31 (×7): qty 1

## 2013-01-31 MED ORDER — HYDROMORPHONE HCL PF 1 MG/ML IJ SOLN: 0.5000 mg | INTRAMUSCULAR | Status: DC | PRN

## 2013-01-31 MED ORDER — SODIUM CHLORIDE 0.9 % IV BOLUS (SEPSIS)
1000.0000 mL | Freq: Once | INTRAVENOUS | Status: AC
  Administered 2013-01-31: 1000 mL via INTRAVENOUS

## 2013-01-31 MED ORDER — ONDANSETRON HCL 4 MG/2ML IJ SOLN
4.0000 mg | Freq: Four times a day (QID) | INTRAMUSCULAR | Status: DC | PRN
  Administered 2013-02-04: 4 mg via INTRAVENOUS
  Filled 2013-01-31: qty 2

## 2013-01-31 MED ORDER — PANTOPRAZOLE SODIUM 40 MG PO TBEC
40.0000 mg | DELAYED_RELEASE_TABLET | Freq: Every day | ORAL | Status: DC
  Administered 2013-01-31 – 2013-02-06 (×7): 40 mg via ORAL
  Filled 2013-01-31 (×7): qty 1

## 2013-01-31 MED ORDER — DEXTROSE 5 % IV SOLN
1.0000 g | Freq: Once | INTRAVENOUS | Status: AC
  Administered 2013-01-31: 1 g via INTRAVENOUS
  Filled 2013-01-31: qty 10

## 2013-01-31 MED ORDER — KCL IN DEXTROSE-NACL 40-5-0.45 MEQ/L-%-% IV SOLN
INTRAVENOUS | Status: DC
  Administered 2013-01-31: 23:00:00 via INTRAVENOUS
  Administered 2013-02-01: 75 mL/h via INTRAVENOUS
  Filled 2013-01-31 (×4): qty 1000

## 2013-01-31 MED ORDER — SODIUM CHLORIDE 0.9 % IJ SOLN
3.0000 mL | Freq: Two times a day (BID) | INTRAMUSCULAR | Status: DC
  Administered 2013-01-31 – 2013-02-06 (×9): 3 mL via INTRAVENOUS

## 2013-01-31 MED ORDER — DOCUSATE SODIUM 100 MG PO CAPS
100.0000 mg | ORAL_CAPSULE | Freq: Two times a day (BID) | ORAL | Status: DC
  Administered 2013-01-31 – 2013-02-06 (×11): 100 mg via ORAL
  Filled 2013-01-31 (×13): qty 1

## 2013-01-31 MED ORDER — ADULT MULTIVITAMIN W/MINERALS CH
1.0000 | ORAL_TABLET | Freq: Every day | ORAL | Status: DC
  Administered 2013-01-31 – 2013-02-06 (×7): 1 via ORAL
  Filled 2013-01-31 (×7): qty 1

## 2013-01-31 MED ORDER — LORAZEPAM 1 MG PO TABS: 1.0000 mg | ORAL_TABLET | Freq: Four times a day (QID) | ORAL | Status: DC | PRN

## 2013-01-31 MED ORDER — INSULIN ASPART 100 UNIT/ML ~~LOC~~ SOLN
0.0000 [IU] | Freq: Three times a day (TID) | SUBCUTANEOUS | Status: DC
  Administered 2013-02-01: 3 [IU] via SUBCUTANEOUS
  Administered 2013-02-01: 9 [IU] via SUBCUTANEOUS
  Administered 2013-02-01 – 2013-02-02 (×2): 2 [IU] via SUBCUTANEOUS
  Administered 2013-02-02: 1 [IU] via SUBCUTANEOUS
  Administered 2013-02-02: 3 [IU] via SUBCUTANEOUS

## 2013-01-31 MED ORDER — LORAZEPAM 2 MG/ML IJ SOLN
0.0000 mg | Freq: Two times a day (BID) | INTRAMUSCULAR | Status: DC
  Administered 2013-02-02: 1 mg via INTRAVENOUS
  Filled 2013-01-31: qty 1

## 2013-01-31 MED ORDER — ALBUTEROL SULFATE (5 MG/ML) 0.5% IN NEBU
2.5000 mg | INHALATION_SOLUTION | Freq: Four times a day (QID) | RESPIRATORY_TRACT | Status: DC
  Administered 2013-02-01 – 2013-02-02 (×6): 2.5 mg via RESPIRATORY_TRACT
  Filled 2013-01-31 (×7): qty 0.5

## 2013-01-31 MED ORDER — LORAZEPAM 2 MG/ML IJ SOLN: 0.0000 mg | Freq: Four times a day (QID) | INTRAMUSCULAR | Status: AC

## 2013-01-31 MED ORDER — ZOLPIDEM TARTRATE 5 MG PO TABS: 5.0000 mg | ORAL_TABLET | Freq: Every evening | ORAL | Status: DC | PRN

## 2013-01-31 MED ORDER — SODIUM CHLORIDE 0.9 % IV SOLN
2.0000 g | Freq: Once | INTRAVENOUS | Status: AC
  Administered 2013-01-31: 2 g via INTRAVENOUS
  Filled 2013-01-31: qty 20

## 2013-01-31 MED ORDER — POTASSIUM CHLORIDE CRYS ER 20 MEQ PO TBCR
40.0000 meq | EXTENDED_RELEASE_TABLET | Freq: Once | ORAL | Status: AC
  Administered 2013-01-31: 40 meq via ORAL
  Filled 2013-01-31: qty 2

## 2013-01-31 MED ORDER — POTASSIUM CHLORIDE 10 MEQ/100ML IV SOLN
10.0000 meq | Freq: Once | INTRAVENOUS | Status: AC
  Administered 2013-01-31: 10 meq via INTRAVENOUS
  Filled 2013-01-31: qty 100

## 2013-01-31 MED ORDER — LORAZEPAM 2 MG/ML IJ SOLN
1.0000 mg | Freq: Four times a day (QID) | INTRAMUSCULAR | Status: DC | PRN
  Administered 2013-02-03: 1 mg via INTRAVENOUS
  Filled 2013-01-31: qty 1

## 2013-01-31 MED ORDER — BISACODYL 10 MG RE SUPP: 10.0000 mg | Freq: Every day | RECTAL | Status: DC | PRN

## 2013-01-31 MED ORDER — ENOXAPARIN SODIUM 40 MG/0.4ML ~~LOC~~ SOLN
40.0000 mg | SUBCUTANEOUS | Status: DC
  Administered 2013-01-31 – 2013-02-05 (×6): 40 mg via SUBCUTANEOUS
  Filled 2013-01-31 (×7): qty 0.4

## 2013-01-31 MED ORDER — MAGNESIUM CITRATE PO SOLN: 1.0000 | Freq: Once | ORAL | Status: AC | PRN

## 2013-01-31 MED ORDER — IPRATROPIUM BROMIDE 0.02 % IN SOLN
0.5000 mg | Freq: Four times a day (QID) | RESPIRATORY_TRACT | Status: DC
  Administered 2013-02-01 – 2013-02-02 (×6): 0.5 mg via RESPIRATORY_TRACT
  Filled 2013-01-31 (×7): qty 2.5

## 2013-01-31 MED ORDER — ALBUTEROL SULFATE (5 MG/ML) 0.5% IN NEBU
2.5000 mg | INHALATION_SOLUTION | RESPIRATORY_TRACT | Status: DC | PRN
  Administered 2013-02-01 – 2013-02-04 (×3): 2.5 mg via RESPIRATORY_TRACT
  Filled 2013-01-31 (×2): qty 0.5

## 2013-01-31 MED ORDER — ONDANSETRON HCL 4 MG PO TABS: 4.0000 mg | ORAL_TABLET | Freq: Four times a day (QID) | ORAL | Status: DC | PRN

## 2013-01-31 NOTE — H&P (Signed)
[facility]                                                                                    Patient Demographics  [name], is a 79 y.o. female  CSN: [id number]  MRN: [id number]  DOB - [birth year]  Admit Date - 01/31/2013  Outpatient Primary MD for the patient is Ginette Otto, MD   With History of -  Past Medical History  Diagnosis Date  . Abdominal pain   . Hypothyroid   . Diabetes mellitus   . COPD (chronic obstructive pulmonary disease)   . GERD (gastroesophageal reflux disease)   . Blood transfusion   . Hypertension   . Urinary tract infection   . Blood dyscrasia     HYPOKALEMIA      Past Surgical History  Procedure Laterality Date  . Abdominal hysterectomy    . Esophagogastroduodenoscopy  05/17/2011    Procedure: ESOPHAGOGASTRODUODENOSCOPY (EGD);  Surgeon: Hart Carwin, MD;  Location: [facility] ENDOSCOPY;  Service: Endoscopy;  Laterality: N/A;    in for   Chief Complaint  Patient presents with  . Fatigue  . Eating Disorder  . Emesis     HPI  [name]  is a 79 y.o. female, who lives alone and [facility], presenting today with 2 weeks history of worsening of well being. According to her daughter in the last 2 weeks she has been getting weaker with nausea vomiting and constipation. There is a history of alcoholism but not sure how much she drinks. The patient denies any pain anywhere on her body but she feels weak. No cough fever or chills reported    Review of Systems    In addition to the HPI above,  No Fever-chills, No Headache, No changes with Vision or hearing, No problems swallowing food or Liquids, No Chest pain, Cough or Shortness of Breath, No Abdominal pain, ,  No Blood in stool or Urine, No dysuria, No new skin rashes or bruises, No new joints pains-aches,  No new weakness, tingling, numbness in any extremity, No recent weight gain or loss, No polyuria,  polydypsia or polyphagia, No significant Mental Stressors.  A full 10 point Review of Systems was done, except as stated above, all other Review of Systems were negative.   Social History History  Substance Use Topics  . Smoking status: Current Every Day Smoker -- 0.50 packs/day for 30 years    Types: Cigarettes  . Smokeless tobacco: Never Used  . Alcohol Use: Yes     Comment: Uknown How much, daughter thinks she drinks heavy, 01/31/13 - "shots on a regular basis"     Family History Family History  Problem Relation Age of Onset  . Stroke Mother      Prior to Admission medications   Medication Sig Start Date End Date Taking? Authorizing Provider  Esomeprazole Magnesium (NEXIUM PO) Take 1 capsule by mouth daily.   Yes Historical Provider, MD  LEVOTHYROXINE SODIUM PO Take 1 tablet by mouth daily.   Yes Historical Provider, MD    No Known Allergies  Physical Exam  Vitals  Blood pressure  113/81, pulse 88, temperature 98.1 F (36.7 C), temperature source Oral, resp. rate 15, last menstrual period 05/17/1983, SpO2 100.00%.   1. General elderly African American female lying in bed looks chronically ill  2., Not Suicidal or Homicidal, confused.  3. No F.N deficits, ALL C.Nerves Intact, Strength 5/5 all 4 extremities, Sensation intact all 4 extremities, Plantars down going.  4. Ears and Eyes appear Normal, Conjunctivae clear, PERRLA. Moist Oral Mucosa.  5. Supple Neck, No JVD, No cervical lymphadenopathy appriciated, No Carotid Bruits.  6. Symmetrical Chest wall movement, Good air movement bilaterally, CTAB.  7. RRR, No Gallops, Rubs or Murmurs, No Parasternal Heave.  8. Positive Bowel Sounds, Abdomen Soft, Non tender, No organomegaly appriciated,No rebound -guarding or rigidity.  9.  No Cyanosis, Normal Skin Turgor, No Skin Rash or Bruise.  10. Good muscle tone,  joints appear normal , no effusions, Normal ROM.  11. No Palpable Lymph Nodes in Neck or  Axillae    Data Review  CBC  Recent Labs Lab 01/31/13 1840  WBC 11.3*  HGB 10.5*  HCT 30.5*  PLT 184  MCV 88.7  MCH 30.5  MCHC 34.4  RDW 18.4*  LYMPHSABS 1.3  MONOABS 0.5  EOSABS 0.1  BASOSABS 0.0   ------------------------------------------------------------------------------------------------------------------  Chemistries   Recent Labs Lab 01/31/13 1840  NA 138  K 2.5*  CL 87*  CO2 31  GLUCOSE 110*  BUN 43*  CREATININE 1.04  CALCIUM 5.2*  AST 61*  ALT 19  ALKPHOS 93  BILITOT 5.4*   ------------------------------------------------------------------------------------------------------------------  ---------------------------------------------------------------------------------------------------------------  Urinalysis    Component Value Date/Time   COLORURINE ORANGE* 01/31/2013 1904   APPEARANCEUR TURBID* 01/31/2013 1904   LABSPEC 1.016 01/31/2013 1904   PHURINE 7.5 01/31/2013 1904   GLUCOSEU NEGATIVE 01/31/2013 1904   HGBUR NEGATIVE 01/31/2013 1904   BILIRUBINUR LARGE* 01/31/2013 1904   KETONESUR NEGATIVE 01/31/2013 1904   PROTEINUR 30* 01/31/2013 1904   UROBILINOGEN >8.0* 01/31/2013 1904   NITRITE NEGATIVE 01/31/2013 1904   LEUKOCYTESUR LARGE* 01/31/2013 1904    ----------------------------------------------------------------------------------------------------------------    Imaging results:   Dg Chest Port 1 View  01/31/2013   *RADIOLOGY REPORT*  Clinical Data: Chest pain.  COPD.  Weakness.  Diabetes and hypertension.  CHEST - 1 VIEW  Comparison:  07/18/2012  Findings: The heart size and mediastinal contours are within normal limits.  Both lungs are clear.  IMPRESSION: No active disease.   Original Report Authenticated By: Myles Rosenthal, M.D.     Assessment & Plan  1. urinary tract infection; urine culture ordered; IV Rocephin started 2. Hypothyroidism; patient doesn't know does however she is on 100 mcg by mouth daily and she's not compliant with  the medications. We'll check her TSH and start 50 mcg daily for 3 days to be increased 3. Alcoholism; patient started on protocol with Ativan when necessary. Thiamine started 4. Hyperbilirubinemia; not sure of the cause alcohol versus gilbert's syndrome versus a primary liver problem. Abdominal ultrasound ordered; please consult GI in a.m. 5. Low potassium; this is chronic according to the daughter and patient is not taken her medications will replace 6. Low calcium; will replace check ionized calcium. Patient has low albumin 7. Malnutrition; might benefit from dietitian consult. 8. Generalized weakness, might benefit from physical therapy consult.   DVT Prophylaxis Lovenox  AM Labs Ordered, also please review Full Orders  Family Communication: Admission, patients condition and plan of care including tests being ordered have been discussed with the patient and daughter who indicate understanding and  agree with the plan and Code Status.  Code Status full  Disposition Plan: Nursing home facility versus home with home health  Time spent in minutes : 35 minutes  Condition GUARDED

## 2013-01-31 NOTE — ED Notes (Signed)
Daughter's phone number [phone] Alfredo Batty

## 2013-01-31 NOTE — ED Notes (Signed)
Pt from home. Has not eaten 2 weeks besides pedilyte, pt too weak to ambulate much today. Pt began to have emesis this am. Sent here for evaluation by daughter. Daughter reports that pt's urine was dark.

## 2013-01-31 NOTE — ED Notes (Signed)
[name]: Pt's daughter: [phone]

## 2013-01-31 NOTE — ED Provider Notes (Signed)
CSN: [id number]     Arrival date & time 01/31/13  1803 History     First MD Initiated Contact with Patient 01/31/13 1809     Chief Complaint  Patient presents with  . Fatigue  . Eating Disorder  . Emesis   (Consider location/radiation/quality/duration/timing/severity/associated sxs/prior Treatment) Patient is a 79 y.o. female presenting with vomiting. The history is provided by the patient (the pt complains of weakness and vomiting).  Emesis Severity:  Mild Timing:  Intermittent Quality:  Stomach contents Able to tolerate:  Liquids Progression:  Unchanged Chronicity:  New Context: not post-tussive   Relieved by:  Nothing Associated symptoms: no abdominal pain, no diarrhea and no headaches     Past Medical History  Diagnosis Date  . Abdominal pain   . Hypothyroid   . Diabetes mellitus   . COPD (chronic obstructive pulmonary disease)   . GERD (gastroesophageal reflux disease)   . Blood transfusion   . Hypertension   . Urinary tract infection   . Blood dyscrasia     HYPOKALEMIA   Past Surgical History  Procedure Laterality Date  . Abdominal hysterectomy    . Esophagogastroduodenoscopy  05/17/2011    Procedure: ESOPHAGOGASTRODUODENOSCOPY (EGD);  Surgeon: Hart Carwin, MD;  Location: [facility] ENDOSCOPY;  Service: Endoscopy;  Laterality: N/A;   Family History  Problem Relation Age of Onset  . Stroke Mother    History  Substance Use Topics  . Smoking status: Current Every Day Smoker -- 0.50 packs/day for 30 years    Types: Cigarettes  . Smokeless tobacco: Never Used  . Alcohol Use: Yes     Comment: Uknown How much, daughter thinks she drinks heavy   OB History   Grav Para Term Preterm Abortions TAB SAB Ect Mult Living                 Review of Systems  Constitutional: Negative for appetite change and fatigue.  HENT: Negative for congestion, sinus pressure and ear discharge.   Eyes: Negative for discharge.  Respiratory: Negative for cough.   Cardiovascular:  Negative for chest pain.  Gastrointestinal: Positive for vomiting. Negative for abdominal pain and diarrhea.  Genitourinary: Negative for frequency and hematuria.  Musculoskeletal: Negative for back pain.  Skin: Negative for rash.  Neurological: Negative for seizures and headaches.  Psychiatric/Behavioral: Negative for hallucinations.    Allergies  Review of patient's allergies indicates no known allergies.  Home Medications   Current Outpatient Rx  Name  Route  Sig  Dispense  Refill  . Esomeprazole Magnesium (NEXIUM PO)   Oral   Take 1 capsule by mouth daily.         Marland Kitchen LEVOTHYROXINE SODIUM PO   Oral   Take 1 tablet by mouth daily.          BP 113/81  Pulse 109  Temp(Src) 98.1 F (36.7 C) (Oral)  Resp 16  SpO2 97%  LMP 05/17/1983 Physical Exam  Constitutional: She is oriented to person, place, and time. She appears well-developed.  HENT:  Head: Normocephalic.  Eyes: EOM are normal. No scleral icterus.  icteris  Neck: Neck supple. No thyromegaly present.  Cardiovascular: Normal rate and regular rhythm.  Exam reveals no gallop and no friction rub.   No murmur heard. Pulmonary/Chest: No stridor. She has no wheezes. She has no rales. She exhibits no tenderness.  Abdominal: She exhibits no distension. There is no tenderness. There is no rebound.  Musculoskeletal: Normal range of motion. She exhibits no edema.  Lymphadenopathy:  She has no cervical adenopathy.  Neurological: She is oriented to person, place, and time. Coordination normal.  Skin: No rash noted. No erythema.  Psychiatric: She has a normal mood and affect. Her behavior is normal.    ED Course   Procedures (including critical care time)  Labs Reviewed  CBC WITH DIFFERENTIAL - Abnormal; Notable for the following:    WBC 11.3 (*)    RBC 3.44 (*)    Hemoglobin 10.5 (*)    HCT 30.5 (*)    RDW 18.4 (*)    Neutrophils Relative % 82 (*)    Neutro Abs 9.3 (*)    All other components within normal  limits  COMPREHENSIVE METABOLIC PANEL - Abnormal; Notable for the following:    Potassium 2.5 (*)    Chloride 87 (*)    Glucose, Bld 110 (*)    BUN 43 (*)    Calcium 5.2 (*)    Albumin 2.6 (*)    AST 61 (*)    Total Bilirubin 5.4 (*)    GFR calc non Af Amer 50 (*)    GFR calc Af Amer 59 (*)    All other components within normal limits  LIPASE, BLOOD - Abnormal; Notable for the following:    Lipase 7 (*)    All other components within normal limits  URINALYSIS, ROUTINE W REFLEX MICROSCOPIC - Abnormal; Notable for the following:    Color, Urine ORANGE (*)    APPearance TURBID (*)    Bilirubin Urine LARGE (*)    Protein, ur 30 (*)    Urobilinogen, UA >8.0 (*)    Leukocytes, UA LARGE (*)    All other components within normal limits  URINE MICROSCOPIC-ADD ON - Abnormal; Notable for the following:    Bacteria, UA MANY (*)    All other components within normal limits  URINE CULTURE   Dg Chest Port 1 View  01/31/2013   *RADIOLOGY REPORT*  Clinical Data: Chest pain.  COPD.  Weakness.  Diabetes and hypertension.  CHEST - 1 VIEW  Comparison:  07/18/2012  Findings: The heart size and mediastinal contours are within normal limits.  Both lungs are clear.  IMPRESSION: No active disease.   Original Report Authenticated By: Myles Rosenthal, M.D.   1. Hypokalemia   2. UTI (lower urinary tract infection)     MDM  Admit for hypokalemia and uit  Benny Lennert, MD 01/31/13 2027

## 2013-01-31 NOTE — ED Notes (Signed)
[id number]<AV> Expected date:<BR> Expected time:<BR> Means of arrival:<BR> Comments:<BR> 79yo-weakness/hasn&#39;t ate in 2wks

## 2013-01-31 NOTE — Progress Notes (Signed)
Patient does not want to sign up for My Chart. Briscoe Burns BSN, RN-BC Admissions RN  01/31/2013 8:36 PM

## 2013-01-31 NOTE — ED Notes (Signed)
Dr Estell Harpin notified of critical potassium and calcium

## 2013-02-01 ENCOUNTER — Inpatient Hospital Stay (HOSPITAL_COMMUNITY): Payer: Medicare Other

## 2013-02-01 ENCOUNTER — Encounter (HOSPITAL_COMMUNITY): Payer: Self-pay | Admitting: Unknown Physician Specialty

## 2013-02-01 DIAGNOSIS — N179 Acute kidney failure, unspecified: Secondary | ICD-10-CM

## 2013-02-01 DIAGNOSIS — J9600 Acute respiratory failure, unspecified whether with hypoxia or hypercapnia: Secondary | ICD-10-CM

## 2013-02-01 DIAGNOSIS — D72829 Elevated white blood cell count, unspecified: Secondary | ICD-10-CM | POA: Diagnosis present

## 2013-02-01 LAB — GLUCOSE, CAPILLARY: Glucose-Capillary: 191 mg/dL — ABNORMAL HIGH (ref 70–99)

## 2013-02-01 LAB — COMPREHENSIVE METABOLIC PANEL
AST: 51 U/L — ABNORMAL HIGH (ref 0–37)
Albumin: 2.2 g/dL — ABNORMAL LOW (ref 3.5–5.2)
Chloride: 97 mEq/L (ref 96–112)
Creatinine, Ser: 0.87 mg/dL (ref 0.50–1.10)
Potassium: 2.8 mEq/L — ABNORMAL LOW (ref 3.5–5.1)
Total Bilirubin: 4 mg/dL — ABNORMAL HIGH (ref 0.3–1.2)
Total Protein: 5.9 g/dL — ABNORMAL LOW (ref 6.0–8.3)

## 2013-02-01 LAB — MAGNESIUM: Magnesium: 0.8 mg/dL — CL (ref 1.5–2.5)

## 2013-02-01 LAB — STREP PNEUMONIAE URINARY ANTIGEN: Strep Pneumo Urinary Antigen: NEGATIVE

## 2013-02-01 LAB — TSH: TSH: 2.48 u[IU]/mL (ref 0.350–4.500)

## 2013-02-01 LAB — CALCIUM, IONIZED: Calcium, Ion: 0.49 mmol/L — CL (ref 1.13–1.30)

## 2013-02-01 MED ORDER — METHYLPREDNISOLONE SODIUM SUCC 40 MG IJ SOLR
40.0000 mg | Freq: Two times a day (BID) | INTRAMUSCULAR | Status: DC
  Administered 2013-02-01 (×2): 40 mg via INTRAVENOUS
  Filled 2013-02-01 (×4): qty 1

## 2013-02-01 MED ORDER — DEXTROSE 5 % IV SOLN
1.0000 g | INTRAVENOUS | Status: DC
  Administered 2013-02-01 – 2013-02-04 (×4): 1 g via INTRAVENOUS
  Filled 2013-02-01 (×5): qty 10

## 2013-02-01 MED ORDER — SODIUM CHLORIDE 0.9 % IV SOLN
2.0000 g | Freq: Once | INTRAVENOUS | Status: AC
Start: 2013-02-01 — End: 2013-02-01
  Administered 2013-02-01: 2 g via INTRAVENOUS
  Filled 2013-02-01: qty 20

## 2013-02-01 MED ORDER — DEXTROSE 5 % IV SOLN
500.0000 mg | INTRAVENOUS | Status: DC
  Administered 2013-02-01: 500 mg via INTRAVENOUS
  Filled 2013-02-01 (×2): qty 500

## 2013-02-01 MED ORDER — MAGNESIUM SULFATE 40 MG/ML IJ SOLN
4.0000 g | Freq: Once | INTRAMUSCULAR | Status: AC
  Administered 2013-02-01: 4 g via INTRAVENOUS
  Filled 2013-02-01: qty 100

## 2013-02-01 MED ORDER — INSULIN ASPART 100 UNIT/ML ~~LOC~~ SOLN
5.0000 [IU] | Freq: Once | SUBCUTANEOUS | Status: AC
  Administered 2013-02-01: 5 [IU] via SUBCUTANEOUS

## 2013-02-01 MED ORDER — BOOST / RESOURCE BREEZE PO LIQD
1.0000 | Freq: Every day | ORAL | Status: DC
  Administered 2013-02-01 – 2013-02-06 (×5): 1 via ORAL

## 2013-02-01 MED ORDER — SODIUM CHLORIDE 0.9 % IV SOLN
INTRAVENOUS | Status: DC
  Administered 2013-02-01: 23:00:00 via INTRAVENOUS

## 2013-02-01 MED ORDER — POTASSIUM CHLORIDE CRYS ER 20 MEQ PO TBCR
40.0000 meq | EXTENDED_RELEASE_TABLET | Freq: Three times a day (TID) | ORAL | Status: AC
  Administered 2013-02-01 (×3): 40 meq via ORAL
  Filled 2013-02-01 (×3): qty 2

## 2013-02-01 NOTE — Progress Notes (Signed)
CRITICAL VALUE ALERT  Critical value received:  Mg 0.8   Date of notification: 02/01/2013   Time of notification:  0728am  Critical value read back:Yes  Nurse who received alert:  A Yetta Flock RN  MD notified (1st page):  Izola Price   Time of first page:  0729 am  MD notified (2nd page):  Time of second page:  Responding MD: Izola Price  Time MD responded:  0810am

## 2013-02-01 NOTE — Progress Notes (Signed)
Clinical Social Work Department CLINICAL SOCIAL WORK PLACEMENT NOTE 02/01/2013  Patient:  [name], [name]  Account Number:  [id number] Admit date:  01/31/2013  Clinical Social Worker:  Orpah Greek  Date/time:  02/01/2013 04:34 PM  Clinical Social Work is seeking post-discharge placement for this patient at the following level of care:   SKILLED NURSING   (*CSW will update this form in Epic as items are completed)   02/01/2013  Patient/family provided with [facility] Department of Clinical Social Work's list of facilities offering this level of care within the geographic area requested by the patient (or if unable, by the patient's family).  02/01/2013  Patient/family informed of their freedom to choose among providers that offer the needed level of care, that participate in Medicare, Medicaid or managed care program needed by the patient, have an available bed and are willing to accept the patient.  02/01/2013  Patient/family informed of [facility]' ownership interest in [facility], as well as of the fact that they are under no obligation to receive care at this facility.  PASARR submitted to EDS on 02/01/2013 PASARR number received from EDS on 02/01/2013  FL2 transmitted to all facilities in geographic area requested by pt/family on  02/01/2013 FL2 transmitted to all facilities within larger geographic area on   Patient informed that his/her managed care company has contracts with or will negotiate with  certain facilities, including the following:     Patient/family informed of bed offers received:   Patient chooses bed at  Physician recommends and patient chooses bed at    Patient to be transferred to  on   Patient to be transferred to facility by   The following physician request were entered in Epic:   Additional Comments:   Unice Bailey, LCSW [facility] Clinical Social Worker cell #: [phone]

## 2013-02-01 NOTE — Progress Notes (Signed)
INITIAL NUTRITION ASSESSMENT  DOCUMENTATION CODES Per approved criteria  -Not Applicable   INTERVENTION: Diet advancement per MD discretion. Recommend Mechanical soft Provide Resource Breeze once daily Provide Magic Cup once daily Continue Multivitamin with minerals daily Encourage PO intake Recommend daily weights  NUTRITION DIAGNOSIS: Inadequate oral intake related to poor appetite as evidenced by 13% wt loss per pt's daughter's report.   Goal: Pt to meet >/= 90% of their estimated nutrition needs  Monitor:  PO intake Weight Labs  Reason for Assessment: Consult/ MST  79 y.o. female  Admitting Dx: Altered mental state  ASSESSMENT: 79 y.o. female, who lives alone and has been a resident of [facility] in the past, presenting today with 2 weeks history of worsening of well being. According to her daughter in the last 2 weeks she has been getting weaker with nausea vomiting and constipation. There is a history of alcoholism but not sure how much she drinks. The patient denies any pain anywhere on her body but she feels weak.  RD consulted to assess pt due to poor PO intake. Pt eating tray of clear liquids at time of visit; pt belching frequently during meal. Per chart, pt weighs 183 lbs but, pt obviously appears to weigh much less. Per pt's daughter in the room, pt has never weighed 183 lbs; she has been weighing between 105-108 lbs but, daughter thinks she weighs less than 100 lbs now. RD used bed scale to weigh pt, weight was 41.7 kg (91.7 lbs). Daughter reports that pt typically eats very little but, she has been eating even less the past 2 weeks with increased frequency of vomiting. Pt expresses desire to eat regular solid food and reports that Glucerna and Ensure give her diarrhea. Daughter reports that pt complains of chewing difficulty but, pt denies needing soft foods or chopped meats.  Height: Ht Readings from Last 1 Encounters:  01/31/13 4\' 9"  (1.448 m)     Weight: Wt Readings from Last 1 Encounters:  01/31/13 183 lb 13.8 oz (83.4 kg)  Wt Innacurate. Using bed scale 02/01/13 pt weighed 91 lbs 11 oz  Ideal Body Weight: 95 lbs  % Ideal Body Weight: 96%  Wt Readings from Last 10 Encounters:  01/31/13 183 lb 13.8 oz (83.4 kg)  07/19/12 101 lb 9.6 oz (46.085 kg)  02/11/12 101 lb 8 oz (46.04 kg)  01/04/12 80 lb 1.6 oz (36.333 kg)  06/04/11 113 lb 14.4 oz (51.665 kg)  06/04/11 113 lb 14.4 oz (51.665 kg)    Usual Body Weight: 108 lbs  % Usual Body Weight: 85-87%  BMI:  Body mass index is 19.84 kg/(m^2).  Estimated Nutritional Needs: Kcal: 1300-1450 Protein: 62-70 grams Fluid: >/= 1.3 L  Skin: intact  Diet Order: Full Liquid  EDUCATION NEEDS: -No education needs identified at this time   Intake/Output Summary (Last 24 hours) at 02/01/13 1134 Last data filed at 02/01/13 0627  Gross per 24 hour  Intake 599.25 ml  Output    100 ml  Net 499.25 ml    Last BM: PTA  Labs:   Recent Labs Lab 01/31/13 1840 01/31/13 2226 02/01/13 0505  NA 138  --  140  K 2.5*  --  2.8*  CL 87*  --  97  CO2 31  --  25  BUN 43*  --  34*  CREATININE 1.04 0.92 0.87  CALCIUM 5.2*  --  5.5*  MG  --   --  0.8*  GLUCOSE 110*  --  173*    CBG (last 3)   Recent Labs  01/31/13 2238 02/01/13 0819  GLUCAP 81 191*    Scheduled Meds: . albuterol  2.5 mg Nebulization Q6H  . docusate sodium  100 mg Oral BID  . enoxaparin (LOVENOX) injection  40 mg Subcutaneous Q24H  . folic acid  1 mg Oral Daily  . insulin aspart  0-9 Units Subcutaneous TID WC  . ipratropium  0.5 mg Nebulization Q6H  . levothyroxine  50 mcg Oral QAC breakfast  . LORazepam  0-4 mg Intravenous Q6H   Followed by  . [START ON 02/02/2013] LORazepam  0-4 mg Intravenous Q12H  . magnesium sulfate 1 - 4 g bolus IVPB  4 g Intravenous Once  . multivitamin with minerals  1 tablet Oral Daily  . pantoprazole  40 mg Oral Daily  . potassium chloride SA  40 mEq Oral TID  . sodium  chloride  3 mL Intravenous Q12H    Continuous Infusions: . dextrose 5 % and 0.45 % NaCl with KCl 40 mEq/L 75 mL/hr at 02/01/13 0001    Past Medical History  Diagnosis Date  . Abdominal pain   . Hypothyroid   . Diabetes mellitus   . COPD (chronic obstructive pulmonary disease)   . GERD (gastroesophageal reflux disease)   . Blood transfusion   . Hypertension   . Urinary tract infection   . Blood dyscrasia     HYPOKALEMIA    Past Surgical History  Procedure Laterality Date  . Abdominal hysterectomy    . Esophagogastroduodenoscopy  05/17/2011    Procedure: ESOPHAGOGASTRODUODENOSCOPY (EGD);  Surgeon: Hart Carwin, MD;  Location: [facility] ENDOSCOPY;  Service: Endoscopy;  Laterality: N/A;    Ian Malkin RD, LDN Inpatient Clinical Dietitian Pager: [phone] After Hours Pager: [phone]

## 2013-02-01 NOTE — Progress Notes (Signed)
Clinical Social Work Department BRIEF PSYCHOSOCIAL ASSESSMENT 02/01/2013  Patient:  [name], [name]     Account Number:  [id number]     Admit date:  01/31/2013  Clinical Social Worker:  Orpah Greek  Date/Time:  02/01/2013 04:17 PM  Referred by:  Physician  Date Referred:  02/01/2013 Referred for  SNF Placement   Other Referral:   Interview type:  Patient Other interview type:    PSYCHOSOCIAL DATA Living Status:  ALONE Admitted from facility:   Level of care:   Primary support name:  Alfredo Batty (daughter) ph#: [phone] Primary support relationship to patient:  CHILD, ADULT Degree of support available:   good    CURRENT CONCERNS Current Concerns  Post-Acute Placement   Other Concerns:    SOCIAL WORK ASSESSMENT / PLAN CSW reviewed PT evaluation recommending SNF for patient at discharge.   Assessment/plan status:  Information/Referral to [facility] Other assessment/ plan:   Information/referral to community resources:   CSW completed FL2 and faxed information out to [facility] - provided list of facilties to patient, awaiting bed offers.    PATIENT'S/FAMILY'S RESPONSE TO PLAN OF CARE: Patient states that she does live alone and would prefer to return home. Patient though is agreeable with having information faxed out to facilities as a backup - requested to wait and see how she does with therapy in the next day or so.    CSW to check back & provide bed offers.       Unice Bailey, LCSW [facility] Clinical Social Worker cell #: [phone]

## 2013-02-01 NOTE — Progress Notes (Signed)
CRITICAL VALUE ALERT  Critical value received:  Calcium 5.5  Date of notification:  02/01/13  Time of notification:  0640  Critical value read back:yes  Nurse who received alert:  Sherlon Handing  MD notified (1st page): K.Kriby  Time of first BJYN:[id number]  MD notified (2nd page):  Time of second page:  Responding MD:    Time MD responded:

## 2013-02-01 NOTE — Progress Notes (Signed)
Patient ID: [name], female   DOB: January 31, 1934, 79 y.o.   MRN: [id number] [facility] PROGRESS NOTE  [name] [id number] DOB: 11/20/1933 DOA: 01/31/2013 PCP: Ginette Otto, MD  Brief narrative: Pt is 79 y.o. female, who lives alone and has been a resident of [facility] in the past, history of alcohol use, diabetes, hypothyroidism, who presented to [facility] ED with main concern of progressively worsening generalized weakness, intermittent episodes of confusion, 2 days of nausea and non bloody vomiting, poor oral intake. Please note that no family was available with pt on admission and pt was not able to provide history. Per daughter, pt has long history of alcohol abuse but is not clear how much adn how often pt drinks. On admission, pt denied any specific symptoms and did not know why she was in the hospital, she has denied chest pain or shortness of breath.     Principal Problem:   Altered mental state - this is likely multifactorial and secondary to progressive failure to thrive and malnutrition, ? Alcohol abuse, dehydration, UTI - pt is clinically improving and appears to be near her baseline mental status, I spoke with her daughter over the phone and she confirmed it - will continue to provide supportive care, antibiotics for presumptive UTI and ? COPD flare - plan on ordering PT evaluation  Active Problems:   Hypokalemia - secondary to poor oral intake, ? Alcohol use and malnutrition and low magnesium - will supplement again today via PO as pt is now alert and able to follow commands  - will supplement Mg with 4 gm IV  - repeat CMP and Mg blood work in AM   UTI (lower urinary tract infection) - pt did report suprapubic pain this AM, 5/10 in severity - will continue empiric Rocephin day #2 - follow up on urine cultures   Alcohol abuse  - per daughter but pt denies - will keep on CIWA protocol    Anemia of chronic disease - possibly from chronic  alcohol bone marrow toxicity - Hg and Hct are at baseline, no signs of active bleed    Transaminitis with elevated bilirubin - AST/ALT in the pattern of alcohol abuse - abdominal US with biliary sludge, fatty liver and small ascites - CMT in AM - continue on CIWA protocol    COPD - no clear infectious pulmonary etiology but given course breath sounds with productive cough and leukocytosis will start also on Zithromax - sputum analysis ordered - continue bronchodilators scheduled and as needed, added solumedrol today due to significant wheezing   Hypocalcemia - will continue to supplement with calcium gluconate   DM (diabetes mellitus) - pt reports not taking any medications for it, will check A1C - keep on SSI sensitive coverage for now   Leukocytosis, unspecified - possibly related to UTI, COPD - ABX Zithromax and Rocephin as noted above - CBC in AM   Hypothyroid - TSH is within normal limits  - continue Synthroid    Malnutrition - severe secondary to alcohol abuse  - will ask for nutrition consultation  Consultants:  None  Procedures/Studies: Dg Chest Port 1 View   01/31/2013   No active disease.    Antibiotics:  Rocephin 8/12 -->  Zithromax 8/13 -->   Code Status: Full Family Communication: Pt at bedside, daughter over the phone  Disposition Plan: Will need PT evaluation to determine the most suitable discharge plan   HPI/Subjective: No events overnight.   Objective: Filed Vitals:  01/31/13 1939 01/31/13 2045 01/31/13 2135 02/01/13 0439  BP:   106/84 109/68  Pulse:  88 87 112  Temp:   97.6 F (36.4 C) 98.4 F (36.9 C)  TempSrc:   Oral Oral  Resp:  15 18 16   Height:   4\' 9"  (1.448 m)   Weight:   83.4 kg (183 lb 13.8 oz)   SpO2: 97% 100% 100% 100%    Intake/Output Summary (Last 24 hours) at 02/01/13 0636 Last data filed at 02/01/13 8119  Gross per 24 hour  Intake 599.25 ml  Output    100 ml  Net 499.25 ml    Exam:   General:  Pt is alert,  follows commands appropriately, not in acute distress  Cardiovascular: Regular rhythm, tachycardic, S1/S2, no murmurs, no rubs, no gallops  Respiratory: Course breath sounds bilaterally with inspiratory and expiratory wheezing and scattered rhonchi   Abdomen: Soft, non tender, non distended, bowel sounds present, no guarding  Extremities: No edema, pulses DP and PT palpable bilaterally  Neuro: Grossly nonfocal  Data Reviewed: Basic Metabolic Panel:  Recent Labs Lab 01/31/13 1840 01/31/13 2226  NA 138  --   K 2.5*  --   CL 87*  --   CO2 31  --   GLUCOSE 110*  --   BUN 43*  --   CREATININE 1.04 0.92  CALCIUM 5.2*  --    Liver Function Tests:  Recent Labs Lab 01/31/13 1840  AST 61*  ALT 19  ALKPHOS 93  BILITOT 5.4*  PROT 6.9  ALBUMIN 2.6*    Recent Labs Lab 01/31/13 1840  LIPASE 7*   CBC:  Recent Labs Lab 01/31/13 1840 01/31/13 2226  WBC 11.3* 12.7*  NEUTROABS 9.3*  --   HGB 10.5* 10.4*  HCT 30.5* 29.8*  MCV 88.7 88.2  PLT 184 171   CBG:  Recent Labs Lab 01/31/13 2238  GLUCAP 81   Scheduled Meds: . albuterol  2.5 mg Nebulization Q6H  . docusate sodium  100 mg Oral BID  . enoxaparin (LOVENOX) injection  40 mg Subcutaneous Q24H  . folic acid  1 mg Oral Daily  . insulin aspart  0-9 Units Subcutaneous TID WC  . ipratropium  0.5 mg Nebulization Q6H  . levothyroxine  50 mcg Oral QAC breakfast  . LORazepam  0-4 mg Intravenous Q6H   Followed by  . [START ON 02/02/2013] LORazepam  0-4 mg Intravenous Q12H  . multivitamin with minerals  1 tablet Oral Daily  . pantoprazole  40 mg Oral Daily  . sodium chloride  3 mL Intravenous Q12H   Continuous Infusions: . dextrose 5 % and 0.45 % NaCl with KCl 40 mEq/L 75 mL/hr at 02/01/13 0001   Debbora Presto, MD  [facility] Pager [phone]  If 7PM-7AM, please contact night-coverage [email or web address] Password [facility] 02/01/2013, 6:36 AM   LOS: 1 day

## 2013-02-01 NOTE — Evaluation (Signed)
Physical Therapy Evaluation Patient Details Name: [name] MRN: [id number] DOB: July 12, 1933 Today's Date: 02/01/2013 Time: 8469-6295 PT Time Calculation (min): 23 min  PT Assessment / Plan / Recommendation History of Present Illness  79 y.o. female, who lives alone and has been a [facility] of [facility] past, presenting today with 2 weeks history of worsening of well being. According to her daughter in the last 2 weeks she has been getting weaker with nausea vomiting and constipation. There is a history of alcoholism but not sure how much she drinks. The patient denies any pain anywhere on her body but she feels weak. No cough fever or chills reported  Clinical Impression  On eval, pt required Mod-Max assist for mobility-pt only able to tolerate sitting EOB for a short while before fatiguing. Unable to attempt standing/ambulation due to weakness (safety issue with only 1 person assisting). Pt lives alone. Recommend SNF for ST rehab.     PT Assessment  Patient needs continued PT services    Follow Up Recommendations  SNF    Does the patient have the potential to tolerate intense rehabilitation      Barriers to Discharge        Equipment Recommendations  None recommended by PT    Recommendations for Other Services OT consult   Frequency Min 3X/week    Precautions / Restrictions Precautions Precautions: Fall Restrictions Weight Bearing Restrictions: No   Pertinent Vitals/Pain Neck with activity-unrated. Denies pain at rest.       Mobility  Bed Mobility Bed Mobility: Supine to Sit;Sit to Supine Supine to Sit: 3: Mod assist Sit to Supine: 2: Max assist Details for Bed Mobility Assistance: Assist for trunk to upright/supine and bil LEs off/onto bed. Increased time allowed for pt to perform unassisted but pt was unable. Transfers Transfers: Not assessed Details for Transfer Assistance: Pt too weak Ambulation/Gait Ambulation/Gait Assistance: Not  tested (comment) Ambulation/Gait Assistance Details: Pt too weak    Exercises     PT Diagnosis: Difficulty walking;Generalized weakness  PT Problem List: Decreased strength;Decreased activity tolerance;Decreased balance;Decreased mobility;Pain;Decreased knowledge of use of DME PT Treatment Interventions: DME instruction;Gait training;Functional mobility training;Therapeutic activities;Therapeutic exercise;Patient/family education;Balance training     PT Goals(Current goals can be found in the care plan section) Acute Rehab PT Goals Patient Stated Goal: rest.  PT Goal Formulation: With patient/family Time For Goal Achievement: 02/15/13 Potential to Achieve Goals: Fair  Visit Information  Last PT Received On: 02/01/13 Assistance Needed: +2 History of Present Illness: 79 y.o. female, who lives alone and has been a resident of [facility] in the past, presenting today with 2 weeks history of worsening of well being. According to her daughter in the last 2 weeks she has been getting weaker with nausea vomiting and constipation. There is a history of alcoholism but not sure how much she drinks. The patient denies any pain anywhere on her body but she feels weak. No cough fever or chills reported       Prior Functioning  Home Living Family/patient expects to be discharged to:: Private residence Living Arrangements: Alone Available Help at Discharge: Family;Friend(s);Available PRN/intermittently Type of Home: House Home Access: Stairs to enter Entrance Stairs-Number of Steps: 1 Entrance Stairs-Rails: None Home Equipment: Walker - 4 wheels;Walker - 2 wheels;Bedside commode;Grab bars - tub/shower;Grab bars - toilet Prior Function Level of Independence: Independent with assistive device(s) Communication Communication: No difficulties Dominant Hand: Right    Cognition  Cognition Arousal/Alertness: Awake/alert Behavior During Therapy: [facility]  for tasks assessed/performed Overall  Cognitive Status: Within Functional Limits for tasks assessed    Extremity/Trunk Assessment Upper Extremity Assessment Upper Extremity Assessment: Generalized weakness Lower Extremity Assessment Lower Extremity Assessment: Generalized weakness   Balance Balance Balance Assessed: Yes Static Sitting Balance Static Sitting - Balance Support: Bilateral upper extremity supported;Feet supported Static Sitting - Level of Assistance: 4: Min assist Static Sitting - Comment/# of Minutes: Sat EOB ~5-7 minutes, LOB x 3 to R side due to fatigue/weakness. Min assist to correct LOB and reposition  End of Session PT - End of Session Activity Tolerance: Patient limited by fatigue Patient left: in bed;with call bell/phone within reach;with family/visitor present Nurse Communication: Mobility status;Other (comment) (bed alarm unable to be reset)  GP     Rebeca Alert, MPT Pager: [phone]

## 2013-02-02 DIAGNOSIS — F10229 Alcohol dependence with intoxication, unspecified: Secondary | ICD-10-CM

## 2013-02-02 DIAGNOSIS — D649 Anemia, unspecified: Secondary | ICD-10-CM

## 2013-02-02 LAB — COMPREHENSIVE METABOLIC PANEL
ALT: 19 U/L (ref 0–35)
AST: 49 U/L — ABNORMAL HIGH (ref 0–37)
Albumin: 2.2 g/dL — ABNORMAL LOW (ref 3.5–5.2)
Alkaline Phosphatase: 73 U/L (ref 39–117)
Glucose, Bld: 250 mg/dL — ABNORMAL HIGH (ref 70–99)
Potassium: 4.7 mEq/L (ref 3.5–5.1)
Sodium: 137 mEq/L (ref 135–145)
Total Protein: 6.2 g/dL (ref 6.0–8.3)

## 2013-02-02 LAB — HEMOGLOBIN A1C: Mean Plasma Glucose: 97 mg/dL (ref <117)

## 2013-02-02 LAB — LEGIONELLA ANTIGEN, URINE

## 2013-02-02 LAB — GLUCOSE, CAPILLARY
Glucose-Capillary: 235 mg/dL — ABNORMAL HIGH (ref 70–99)
Glucose-Capillary: 194 mg/dL — ABNORMAL HIGH (ref 70–99)
Glucose-Capillary: 143 mg/dL — ABNORMAL HIGH (ref 70–99)

## 2013-02-02 LAB — MAGNESIUM: Magnesium: 1.6 mg/dL (ref 1.5–2.5)

## 2013-02-02 MED ORDER — DRONABINOL 2.5 MG PO CAPS
2.5000 mg | ORAL_CAPSULE | Freq: Every day | ORAL | Status: DC
  Administered 2013-02-02 – 2013-02-06 (×4): 2.5 mg via ORAL
  Filled 2013-02-02 (×4): qty 1

## 2013-02-02 MED ORDER — POLYETHYLENE GLYCOL 3350 17 G PO PACK
17.0000 g | PACK | Freq: Every day | ORAL | Status: DC | PRN
  Administered 2013-02-05: 17 g via ORAL
  Filled 2013-02-02: qty 1

## 2013-02-02 MED ORDER — CALCIUM CARBONATE ANTACID 500 MG PO CHEW
2.0000 | CHEWABLE_TABLET | Freq: Two times a day (BID) | ORAL | Status: DC
  Administered 2013-02-02 – 2013-02-06 (×7): 400 mg via ORAL
  Filled 2013-02-02 (×10): qty 2

## 2013-02-02 MED ORDER — IPRATROPIUM BROMIDE 0.02 % IN SOLN
0.5000 mg | Freq: Four times a day (QID) | RESPIRATORY_TRACT | Status: DC
  Administered 2013-02-03 – 2013-02-05 (×12): 0.5 mg via RESPIRATORY_TRACT
  Filled 2013-02-02 (×12): qty 2.5

## 2013-02-02 MED ORDER — ALBUTEROL SULFATE (5 MG/ML) 0.5% IN NEBU
2.5000 mg | INHALATION_SOLUTION | Freq: Four times a day (QID) | RESPIRATORY_TRACT | Status: DC
  Administered 2013-02-03 – 2013-02-04 (×5): 2.5 mg via RESPIRATORY_TRACT
  Filled 2013-02-02 (×5): qty 0.5

## 2013-02-02 MED ORDER — SODIUM CHLORIDE 0.9 % IV SOLN
INTRAVENOUS | Status: DC
  Administered 2013-02-02 – 2013-02-04 (×4): via INTRAVENOUS

## 2013-02-02 NOTE — Progress Notes (Addendum)
Patient ID: [name], female   DOB: December 06, 1933, 79 y.o.   MRN: [id number] [facility] PROGRESS NOTE  [name] WUJ:[id number] DOB: Feb 22, 1934 DOA: 01/31/2013 PCP: Ginette Otto, MD  Brief narrative: Pt is 79 y.o. female, who lives alone at Home, history of chronic alcoholism, diabetes, hypothyroidism, who presented to [facility] ED with main concern of progressively worsening generalized weakness, intermittent episodes of confusion, 2 days of nausea and non bloody vomiting, poor oral intake. Please note that no family was available with pt on admission and pt was not able to provide history. Per daughter, pt has long history of alcohol abuse but is not clear how much adn how often pt drinks.   Principal Problem:   Metabolic encephalopathy  - this is likely multifactorial and secondary to chronic alcoholism,  ETOH induced dementia,  dehydration, UTI - mentation improved yesterday but again with agitation overnight requiring sedation with ativan -drowsy this am after ativan administration, will DC CIWA    Hypokalemia - secondary to poor oral intake, ? 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    Antibiotics:  Rocephin 8/12 -->  Zithromax 8/13 -->   Code Status: Full Family Communication: none at bedside, called and left msg for daughter Disposition Plan: needs SNF at discharge although pt is declining this  HPI/Subjective: No events overnight, no N/V, abd pain, agitated overnight and given ativan, very poor PO intake   Objective: Filed Vitals:   02/01/13 1953 02/01/13 2059 02/02/13 0550 02/02/13 1355  BP:  109/74 120/81   Pulse:  106 107   Temp:  98.1 F (36.7 C) 98.3 F (36.8 C)   TempSrc:  Oral Oral   Resp:  18 18   Height:      Weight:      SpO2: 100% 100% 100% 92%    Intake/Output Summary (Last 24 hours) at 02/02/13 1504 Last data filed at 02/02/13 0556  Gross per 24 hour  Intake    855 ml  Output    600 ml  Net    255 ml    Exam:   General:  Pt is alert, awake, oriented x3, not in acute distress  Cardiovascular: Regular rhythm, tachycardic, S1/S2, no murmurs, no rubs, no gallops  Respiratory: poor air movement B/L, no distress  Abdomen: Soft, non tender,  non distended, bowel sounds present, no guarding  Extremities: No edema, pulses DP and PT palpable bilaterally  Neuro: Grossly nonfocal  Data Reviewed: Basic Metabolic Panel:  Recent Labs Lab 01/31/13 1840 01/31/13 2226 02/01/13 0505 02/02/13 0547  NA 138  --  140 137  K 2.5*  --  2.8* 4.7  CL 87*  --  97 101  CO2 31  --  25 19  GLUCOSE 110*  --  173* 250*  BUN 43*  --  34* 21  CREATININE 1.04 0.92 0.87 0.74  CALCIUM 5.2*  --  5.5* 6.3*  MG  --   --  0.8* 1.6   Liver Function Tests:  Recent  Labs Lab 01/31/13 1840 02/01/13 0505 02/02/13 0547  AST 61* 51* 49*  ALT 19 16 19   ALKPHOS 93 58 73  BILITOT 5.4* 4.0* 3.2*  PROT 6.9 5.9* 6.2  ALBUMIN 2.6* 2.2* 2.2*    Recent Labs Lab 01/31/13 1840  LIPASE 7*   CBC:  Recent Labs Lab 01/31/13 1840 01/31/13 2226  WBC 11.3* 12.7*  NEUTROABS 9.3*  --   HGB 10.5* 10.4*  HCT 30.5* 29.8*  MCV 88.7 88.2  PLT 184 171   CBG:  Recent Labs Lab 02/01/13 1157 02/01/13 1702 02/01/13 2102 02/02/13 0758 02/02/13 1235  GLUCAP 216* 362* 302* 235* 194*   Scheduled Meds: . albuterol  2.5 mg Nebulization Q6H  . calcium carbonate  2 tablet Oral BID WC  . cefTRIAXone (ROCEPHIN) 1 g IVPB  1 g Intravenous Q24H  . docusate sodium  100 mg Oral BID  . dronabinol  2.5 mg Oral QAC lunch  . enoxaparin (LOVENOX) injection  40 mg Subcutaneous Q24H  . feeding supplement  1 Container Oral Q1200  . folic acid  1 mg Oral Daily  . insulin aspart  0-9 Units Subcutaneous TID WC  . ipratropium  0.5 mg Nebulization Q6H  . levothyroxine  50 mcg Oral QAC breakfast  . LORazepam  0-4 mg Intravenous Q6H   Followed by  . LORazepam  0-4 mg Intravenous Q12H  . multivitamin with minerals  1 tablet Oral Daily  . pantoprazole  40 mg Oral Daily  . sodium chloride  3 mL Intravenous Q12H   Continuous Infusions: . sodium chloride 75 mL/hr at 02/02/13 1134   Tammy Schumpert, MD  [facility] Pager [phone]  If 7PM-7AM, please contact night-coverage [email or web address] Password [email or web address] 02/02/2013, 3:04 PM   LOS: 2 days

## 2013-02-02 NOTE — Progress Notes (Signed)
Respiratory protocol done. Patient was changed to QID instead of Q6. Patient had BBS of Expiratory wheezes in her upper airways with clear in her bases . There Wasn't a history of COPD, Asthma or any other respiratory issue I could find. Albuterol had no effect on patient wheezes better or worse. May want to try looking for another reason for wheezing. Will be reevaluated in 3 days per protocol.

## 2013-02-02 NOTE — Progress Notes (Signed)
CSW spoke with patient to provide SNF bed offers - patient declined bed offers, adamant about returning home. Patient states that she has many friends and neighbors that will take care of her and take her to "wherever she needs to go". RNCM, Cookie made aware.   Unice Bailey, LCSW [facility] Clinical Social Worker cell #: [phone]

## 2013-02-02 NOTE — Progress Notes (Signed)
Critical Calcium of 6.3 received, improvement from prior day.

## 2013-02-03 ENCOUNTER — Encounter (HOSPITAL_COMMUNITY): Payer: Self-pay | Admitting: *Deleted

## 2013-02-03 DIAGNOSIS — G9341 Metabolic encephalopathy: Secondary | ICD-10-CM | POA: Diagnosis present

## 2013-02-03 DIAGNOSIS — N390 Urinary tract infection, site not specified: Principal | ICD-10-CM

## 2013-02-03 LAB — CBC
MCHC: 33.7 g/dL (ref 30.0–36.0)
Platelets: 213 10*3/uL (ref 150–400)
RDW: 19.5 % — ABNORMAL HIGH (ref 11.5–15.5)

## 2013-02-03 LAB — COMPREHENSIVE METABOLIC PANEL
ALT: 27 U/L (ref 0–35)
Albumin: 2.5 g/dL — ABNORMAL LOW (ref 3.5–5.2)
Alkaline Phosphatase: 89 U/L (ref 39–117)
Potassium: 5.7 mEq/L — ABNORMAL HIGH (ref 3.5–5.1)
Sodium: 140 mEq/L (ref 135–145)
Total Protein: 7.1 g/dL (ref 6.0–8.3)

## 2013-02-03 LAB — BASIC METABOLIC PANEL
Calcium: 6.2 mg/dL — CL (ref 8.4–10.5)
Creatinine, Ser: 0.69 mg/dL (ref 0.50–1.10)
GFR calc non Af Amer: 82 mL/min — ABNORMAL LOW (ref >90)
Glucose, Bld: 82 mg/dL (ref 70–99)
Sodium: 140 mEq/L (ref 135–145)

## 2013-02-03 LAB — URINE CULTURE: Colony Count: >=100000

## 2013-02-03 LAB — GLUCOSE, CAPILLARY
Glucose-Capillary: 57 mg/dL — ABNORMAL LOW (ref 70–99)
Glucose-Capillary: 66 mg/dL — ABNORMAL LOW (ref 70–99)
Glucose-Capillary: 107 mg/dL — ABNORMAL HIGH (ref 70–99)
Glucose-Capillary: 66 mg/dL — ABNORMAL LOW (ref 70–99)
Glucose-Capillary: 197 mg/dL — ABNORMAL HIGH (ref 70–99)

## 2013-02-03 MED ORDER — DEXTROSE 50 % IV SOLN
25.0000 mL | Freq: Once | INTRAVENOUS | Status: AC | PRN
  Administered 2013-02-03: 25 mL via INTRAVENOUS
  Filled 2013-02-03 (×2): qty 50

## 2013-02-03 MED ORDER — SODIUM POLYSTYRENE SULFONATE 15 GM/60ML PO SUSP
15.0000 g | Freq: Once | ORAL | Status: DC
  Filled 2013-02-03: qty 60

## 2013-02-03 MED ORDER — SODIUM POLYSTYRENE SULFONATE 15 GM/60ML PO SUSP
15.0000 g | Freq: Once | ORAL | Status: AC
  Filled 2013-02-03 (×2): qty 60

## 2013-02-03 MED ORDER — ENSURE PUDDING PO PUDG
1.0000 | Freq: Three times a day (TID) | ORAL | Status: DC
  Administered 2013-02-03 – 2013-02-06 (×6): 1 via ORAL
  Filled 2013-02-03 (×11): qty 1

## 2013-02-03 MED ORDER — SODIUM POLYSTYRENE SULFONATE 15 GM/60ML PO SUSP: 15.0000 g | Freq: Once | ORAL | Status: DC

## 2013-02-03 MED ORDER — SODIUM POLYSTYRENE SULFONATE 15 GM/60ML PO SUSP
15.0000 g | Freq: Once | ORAL | Status: AC
  Administered 2013-02-03: 15 g via RECTAL
  Filled 2013-02-03: qty 60

## 2013-02-03 MED ORDER — DEXTROSE 50 % IV SOLN
INTRAVENOUS | Status: AC
  Administered 2013-02-03: 50 mL
  Filled 2013-02-03: qty 50

## 2013-02-03 NOTE — Progress Notes (Signed)
Pt continued to refused oral intakes including meds. MD and daughter are aware. Blood sugar was 66 this evening again, MD was notified. Appropriate intervention was administered will continue to monitor pt.

## 2013-02-03 NOTE — Progress Notes (Signed)
Physical Therapy Treatment Patient Details Name: CYLINDA SANTOLI MRN: [id number] DOB: 05-30-1934 Today's Date: 02/03/2013 Time: 0920-0933 PT Time Calculation (min): 13 min  PT Assessment / Plan / Recommendation  History of Present Illness 79 y.o. female, who lives alone and has been a resident of [facility] in the past, presenting today with 2 weeks history of worsening of well being. According to her daughter in the last 2 weeks she has been getting weaker with nausea vomiting and constipation. There is a history of alcoholism but not sure how much she drinks. The patient denies any pain anywhere on her body but she feels weak. No cough fever or chills reported   PT Comments   Pt required increased assistance this session. Unable to sit EOB unassisted, for any amount of time. Pt's speech also very garbled. RN made aware.   Follow Up Recommendations  SNF     Does the patient have the potential to tolerate intense rehabilitation     Barriers to Discharge        Equipment Recommendations  None recommended by PT    Recommendations for Other Services OT consult  Frequency Min 3X/week   Progress towards PT Goals Progress towards PT goals: Not progressing toward goals - comment (unable to sit EOB)  Plan Current plan remains appropriate    Precautions / Restrictions Precautions Precautions: Fall Restrictions Weight Bearing Restrictions: No   Pertinent Vitals/Pain Chronic neck pain    Mobility  Bed Mobility Bed Mobility: Supine to Sit;Sit to Supine Supine to Sit: 1: +2 Total assist;HOB elevated;With rails Supine to Sit: Patient Percentage: 20% Sit to Supine: 1: +2 Total assist;HOB elevated;With rail Sit to Supine: Patient Percentage: 20% Details for Bed Mobility Assistance: Assist for trunk to upright/supine and bil LEs off/onto bed. Increased time allowed for pt to perform unassisted but pt was unable. Transfers Details for Transfer Assistance: unable to attempt-pt  too weak    Exercises     PT Diagnosis:    PT Problem List:   PT Treatment Interventions:     PT Goals (current goals can now be found in the care plan section)    Visit Information  Last PT Received On: 02/03/13 Assistance Needed: +2 History of Present Illness: 79 y.o. female, who lives alone and has been a resident of [facility] in the past, presenting today with 2 weeks history of worsening of well being. According to her daughter in the last 2 weeks she has been getting weaker with nausea vomiting and constipation. There is a history of alcoholism but not sure how much she drinks. The patient denies any pain anywhere on her body but she feels weak. No cough fever or chills reported    Subjective Data      Cognition  Cognition Arousal/Alertness: Awake/alert Behavior During Therapy: Anxious Overall Cognitive Status: Difficult to assess Difficult to assess due to: Impaired communication    Balance  Balance Balance Assessed: Yes Static Sitting Balance Static Sitting - Balance Support: Feet supported;Bilateral upper extremity supported Static Sitting - Level of Assistance: 1: +1 Total assist Static Sitting - Comment/# of Minutes: Pt unable to sit EOB unassisted today. Little to no trunk control with pt losing balance in all directions.   End of Session PT - End of Session Activity Tolerance: Patient limited by lethargy;Patient limited by fatigue Patient left: in bed (with all rails up) Nurse Communication: Mobility status;Other (comment) (bed alarm unable to be reset)   GP  Weston Anna, MPT Pager: [phone]

## 2013-02-03 NOTE — Progress Notes (Signed)
Hypoglycemic Event  CBG:66  Treatment: D50 IV 25 mL  Symptoms: None  Follow-up CBG: Time:0922 CBG Result:200  Possible Reasons for Event: Inadequate meal intake  Comments/MD notified:P. Jomarie Longs, MD    Aggie Hacker A  Remember to initiate Hypoglycemia Order Set & complete

## 2013-02-03 NOTE — Progress Notes (Signed)
CSW spoke with patient's daughter, Alfredo Batty (cell#: [phone]) re: discharge planning. Daughter is on her way now from [place] to speak with MD. Daughter requested that CSW check in with [facility] for bed availability, as patient has been there in the past. CSW faxed information over (ph#: [phone] fax#: [phone])  & confirmed with Alcario Drought that they received it - awaiting bed offer.   Unice Bailey, LCSW [facility] Clinical Social Worker cell #: [phone]

## 2013-02-03 NOTE — Progress Notes (Signed)
Patient ID: [name], female   DOB: 10-17-1933, 79 y.o.   MRN: [id number] [facility] PROGRESS NOTE  [name]:[id number] DOB: 04/11/1934 DOA: 01/31/2013 PCP: Ginette Otto, MD  Brief narrative: Pt is 79 y.o. female, who lives alone at Home, history of chronic alcoholism, diabetes, hypothyroidism, who presented to [facility] ED with main concern of progressively worsening generalized weakness, intermittent episodes of confusion, 2 days of nausea and non bloody vomiting, poor oral intake. Please note that no family was available with pt on admission and pt was not able to provide history. Per daughter, pt has long history of alcohol abuse but is not clear how much adn how often pt drinks.   Principal Problem:   Metabolic encephalopathy  - this is likely multifactorial and secondary to chronic alcoholism,  ETOH induced dementia,  dehydration, UTI - mentation improved yesterday but again with agitation overnight requiring sedation with ativan -drowsy this am after ativan administration, will DC CIWA    Hypokalemia - secondary to poor oral intake, ? 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    Antibiotics:  Rocephin 8/12 -->  Zithromax 8/13 -->   Code Status: Full Family Communication: none at bedside, called and left msg for daughter Disposition Plan: needs SNF at discharge although pt is declining this  HPI/Subjective: No events overnight, no N/V, abd pain, agitated overnight and given ativan, very poor PO intake   Objective: Filed Vitals:   02/02/13 2256 02/03/13 0300 02/03/13 0824 02/03/13 1300  BP: 114/73 132/89  132/98  Pulse: 92   68  Temp: 97.2 F (36.2 C)   97.8 F (36.6 C)  TempSrc: Oral   Oral  Resp: 18   16  Height:      Weight:      SpO2: 96%  94% 93%    Intake/Output Summary (Last 24 hours) at 02/03/13 1612 Last data filed at 02/03/13 1300  Gross per 24 hour  Intake     60 ml  Output      0 ml  Net     60 ml    Exam:   General:  Pt is alert, awake, oriented x3, not in acute distress  Cardiovascular: Regular rhythm, tachycardic, S1/S2, no murmurs, no rubs, no gallops  Respiratory: poor air movement B/L, no distress  Abdomen: Soft, non tender, non distended, bowel sounds present, no guarding  Extremities: No edema, pulses DP and PT palpable bilaterally  Neuro: Grossly nonfocal  Data Reviewed: Basic Metabolic Panel:  Recent Labs Lab 01/31/13 1840 01/31/13 2226 02/01/13 0505 02/02/13 0547 02/03/13 0450  NA 138  --  140 137 140  K 2.5*  --  2.8* 4.7 5.7*  CL 87*  --  97 101 104  CO2 31  --  25 19 22   GLUCOSE 110*  --  173* 250* 87  BUN 43*  --  34* 21 15  CREATININE 1.04 0.92 0.87 0.74 0.70  CALCIUM 5.2*  --  5.5* 6.3* 6.6*  MG  --   --  0.8*  1.6  --    Liver Function Tests:  Recent Labs Lab 01/31/13 1840 02/01/13 0505 02/02/13 0547 02/03/13 0450  AST 61* 51* 49* 73*  ALT 19 16 19 27   ALKPHOS 93 58 73 89  BILITOT 5.4* 4.0* 3.2* 2.9*  PROT 6.9 5.9* 6.2 7.1  ALBUMIN 2.6* 2.2* 2.2* 2.5*    Recent Labs Lab 01/31/13 1840  LIPASE 7*   CBC:  Recent Labs Lab 01/31/13 1840 01/31/13 2226 02/03/13 0450  WBC 11.3* 12.7* 13.6*  NEUTROABS 9.3*  --   --   HGB 10.5* 10.4* 10.9*  HCT 30.5* 29.8* 32.3*  MCV 88.7 88.2 89.0  PLT 184 171 213   CBG:  Recent Labs Lab 02/02/13 2338 02/03/13 0735 02/03/13 0853 02/03/13 0922 02/03/13 1150  GLUCAP 126* 57* 66* 200* 107*   Scheduled Meds: . albuterol  2.5 mg Nebulization QID  . calcium carbonate  2 tablet Oral BID WC  . cefTRIAXone (ROCEPHIN) 1 g IVPB  1 g Intravenous Q24H  . docusate sodium  100 mg Oral BID  . dronabinol  2.5 mg Oral QAC lunch  . enoxaparin (LOVENOX) injection  40 mg Subcutaneous Q24H  . feeding supplement  1 Container Oral TID BM  . feeding supplement  1 Container Oral Q1200  . folic acid  1 mg Oral Daily  . ipratropium  0.5 mg Nebulization QID  . levothyroxine  50 mcg Oral QAC breakfast  . multivitamin with minerals  1 tablet Oral Daily  . pantoprazole  40 mg Oral Daily  . sodium chloride  3 mL Intravenous Q12H   Continuous Infusions: . sodium chloride 75 mL/hr at 02/03/13 1416   Jiyan Walkowski, MD  [facility] Pager [phone]  If 7PM-7AM, please contact night-coverage [email or web address] Password [email or web address] 02/03/2013, 4:12 PM   LOS: 3 days

## 2013-02-03 NOTE — Progress Notes (Signed)
Hypoglycemic Event  CBG: *66  Treatment: D50 IV 25 mL  Symptoms: None  Follow-up CBG: Time:1741 CBG Result:197  Possible Reasons for Event: Inadequate meal intake  Comments/MD notified: Mitchel Honour MD    Aggie Hacker A  Remember to initiate Hypoglycemia Order Set & complete

## 2013-02-04 DIAGNOSIS — G9341 Metabolic encephalopathy: Secondary | ICD-10-CM

## 2013-02-04 LAB — GLUCOSE, CAPILLARY
Glucose-Capillary: 68 mg/dL — ABNORMAL LOW (ref 70–99)
Glucose-Capillary: 76 mg/dL (ref 70–99)
Glucose-Capillary: 119 mg/dL — ABNORMAL HIGH (ref 70–99)
Glucose-Capillary: 166 mg/dL — ABNORMAL HIGH (ref 70–99)

## 2013-02-04 LAB — BASIC METABOLIC PANEL
Calcium: 6.2 mg/dL — CL (ref 8.4–10.5)
Creatinine, Ser: 0.69 mg/dL (ref 0.50–1.10)
GFR calc non Af Amer: 82 mL/min — ABNORMAL LOW (ref >90)
Glucose, Bld: 88 mg/dL (ref 70–99)
Sodium: 140 mEq/L (ref 135–145)

## 2013-02-04 NOTE — Progress Notes (Signed)
Patient ID: [name], female   DOB: 02-Jul-1933, 79 y.o.   MRN: [id number] [facility] PROGRESS NOTE  [name] OAC:[id number] DOB: Jul 02, 1933 DOA: 01/31/2013 PCP: Tammy Otto, MD  Brief narrative: Pt is 79 y.o. female, who lives alone at Home, history of chronic alcoholism, diabetes, hypothyroidism, who presented to [facility] ED with main concern of progressively worsening generalized weakness, intermittent episodes of confusion, 2 days of nausea and non bloody vomiting, poor oral intake. Please note that no family was available with pt on admission and pt was not able to provide history. Per daughter, pt has long history of alcohol abuse but is not clear how much adn how often pt drinks.   Principal Problem:   Metabolic encephalopathy  - this is likely multifactorial and secondary to chronic alcoholism,  ETOH induced dementia,  dehydration, UTI - mentation improved yesterday but again with agitation overnight 8/14-15requiring sedation with ativan -mentation somewhat improved today    Hypokalemia - secondary to poor oral intake, ? 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 exacerbated by starvation state    COPD - stable, nebs PRN    Hypocalcemia - continue calcium gluconate, add Tums BID    DM (diabetes mellitus) with hypoglycemia - hbA1C is 5 - will DC novolog SSI  And only check CBGs     Leukocytosis, unspecified -stable    Hypothyroid - TSH is within normal limits  - continue Synthroid     Severe Malnutrition - severe secondary to alcohol abuse, anorexia  - appreciate nutrition consultation -now on Marinol -PO intake fluctuates  Constipation -Add miralax PRN/colace  Consultants:  None  Procedures/Studies: Dg Chest Port 1 View   01/31/2013   No active disease.    Antibiotics:  Rocephin 8/12 -->  Zithromax 8/13 -->   Code Status: Full Family Communication: none at bedside, d/w daughter [name] Disposition Plan: needs SNF at discharge although pt is declining this, SNF monday  HPI/Subjective: Doing better, ate some breakfast this am Objective: Filed Vitals:   02/03/13 1929 02/03/13 2023 02/04/13 0655 02/04/13 0842  BP: 137/86  129/89   Pulse: 116  113   Temp: 97.9 F (36.6 C)  98.1 F (36.7 C)   TempSrc: Oral  Oral   Resp: 14  16   Height:      Weight:      SpO2: 100% 100% 98% 93%    Intake/Output Summary (Last 24 hours) at 02/04/13 1022 Last data filed at 02/04/13 0400  Gross per 24 hour  Intake   1690 ml  Output    200 ml  Net   1490 ml    Exam:   General:  Pt is alert, awake, oriented x3, not in acute distress  Cardiovascular: Regular rhythm, tachycardic, S1/S2, no murmurs, no rubs, no gallops  Respiratory: poor air movement B/L, no distress  Abdomen: Soft, non tender, non distended, bowel sounds present, no guarding  Extremities: No edema, pulses DP and PT  palpable bilaterally  Neuro: Grossly nonfocal  Data Reviewed: Basic Metabolic Panel:  Recent Labs Lab 02/01/13 0505 02/02/13 0547 02/03/13 0450 02/03/13 1603 02/04/13 0543  NA 140 137 140 140 140  K 2.8* 4.7 5.7* 4.5 4.1  CL 97 101 104 108 108  CO2 25 19 22 20 21   GLUCOSE 173* 250* 87 82 88  BUN 34* 21 15 13 10   CREATININE 0.87 0.74 0.70 0.69 0.69  CALCIUM 5.5* 6.3* 6.6* 6.2* 6.2*  MG 0.8* 1.6  --   --   --    Liver Function Tests:  Recent  Labs Lab 01/31/13 1840 02/01/13 0505 02/02/13 0547 02/03/13 0450  AST 61* 51* 49* 73*  ALT 19 16 19 27   ALKPHOS 93 58 73 89  BILITOT 5.4* 4.0* 3.2* 2.9*  PROT 6.9 5.9* 6.2 7.1  ALBUMIN 2.6* 2.2* 2.2* 2.5*    Recent Labs Lab 01/31/13 1840  LIPASE 7*   CBC:  Recent Labs Lab 01/31/13 1840 01/31/13 2226 02/03/13 0450  WBC 11.3* 12.7* 13.6*  NEUTROABS 9.3*  --   --   HGB 10.5* 10.4* 10.9*  HCT 30.5* 29.8* 32.3*  MCV 88.7 88.2 89.0  PLT 184 171 213   CBG:  Recent Labs Lab 02/03/13 1645 02/03/13 1741 02/03/13 2132 02/04/13 0801 02/04/13 0927  GLUCAP 66* 197* 81 68* 76   Scheduled Meds: . calcium carbonate  2 tablet Oral BID WC  . cefTRIAXone (ROCEPHIN) 1 g IVPB  1 g Intravenous Q24H  . docusate sodium  100 mg Oral BID  . dronabinol  2.5 mg Oral QAC lunch  . enoxaparin (LOVENOX) injection  40 mg Subcutaneous Q24H  . feeding supplement  1 Container Oral TID BM  . feeding supplement  1 Container Oral Q1200  . folic acid  1 mg Oral Daily  . ipratropium  0.5 mg Nebulization QID  . levothyroxine  50 mcg Oral QAC breakfast  . multivitamin with minerals  1 tablet Oral Daily  . pantoprazole  40 mg Oral Daily  . sodium chloride  3 mL Intravenous Q12H   Continuous Infusions:   Zannie Cove, MD  [facility] Pager [phone]  If 7PM-7AM, please contact night-coverage [email or web address] Password [email or web address] 02/04/2013, 10:22 AM   LOS: 4 days

## 2013-02-04 NOTE — Progress Notes (Signed)
PT Cancellation Note  Patient Details Name: [name] MRN: [id number] DOB: January 15, 1934   Cancelled Treatment:    Reason Eval/Treat Not Completed: Medical issues which prohibited therapy (HR in 120's at rest.)   Rada Hay 02/04/2013, 11:32 AM Blanchard Kelch PT [phone]

## 2013-02-05 LAB — GLUCOSE, CAPILLARY

## 2013-02-05 MED ORDER — DEXTROSE 5 % IV SOLN
1.0000 g | INTRAVENOUS | Status: AC
  Administered 2013-02-05: 1 g via INTRAVENOUS
  Filled 2013-02-05: qty 10

## 2013-02-05 MED ORDER — POLYETHYLENE GLYCOL 3350 17 G PO PACK
17.0000 g | PACK | Freq: Two times a day (BID) | ORAL | Status: DC | PRN
  Filled 2013-02-05: qty 1

## 2013-02-05 MED ORDER — METOPROLOL TARTRATE 12.5 MG HALF TABLET
12.5000 mg | ORAL_TABLET | Freq: Two times a day (BID) | ORAL | Status: DC
  Administered 2013-02-05 – 2013-02-06 (×3): 12.5 mg via ORAL
  Filled 2013-02-05 (×4): qty 1

## 2013-02-05 NOTE — Progress Notes (Signed)
Physical Therapy Treatment Patient Details Name: [name] MRN: [id number] DOB: 18-Jun-1934 Today's Date: 02/05/2013 Time: 4098-1191 PT Time Calculation (min): 17 min  PT Assessment / Plan / Recommendation  History of Present Illness 79 y.o. female, who lives alone and has been a resident of [facility] in the past, presenting today with 2 weeks history of worsening of well being. According to her daughter in the last 2 weeks she has been getting weaker with nausea vomiting and constipation. There is a history of alcoholism but not sure how much she drinks. The patient denies any pain anywhere on her body but she feels weak. No cough fever or chills reported   PT Comments   *Pt more alert and verbal compared to previous visit. Supine to sit with +2 assist, attempted to stand with +2 total assist but BLEs completely buckled. Pt abruptly had uncontrolled return to supine while sitting on EOB stating she needed to lie down. **  Follow Up Recommendations  SNF     Does the patient have the potential to tolerate intense rehabilitation     Barriers to Discharge        Equipment Recommendations  None recommended by PT    Recommendations for Other Services OT consult  Frequency Min 3X/week   Progress towards PT Goals Progress towards PT goals: Progressing toward goals  Plan Current plan remains appropriate    Precautions / Restrictions Precautions Precautions: Fall Restrictions Weight Bearing Restrictions: No   Pertinent Vitals/Pain *pt denied pain**    Mobility  Bed Mobility Bed Mobility: Supine to Sit;Sit to Supine Supine to Sit: 1: +2 Total assist;HOB elevated;With rails Supine to Sit: Patient Percentage: 30% Sit to Supine: 1: +2 Total assist;HOB elevated;With rail Sit to Supine: Patient Percentage: 30% Details for Bed Mobility Assistance: Assist for trunk to upright/supine and bil LEs off/onto bed. While sitting on EOB pt abruptly returned to supine.   Transfers Transfers: Sit to Stand Sit to Stand: 1: +2 Total assist Details for Transfer Assistance: unable to achieve standing position, BLEs buckled Ambulation/Gait Ambulation/Gait Assistance: Not tested (comment)    Exercises     PT Diagnosis:    PT Problem List:   PT Treatment Interventions:     PT Goals (current goals can now be found in the care plan section) Acute Rehab PT Goals Patient Stated Goal: rest.  PT Goal Formulation: With patient/family Time For Goal Achievement: 02/15/13 Potential to Achieve Goals: Fair  Visit Information  Last PT Received On: 02/05/13 Assistance Needed: +2 History of Present Illness: 79 y.o. female, who lives alone and has been a resident of [facility] in the past, presenting today with 2 weeks history of worsening of well being. According to her daughter in the last 2 weeks she has been getting weaker with nausea vomiting and constipation. There is a history of alcoholism but not sure how much she drinks. The patient denies any pain anywhere on her body but she feels weak. No cough fever or chills reported    Subjective Data  Patient Stated Goal: rest.    Cognition  Cognition Arousal/Alertness: Awake/alert Behavior During Therapy: Anxious Overall Cognitive Status: Within Functional Limits for tasks assessed    Balance  Balance Balance Assessed: Yes Static Sitting Balance Static Sitting - Balance Support: Feet supported;Bilateral upper extremity supported Static Sitting - Level of Assistance: 2: Max assist Static Sitting - Comment/# of Minutes: pt with intermittent levels of assist required for static sitting, she abruptly had uncontrolled return  to supine (unclear if this was intentional or LOB) stating she needed to lie down  End of Session PT - End of Session Equipment Utilized During Treatment: Gait belt Activity Tolerance: Patient limited by fatigue Patient left: in bed (with all rails up) Nurse Communication: Mobility  status   GP     Ralene Bathe Kistler 02/05/2013, 11:32 AM [phone]

## 2013-02-05 NOTE — Progress Notes (Signed)
Patient ID: [name], female   DOB: Feb 05, 1934, 79 y.o.   MRN: [id number] [facility] PROGRESS NOTE  LETITIA SABALA OZH:[id number] DOB: 05/24/1934 DOA: 01/31/2013 PCP: Ginette Otto, MD  Brief narrative: Pt is 79 y.o. female, who lives alone at Home, history of chronic alcoholism, diabetes, hypothyroidism, who presented to [facility] ED with main concern of progressively worsening generalized weakness, intermittent episodes of confusion, 2 days of nausea and non bloody vomiting, poor oral intake. Please note that no family was available with pt on admission and pt was not able to provide history. Per daughter, pt has long history of alcohol abuse but is not clear about quantity  Principal Problem:   Metabolic encephalopathy  - this is likely multifactorial and secondary to chronic alcoholism,  ETOH induced dementia,  dehydration, UTI - mentation improved yesterday but again with agitation overnight 8/14-15requiring sedation with ativan -mentation somewhat improved now    Hypokalemia - secondary to poor oral intake, ? 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 exacerbated by starvation state    COPD - stable, nebs PRN    Hypocalcemia - continue calcium gluconate, add Tums BID    DM (diabetes mellitus) with hypoglycemia - hbA1C is 5 - will DC novolog SSI  And only check CBGs    Leukocytosis,  unspecified -stable    Hypothyroid - TSH is within normal limits  - continue Synthroid     Severe Malnutrition - severe secondary to alcohol abuse, anorexia  - appreciate nutrition consultation -now on Marinol -PO intake much improved  Constipation - miralax change to BID, Dc colace, add senokot  Sinus tachycardia: -likely due to marinol, no hypoxia -add low dose metoprolol  Consultants:  None  Procedures/Studies: Dg Chest Port 1 View   01/31/2013   No active disease.    Antibiotics:  Rocephin 8/12 -->  Zithromax 8/13 -->   Code Status: Full Family Communication: none at bedside, d/w daughter Tobias Alexander Disposition Plan: needs SNF at discharge although pt is declining this, SNF monday  HPI/Subjective: Doing better, ate some breakfast this am Objective: Filed Vitals:   02/04/13 2056 02/04/13 2220 02/05/13 0644 02/05/13 0848  BP:  138/87 125/81   Pulse:  79 110   Temp:  98.8 F (37.1 C) 97.2 F (36.2 C)   TempSrc:  Oral Oral   Resp:  20 16   Height:      Weight:      SpO2: 100% 94% 100% 96%    Intake/Output Summary (Last 24 hours) at 02/05/13 1054 Last data filed at 02/05/13 0700  Gross per 24 hour  Intake    290 ml  Output      0 ml  Net    290 ml    Exam:   General:  Pt is alert, awake, oriented x3, not in acute distress  Cardiovascular: Regular rhythm, tachycardic, S1/S2, no murmurs, no rubs, no gallops  Respiratory: poor  air movement B/L, no distress  Abdomen: Soft, non tender, non distended, bowel sounds present, no guarding  Extremities: No edema, pulses DP and PT palpable bilaterally  Neuro: Grossly nonfocal  Data Reviewed: Basic Metabolic Panel:  Recent Labs Lab 02/01/13 0505 02/02/13 0547 02/03/13 0450 02/03/13 1603 02/04/13 0543  NA 140 137 140 140 140  K 2.8* 4.7 5.7* 4.5 4.1  CL 97 101 104 108 108  CO2 25 19 22 20 21   GLUCOSE 173* 250* 87 82 88  BUN 34* 21 15 13 10   CREATININE 0.87 0.74 0.70 0.69 0.69   CALCIUM 5.5* 6.3* 6.6* 6.2* 6.2*  MG 0.8* 1.6  --   --   --    Liver Function Tests:  Recent Labs Lab 01/31/13 1840 02/01/13 0505 02/02/13 0547 02/03/13 0450  AST 61* 51* 49* 73*  ALT 19 16 19 27   ALKPHOS 93 58 73 89  BILITOT 5.4* 4.0* 3.2* 2.9*  PROT 6.9 5.9* 6.2 7.1  ALBUMIN 2.6* 2.2* 2.2* 2.5*    Recent Labs Lab 01/31/13 1840  LIPASE 7*   CBC:  Recent Labs Lab 01/31/13 1840 01/31/13 2226 02/03/13 0450  WBC 11.3* 12.7* 13.6*  NEUTROABS 9.3*  --   --   HGB 10.5* 10.4* 10.9*  HCT 30.5* 29.8* 32.3*  MCV 88.7 88.2 89.0  PLT 184 171 213   CBG:  Recent Labs Lab 02/04/13 0927 02/04/13 1153 02/04/13 1641 02/04/13 2251 02/05/13 0747  GLUCAP 76 119* 166* 143* 81   Scheduled Meds: . calcium carbonate  2 tablet Oral BID WC  . cefTRIAXone (ROCEPHIN) 1 g IVPB  1 g Intravenous Q24H  . docusate sodium  100 mg Oral BID  . dronabinol  2.5 mg Oral QAC lunch  . enoxaparin (LOVENOX) injection  40 mg Subcutaneous Q24H  . feeding supplement  1 Container Oral TID BM  . feeding supplement  1 Container Oral Q1200  . folic acid  1 mg Oral Daily  . ipratropium  0.5 mg Nebulization QID  . levothyroxine  50 mcg Oral QAC breakfast  . metoprolol tartrate  12.5 mg Oral BID  . multivitamin with minerals  1 tablet Oral Daily  . pantoprazole  40 mg Oral Daily  . sodium chloride  3 mL Intravenous Q12H   Continuous Infusions:   Zannie Cove, MD  [facility] Pager [phone]  If 7PM-7AM, please contact night-coverage [email or web address] Password [email or web address] 02/05/2013, 10:54 AM   LOS: 5 days

## 2013-02-06 LAB — GLUCOSE, CAPILLARY: Glucose-Capillary: 107 mg/dL — ABNORMAL HIGH (ref 70–99)

## 2013-02-06 MED ORDER — ENSURE PUDDING PO PUDG: 1.0000 | Freq: Three times a day (TID) | ORAL | Status: DC

## 2013-02-06 MED ORDER — DRONABINOL 2.5 MG PO CAPS: 2.5000 mg | ORAL_CAPSULE | Freq: Every day | ORAL | Status: DC

## 2013-02-06 MED ORDER — POLYETHYLENE GLYCOL 3350 17 G PO PACK: 17.0000 g | PACK | Freq: Two times a day (BID) | ORAL | Status: DC | PRN

## 2013-02-06 MED ORDER — METOPROLOL TARTRATE 12.5 MG HALF TABLET: 12.5000 mg | ORAL_TABLET | Freq: Two times a day (BID) | ORAL | Status: DC

## 2013-02-06 MED ORDER — IPRATROPIUM BROMIDE 0.02 % IN SOLN
0.5000 mg | Freq: Three times a day (TID) | RESPIRATORY_TRACT | Status: DC
  Administered 2013-02-06 (×2): 0.5 mg via RESPIRATORY_TRACT
  Filled 2013-02-06 (×2): qty 2.5

## 2013-02-06 MED ORDER — ALBUTEROL SULFATE (5 MG/ML) 0.5% IN NEBU: 2.5000 mg | INHALATION_SOLUTION | RESPIRATORY_TRACT | Status: DC | PRN

## 2013-02-06 MED ORDER — ALBUTEROL SULFATE (5 MG/ML) 0.5% IN NEBU
2.5000 mg | INHALATION_SOLUTION | Freq: Three times a day (TID) | RESPIRATORY_TRACT | Status: DC
  Administered 2013-02-06 (×2): 2.5 mg via RESPIRATORY_TRACT
  Filled 2013-02-06 (×2): qty 0.5

## 2013-02-06 MED ORDER — DOCUSATE SODIUM 100 MG PO CAPS
100.0000 mg | ORAL_CAPSULE | Freq: Every day | ORAL | Status: DC | PRN
  Filled 2013-02-06: qty 1

## 2013-02-06 MED ORDER — ADULT MULTIVITAMIN W/MINERALS CH: 1.0000 | ORAL_TABLET | Freq: Every day | ORAL | Status: DC

## 2013-02-06 MED ORDER — IPRATROPIUM BROMIDE 0.02 % IN SOLN: 0.5000 mg | Freq: Four times a day (QID) | RESPIRATORY_TRACT | Status: DC | PRN

## 2013-02-06 MED ORDER — BOOST / RESOURCE BREEZE PO LIQD: 1.0000 | Freq: Every day | ORAL | Status: DC

## 2013-02-06 NOTE — Progress Notes (Signed)
Patient is set to discharge to [facility] today. Patient & daughter, Sue Lush at bedside & aware. Discharge packet given to RN, Boneta Lucks. Daughter to transport to facility.   Clinical Social Work Department CLINICAL SOCIAL WORK PLACEMENT NOTE 02/06/2013  Patient:  [name], [name]  Account Number:  [id number] Admit date:  01/31/2013  Clinical Social Worker:  Orpah Greek  Date/time:  02/01/2013 04:34 PM  Clinical Social Work is seeking post-discharge placement for this patient at the following level of care:   SKILLED NURSING   (*CSW will update this form in Epic as items are completed)   02/01/2013  Patient/family provided with [facility] Department of Clinical Social Work's list of facilities offering this level of care within the geographic area requested by the patient (or if unable, by the patient's family).  02/01/2013  Patient/family informed of their freedom to choose among providers that offer the needed level of care, that participate in Medicare, Medicaid or managed care program needed by the patient, have an available bed and are willing to accept the patient.  02/01/2013  Patient/family informed of [facility]' ownership interest in [facility], as well as of the fact that they are under no obligation to receive care at this facility.  PASARR submitted to EDS on 02/01/2013 PASARR number received from EDS on 02/01/2013  FL2 transmitted to all facilities in geographic area requested by pt/family on  02/01/2013 FL2 transmitted to all facilities within larger geographic area on   Patient informed that his/her managed care company has contracts with or will negotiate with  certain facilities, including the following:     Patient/family informed of bed offers received:  02/06/2013 Patient chooses bed at [facility] Physician recommends and patient chooses bed at    Patient to be transferred to [facility] on   02/06/2013 Patient to be transferred to facility by daughter's car  The following physician request were entered in Epic:   Additional Comments:   Unice Bailey, LCSW [facility] Clinical Social Worker cell #: [phone]

## 2013-02-06 NOTE — Discharge Summary (Signed)
Physician Discharge Summary  JAHNESSA VANDUYN ZOX:[id number] DOB: 01-13-1934 DOA: 01/31/2013  PCP: Ginette Otto, MD  Admit date: 01/31/2013 Discharge date: 02/06/2013  Time spent: 50 minutes  Recommendations for Outpatient Follow-up:  1. PCP in 1 week  Discharge Diagnoses:    Metabolic encephalopathy   Mild Cognitive dysfunction vs Early Dementia   Anemia   Hypothyroid   Hypokalemia   DM (diabetes mellitus)   UTI (lower urinary tract infection)   Alcohol abuse with intoxication   Leukocytosis, unspecified   Severe protein calorie malnutrition  Discharge Condition:stable  Diet recommendation:heart healthy  Filed Weights   01/31/13 2135 02/01/13 1100 02/06/13 0621  Weight: 83.4 kg (183 lb 13.8 oz) 41.589 kg (91 lb 11 oz) 64.2 kg (141 lb 8.6 oz)    History of present illness:  [name] is a 79 y.o. female, who lives alone presented to the ER with 2 weeks history of worsening of well being. According to her daughter in the last 2 weeks she has been getting weaker with nausea vomiting and constipation. There is a history of alcoholism but not sure how much she drinks. The patient denies any pain anywhere on her body but she feels weak. No cough fever or chills reported.   Hospital Course:  Metabolic encephalopathy  - this is likely multifactorial and secondary to chronic alcoholism, ETOH induced dementia, dehydration, UTI  - mentation improved 8/14 but again with agitation overnight on 8/14-15 requiring sedation with ativan. -mentation gradually improving ever since.  Hypokalemia  - secondary to poor oral intake, ? Alcohol use and malnutrition and low magnesium  - improved   Hypomagnesemia  - s/p 4 gm IV Mag 8/13  -improved   Ecoli UTI   - Cultures with 100K Ecoli, pansensitive - completed 5 days of ceftriaxone, stopped Abx after 5 days   Alcohol abuse  - chronic long standing history  - no evidence of withdrawal, was monitored on CIWA and  supplemented with multivitamins  Mild Transaminitis with elevated bilirubin  - AST/ALT in the pattern of alcohol abuse  - abdominal US with biliary sludge, No gall stones or evidence of acute cholecystitis or CBD dilation. 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 with conjugated hyperbili exacerbated by starvation state   COPD  - stable, nebs PRN   Hypocalcemia  - continue calcium gluconate, add Tums BID   DM (diabetes mellitus) with hypoglycemia  - hbA1C is 5  - did have hypoglycemic episodes initially, does not need to be on a carb modified diet due to propensity for hypoglycemia  Hypothyroid  - TSH is within normal limits  - continue Synthroid   Severe Malnutrition  - severe secondary to alcohol abuse, anorexia, poor PO intake - appreciate nutrition consultation  -now on Marinol  -PO intake much improved   Constipation  - resolved -treated with miralax and  senokot   Discharge Exam: Filed Vitals:   02/06/13 0707  BP: 109/62  Pulse: 92  Temp: 97.7 F (36.5 C)  Resp: 20    General: alert, awake, oriented to self, place and partly to time Cardiovascular: S1S2/RRR Respiratory: CTAB  Discharge Instructions  Discharge Orders   Future Orders Complete By Expires   Diet - low sodium heart healthy  As directed     As directed    Increase activity slowly  As directed        Medication List         albuterol (5 MG/ML) 0.5% nebulizer solution  Commonly known as:  PROVENTIL  Take 0.5 mL (2.5 mg total) by nebulization every 4 (four) hours as needed for wheezing.     dronabinol 2.5 MG capsule  Commonly known as:  MARINOL  Take 1 capsule (2.5 mg total) by mouth daily before lunch.     feeding supplement Liqd  Take 1 Container by mouth daily at 12 noon.     feeding supplement Pudg  Take 1 Container by mouth 3 (three) times daily between meals.     LEVOTHYROXINE SODIUM PO  Take 1 tablet by mouth daily.     metoprolol tartrate 12.5 mg Tabs tablet   Commonly known as:  LOPRESSOR  Take 0.5 tablets (12.5 mg total) by mouth 2 (two) times daily.     multivitamin with minerals Tabs tablet  Take 1 tablet by mouth daily.     NEXIUM PO  Take 1 capsule by mouth daily.     polyethylene glycol packet  Commonly known as:  MIRALAX / GLYCOLAX  Take 17 g by mouth 2 (two) times daily as needed (constipation).       No Known Allergies    The results of significant diagnostics from this hospitalization (including imaging, microbiology, ancillary and laboratory) are listed below for reference.    Significant Diagnostic Studies: US Abdomen Complete  02/01/2013   *RADIOLOGY REPORT*  Clinical Data:  Elevated liver function tests  COMPLETE ABDOMINAL ULTRASOUND  Comparison:  05/27/11  Findings:  Gallbladder:  No gallstones are identified.  Gallbladder sludge is seen within a well distended gallbladder.  No wall thickening is noted.  Common bile duct:  3.8 mm and within normal limits.  Liver:  Increased echogenicity is noted consistent with fatty infiltration. Nofocal mass lesion is noted.  IVC:  Appears normal.  Pancreas:  No focal abnormality seen.  Spleen:  4.0 cm.  Right Kidney:  11.0 cm.  No mass lesion or hydronephrosis is noted.  Left Kidney:  10.5 cm.  No mass lesion or hydronephrosis is noted.  Abdominal aorta:  No aneurysm identified.  A small amount of ascites is noted.  IMPRESSION: Gallbladder sludge without definitive stones.  Small amount of ascites.  Fatty liver.   Original Report Authenticated By: Alcide Clever, M.D.   Dg Chest Port 1 View  01/31/2013   *RADIOLOGY REPORT*  Clinical Data: Chest pain.  COPD.  Weakness.  Diabetes and hypertension.  CHEST - 1 VIEW  Comparison:  07/18/2012  Findings: The heart size and mediastinal contours are within normal limits.  Both lungs are clear.  IMPRESSION: No active disease.   Original Report Authenticated By: Myles Rosenthal, M.D.    Microbiology: Recent Results (from the past 240 hour(s))  URINE CULTURE      Status: None   Collection Time    01/31/13  7:04 PM      Result Value Range Status   Specimen Description URINE, RANDOM   Final   Special Requests NONE   Final   Culture  Setup Time     Final   Value: 01/31/2013 23:08     Performed at [facility] Count     Final   Value: >=100,000 COLONIES/ML     Performed at [facility]   Culture     Final   Value: ESCHERICHIA COLI     Performed at [facility]   Report Status 02/03/2013 FINAL   Final   Organism ID, Bacteria ESCHERICHIA COLI   Final  Labs: Basic Metabolic Panel:  Recent Labs Lab 02/01/13 0505 02/02/13 0547 02/03/13 0450 02/03/13 1603 02/04/13 0543  NA 140 137 140 140 140  K 2.8* 4.7 5.7* 4.5 4.1  CL 97 101 104 108 108  CO2 25 19 22 20 21   GLUCOSE 173* 250* 87 82 88  BUN 34* 21 15 13 10   CREATININE 0.87 0.74 0.70 0.69 0.69  CALCIUM 5.5* 6.3* 6.6* 6.2* 6.2*  MG 0.8* 1.6  --   --   --    Liver Function Tests:  Recent Labs Lab 01/31/13 1840 02/01/13 0505 02/02/13 0547 02/03/13 0450  AST 61* 51* 49* 73*  ALT 19 16 19 27   ALKPHOS 93 58 73 89  BILITOT 5.4* 4.0* 3.2* 2.9*  PROT 6.9 5.9* 6.2 7.1  ALBUMIN 2.6* 2.2* 2.2* 2.5*    Recent Labs Lab 01/31/13 1840  LIPASE 7*   No results found for this basename: AMMONIA,  in the last 168 hours CBC:  Recent Labs Lab 01/31/13 1840 01/31/13 2226 02/03/13 0450  WBC 11.3* 12.7* 13.6*  NEUTROABS 9.3*  --   --   HGB 10.5* 10.4* 10.9*  HCT 30.5* 29.8* 32.3*  MCV 88.7 88.2 89.0  PLT 184 171 213   Cardiac Enzymes: No results found for this basename: CKTOTAL, CKMB, CKMBINDEX, TROPONINI,  in the last 168 hours BNP: BNP (last 3 results) No results found for this basename: PROBNP,  in the last 8760 hours CBG:  Recent Labs Lab 02/05/13 0747 02/05/13 1158 02/05/13 1658 02/05/13 2156 02/06/13 0744  GLUCAP 81 111* 116* 110* 107*       Signed:  Raun Routh  [facility] 02/06/2013, 10:10 AM

## 2013-02-06 NOTE — Progress Notes (Signed)
Physical Therapy Treatment Patient Details Name: [name] MRN: [id number] DOB: [birth year] Today's Date: 02/06/2013 Time: 4098-1191 PT Time Calculation (min): 16 min  PT Assessment / Plan / Recommendation  History of Present Illness Pt continued to have weakness in LE's with buckling of knees with attempts to stand   PT Comments   Pt demonstrated weakness in both LEs with decreased general  activity tolerance  Follow Up Recommendations  SNF     Does the patient have the potential to tolerate intense rehabilitation     Barriers to Discharge        Equipment Recommendations  None recommended by PT    Recommendations for Other Services OT consult  Frequency Min 3X/week   Progress towards PT Goals Progress towards PT goals: Progressing toward goals  Plan Current plan remains appropriate    Precautions / Restrictions Precautions Precautions: Fall Restrictions Weight Bearing Restrictions: No   Pertinent Vitals/Pain Pain in legs with ROM    Mobility  Bed Mobility Bed Mobility: Rolling Right;Rolling Left;Right Sidelying to Sit;Sit to Supine;Supine to Sit Rolling Right: 4: Min assist Rolling Left: 4: Min assist Right Sidelying to Sit: 3: Mod assist Supine to Sit: 3: Mod assist Details for Bed Mobility Assistance: Pt was able to push up with arms to get into sitting and was able to sit unsupported for a few minutes, but fatigues easily Transfers Details for Transfer Assistance: pt fatigued in sitting on EOB and was uanble to get to standing    Exercises General Exercises - Lower Extremity Ankle Circles/Pumps: AROM;Both;10 reps;Supine Gluteal Sets: AROM;Both;5 reps;Sidelying Short Arc Quad: AROM;Both;10 reps;Supine Hip ABduction/ADduction: AAROM;Both;10 reps;Supine Hip Flexion/Marching: AROM;Both;10 reps;Supine Other Exercises Other Exercises: bridging.   Other Exercises: static holding legs in hook lying position   PT Diagnosis:    PT Problem List:   PT  Treatment Interventions:     PT Goals (current goals can now be found in the care plan section)    Visit Information  Last PT Received On: 02/06/13 History of Present Illness: Pt continued to have weakness in LE's with buckling of knees with attempts to stand    Subjective Data      Cognition  Cognition Arousal/Alertness: Awake/alert Behavior During Therapy: WFL for tasks assessed/performed Overall Cognitive Status: Within Functional Limits for tasks assessed    Balance  Static Sitting Balance Static Sitting - Balance Support: Feet supported;No upper extremity supported;Right upper extremity supported Static Sitting - Level of Assistance: 5: Stand by assistance;4: Min assist Static Sitting - Comment/# of Minutes: pt fatigues easily  End of Session PT - End of Session Activity Tolerance: Patient limited by fatigue Patient left: in bed;with family/visitor present Nurse Communication: Mobility status   GP    Rosey Bath K. [place], [place] [phone] 02/06/2013, 2:49 PM

## 2013-02-07 ENCOUNTER — Other Ambulatory Visit: Payer: Self-pay | Admitting: Geriatric Medicine

## 2013-02-07 MED ORDER — DRONABINOL 2.5 MG PO CAPS: 2.5000 mg | ORAL_CAPSULE | Freq: Every day | ORAL | Status: DC

## 2013-02-08 ENCOUNTER — Non-Acute Institutional Stay (SKILLED_NURSING_FACILITY): Payer: Medicare Other | Admitting: Internal Medicine

## 2013-02-08 ENCOUNTER — Encounter: Payer: Self-pay | Admitting: Internal Medicine

## 2013-02-08 DIAGNOSIS — D696 Thrombocytopenia, unspecified: Secondary | ICD-10-CM

## 2013-02-08 DIAGNOSIS — E43 Unspecified severe protein-calorie malnutrition: Secondary | ICD-10-CM | POA: Insufficient documentation

## 2013-02-08 DIAGNOSIS — E039 Hypothyroidism, unspecified: Secondary | ICD-10-CM

## 2013-02-08 DIAGNOSIS — E119 Type 2 diabetes mellitus without complications: Secondary | ICD-10-CM

## 2013-02-08 DIAGNOSIS — F1910 Other psychoactive substance abuse, uncomplicated: Secondary | ICD-10-CM

## 2013-02-08 NOTE — Assessment & Plan Note (Addendum)
Mild LFT and Bili elevation c/w; some agitation at hospital requiring ativan; hypokalemia c/w and with malnutrition;hypomagnesium c/w; hypocalcemia c/w;improved diet, supplements and MVI

## 2013-02-08 NOTE — Progress Notes (Signed)
MRN: [id number] Name: [name]  Sex: female Age: 78 y.o. DOB: 06-Dec-1933  [id number] Facility/Room:120 Level Of Care: SNF Provider: Merrilee Seashore D Emergency Contacts: Extended Emergency Contact Information Primary Emergency Contact: Junius Finner [place] Home Phone: [phone] Mobile Phone: [phone] Relation: Daughter  Code Status: Full  Allergies: Review of patient's allergies indicates no known allergies.  Chief Complaint  Patient presents with  . nursing home admission    HPI: Patient is 79 y.o. female who presented with MS change, malnutrition, electrolyte abnormalities sec to ETOH abuse admitted here for PT and nutritional support.   Past Medical History  Diagnosis Date  . Abdominal pain   . Hypothyroid   . Diabetes mellitus   . COPD (chronic obstructive pulmonary disease)   . GERD (gastroesophageal reflux disease)   . Blood transfusion   . Hypertension   . Urinary tract infection   . Blood dyscrasia     HYPOKALEMIA  . Substance abuse     ETOH with dementia and prior encephalopathy and withdrawal    Past Surgical History  Procedure Laterality Date  . Abdominal hysterectomy    . Esophagogastroduodenoscopy  05/17/2011    Procedure: ESOPHAGOGASTRODUODENOSCOPY (EGD);  Surgeon: Hart Carwin, MD;  Location: [facility] ENDOSCOPY;  Service: Endoscopy;  Laterality: N/A;      Medication List       This list is accurate as of: 02/08/13  3:24 PM.  Always use your most recent med list.               albuterol (5 MG/ML) 0.5% nebulizer solution  Commonly known as:  PROVENTIL  Take 0.5 mL (2.5 mg total) by nebulization every 4 (four) hours as needed for wheezing.     dronabinol 2.5 MG capsule  Commonly known as:  MARINOL  Take 1 capsule (2.5 mg total) by mouth daily before lunch.     feeding supplement Liqd  Take 1 Container by mouth daily at 12 noon.     feeding supplement Pudg  Take 1 Container by  mouth 3 (three) times daily between meals.     LEVOTHYROXINE SODIUM PO  Take 1 tablet by mouth daily.     metoprolol tartrate 12.5 mg Tabs tablet  Commonly known as:  LOPRESSOR  Take 0.5 tablets (12.5 mg total) by mouth 2 (two) times daily.     multivitamin with minerals Tabs tablet  Take 1 tablet by mouth daily.     NEXIUM PO  Take 1 capsule by mouth daily.     polyethylene glycol packet  Commonly known as:  MIRALAX / GLYCOLAX  Take 17 g by mouth 2 (two) times daily as needed (constipation).        No orders of the defined types were placed in this encounter.     There is no immunization history on file for this patient.  History  Substance Use Topics  . Smoking status: Current Every Day Smoker -- 0.50 packs/day for 30 years    Types: Cigarettes  . Smokeless tobacco: Never Used  . Alcohol Use: Yes     Comment: Uknown How much, daughter thinks she drinks heavy, 01/31/13 - "shots on a regular basis"    Family history is noncontributory    Review of Systems  DATA OBTAINED: from patient, nurse Pt c/o she wants to go to bed;(e has only been up 3 hours); leg weakness ; pt can't say  Filed Vitals:  02/08/13 1457  BP: 137/74  Pulse: 77  Temp: 97.6 F (36.4 C)  Resp: 18    Physical Exam  GENERAL APPEARANCE: AwakeAppropriately groomed. No acute distress.  SKIN: No diaphoresis rash, or wounds HEAD: Normocephalic, atraumatic  EYES: Conjunctiva/lids clear. Pupils round, reactive. EOMs intact.  EARS: External exam WNL, canals clear. Hearing grossly normal.  NOSE: No deformity or discharge.  MOUTH/THROAT: Lips w/o lesions.   RESPIRATORY: Breathing is even, unlabored. slt Coarse BS with a wheeze that resolved with a cough  CARDIOVASCULAR: Heart RRR no murmurs, rubs or gallops. traceperipheral edema.   VENOUS: No varicosities. No venous stasis skin changes  GASTROINTESTINAL: Abdomen is soft, non-tender, not distended w/ normal bowel sounds.  MUSCULOSKELETAL: mod  muscle atrophy; mild arthritis NEUROLOGIC: Oriented X1 Cranial nerves 2-12 grossly intact. Moves all extremities no tremor. PSYCHIATRIC: dementia affect; no behavioral issues  Patient Active Problem List   Diagnosis Date Noted  . Severe malnutrition 02/08/2013  . Substance abuse   . Encephalopathy, metabolic 02/03/2013  . Leukocytosis, unspecified 02/01/2013  . Alcohol dependence 07/19/2012  . Hypomagnesemia 07/19/2012  . Hypocalcemia 07/18/2012  . Alcohol abuse with intoxication 02/11/2012  . Altered mental state 01/06/2012  . Alcohol withdrawal 01/06/2012  . Weakness 01/04/2012  . UTI (lower urinary tract infection) 01/04/2012  . Thrombocytopenia 05/26/2011  . DM (diabetes mellitus) 05/26/2011  . Hypertriglyceridemia 05/26/2011  . Upper GI bleed 05/26/2011  . C. difficile colitis 05/25/2011  . Acute renal failure 05/25/2011  . Gastritis 05/25/2011  . Encephalopathy acute 05/25/2011  . Acute respiratory failure 05/25/2011  . Anemia 05/25/2011  . Alcohol induced fatty liver 05/25/2011  . Hypothyroid 05/25/2011  . Hypokalemia 05/25/2011  . Septic shock 05/25/2011       CBC    Component Value Date/Time   WBC 13.6* 02/03/2013 0450   RBC 3.63* 02/03/2013 0450   HGB 10.9* 02/03/2013 0450   HCT 32.3* 02/03/2013 0450   PLT 213 02/03/2013 0450   MCV 89.0 02/03/2013 0450   LYMPHSABS 1.3 01/31/2013 1840   MONOABS 0.5 01/31/2013 1840   EOSABS 0.1 01/31/2013 1840   BASOSABS 0.0 01/31/2013 1840    CMP     Component Value Date/Time   NA 140 02/04/2013 0543   K 4.1 02/04/2013 0543   CL 108 02/04/2013 0543   CO2 21 02/04/2013 0543   GLUCOSE 88 02/04/2013 0543   BUN 10 02/04/2013 0543   CREATININE 0.69 02/04/2013 0543   CALCIUM 6.2* 02/04/2013 0543   PROT 7.1 02/03/2013 0450   ALBUMIN 2.5* 02/03/2013 0450   AST 73* 02/03/2013 0450   ALT 27 02/03/2013 0450   ALKPHOS 89 02/03/2013 0450   BILITOT 2.9* 02/03/2013 0450   GFRNONAA 82* 02/04/2013 0543   GFRAA >90 02/04/2013 0543  Abdominal U/S-  biliary sludge, no gallstones, no ductal problems, fatty liver  Assessment and Plan  Substance abuse Mild LFT and Bili elevation c/w; some agitation at hospital requiring ativan; hypokalemia c/w and with malnutrition;hypomagnesium c/w; hypocalcemia c/w;improved diet, supplements and MVI  Hypothyroid Stable;cont replacement  DM (diabetes mellitus) In past, apparently not currently ;HbA1c is 5.0; tendency to hypoglycemia so normal carb diet  Severe malnutrition Albumin 2.2-2.6; secondary to ETOH abuse, poor po intake; on marinol and supplements; weakness from same - working with PT  Thrombocytopenia Maybe in past -nl PLT on d/c    Margit Hanks, MD

## 2013-02-08 NOTE — Assessment & Plan Note (Signed)
Albumin 2.2-2.6; secondary to ETOH abuse, poor po intake; on marinol and supplements; weakness from same - working with PT

## 2013-02-08 NOTE — Assessment & Plan Note (Signed)
Stable;cont replacement

## 2013-02-08 NOTE — Assessment & Plan Note (Signed)
In past, apparently not currently ;HbA1c is 5.0; tendency to hypoglycemia so normal carb diet

## 2013-02-08 NOTE — Assessment & Plan Note (Signed)
Maybe in past -nl PLT on d/c

## 2013-02-08 NOTE — Op Note (Signed)
[facility] [address] [place], [place]  [id number]  ENDOSCOPY PROCEDURE REPORT  PATIENT: [name], [name] MR#: [id number] BIRTHDATE: 04-Jul-1933, 77 yrs. old GENDER: female  ENDOSCOPIST: Hedwig Morton. Juanda Chance, MD Referred by:  PROCEDURE DATE:  05/17/2011 PROCEDURE: EGD with biopsy, [id number] ASA CLASS: Class III INDICATIONS:     hematemesis, melena, anemia hx of active alcohol abuse, prior EGD by Dr Elnoria Howard within last year  MEDICATIONS:  There was residual sedation effect present from prior procedure. TOPICAL ANESTHETIC: none  DESCRIPTION OF PROCEDURE:   After the risks benefits and alternatives of the procedure were thoroughly explained, informed consent was obtained.  The Pentax Gastroscope [id number] endoscope was introduced through the mouth and advanced to the second portion of the duodenum, without limitations.  The instrument was slowly withdrawn as the mucosa was fully examined.   Esophagitis was found in the total esophagus (see image1, image2, image12, and image11). severe erosive esophagitis with exudate, No candida, NO VARICES  Severe gastritis was found in the body and the antrum of the stomach. Random biopsies were obtained and sent to pathology. With standard forceps, a biopsy was obtained and sent to pathology (see image9, image10, image7, image6, image4, and image3). marked erosive gastritis, erythema, exudate abd coffee ground material coating the stomach  Otherwise the examination was normal (see image5). no gastric varices,    Retroflexed views revealed no abnormalities.    The scope was then withdrawn from the patient and the procedure completed.  COMPLICATIONS: None  ENDOSCOPIC IMPRESSION: 1) Esophagitis in the total esophagus 2) Severe gastritis in the body and the antrum of the stomach  3) Otherwise normal examination NO VARICES,esoph or stomach NO active bleedinf, severe erosive gastritis ? alcohol? RECOMMENDATIONS: 1) Await  biopsy results replace NGT and start Carafate slurry, continue PPI, stop Octreotide  REPEAT EXAM: In 0 year(s) for.   _______________________________ Hedwig Morton. Juanda Chance, MD    CC:   n. eSIGNED:   Hedwig Morton. Brodie at 05/17/2011 03:53 PM      Juleen China, [phone]

## 2013-02-14 ENCOUNTER — Encounter: Payer: Self-pay | Admitting: Nurse Practitioner

## 2013-02-14 ENCOUNTER — Non-Acute Institutional Stay (SKILLED_NURSING_FACILITY): Payer: Medicare Other | Admitting: Nurse Practitioner

## 2013-02-14 DIAGNOSIS — R609 Edema, unspecified: Secondary | ICD-10-CM

## 2013-02-14 DIAGNOSIS — G9341 Metabolic encephalopathy: Secondary | ICD-10-CM

## 2013-02-14 DIAGNOSIS — K700 Alcoholic fatty liver: Secondary | ICD-10-CM

## 2013-02-14 DIAGNOSIS — E46 Unspecified protein-calorie malnutrition: Secondary | ICD-10-CM

## 2013-02-14 MED ORDER — FUROSEMIDE 20 MG PO TABS: 20.0000 mg | ORAL_TABLET | Freq: Every day | ORAL | Status: DC

## 2013-02-14 MED ORDER — SPIRONOLACTONE 25 MG PO TABS: 25.0000 mg | ORAL_TABLET | Freq: Every day | ORAL | Status: DC

## 2013-02-14 NOTE — Progress Notes (Signed)
Patient ID: [name], female   DOB: 1933/12/04, 79 y.o.   MRN: [id number]   PCP: Ginette Otto, MD   No Known Allergies  Chief Complaint  Patient presents with  . Acute Visit    HPI:  [name] is a 79 y.o. female, with a pmh of metabolic encephalopathy, alcohol abuse, mild Transaminitis with elevated bilirubin, COPD, hypothyroid, malnutrition, who is at [facility] for rehab after hospitalization due to acute encephalopathy and weakness.  pts daughter at bedside Pt is being seen today at the request of nursing due to worsening abdominal and LE edema. pts having regular BMs no abodminal discomfort or pain, [place] of abdomen showed mild ascites during hospitalization. Pts lower extremities have 3-4 edema bilaterally. Pt denies pain and there is no redness noted. No shortness of breath or chest pain noted. Pt recently with poor appetite but this has improved significantly per pts daughter.  Review of Systems:  Review of Systems  Constitutional: Negative for fever, chills and malaise/fatigue.  Respiratory: Negative for cough, sputum production and shortness of breath.   Cardiovascular: Positive for leg swelling. Negative for chest pain and palpitations.  Gastrointestinal: Negative for heartburn, nausea, vomiting, abdominal pain, diarrhea and constipation.  Genitourinary: Negative for dysuria, urgency and frequency.  Musculoskeletal: Negative for myalgias and joint pain.  Skin: Negative.   Neurological: Positive for dizziness and weakness. Negative for headaches.     Past Medical History  Diagnosis Date  . Abdominal pain   . Hypothyroid   . Diabetes mellitus   . COPD (chronic obstructive pulmonary disease)   . GERD (gastroesophageal reflux disease)   . Blood transfusion   . Hypertension   . Urinary tract infection   . Blood dyscrasia     HYPOKALEMIA  . Substance abuse     ETOH with dementia and prior encephalopathy and withdrawal   Past Surgical History   Procedure Laterality Date  . Abdominal hysterectomy    . Esophagogastroduodenoscopy  05/17/2011    Procedure: ESOPHAGOGASTRODUODENOSCOPY (EGD);  Surgeon: Hart Carwin, MD;  Location: [facility] ENDOSCOPY;  Service: Endoscopy;  Laterality: N/A;   Social History:   reports that she has been smoking Cigarettes.  She has a 15 pack-year smoking history. She has never used smokeless tobacco. She reports that  drinks alcohol. She reports that she does not use illicit drugs.  Family History  Problem Relation Age of Onset  . Stroke Mother     Medications: Patient's Medications  New Prescriptions   No medications on file  Previous Medications   ALBUTEROL (PROVENTIL) (5 MG/ML) 0.5% NEBULIZER SOLUTION    Take 0.5 mL (2.5 mg total) by nebulization every 4 (four) hours as needed for wheezing.   ESOMEPRAZOLE MAGNESIUM (NEXIUM PO)    Take 1 capsule by mouth daily.   FEEDING SUPPLEMENT (ENSURE) PUDG    Take 1 Container by mouth 3 (three) times daily between meals.   FEEDING SUPPLEMENT (RESOURCE BREEZE) LIQD    Take 1 Container by mouth daily at 12 noon.   LEVOTHYROXINE SODIUM PO    Take 1 tablet by mouth daily.   MULTIPLE VITAMIN (MULTIVITAMIN WITH MINERALS) TABS TABLET    Take 1 tablet by mouth daily.   POLYETHYLENE GLYCOL (MIRALAX / GLYCOLAX) PACKET    Take 17 g by mouth 2 (two) times daily as needed (constipation).  Modified Medications   No medications on file  Discontinued Medications   DRONABINOL (MARINOL) 2.5 MG CAPSULE    Take 1 capsule (2.5 mg total) by  mouth daily before lunch.   METOPROLOL TARTRATE (LOPRESSOR) 12.5 MG TABS TABLET    Take 0.5 tablets (12.5 mg total) by mouth 2 (two) times daily.     Physical Exam: Physical Exam  Constitutional: She is oriented to person, place, and time and well-developed, well-nourished, and in no distress. No distress.  HENT:  Head: Normocephalic.  Mouth/Throat: Oropharynx is clear and moist. No oropharyngeal exudate.  Cardiovascular: Normal rate,  regular rhythm and normal heart sounds.   Pulmonary/Chest: Effort normal and breath sounds normal.  Abdominal: Bowel sounds are normal. She exhibits distension. There is no tenderness.  Pt abdomen firm however pts daughter reports the abdomen is softer than it was 2 days prior; pt reports she does not feel like it is distended or firm  Musculoskeletal: She exhibits edema (3+ edema into thighs). She exhibits no tenderness.  Nontender, no redness to LE  Neurological: She is alert and oriented to person, place, and time.  Skin: Skin is warm and dry. She is not diaphoretic. No erythema.  Psychiatric: Affect normal.     Filed Vitals:   02/14/13 1743  BP: 132/70  Pulse: 86  Temp: 98.3 F (36.8 C)  Resp: 18      Labs reviewed: Basic Metabolic Panel:  Recent Labs  16/10/96 0440  02/01/13 0505 02/02/13 0547 02/03/13 0450 02/03/13 1603 02/04/13 0543  NA 139  < > 140 137 140 140 140  K 3.6  < > 2.8* 4.7 5.7* 4.5 4.1  CL 101  < > 97 101 104 108 108  CO2 28  < > 25 19 22 20 21   GLUCOSE 132*  < > 173* 250* 87 82 88  BUN 5*  < > 34* 21 15 13 10   CREATININE 0.55  < > 0.87 0.74 0.70 0.69 0.69  CALCIUM 7.9*  < > 5.5* 6.3* 6.6* 6.2* 6.2*  MG 1.3*  --  0.8* 1.6  --   --   --   < > = values in this interval not displayed. Liver Function Tests:  Recent Labs  02/01/13 0505 02/02/13 0547 02/03/13 0450  AST 51* 49* 73*  ALT 16 19 27   ALKPHOS 58 73 89  BILITOT 4.0* 3.2* 2.9*  PROT 5.9* 6.2 7.1  ALBUMIN 2.2* 2.2* 2.5*    Recent Labs  01/31/13 1840  LIPASE 7*   No results found for this basename: AMMONIA,  in the last 8760 hours CBC:  Recent Labs  07/18/12 1550  01/31/13 1840 01/31/13 2226 02/03/13 0450  WBC 5.9  < > 11.3* 12.7* 13.6*  NEUTROABS 3.2  --  9.3*  --   --   HGB 12.4  < > 10.5* 10.4* 10.9*  HCT 34.8*  < > 30.5* 29.8* 32.3*  MCV 95.3  < > 88.7 88.2 89.0  PLT 143*  < > 184 171 213  < > = values in this interval not displayed. Cardiac Enzymes:  Recent  Labs  07/18/12 1529  TROPONINI <0.30   BNP: No components found with this basename: POCBNP,  CBG:  Recent Labs  02/05/13 2156 02/06/13 0744 02/06/13 1151  GLUCAP 110* 107* 111*    Radiological Exams: US Abdomen Complete  02/01/2013   *RADIOLOGY REPORT*  Clinical Data:  Elevated liver function tests  COMPLETE ABDOMINAL ULTRASOUND  Comparison:  05/27/11  Findings:  Gallbladder: No gallstones are identified.  Gallbladder sludge is seen within a well distended gallbladder.  No wall thickening is noted.  Common bile duct:  3.8 mm and  within normal limits.  Liver:  Increased echogenicity is noted consistent with fatty infiltration. Nofocal mass lesion is noted.  IVC:  Appears normal.  Pancreas:  No focal abnormality seen.  Spleen:  4.0 cm.  Right Kidney:  11.0 cm.  No mass lesion or hydronephrosis is noted.  Left Kidney:  10.5 cm.  No mass lesion or hydronephrosis is noted.  Abdominal aorta:  No aneurysm identified.  A small amount of ascites is noted.  IMPRESSION: Gallbladder sludge without definitive stones.  Small amount of ascites.  Fatty liver.   Original Report Authenticated By: Alcide Clever, M.D.   Dg Chest Port 1 View  01/31/2013   *RADIOLOGY REPORT*  Clinical Data: Chest pain.  COPD.  Weakness.  Diabetes and hypertension.  CHEST - 1 VIEW  Comparison:  07/18/2012  Findings: The heart size and mediastinal contours are within normal limits.  Both lungs are clear.  IMPRESSION: No active disease.   Original Report Authenticated By: Myles Rosenthal, M.D.      Assessment/Plan 1. Encephalopathy, metabolic baseline  2. Unspecified protein-calorie malnutrition Daughter would like Marinol stopped at this time and have appetite to be monitored; if decrease in appetite she would like Remeron to be started   3. Edema In abdomen and LE- will start aldactone 25 mg daily and lasix 20 mg daily; thigh high ted hose to be worn during the day and follow up cmp in 1 week   4. Alcohol induced fatty  liver Will follow up liver enzymes  TIME PHYSICIAN WITH PATIENT: 60 mins Time TOTAL:  time greater than 50% of total time spent doing pt counseled and coordination of care regarding Updating daughter and pt on medication and plan of care

## 2013-02-22 ENCOUNTER — Encounter: Payer: Self-pay | Admitting: Internal Medicine

## 2013-02-23 ENCOUNTER — Non-Acute Institutional Stay (SKILLED_NURSING_FACILITY): Payer: Medicare Other | Admitting: Nurse Practitioner

## 2013-02-23 DIAGNOSIS — E43 Unspecified severe protein-calorie malnutrition: Secondary | ICD-10-CM

## 2013-02-23 DIAGNOSIS — E41 Nutritional marasmus: Secondary | ICD-10-CM

## 2013-02-23 DIAGNOSIS — E876 Hypokalemia: Secondary | ICD-10-CM

## 2013-02-23 DIAGNOSIS — R609 Edema, unspecified: Secondary | ICD-10-CM

## 2013-03-01 ENCOUNTER — Encounter: Payer: Self-pay | Admitting: Internal Medicine

## 2013-03-01 ENCOUNTER — Non-Acute Institutional Stay (SKILLED_NURSING_FACILITY): Payer: Medicare Other | Admitting: Internal Medicine

## 2013-03-01 DIAGNOSIS — I358 Other nonrheumatic aortic valve disorders: Secondary | ICD-10-CM | POA: Insufficient documentation

## 2013-03-01 DIAGNOSIS — F1910 Other psychoactive substance abuse, uncomplicated: Secondary | ICD-10-CM

## 2013-03-01 DIAGNOSIS — R531 Weakness: Secondary | ICD-10-CM

## 2013-03-01 DIAGNOSIS — R609 Edema, unspecified: Secondary | ICD-10-CM

## 2013-03-01 DIAGNOSIS — R5381 Other malaise: Secondary | ICD-10-CM

## 2013-03-01 DIAGNOSIS — D649 Anemia, unspecified: Secondary | ICD-10-CM

## 2013-03-01 DIAGNOSIS — I359 Nonrheumatic aortic valve disorder, unspecified: Secondary | ICD-10-CM

## 2013-03-01 DIAGNOSIS — E8779 Other fluid overload: Secondary | ICD-10-CM

## 2013-03-01 NOTE — Assessment & Plan Note (Signed)
Daughter raised the question of whether she should be on thiamine here, which sounds reasonable so i'm writing for thiamine 50 mg daily and folic acid 1 mg po daily also.

## 2013-03-01 NOTE — Assessment & Plan Note (Addendum)
Rechecking CBC to make sure it is stable; also hear heart murmur today - flow murmur vs other

## 2013-03-01 NOTE — Assessment & Plan Note (Addendum)
I do not see anywhere where one was documented, including my own notes; pt has had new onset edema , now resolved, felt to be alcoholic liver dx but may be it is more;will set up appt with outpt cardiology.

## 2013-03-01 NOTE — Progress Notes (Signed)
MRN: [id number] Name: [name]  Sex: female Age: 79 y.o. DOB: 1933/09/22  PSC #: [facility] Facility/Room: 120 Level Of Care: SNF Provider: Merrilee Seashore D Emergency Contacts: Extended Emergency Contact Information Primary Emergency Contact: Junius Finner [place] Home Phone: [phone] Mobile Phone: [phone] Relation: Daughter  Code Status: FULL  Allergies: Review of patient's allergies indicates no known allergies.  Chief Complaint  Patient presents with  . Acute Visit    HPI: Patient is 79 y.o. female whose daughter is here today for pt's review and she had some concerns.  Past Medical History  Diagnosis Date  . Abdominal pain   . Hypothyroid   . Diabetes mellitus   . COPD (chronic obstructive pulmonary disease)   . GERD (gastroesophageal reflux disease)   . Blood transfusion   . Hypertension   . Urinary tract infection   . Blood dyscrasia     HYPOKALEMIA  . Substance abuse     ETOH with dementia and prior encephalopathy and withdrawal    Past Surgical History  Procedure Laterality Date  . Abdominal hysterectomy    . Esophagogastroduodenoscopy  05/17/2011    Procedure: ESOPHAGOGASTRODUODENOSCOPY (EGD);  Surgeon: Hart Carwin, MD;  Location: [facility] ENDOSCOPY;  Service: Endoscopy;  Laterality: N/A;      Medication List       This list is accurate as of: 03/01/13  4:16 PM.  Always use your most recent med list.               albuterol (5 MG/ML) 0.5% nebulizer solution  Commonly known as:  PROVENTIL  Take 0.5 mL (2.5 mg total) by nebulization every 4 (four) hours as needed for wheezing.     feeding supplement Liqd  Take 1 Container by mouth daily at 12 noon.     feeding supplement Pudg  Take 1 Container by mouth 3 (three) times daily between meals.     LEVOTHYROXINE SODIUM PO  Take 50 mcg by mouth daily.     magnesium oxide 400 MG tablet  Commonly known as:  MAG-OX  Take 400 mg by mouth daily.      multivitamin with minerals Tabs tablet  Take 1 tablet by mouth daily.     NEXIUM PO  Take 1 capsule by mouth daily.     polyethylene glycol packet  Commonly known as:  MIRALAX / GLYCOLAX  Take 17 g by mouth 2 (two) times daily as needed (constipation).     spironolactone 25 MG tablet  Commonly known as:  ALDACTONE  Take 1 tablet (25 mg total) by mouth daily.        Meds ordered this encounter  Medications  . magnesium oxide (MAG-OX) 400 MG tablet    Sig: Take 400 mg by mouth daily.     There is no immunization history on file for this patient.  History  Substance Use Topics  . Smoking status: Current Every Day Smoker -- 0.50 packs/day for 30 years    Types: Cigarettes  . Smokeless tobacco: Never Used  . Alcohol Use: Yes     Comment: Uknown How much, daughter thinks she drinks heavy, 01/31/13 - "shots on a regular basis"    Family history is noncontributory    Review of Systems  DATA OBTAINED: from patient and her daughter PT had raised the issue that the pt was fearful at times to walk because she was afraid she was going to fall and  the question was was this a rational or irrational fear.Daughter is a pediatrician and just wants to know before we put her on any new medications. 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     Filed Vitals:   03/01/13 1536  BP: 128/68  Pulse: 68  Temp: 98.4 F (36.9 C)  Resp: 20    Physical Exam  GENERAL APPEARANCE: Alert, conversant. Appropriately groomed. No acute distress.  SKIN: No diaphoresis rash, or wounds HEAD: Normocephalic, atraumatic  EYES: Conjunctiva/lids clear. Pupils round, reactive. EOMs intact.  EARS: External exam WNL, canals clear. Hearing grossly normal.  NOSE: No deformity or discharge.  MOUTH/THROAT: Lips w/o lesions.  RESPIRATORY: Breathing is even, unlabored. Lung sounds are clear   CARDIOVASCULAR: Heart RRR  2/6 systolic murmur best heard LLSB,no rubs or gallops. No peripheral edema.   GASTROINTESTINAL:  Abdomen is soft, non-tender,mildly  distended w/ normal bowel sounds c/w alcolholic liver dx; do not appreciate shifting dullness;liver is enlarged GENITOURINARY: Bladder non tender, not distended  MUSCULOSKELETAL: No abnormal joints or musculature NEUROLOGIC: Oriented X3. Cranial nerves 2-12 grossly intact. Moves all extremities no tremor. PSYCHIATRIC: Mood and affect appropriate to situation, no behavioral issues  Patient Active Problem List   Diagnosis Date Noted  . Fluid retention 03/01/2013  . Aortic heart murmur on examination 03/01/2013  . Severe malnutrition 02/08/2013  . Substance abuse   . Encephalopathy, metabolic 02/03/2013  . Leukocytosis, unspecified 02/01/2013  . Alcohol dependence 07/19/2012  . Hypomagnesemia 07/19/2012  . Hypocalcemia 07/18/2012  . Alcohol abuse with intoxication 02/11/2012  . Altered mental state 01/06/2012  . Alcohol withdrawal 01/06/2012  . Weakness 01/04/2012  . UTI (lower urinary tract infection) 01/04/2012  . Thrombocytopenia 05/26/2011  . DM (diabetes mellitus) 05/26/2011  . Hypertriglyceridemia 05/26/2011  . Upper GI bleed 05/26/2011  . C. difficile colitis 05/25/2011  . Acute renal failure 05/25/2011  . Gastritis 05/25/2011  . Encephalopathy acute 05/25/2011  . Acute respiratory failure 05/25/2011  . Anemia 05/25/2011  . Alcohol induced fatty liver 05/25/2011  . Hypothyroid 05/25/2011  . Hypokalemia 05/25/2011  . Septic shock 05/25/2011      CBC    Component Value Date/Time   WBC 13.6* 02/03/2013 0450   RBC 3.63* 02/03/2013 0450   HGB 10.9* 02/03/2013 0450   HCT 32.3* 02/03/2013 0450   PLT 213 02/03/2013 0450   MCV 89.0 02/03/2013 0450   LYMPHSABS 1.3 01/31/2013 1840   MONOABS 0.5 01/31/2013 1840   EOSABS 0.1 01/31/2013 1840   BASOSABS 0.0 01/31/2013 1840    CMP     Component Value Date/Time   NA 140 02/04/2013 0543   K 4.1 02/04/2013 0543   CL 108 02/04/2013 0543   CO2 21 02/04/2013 0543   GLUCOSE 88 02/04/2013 0543   BUN 10  02/04/2013 0543   CREATININE 0.69 02/04/2013 0543   CALCIUM 6.2* 02/04/2013 0543   PROT 7.1 02/03/2013 0450   ALBUMIN 2.5* 02/03/2013 0450   AST 73* 02/03/2013 0450   ALT 27 02/03/2013 0450   ALKPHOS 89 02/03/2013 0450   BILITOT 2.9* 02/03/2013 0450   GFRNONAA 82* 02/04/2013 0543   GFRAA >90 02/04/2013 0543    Assessment and Plan  Weakness According to meeting with SNF today pt was VERY SLOWLY overcoming her fear. She did remark that her back felt like it wouldn't hold her so I've written for some emphasis on core. Before pt was in hospital she had gotten to where she was having to crawl she was so weak so I  don't think her fear is irrational. I also don't think as a consensus decision with her daughter that there is any particular medication that would improve that situation.  Substance abuse Daughter raised the question of whether she should be on thiamine here, which sounds reasonable so i'm writing for thiamine 50 mg daily and folic acid 1 mg po daily also.  Fluid retention Pt has recently had the problem if swelling in her legs and stomach and low K+ and low Mg+. A Swelling is resolved except baseline in abd so lasix and K was d/c, spironolactone was continued, will recheck K+ and Mg+  Anemia Rechecking CBC to make sure it is stable; also hear heart murmur today - flow murmur vs other  Aortic heart murmur on examination I do not see anywhere where one was documented, including my own notes; pt has had new onset edema , now resolved, felt to be alcoholic liver dx but may be it is more;will set up appt with outpt cardiology.    Margit Hanks, MD

## 2013-03-01 NOTE — Assessment & Plan Note (Signed)
According to meeting with SNF today pt was VERY SLOWLY overcoming her fear. She did remark that her back felt like it wouldn't hold her so I've written for some emphasis on core. Before pt was in hospital she had gotten to where she was having to crawl she was so weak so I don't think her fear is irrational. I also don't think as a consensus decision with her daughter that there is any particular medication that would improve that situation.

## 2013-03-01 NOTE — Assessment & Plan Note (Signed)
Pt has recently had the problem if swelling in her legs and stomach and low K+ and low Mg+. A Swelling is resolved except baseline in abd so lasix and K was d/c, spironolactone was continued, will recheck K+ and Mg+

## 2013-03-12 ENCOUNTER — Encounter: Payer: Self-pay | Admitting: Nurse Practitioner

## 2013-03-12 NOTE — Progress Notes (Signed)
Patient ID: [name], female   DOB: 1934/05/09, 79 y.o.   MRN: [id number]  Nursing Home Location:  [facility]   Place of Service: SNF (31)  Chief Complaint  Patient presents with  . Acute Visit    HPI:  [name] is a 79 y.o. female, with a pmh of metabolic encephalopathy, alcohol abuse, mild transaminitis with elevated bilirubin, COPD, hypothyroid, malnutrition, who is at [facility] for rehab after hospitalization due to acute encephalopathy and weakness.  pt is being seen today to follow up labs; edema and questions from daughter.  Pts had previously been seen due to  3-4 edema to bilateral lower extremities and was treated with diuretics; overall edema has improved (TED hose currently not being worn) pts daughter concerned due to spot on sacrum during hospitalization and would like this evaluated.  pts labs came back with a noted low potassium and mag level Pt denies shortness of breath, chest pains, palpations, numbness, tingling, lightheaded, dizziness, pain in legs or arms. Denies pain on spine or sacrum.   Review of Systems:  DATA OBTAINED: from patient, nurse, medical record, family member GENERAL: Feels well no fevers, fatigue, appetite changes SKIN: No itching, rash or wounds EYES: No eye pain, redness, discharge EARS: No earache, tinnitus, change in hearing NOSE: No congestion, drainage or bleeding  MOUTH/THROAT: No mouth or tooth pain, No sore throat, No difficulty chewing or swallowing  RESPIRATORY: No cough, wheezing, SOB CARDIAC: No chest pain, palpitations, has  lower extremity edema  GI: No abdominal pain, No N/V/D or constipation, No heartburn or reflux  GU: No dysuria, frequency or urgency. MUSCULOSKELETAL: No unrelieved bone/joint pain NEUROLOGIC: Awake, alert, appropriate to situation, No change in mental status. Moves all four, no focal deficits PSYCHIATRIC: No overt anxiety or sadness. Sleeps well. No behavior issue.  AMBULATION:   Uses WC    Medications: Patient's Medications  New Prescriptions   No medications on file  Previous Medications   ALBUTEROL (PROVENTIL) (5 MG/ML) 0.5% NEBULIZER SOLUTION    Take 0.5 mL (2.5 mg total) by nebulization every 4 (four) hours as needed for wheezing.   ESOMEPRAZOLE MAGNESIUM (NEXIUM PO)    Take 1 capsule by mouth daily.   FEEDING SUPPLEMENT (ENSURE) PUDG    Take 1 Container by mouth 3 (three) times daily between meals.   FEEDING SUPPLEMENT (RESOURCE BREEZE) LIQD    Take 1 Container by mouth daily at 12 noon.   LEVOTHYROXINE SODIUM PO    Take 50 mcg by mouth daily.    LASIX 20 MG PO    Take  20 mg by mouth daily.   MULTIPLE VITAMIN (MULTIVITAMIN WITH MINERALS) TABS TABLET    Take 1 tablet by mouth daily.   POLYETHYLENE GLYCOL (MIRALAX / GLYCOLAX) PACKET    Take 17 g by mouth 2 (two) times daily as needed (constipation).   SPIRONOLACTONE (ALDACTONE) 25 MG TABLET    Take 1 tablet (25 mg total) by mouth daily.  Modified Medications   No medications on file  Discontinued Medications   No medications on file     Physical Exam:  Filed Vitals:   02/23/13 1158  BP: 136/74  Pulse: 76  Temp: 97.9 F (36.6 C)  Resp: 20  SpO2: 98%    GENERAL APPEARANCE: Alert, conversant. Appropriately groomed. No acute distress.  SKIN: No diaphoresis rash, or wounds- skin intact on sacrum HEAD: Normocephalic, atraumatic  EYES: Conjunctiva/lids clear. Pupils round, reactive. EOMs intact.  EARS: External exam WNL, canals  clear. Hearing grossly normal.  NOSE: No deformity or discharge.  MOUTH/THROAT: Lips w/o lesions. Mouth and throat normal. Tongue moist, w/o lesion.  NECK: No thyroid tenderness, enlargement or nodule  RESPIRATORY: Breathing is even, unlabored. Lung sounds are clear   CARDIOVASCULAR: Heart RRR no murmurs, rubs or gallops. 1-2+ edema to bilateral extermities.  ARTERIAL: radial pulse 2+, DP pulse 1+  GASTROINTESTINAL: Abdomen is soft, non-tender, not distended w/ normal bowel  sounds. GENITOURINARY: Bladder non tender, not distended  MUSCULOSKELETAL: No abnormal joints or musculature NEUROLOGIC: Oriented X3. Cranial nerves 2-12 grossly intact. Moves all extremities no tremor. PSYCHIATRIC: Mood and affect appropriate to situation, no behavioral issues  Labs reviewed/Significant Diagnostic Results:  Basic Metabolic Panel       Result: 02/23/2013 4:18 AM    ( Status: F )            Sodium  144        135-145  mEq/L  SLN       Potassium  2.9     L  3.5-5.3  mEq/L  SLN       Chloride  105        96-112  mEq/L  SLN       CO2  27        19-32  mEq/L  SLN       Glucose  150     H  70-99  mg/dL  SLN       BUN  10        6-23  mg/dL  SLN       Creatinine  0.44     L  0.50-1.10  mg/dL  SLN       Calcium  8.0     L  8.4-10.5  mg/dL  SLN      Magnesium       Result: 02/23/2013 4:18 AM    ( Status: F )            Magnesium  1.2     L  1.5-2.5  mg/dL  SLN    Assessment/Plan 1. Edema Improved; will cont lasix and aldactone  2. Hypokalemia Will give KCL 40 now and repeat in 4 hours; will schedule KCL 40 meq daily and follow up BMP in AM  3. Hypomagnesemia Will start magox 400 mg daily  4. Severe malnutrition conts to follow with RD; pt reports good appetite.   TIME WITH PATIENT: 60 mins Time TOTAL:  time greater than 50% of total time spent doing pt counseled and coordination of care regarding complex medical management of chronic illness; pts daughter updated regarding plan of care; labs and findings on exam

## 2013-03-23 ENCOUNTER — Non-Acute Institutional Stay (SKILLED_NURSING_FACILITY): Payer: Medicare Other | Admitting: Nurse Practitioner

## 2013-03-23 ENCOUNTER — Encounter: Payer: Self-pay | Admitting: Nurse Practitioner

## 2013-03-23 DIAGNOSIS — E41 Nutritional marasmus: Secondary | ICD-10-CM

## 2013-03-23 DIAGNOSIS — E8779 Other fluid overload: Secondary | ICD-10-CM

## 2013-03-23 DIAGNOSIS — E43 Unspecified severe protein-calorie malnutrition: Secondary | ICD-10-CM

## 2013-03-23 DIAGNOSIS — R609 Edema, unspecified: Secondary | ICD-10-CM

## 2013-03-23 DIAGNOSIS — D649 Anemia, unspecified: Secondary | ICD-10-CM

## 2013-03-23 DIAGNOSIS — E039 Hypothyroidism, unspecified: Secondary | ICD-10-CM

## 2013-03-23 NOTE — Progress Notes (Signed)
Patient ID: [name], female   DOB: 09-Feb-1934, 79 y.o.   MRN: [id number]    No Known Allergies  Chief Complaint  Patient presents with  . Medical Managment of Chronic Issues    HPI:  [name] is a 79 y.o. female, with a pmh of metabolic encephalopathy, alcohol abuse, mild transaminitis with elevated bilirubin, COPD, hypothyroid, malnutrition, who is at [facility] for rehab after hospitalization due to acute encephalopathy and weakness. pt is being seen today for routine follow-up.  Pts had previously been seen due to 3-4 edema to bilateral lower extremities which has now resolved; pt currently taking aldactone  Pt currently without complaints and nursing without concerns   Review of Systems:  Review of Systems  Constitutional: Negative for fever, chills and malaise/fatigue.  Respiratory: Negative for cough, sputum production and shortness of breath.   Cardiovascular: Negative for chest pain, palpitations and leg swelling.  Gastrointestinal: Negative for heartburn, nausea, vomiting, abdominal pain, diarrhea and constipation.  Genitourinary: Negative for dysuria, urgency and frequency.  Musculoskeletal: Negative for joint pain and myalgias.  Skin: Negative.   Neurological: Positive for weakness. Negative for dizziness and headaches.  Psychiatric/Behavioral: Negative for depression. The patient is not nervous/anxious and does not have insomnia.      Past Medical History  Diagnosis Date  . Abdominal pain   . Hypothyroid   . Diabetes mellitus   . COPD (chronic obstructive pulmonary disease)   . GERD (gastroesophageal reflux disease)   . Blood transfusion   . Hypertension   . Urinary tract infection   . Blood dyscrasia     HYPOKALEMIA  . Substance abuse     ETOH with dementia and prior encephalopathy and withdrawal   Past Surgical History  Procedure Laterality Date  . Abdominal hysterectomy    . Esophagogastroduodenoscopy  05/17/2011    Procedure:  ESOPHAGOGASTRODUODENOSCOPY (EGD);  Surgeon: Hart Carwin, MD;  Location: [facility] ENDOSCOPY;  Service: Endoscopy;  Laterality: N/A;   Social History:   reports that she has been smoking Cigarettes.  She has a 15 pack-year smoking history. She has never used smokeless tobacco. She reports that  drinks alcohol. She reports that she does not use illicit drugs.  Family History  Problem Relation Age of Onset  . Stroke Mother     Medications: Patient's Medications  New Prescriptions   No medications on file  Previous Medications   ALBUTEROL (PROVENTIL) (5 MG/ML) 0.5% NEBULIZER SOLUTION    Take 0.5 mL (2.5 mg total) by nebulization every 4 (four) hours as needed for wheezing.   ESOMEPRAZOLE MAGNESIUM (NEXIUM PO)    Take 1 capsule by mouth daily.   FEEDING SUPPLEMENT (ENSURE) PUDG    Take 1 Container by mouth 3 (three) times daily between meals.   FEEDING SUPPLEMENT (RESOURCE BREEZE) LIQD    Take 1 Container by mouth daily at 12 noon.   LEVOTHYROXINE SODIUM PO    Take 50 mcg by mouth daily.    MAGNESIUM OXIDE (MAG-OX) 400 MG TABLET    Take 400 mg by mouth daily.   MULTIPLE VITAMIN (MULTIVITAMIN WITH MINERALS) TABS TABLET    Take 1 tablet by mouth daily.   POLYETHYLENE GLYCOL (MIRALAX / GLYCOLAX) PACKET    Take 17 g by mouth 2 (two) times daily as needed (constipation).   SPIRONOLACTONE (ALDACTONE) 25 MG TABLET    Take 1 tablet (25 mg total) by mouth daily.  Modified Medications   No medications on file  Discontinued Medications   No medications  on file     Physical Exam:  Filed Vitals:   03/23/13 1937  BP: 135/71  Pulse: 88  Temp: 97.8 F (36.6 C)  Resp: 18  Weight: 98 lb (44.453 kg)   GENERAL APPEARANCE: Alert, conversant. Appropriately groomed. No acute distress.  SKIN: No diaphoresis rash, or wounds HEENT: unremarkable RESPIRATORY: Breathing is even, unlabored. Lung sounds are clear  CARDIOVASCULAR: Heart RRR no murmurs, rubs or gallops. No edema to bilateral extermities.   ARTERIAL: radial pulse 2+, DP pulse 1+  GASTROINTESTINAL: Abdomen is soft, non-tender, not distended w/ normal bowel sounds.  GENITOURINARY: Bladder non tender, not distended  MUSCULOSKELETAL: No abnormal joints or musculature  NEUROLOGIC: Oriented X3. Cranial nerves 2-12 grossly intact. Moves all extremities no tremor.  PSYCHIATRIC: Mood and affect appropriate to situation, no behavioral issues    Labs reviewed: Basic Metabolic Panel:  Recent Labs  16/10/96 0440  02/01/13 0505 02/02/13 0547 02/03/13 0450 02/03/13 1603 02/04/13 0543  NA 139  < > 140 137 140 140 140  K 3.6  < > 2.8* 4.7 5.7* 4.5 4.1  CL 101  < > 97 101 104 108 108  CO2 28  < > 25 19 22 20 21   GLUCOSE 132*  < > 173* 250* 87 82 88  BUN 5*  < > 34* 21 15 13 10   CREATININE 0.55  < > 0.87 0.74 0.70 0.69 0.69  CALCIUM 7.9*  < > 5.5* 6.3* 6.6* 6.2* 6.2*  MG 1.3*  --  0.8* 1.6  --   --   --   < > = values in this interval not displayed. Liver Function Tests:  Recent Labs  02/01/13 0505 02/02/13 0547 02/03/13 0450  AST 51* 49* 73*  ALT 16 19 27   ALKPHOS 58 73 89  BILITOT 4.0* 3.2* 2.9*  PROT 5.9* 6.2 7.1  ALBUMIN 2.2* 2.2* 2.5*    Recent Labs  01/31/13 1840  LIPASE 7*   No results found for this basename: AMMONIA,  in the last 8760 hours CBC:  Recent Labs  07/18/12 1550  01/31/13 1840 01/31/13 2226 02/03/13 0450  WBC 5.9  < > 11.3* 12.7* 13.6*  NEUTROABS 3.2  --  9.3*  --   --   HGB 12.4  < > 10.5* 10.4* 10.9*  HCT 34.8*  < > 30.5* 29.8* 32.3*  MCV 95.3  < > 88.7 88.2 89.0  PLT 143*  < > 184 171 213  < > = values in this interval not displayed. Cardiac Enzymes:  Recent Labs  07/18/12 1529  TROPONINI <0.30   BNP: No components found with this basename: POCBNP,  CBG:  Recent Labs  02/05/13 2156 02/06/13 0744 02/06/13 1151  GLUCAP 110* 107* 111*    Basic Metabolic Panel  Result: 02/23/2013 4:18 AM ( Status: F )  Sodium 144 135-145 mEq/L SLN  Potassium 2.9 L 3.5-5.3 mEq/L SLN   Chloride 105 96-112 mEq/L SLN  CO2 27 19-32 mEq/L SLN  Glucose 150 H 70-99 mg/dL SLN  BUN 10 0-45 mg/dL SLN  Creatinine 4.09 L 0.50-1.10 mg/dL SLN  Calcium 8.0 L 8.1-19.1 mg/dL SLN  Magnesium  Result: 02/23/2013 4:18 AM ( Status: F )  Magnesium 1.2 L 1.5-2.5 mg/dL SLN    Assessment/Plan 1. Hypothyroid Will follow up TSH  2. Anemia Will get CBC  3. Severe malnutrition Pt eating well per nursing; weight at 98 lbs however without edema at this time; will cont to monitor   4. Fluid retention Resolved; pt  currently without edeam

## 2013-03-24 ENCOUNTER — Ambulatory Visit (INDEPENDENT_AMBULATORY_CARE_PROVIDER_SITE_OTHER): Payer: Medicare Other | Admitting: Cardiology

## 2013-03-24 ENCOUNTER — Encounter: Payer: Self-pay | Admitting: Cardiology

## 2013-03-24 VITALS — BP 142/77 | Ht 62.0 in | Wt 102.0 lb

## 2013-03-24 DIAGNOSIS — R011 Cardiac murmur, unspecified: Secondary | ICD-10-CM

## 2013-03-24 NOTE — Patient Instructions (Addendum)
Your physician recommends that you continue on your current medications as directed. Please refer to the Current Medication list given to you today.  Your physician recommends that you follow-up as needed.  

## 2013-03-24 NOTE — Progress Notes (Signed)
[address] [place], [place]  [address] Phone: [phone] Fax:  [phone]  Date:  03/24/2013   ID:  [name], [name] [birth year], MRN [id number]  PCP:  Ginette Otto, MD   History of Present Illness: [name] is a 79 y.o. female with recent hospitalization in August of 2014 with metabolic encephalopathy, urinary tract infection, alcohol abuse, severe malnutrition here at the request of Dr. Chilton Si from [place] [facility] rehabilitation center for the evaluation of heart murmur. Records have been reviewed and last echocardiogram in 2012 did not demonstrate any significant valvular abnormalities. She has not had any significant shortness of breath, no fevers, no chills, no orthopnea.   Wt Readings from Last 3 Encounters:  03/24/13 102 lb (46.267 kg)  03/23/13 98 lb (44.453 kg)  02/06/13 141 lb 8.6 oz (64.2 kg)     Past Medical History  Diagnosis Date  . Abdominal pain   . Hypothyroid   . Diabetes mellitus   . COPD (chronic obstructive pulmonary disease)   . GERD (gastroesophageal reflux disease)   . Blood transfusion   . Hypertension   . Urinary tract infection   . Blood dyscrasia     HYPOKALEMIA  . Substance abuse     ETOH with dementia and prior encephalopathy and withdrawal    Past Surgical History  Procedure Laterality Date  . Abdominal hysterectomy    . Esophagogastroduodenoscopy  05/17/2011    Procedure: ESOPHAGOGASTRODUODENOSCOPY (EGD);  Surgeon: Hart Carwin, MD;  Location: [facility] ENDOSCOPY;  Service: Endoscopy;  Laterality: N/A;    Current Outpatient Prescriptions  Medication Sig Dispense Refill  . albuterol (PROVENTIL) (5 MG/ML) 0.5% nebulizer solution Take 0.5 mL (2.5 mg total) by nebulization every 4 (four) hours as needed for wheezing.  20 mL  12  . Esomeprazole Magnesium (NEXIUM PO) Take 1 capsule by mouth daily.      . feeding supplement (ENSURE) PUDG Take 1 Container by mouth 3 (three) times daily between  meals.    0  . folic acid (FOLVITE) 1 MG tablet Take 1 mg by mouth daily.      Marland Kitchen LEVOTHYROXINE SODIUM PO Take 50 mcg by mouth daily.       . magnesium oxide (MAG-OX) 400 MG tablet Take 400 mg by mouth daily.      . Multiple Vitamin (MULTIVITAMIN WITH MINERALS) TABS tablet Take 1 tablet by mouth daily.      . polyethylene glycol (MIRALAX / GLYCOLAX) packet Take 17 g by mouth 2 (two) times daily as needed (constipation).  14 each  0  . spironolactone (ALDACTONE) 25 MG tablet Take 1 tablet (25 mg total) by mouth daily.  60 tablet  0  . thiamine 50 MG tablet Take 50 mg by mouth daily.       No current facility-administered medications for this visit.    Allergies:   No Known Allergies  Social History:  The patient  reports that she has been smoking Cigarettes.  She has a 15 pack-year smoking history. She has never used smokeless tobacco. She reports that  drinks alcohol. She reports that she does not use illicit drugs.   Family History  Problem Relation Age of Onset  . Stroke Mother     ROS:  Please see the history of present illness.   Positive for weakness, prior encephalopathy, urinary tract infection. Denies any chest pain, syncope, orthopnea, PND.  All other systems reviewed and negative.   PHYSICAL  EXAM: VS:  BP 142/77  Ht 5\' 2"  (1.575 m)  Wt 102 lb (46.267 kg)  BMI 18.65 kg/m2  LMP 05/17/1983 Thin, elderly, in wheelchair, in no acute distress HEENT: normal, [place]/AT, EOMI Neck: no JVD, normal carotid upstroke, no bruit Cardiac:  normal S1, S2; RRR; 2/6 holosystolic murmur, left lower sternal border Lungs:  clear to auscultation bilaterally, no wheezing, rhonchi or rales Abd: soft, nontender, no hepatomegaly, no bruits Ext: no edema, 2+ distal pulses Skin: warm and dry GU: deferred Neuro: no focal abnormalities noted, AAO x 3  EKG:  Sinus rhythm rate 85 with right atrial enlargement  BMET    Component Value Date/Time   NA 140 02/04/2013 0543   K 4.1 02/04/2013 0543   CL  108 02/04/2013 0543   CO2 21 02/04/2013 0543   GLUCOSE 88 02/04/2013 0543   BUN 10 02/04/2013 0543   CREATININE 0.69 02/04/2013 0543   CALCIUM 6.2* 02/04/2013 0543   GFRNONAA 82* 02/04/2013 0543   GFRAA >90 02/04/2013 0543    CBC    Component Value Date/Time   WBC 13.6* 02/03/2013 0450   RBC 3.63* 02/03/2013 0450   HGB 10.9* 02/03/2013 0450   HCT 32.3* 02/03/2013 0450   PLT 213 02/03/2013 0450   MCV 89.0 02/03/2013 0450   MCH 30.0 02/03/2013 0450   MCHC 33.7 02/03/2013 0450   RDW 19.5* 02/03/2013 0450   LYMPHSABS 1.3 01/31/2013 1840   MONOABS 0.5 01/31/2013 1840   EOSABS 0.1 01/31/2013 1840   BASOSABS 0.0 01/31/2013 1840      ASSESSMENT AND PLAN:  79 year old female with multiple medical conditions here for the evaluation of heart murmur  1. Heart murmur- Last echocardiogram in 2012 was unremarkable. Normal ejection fraction and normal valvular appearance at that time. Murmur appears to be either mitral or tricuspid regurgitation of mild degree. No significant symptoms at this point. I discussed with her and her family and we've decided to continue to monitor clinically. If symptoms change, echocardiogram can be repeated. 2. Malnutrition-encourage Ensure, diet. 3. We will followup as needed.   Signed, Donato Schultz, MD [facility]  03/24/2013 2:24 PM

## 2013-04-17 ENCOUNTER — Non-Acute Institutional Stay (SKILLED_NURSING_FACILITY): Payer: Medicare Other | Admitting: Nurse Practitioner

## 2013-04-17 DIAGNOSIS — K219 Gastro-esophageal reflux disease without esophagitis: Secondary | ICD-10-CM

## 2013-04-17 DIAGNOSIS — E43 Unspecified severe protein-calorie malnutrition: Secondary | ICD-10-CM

## 2013-04-17 DIAGNOSIS — D649 Anemia, unspecified: Secondary | ICD-10-CM

## 2013-04-17 DIAGNOSIS — E781 Pure hyperglyceridemia: Secondary | ICD-10-CM

## 2013-04-17 DIAGNOSIS — E41 Nutritional marasmus: Secondary | ICD-10-CM

## 2013-04-17 DIAGNOSIS — E039 Hypothyroidism, unspecified: Secondary | ICD-10-CM

## 2013-04-17 NOTE — Progress Notes (Signed)
Patient ID: [name], female   DOB: 11/25/1933, 79 y.o.   MRN: [id number]   PCP: Ginette Otto, MD  No Known Allergies  Chief Complaint  Patient presents with  . Discharge Note    HPI:  [name] is a 79 y.o. female, with a pmh of metabolic encephalopathy, alcohol abuse, mild transaminitis with elevated bilirubin, COPD, hypothyroid, malnutrition, who is at [facility] for rehab after hospitalization due to acute encephalopathy and weakness. Pt with recent psych consult who stated she can not make decisions for her own well -being therefore daughter is transferring pt to skill facility closer to her   pt without complaints and nursing does not have concerns  Review of Systems:  Review of Systems  Constitutional: Negative for fever and malaise/fatigue.  Respiratory: Negative for cough and shortness of breath.   Cardiovascular: Negative for chest pain and leg swelling.  Gastrointestinal: Negative for heartburn, nausea, vomiting, abdominal pain, diarrhea and constipation.  Genitourinary: Negative for dysuria, urgency and frequency.  Musculoskeletal: Negative for joint pain and myalgias.  Skin: Negative.   Neurological: Positive for weakness (uses WC ). Negative for dizziness and headaches.  Psychiatric/Behavioral: Negative for depression. The patient is not nervous/anxious and does not have insomnia.      Past Medical History  Diagnosis Date  . Abdominal pain   . Hypothyroid   . Diabetes mellitus   . COPD (chronic obstructive pulmonary disease)   . GERD (gastroesophageal reflux disease)   . Blood transfusion   . Hypertension   . Urinary tract infection   . Blood dyscrasia     HYPOKALEMIA  . Substance abuse     ETOH with dementia and prior encephalopathy and withdrawal   Past Surgical History  Procedure Laterality Date  . Abdominal hysterectomy    . Esophagogastroduodenoscopy  05/17/2011    Procedure: ESOPHAGOGASTRODUODENOSCOPY (EGD);  Surgeon: Hart Carwin, MD;  Location: [facility] ENDOSCOPY;  Service: Endoscopy;  Laterality: N/A;   Social History:   reports that she has been smoking Cigarettes.  She has a 15 pack-year smoking history. She has never used smokeless tobacco. She reports that she drinks alcohol. She reports that she does not use illicit drugs.  Family History  Problem Relation Age of Onset  . Stroke Mother     Medications: Patient's Medications  New Prescriptions   No medications on file  Previous Medications   ALBUTEROL (PROVENTIL) (5 MG/ML) 0.5% NEBULIZER SOLUTION    Take 0.5 mL (2.5 mg total) by nebulization every 4 (four) hours as needed for wheezing.   ESOMEPRAZOLE MAGNESIUM (NEXIUM PO)    Take 1 capsule by mouth daily.   FEEDING SUPPLEMENT (ENSURE) PUDG    Take 1 Container by mouth 3 (three) times daily between meals.   FOLIC ACID (FOLVITE) 1 MG TABLET    Take 1 mg by mouth daily.   LEVOTHYROXINE SODIUM PO    Take 50 mcg by mouth daily.    MAGNESIUM OXIDE (MAG-OX) 400 MG TABLET    Take 400 mg by mouth daily.   MULTIPLE VITAMIN (MULTIVITAMIN WITH MINERALS) TABS TABLET    Take 1 tablet by mouth daily.   POLYETHYLENE GLYCOL (MIRALAX / GLYCOLAX) PACKET    Take 17 g by mouth 2 (two) times daily as needed (constipation).   SPIRONOLACTONE (ALDACTONE) 25 MG TABLET    Take 1 tablet (25 mg total) by mouth daily.   THIAMINE 50 MG TABLET    Take 50 mg by mouth daily.  Modified Medications  No medications on file  Discontinued Medications   No medications on file     Physical Exam: Physical Exam  Constitutional: No distress.  Thin female  HENT:  Head: Normocephalic and atraumatic.  Nose: Nose normal.  Mouth/Throat: Oropharynx is clear and moist. No oropharyngeal exudate.  Eyes: Conjunctivae and EOM are normal. Pupils are equal, round, and reactive to light.  Neck: Normal range of motion. Neck supple. No thyromegaly present.  Cardiovascular: Normal rate, regular rhythm, normal heart sounds and intact distal pulses.    Pulmonary/Chest: Effort normal and breath sounds normal. No respiratory distress.  Abdominal: Soft. Bowel sounds are normal. She exhibits distension (but soft). There is no tenderness.  Musculoskeletal: She exhibits no edema and no tenderness.  Lymphadenopathy:    She has no cervical adenopathy.  Neurological: She is alert.  Skin: Skin is warm and dry. She is not diaphoretic.    Filed Vitals:   04/17/13 1412  BP: 121/71  Pulse: 96  Temp: 97.4 F (36.3 C)  Resp: 20      Labs reviewed: Basic Metabolic Panel:  Recent Labs  16/10/96 0440  02/01/13 0505 02/02/13 0547 02/03/13 0450 02/03/13 1603 02/04/13 0543  NA 139  < > 140 137 140 140 140  K 3.6  < > 2.8* 4.7 5.7* 4.5 4.1  CL 101  < > 97 101 104 108 108  CO2 28  < > 25 19 22 20 21   GLUCOSE 132*  < > 173* 250* 87 82 88  BUN 5*  < > 34* 21 15 13 10   CREATININE 0.55  < > 0.87 0.74 0.70 0.69 0.69  CALCIUM 7.9*  < > 5.5* 6.3* 6.6* 6.2* 6.2*  MG 1.3*  --  0.8* 1.6  --   --   --   < > = values in this interval not displayed. Liver Function Tests:  Recent Labs  02/01/13 0505 02/02/13 0547 02/03/13 0450  AST 51* 49* 73*  ALT 16 19 27   ALKPHOS 58 73 89  BILITOT 4.0* 3.2* 2.9*  PROT 5.9* 6.2 7.1  ALBUMIN 2.2* 2.2* 2.5*    Recent Labs  01/31/13 1840  LIPASE 7*   No results found for this basename: AMMONIA,  in the last 8760 hours CBC:  Recent Labs  07/18/12 1550  01/31/13 1840 01/31/13 2226 02/03/13 0450  WBC 5.9  < > 11.3* 12.7* 13.6*  NEUTROABS 3.2  --  9.3*  --   --   HGB 12.4  < > 10.5* 10.4* 10.9*  HCT 34.8*  < > 30.5* 29.8* 32.3*  MCV 95.3  < > 88.7 88.2 89.0  PLT 143*  < > 184 171 213  < > = values in this interval not displayed. Cardiac Enzymes:  Recent Labs  07/18/12 1529  TROPONINI <0.30   BNP: No components found with this basename: POCBNP,  CBG:  Recent Labs  02/05/13 2156 02/06/13 0744 02/06/13 1151  GLUCAP 110* 107* 111*    Result: 02/23/2013 4:18 AM ( Status: F )  Sodium  144 135-145 mEq/L SLN  Potassium 2.9 L 3.5-5.3 mEq/L SLN  Chloride 105 96-112 mEq/L SLN  CO2 27 19-32 mEq/L SLN  Glucose 150 H 70-99 mg/dL SLN  BUN 10 0-45 mg/dL SLN  Creatinine 4.09 L 0.50-1.10 mg/dL SLN  Calcium 8.0 L 8.1-19.1 mg/dL SLN  Magnesium  Result: 02/23/2013 4:18 AM ( Status: F )  Magnesium 1.2 L 1.5-2.5 mg/dL SLN  CBC NO Diff (Complete Blood Count)  Result: 03/24/2013 3:25 PM    ( Status: F )            WBC  10.2        4.0-10.5  K/uL  SLN       RBC  3.60     L  3.87-5.11  MIL/uL  SLN       Hemoglobin  10.9     L  12.0-15.0  g/dL  SLN       Hematocrit  33.1     L  36.0-46.0  %  SLN       MCV  91.9        78.0-100.0  fL  SLN       MCH  30.3        26.0-34.0  pg  SLN       MCHC  32.9        30.0-36.0  g/dL  SLN       RDW  16.1     H  11.5-15.5  %  SLN       Platelet Count  169        150-400  K/uL  SLN      Comprehensive Metabolic Panel       Result: 03/24/2013 3:10 PM    ( Status: F )            Sodium  140        135-145  mEq/L  SLN       Potassium  3.6        3.5-5.3  mEq/L  SLN       Chloride  107        96-112  mEq/L  SLN       CO2  25        19-32  mEq/L  SLN       Glucose  138     H  70-99  mg/dL  SLN       BUN  20        6-23  mg/dL  SLN       Creatinine  0.57        0.50-1.10  mg/dL  SLN       Bilirubin, Total  0.5        0.3-1.2  mg/dL  SLN       Alkaline Phosphatase  107        39-117  U/L  SLN       AST/SGOT  34        0-37  U/L  SLN       ALT/SGPT  17        0-35  U/L  SLN       Total Protein  6.7        6.0-8.3  g/dL  SLN       Albumin  3.3     L  3.5-5.2  g/dL  SLN       Calcium  9.6        8.4-10.5  mg/dL  SLN      TSH, Ultrasensitive       Result: 03/24/2013 3:42 PM    ( Status: F )            TSH  6.910     Assessment/Plan 1. Hypothyroid Will cont current dose of synthroid will need ongoing follow up   2. Anemia Stable at this time; no signs of bleeding  3. Weakness Ongoing will  need assistance; transferring to SNF at this time  4.  Severe malnutrition Hx of ETOH abuse; on supplements overall with weight gain since admission  5. GERD (gastroesophageal reflux disease) Patient is stable; continue current regimen. Will monitor and make changes as necessary.  6. Memory loss MMSE of 19/30 per psych; unable to make decisions for her own well being; daughter transferring pt to SNF in [place] which is closer to her home   Pt is stable for DC to SNF; Rx and other orders sent to SNF through social worker; she will also send lab work

## 2013-04-17 NOTE — Progress Notes (Signed)
Patient ID: [name], female   DOB: 31-Mar-1934, 79 y.o.   MRN: [id number] We (nursing home and myself) discharged pt under the understanding she was going to a skilled nursing home; daughter took pt and then decided to take pt home with her instead of taking her to SNF; once pt and daughter left the building she called and stated she needed medications and she was taking her mother home with her.     This is a problem due to the fact that pt does not have appropriate follow up.  There has been no home health set up or PCP visit schedule. Rx called in so pt will have prescriptions.

## 2013-06-17 ENCOUNTER — Other Ambulatory Visit: Payer: Self-pay | Admitting: Internal Medicine

## 2016-04-16 ENCOUNTER — Other Ambulatory Visit: Payer: Self-pay | Admitting: Otolaryngology

## 2016-04-16 DIAGNOSIS — K140 Glossitis: Secondary | ICD-10-CM

## 2016-04-22 ENCOUNTER — Ambulatory Visit
Admission: RE | Admit: 2016-04-22 | Discharge: 2016-04-22 | Disposition: A | Discharge status: Home / Self Care | Payer: Medicare Other | Source: Ambulatory Visit | Attending: Otolaryngology | Admitting: Otolaryngology

## 2016-04-22 DIAGNOSIS — K140 Glossitis: Secondary | ICD-10-CM

## 2016-04-22 MED ORDER — IOPAMIDOL (ISOVUE-300) INJECTION 61%
75.0000 mL | Freq: Once | INTRAVENOUS | Status: AC | PRN
  Administered 2016-04-22: 75 mL via INTRAVENOUS

## 2016-05-06 ENCOUNTER — Other Ambulatory Visit: Payer: Self-pay | Admitting: Otolaryngology

## 2016-05-12 ENCOUNTER — Encounter (HOSPITAL_COMMUNITY): Payer: Self-pay | Admitting: *Deleted

## 2016-05-12 NOTE — Anesthesia Preprocedure Evaluation (Addendum)
Anesthesia Evaluation  Patient identified by MRN, date of birth, ID band Patient awake    Reviewed: Allergy & Precautions, NPO status , Patient's Chart, lab work & pertinent test results  History of Anesthesia Complications Negative for: history of anesthetic complications  Airway Mallampati: III  TM Distance: >3 FB Neck ROM: Limited    Dental  (+) Missing, Edentulous Upper,    Pulmonary neg shortness of breath, neg sleep apnea, COPD, neg recent URI, Current Smoker,    Pulmonary exam normal breath sounds clear to auscultation- rhonchi       Cardiovascular hypertension, Pt. on medications (-) angina(-) Past MI, (-) Cardiac Stents, (-) CABG, (-) Orthopnea, (-) PND and (-) DOE (-) dysrhythmias + Valvular Problems/Murmurs (mild TR)  Rhythm:Regular Rate:Normal     Neuro/Psych neg Seizures negative neurological ROS     GI/Hepatic neg GERD  ,(+)     substance abuse  alcohol use, Alcohol-induced fatty liver, h/o encephalopathy and withdrawal   Endo/Other  diabetes, Type 2, Oral Hypoglycemic AgentsHypothyroidism   Renal/GU      Musculoskeletal   Abdominal   Peds  Hematology  (+) Blood dyscrasia, anemia ,   Anesthesia Other Findings Tongue cancer, neuropathy   Reproductive/Obstetrics                            Anesthesia Physical Anesthesia Plan  ASA: III  Anesthesia Plan: General   Post-op Pain Management:    Induction: Intravenous  Airway Management Planned: Oral ETT  Additional Equipment:   Intra-op Plan:   Post-operative Plan: Extubation in OR  Informed Consent: I have reviewed the patients History and Physical, chart, labs and discussed the procedure including the risks, benefits and alternatives for the proposed anesthesia with the patient or authorized representative who has indicated his/her understanding and acceptance.   Dental advisory given  Plan Discussed with:  CRNA  Anesthesia Plan Comments: (Risks of general anesthesia discussed including, but not limited to, sore throat, hoarse voice, chipped/damaged teeth, injury to vocal cords, nausea and vomiting, allergic reactions, lung infection, heart attack, stroke, and death. All questions answered.  Labs on DOS)       Anesthesia Quick Evaluation

## 2016-05-12 NOTE — Progress Notes (Addendum)
Ms [name] is a Type II diabetic, she checks CBG at times, Dr Naomie Dean manges diabetes.  Dr Montez Morita is patients PCP.  Ms [name] lives alone, she has CNA 2 hours a day.  Ms [name]'s daughter reports that patient has "slight dementia, it was worst when she was drinking alcohol." Ms [name] daughter Dr. Latina Craver reported that Ms [name] had an Echo at [facility] cardiology in 2014- I requested records and patient did not have an echo there. Last echo was 2012- mild regeration  In Tricuspid Valve was n oted. Ms [name] reports that CBG is never under 20, she is a retired [employer] scientist and said that she couldn't drink juice if CBG low, I asked patient and her daughter to call Pre- Op if CBG is low or with any questions.

## 2016-05-13 ENCOUNTER — Encounter (HOSPITAL_COMMUNITY): Payer: Self-pay | Admitting: *Deleted

## 2016-05-13 ENCOUNTER — Ambulatory Visit (HOSPITAL_COMMUNITY): Payer: Medicare Other | Admitting: Anesthesiology

## 2016-05-13 ENCOUNTER — Encounter (HOSPITAL_COMMUNITY)
Admission: RE | Disposition: A | Discharge status: Home / Self Care | Payer: Self-pay | Source: Ambulatory Visit | Attending: Otolaryngology

## 2016-05-13 ENCOUNTER — Ambulatory Visit (HOSPITAL_COMMUNITY)
Admission: RE | Admit: 2016-05-13 | Discharge: 2016-05-15 | Disposition: A | Discharge status: Home / Self Care | Payer: Medicare Other | Source: Ambulatory Visit | Attending: Otolaryngology | Admitting: Otolaryngology

## 2016-05-13 DIAGNOSIS — F17210 Nicotine dependence, cigarettes, uncomplicated: Secondary | ICD-10-CM | POA: Insufficient documentation

## 2016-05-13 DIAGNOSIS — K219 Gastro-esophageal reflux disease without esophagitis: Secondary | ICD-10-CM | POA: Insufficient documentation

## 2016-05-13 DIAGNOSIS — I10 Essential (primary) hypertension: Secondary | ICD-10-CM | POA: Insufficient documentation

## 2016-05-13 DIAGNOSIS — E1140 Type 2 diabetes mellitus with diabetic neuropathy, unspecified: Secondary | ICD-10-CM | POA: Diagnosis not present

## 2016-05-13 DIAGNOSIS — R011 Cardiac murmur, unspecified: Secondary | ICD-10-CM | POA: Insufficient documentation

## 2016-05-13 DIAGNOSIS — Z87440 Personal history of urinary (tract) infections: Secondary | ICD-10-CM | POA: Insufficient documentation

## 2016-05-13 DIAGNOSIS — C023 Malignant neoplasm of anterior two-thirds of tongue, part unspecified: Secondary | ICD-10-CM | POA: Diagnosis present

## 2016-05-13 DIAGNOSIS — C029 Malignant neoplasm of tongue, unspecified: Secondary | ICD-10-CM | POA: Diagnosis not present

## 2016-05-13 DIAGNOSIS — E039 Hypothyroidism, unspecified: Secondary | ICD-10-CM | POA: Insufficient documentation

## 2016-05-13 DIAGNOSIS — Z7984 Long term (current) use of oral hypoglycemic drugs: Secondary | ICD-10-CM | POA: Insufficient documentation

## 2016-05-13 DIAGNOSIS — Z888 Allergy status to other drugs, medicaments and biological substances status: Secondary | ICD-10-CM | POA: Diagnosis not present

## 2016-05-13 DIAGNOSIS — D649 Anemia, unspecified: Secondary | ICD-10-CM | POA: Diagnosis not present

## 2016-05-13 DIAGNOSIS — J449 Chronic obstructive pulmonary disease, unspecified: Secondary | ICD-10-CM | POA: Diagnosis not present

## 2016-05-13 DIAGNOSIS — I071 Rheumatic tricuspid insufficiency: Secondary | ICD-10-CM | POA: Diagnosis not present

## 2016-05-13 HISTORY — DX: Cardiac murmur, unspecified: R01.1

## 2016-05-13 HISTORY — PX: EXCISION OF TONGUE LESION: SHX6434

## 2016-05-13 HISTORY — DX: Rheumatic tricuspid insufficiency: I07.1

## 2016-05-13 HISTORY — DX: Malignant neoplasm of tongue, unspecified: C02.9

## 2016-05-13 HISTORY — PX: TONGUE SURGERY: SHX810

## 2016-05-13 HISTORY — DX: Polyneuropathy, unspecified: G62.9

## 2016-05-13 HISTORY — DX: Personal history of other medical treatment: Z92.89

## 2016-05-13 LAB — GLUCOSE, CAPILLARY
GLUCOSE-CAPILLARY: 160 mg/dL — AB (ref 65–99)
GLUCOSE-CAPILLARY: 306 mg/dL — AB (ref 65–99)
Glucose-Capillary: 138 mg/dL — ABNORMAL HIGH (ref 65–99)
Glucose-Capillary: 322 mg/dL — ABNORMAL HIGH (ref 65–99)

## 2016-05-13 LAB — CBC
HCT: 37.7 % (ref 36.0–46.0)
HEMOGLOBIN: 12.3 g/dL (ref 12.0–15.0)
MCH: 27.4 pg (ref 26.0–34.0)
MCHC: 32.6 g/dL (ref 30.0–36.0)
MCV: 84 fL (ref 78.0–100.0)
Platelets: 225 10*3/uL (ref 150–400)
RBC: 4.49 MIL/uL (ref 3.87–5.11)
RDW: 15.2 % (ref 11.5–15.5)
WBC: 9.9 10*3/uL (ref 4.0–10.5)

## 2016-05-13 LAB — BASIC METABOLIC PANEL
ANION GAP: 11 (ref 5–15)
BUN: 7 mg/dL (ref 6–20)
CALCIUM: 9.4 mg/dL (ref 8.9–10.3)
CO2: 23 mmol/L (ref 22–32)
Chloride: 109 mmol/L (ref 101–111)
Creatinine, Ser: 0.67 mg/dL (ref 0.44–1.00)
GFR calc Af Amer: >60 mL/min (ref >60)
GFR calc non Af Amer: >60 mL/min (ref >60)
GLUCOSE: 122 mg/dL — AB (ref 65–99)
Potassium: 3.1 mmol/L — ABNORMAL LOW (ref 3.5–5.1)
Sodium: 143 mmol/L (ref 135–145)

## 2016-05-13 LAB — NO BLOOD PRODUCTS

## 2016-05-13 SURGERY — EXCISION, LESION, TONGUE
Anesthesia: General | Site: Mouth | Laterality: Left

## 2016-05-13 MED ORDER — ONDANSETRON HCL 4 MG/2ML IJ SOLN
INTRAMUSCULAR | Status: DC | PRN
  Administered 2016-05-13: 4 mg via INTRAVENOUS

## 2016-05-13 MED ORDER — IBUPROFEN 200 MG PO TABS: 200.0000 mg | ORAL_TABLET | Freq: Four times a day (QID) | ORAL | Status: DC | PRN

## 2016-05-13 MED ORDER — HEPARIN SODIUM (PORCINE) 5000 UNIT/ML IJ SOLN
5000.0000 [IU] | Freq: Three times a day (TID) | INTRAMUSCULAR | Status: DC
  Administered 2016-05-13 – 2016-05-14 (×3): 5000 [IU] via SUBCUTANEOUS
  Filled 2016-05-13 (×3): qty 1

## 2016-05-13 MED ORDER — HYDROCODONE-ACETAMINOPHEN 7.5-325 MG/15ML PO SOLN
5.0000 mL | ORAL | Status: DC | PRN
Start: 2016-05-13 — End: 2016-05-15
  Administered 2016-05-13 – 2016-05-15 (×5): 10 mL via ORAL
  Filled 2016-05-13 (×6): qty 15

## 2016-05-13 MED ORDER — PROPOFOL 10 MG/ML IV BOLUS
INTRAVENOUS | Status: DC | PRN
  Administered 2016-05-13 (×2): 100 mg via INTRAVENOUS

## 2016-05-13 MED ORDER — CEFAZOLIN SODIUM-DEXTROSE 2-4 GM/100ML-% IV SOLN
2.0000 g | INTRAVENOUS | Status: AC
  Administered 2016-05-13: 2 g via INTRAVENOUS

## 2016-05-13 MED ORDER — ALBUTEROL SULFATE HFA 108 (90 BASE) MCG/ACT IN AERS
INHALATION_SPRAY | RESPIRATORY_TRACT | Status: DC | PRN
  Administered 2016-05-13: 2 via RESPIRATORY_TRACT

## 2016-05-13 MED ORDER — POTASSIUM CHLORIDE CRYS ER 10 MEQ PO TBCR
10.0000 meq | EXTENDED_RELEASE_TABLET | Freq: Two times a day (BID) | ORAL | Status: DC
  Administered 2016-05-13 – 2016-05-15 (×5): 10 meq via ORAL
  Filled 2016-05-13 (×5): qty 1

## 2016-05-13 MED ORDER — BACITRACIN ZINC 500 UNIT/GM EX OINT
1.0000 "application " | TOPICAL_OINTMENT | Freq: Three times a day (TID) | CUTANEOUS | Status: DC
  Administered 2016-05-13 – 2016-05-15 (×5): 1 via TOPICAL
  Filled 2016-05-13: qty 28.35

## 2016-05-13 MED ORDER — LIDOCAINE HCL (PF) 1 % IJ SOLN
INTRAMUSCULAR | Status: AC
  Filled 2016-05-13: qty 30

## 2016-05-13 MED ORDER — DEXAMETHASONE SODIUM PHOSPHATE 10 MG/ML IJ SOLN
10.0000 mg | Freq: Once | INTRAMUSCULAR | Status: AC
  Administered 2016-05-13: 10 mg via INTRAVENOUS

## 2016-05-13 MED ORDER — LISINOPRIL 10 MG PO TABS
10.0000 mg | ORAL_TABLET | Freq: Every day | ORAL | Status: DC
  Administered 2016-05-13 – 2016-05-15 (×3): 10 mg via ORAL
  Filled 2016-05-13 (×3): qty 1

## 2016-05-13 MED ORDER — GLIMEPIRIDE 1 MG PO TABS
1.0000 mg | ORAL_TABLET | Freq: Every day | ORAL | Status: DC
  Administered 2016-05-14 – 2016-05-15 (×2): 1 mg via ORAL
  Filled 2016-05-13 (×2): qty 1

## 2016-05-13 MED ORDER — POTASSIUM CHLORIDE IN NACL 20-0.45 MEQ/L-% IV SOLN
INTRAVENOUS | Status: DC
  Administered 2016-05-13 – 2016-05-14 (×2): via INTRAVENOUS
  Filled 2016-05-13 (×4): qty 1000

## 2016-05-13 MED ORDER — NEOSTIGMINE METHYLSULFATE 10 MG/10ML IV SOLN
INTRAVENOUS | Status: DC | PRN
  Administered 2016-05-13: 2 mg via INTRAVENOUS

## 2016-05-13 MED ORDER — LEVOTHYROXINE SODIUM 88 MCG PO TABS
88.0000 ug | ORAL_TABLET | Freq: Every day | ORAL | Status: DC
  Administered 2016-05-14 – 2016-05-15 (×2): 88 ug via ORAL
  Filled 2016-05-13 (×2): qty 1

## 2016-05-13 MED ORDER — FENTANYL CITRATE (PF) 100 MCG/2ML IJ SOLN
INTRAMUSCULAR | Status: AC
  Filled 2016-05-13: qty 2

## 2016-05-13 MED ORDER — INSULIN ASPART 100 UNIT/ML ~~LOC~~ SOLN
0.0000 [IU] | Freq: Three times a day (TID) | SUBCUTANEOUS | Status: DC
  Administered 2016-05-13: 7 [IU] via SUBCUTANEOUS
  Administered 2016-05-14: 2 [IU] via SUBCUTANEOUS

## 2016-05-13 MED ORDER — FENTANYL CITRATE (PF) 100 MCG/2ML IJ SOLN
INTRAMUSCULAR | Status: AC
  Administered 2016-05-13: 50 ug via INTRAVENOUS
  Filled 2016-05-13: qty 2

## 2016-05-13 MED ORDER — LATANOPROST 0.005 % OP SOLN
1.0000 [drp] | Freq: Every day | OPHTHALMIC | Status: DC
  Administered 2016-05-13 – 2016-05-14 (×2): 1 [drp] via OPHTHALMIC
  Filled 2016-05-13: qty 2.5

## 2016-05-13 MED ORDER — HYDRALAZINE HCL 20 MG/ML IJ SOLN
INTRAMUSCULAR | Status: AC
  Administered 2016-05-13: 10 mg
  Filled 2016-05-13: qty 1

## 2016-05-13 MED ORDER — BACITRACIN ZINC 500 UNIT/GM EX OINT
TOPICAL_OINTMENT | CUTANEOUS | Status: AC
  Filled 2016-05-13: qty 28.35

## 2016-05-13 MED ORDER — CHLORHEXIDINE GLUCONATE CLOTH 2 % EX PADS: 6.0000 | MEDICATED_PAD | Freq: Once | CUTANEOUS | Status: DC

## 2016-05-13 MED ORDER — ROCURONIUM BROMIDE 100 MG/10ML IV SOLN
INTRAVENOUS | Status: DC | PRN
  Administered 2016-05-13: 25 mg via INTRAVENOUS

## 2016-05-13 MED ORDER — FENTANYL CITRATE (PF) 100 MCG/2ML IJ SOLN
INTRAMUSCULAR | Status: DC | PRN
  Administered 2016-05-13 (×4): 50 ug via INTRAVENOUS

## 2016-05-13 MED ORDER — ONDANSETRON HCL 4 MG/2ML IJ SOLN: 4.0000 mg | Freq: Once | INTRAMUSCULAR | Status: DC | PRN

## 2016-05-13 MED ORDER — HYDROCODONE-ACETAMINOPHEN 7.5-325 MG/15ML PO SOLN: 5.0000 mL | ORAL | 0 refills | Status: DC | PRN

## 2016-05-13 MED ORDER — LIDOCAINE HCL (CARDIAC) 20 MG/ML IV SOLN
INTRAVENOUS | Status: DC | PRN
  Administered 2016-05-13: 60 mg via INTRAVENOUS

## 2016-05-13 MED ORDER — 0.9 % SODIUM CHLORIDE (POUR BTL) OPTIME
TOPICAL | Status: DC | PRN
  Administered 2016-05-13: 1000 mL

## 2016-05-13 MED ORDER — MORPHINE SULFATE (PF) 2 MG/ML IV SOLN
1.0000 mg | INTRAVENOUS | Status: DC | PRN
  Administered 2016-05-13 – 2016-05-14 (×4): 2 mg via INTRAVENOUS
  Filled 2016-05-13 (×5): qty 1

## 2016-05-13 MED ORDER — INSULIN ASPART 100 UNIT/ML ~~LOC~~ SOLN
7.0000 [IU] | Freq: Once | SUBCUTANEOUS | Status: AC
  Administered 2016-05-13: 7 [IU] via SUBCUTANEOUS

## 2016-05-13 MED ORDER — GLYCOPYRROLATE 0.2 MG/ML IJ SOLN
INTRAMUSCULAR | Status: DC | PRN
  Administered 2016-05-13: 0.3 mg via INTRAVENOUS

## 2016-05-13 MED ORDER — LACTATED RINGERS IV SOLN
INTRAVENOUS | Status: DC | PRN
  Administered 2016-05-13: 10:00:00 via INTRAVENOUS

## 2016-05-13 MED ORDER — FENTANYL CITRATE (PF) 100 MCG/2ML IJ SOLN
25.0000 ug | INTRAMUSCULAR | Status: DC | PRN
  Administered 2016-05-13 (×2): 50 ug via INTRAVENOUS

## 2016-05-13 MED ORDER — AMOXICILLIN 250 MG/5ML PO SUSR: 250.0000 mg | Freq: Three times a day (TID) | ORAL | 0 refills | Status: DC

## 2016-05-13 MED ORDER — METFORMIN HCL ER 500 MG PO TB24
500.0000 mg | ORAL_TABLET | Freq: Every day | ORAL | Status: DC
  Administered 2016-05-14 – 2016-05-15 (×2): 500 mg via ORAL
  Filled 2016-05-13 (×2): qty 1

## 2016-05-13 MED ORDER — FOLIC ACID 1 MG PO TABS
1.0000 mg | ORAL_TABLET | Freq: Every day | ORAL | Status: DC
  Administered 2016-05-13 – 2016-05-15 (×3): 1 mg via ORAL
  Filled 2016-05-13 (×3): qty 1

## 2016-05-13 MED ORDER — MORPHINE SULFATE (PF) 4 MG/ML IV SOLN
INTRAVENOUS | Status: AC
  Administered 2016-05-13: 2 mg
  Filled 2016-05-13: qty 1

## 2016-05-13 MED ORDER — LABETALOL HCL 5 MG/ML IV SOLN
INTRAVENOUS | Status: DC | PRN
  Administered 2016-05-13: 5 mg via INTRAVENOUS

## 2016-05-13 SURGICAL SUPPLY — 51 items
ATTRACTOMAT 16X20 MAGNETIC DRP (DRAPES) ×3 IMPLANT
BAG DECANTER FOR FLEXI CONT (MISCELLANEOUS) ×3 IMPLANT
BLADE SURG 15 STRL LF DISP TIS (BLADE) IMPLANT
BLADE SURG 15 STRL SS (BLADE)
BLADE SURG ROTATE 9660 (MISCELLANEOUS) ×3 IMPLANT
BNDG GAUZE ELAST 4 BULKY (GAUZE/BANDAGES/DRESSINGS) ×3 IMPLANT
CANISTER SUCTION 2500CC (MISCELLANEOUS) ×3 IMPLANT
CLEANER TIP ELECTROSURG 2X2 (MISCELLANEOUS) ×3 IMPLANT
CONT SPEC 4OZ CLIKSEAL STRL BL (MISCELLANEOUS) ×3 IMPLANT
CONT SPECI 4OZ STER CLIK (MISCELLANEOUS) ×3 IMPLANT
COVER SURGICAL LIGHT HANDLE (MISCELLANEOUS) ×3 IMPLANT
DRAIN CHANNEL 10F 3/8 F FF (DRAIN) IMPLANT
DRAIN SNY 7 FPER (WOUND CARE) IMPLANT
DRAPE PROXIMA HALF (DRAPES) IMPLANT
ELECT COATED BLADE 2.86 ST (ELECTRODE) ×3 IMPLANT
ELECT REM PT RETURN 9FT ADLT (ELECTROSURGICAL) ×3
ELECTRODE REM PT RTRN 9FT ADLT (ELECTROSURGICAL) ×1 IMPLANT
EVACUATOR SILICONE 100CC (DRAIN) IMPLANT
GAUZE SPONGE 4X4 16PLY XRAY LF (GAUZE/BANDAGES/DRESSINGS) ×3 IMPLANT
GLOVE BIO SURGEON STRL SZ7 (GLOVE) ×3 IMPLANT
GLOVE BIOGEL M 7.0 STRL (GLOVE) ×3 IMPLANT
GLOVE BIOGEL PI IND STRL 8.5 (GLOVE) IMPLANT
GLOVE BIOGEL PI INDICATOR 8.5 (GLOVE)
GLOVE INDICATOR 7.0 STRL GRN (GLOVE) ×6 IMPLANT
GOWN STRL REUS W/ TWL LRG LVL3 (GOWN DISPOSABLE) ×3 IMPLANT
GOWN STRL REUS W/TWL LRG LVL3 (GOWN DISPOSABLE) ×6
KIT BASIN OR (CUSTOM PROCEDURE TRAY) ×3 IMPLANT
KIT ROOM TURNOVER OR (KITS) ×3 IMPLANT
LOCATOR NERVE 3 VOLT (DISPOSABLE) ×3 IMPLANT
NEEDLE HYPO 25GX1X1/2 BEV (NEEDLE) IMPLANT
NS IRRIG 1000ML POUR BTL (IV SOLUTION) ×3 IMPLANT
PAD ARMBOARD 7.5X6 YLW CONV (MISCELLANEOUS) ×6 IMPLANT
PENCIL BUTTON HOLSTER BLD 10FT (ELECTRODE) ×3 IMPLANT
PROBE NERVBE PRASS .33 (MISCELLANEOUS) IMPLANT
SPONGE INTESTINAL PEANUT (DISPOSABLE) IMPLANT
SPONGE LAP 18X18 X RAY DECT (DISPOSABLE) ×3 IMPLANT
STAPLER VISISTAT 35W (STAPLE) ×3 IMPLANT
SUT CHROMIC 3 0 SH 27 (SUTURE) ×6 IMPLANT
SUT SILK 2 0 FS (SUTURE) ×9 IMPLANT
SUT SILK 2 0 REEL (SUTURE) ×3 IMPLANT
SUT SILK 3 0 REEL (SUTURE) ×3 IMPLANT
SUT VIC AB 3-0 FS2 27 (SUTURE) IMPLANT
SUT VIC AB 4-0 SH 27 (SUTURE)
SUT VIC AB 4-0 SH 27XBRD (SUTURE) IMPLANT
SYR BULB 3OZ (MISCELLANEOUS) ×3 IMPLANT
TOWEL OR 17X24 6PK STRL BLUE (TOWEL DISPOSABLE) ×3 IMPLANT
TRAY ENT MC OR (CUSTOM PROCEDURE TRAY) ×3 IMPLANT
TUBE CONNECTING 12'X1/4 (SUCTIONS) ×1
TUBE CONNECTING 12X1/4 (SUCTIONS) ×2 IMPLANT
UNDERPAD 30X30 (UNDERPADS AND DIAPERS) IMPLANT
WATER STERILE IRR 1000ML POUR (IV SOLUTION) ×3 IMPLANT

## 2016-05-13 NOTE — Progress Notes (Signed)
   ENT Progress Note: s/p Procedure(s): WIDE LOCAL EXCISION OF LEFT LATERAL TONGUE CANCER   Subjective: C/O mod discomfort, tol po fluids  Objective: Vital signs in last 24 hours: Temp:  [97 F (36.1 C)-98.3 F (36.8 C)] 97.5 F (36.4 C) (11/22 1241) Pulse Rate:  [65-77] 69 (11/22 1241) Resp:  [11-22] 14 (11/22 1241) BP: (153-211)/(65-78) 153/76 (11/22 1241) SpO2:  [98 %-100 %] 98 % (11/22 1241) Weight:  [46.3 kg (102 lb)-46.8 kg (103 lb 2.8 oz)] 46.8 kg (103 lb 2.8 oz) (11/22 1241) Weight change:     Intake/Output from previous day: No intake/output data recorded. Intake/Output this shift: Total I/O In: 900 [I.V.:900] Out: 50 [Blood:50]  Labs:  Recent Labs  05/13/16 0746  WBC 9.9  HGB 12.3  HCT 37.7  PLT 225    Recent Labs  05/13/16 0746  NA 143  K 3.1*  CL 109  CO2 23  GLUCOSE 122*  BUN 7  CALCIUM 9.4    Studies/Results: No results found.   PHYSICAL EXAM: Inc intact, min edema No bleeding Stable airway   Assessment/Plan: Stable after L ant hemiglossectomy with primary reconstruction Monitor po intake and pain management Plan d/c 11/24.       Ortiz, Tammy Goetzke 05/13/2016, 3:30 PM

## 2016-05-13 NOTE — Anesthesia Postprocedure Evaluation (Signed)
Anesthesia Post Note  Patient: [name]  Procedure(s) Performed: Procedure(s) (LRB): WIDE LOCAL EXCISION OF LEFT LATERAL TONGUE CANCER (Left)  Patient location during evaluation: PACU Anesthesia Type: General Level of consciousness: awake and alert Pain management: pain level controlled Vital Signs Assessment: post-procedure vital signs reviewed and stable Respiratory status: spontaneous breathing, nonlabored ventilation and respiratory function stable Cardiovascular status: blood pressure returned to baseline and stable Postop Assessment: no signs of nausea or vomiting Anesthetic complications: no    Last Vitals:  Vitals:   05/13/16 1150 05/13/16 1155  BP: (!) 208/74 (!) 182/65  Pulse: 72 76  Resp: 11 13  Temp:      Last Pain:  Vitals:   05/13/16 1210  TempSrc:   PainSc: 10-Worst pain ever                 Nilda Simmer

## 2016-05-13 NOTE — Progress Notes (Signed)
Patient rinsed with saline.

## 2016-05-13 NOTE — Brief Op Note (Signed)
05/13/2016  11:03 AM  PATIENT:  [name]  82 y.o. female  PRE-OPERATIVE DIAGNOSIS:  LEFT LATERAL TONGUE CANCER  POST-OPERATIVE DIAGNOSIS:  LEFT LATERAL TONGUE CANCER  PROCEDURE:  Procedure(s): WIDE LOCAL EXCISION OF LEFT LATERAL TONGUE CANCER (Left) with Local Soft Tissue Reconstruction   SURGEON:  Surgeon(s) and Role:    * Jerrell Belfast, MD - Primary  PHYSICIAN ASSISTANT: Etta Grandchild, PA  ASSISTANTS: none   ANESTHESIA:   general  EBL:  Total I/O In: -  Out: 50 [Blood:50]  BLOOD ADMINISTERED:none  DRAINS: none   LOCAL MEDICATIONS USED:  NONE  SPECIMEN:  Source of Specimen:  Left lateral tongue  DISPOSITION OF SPECIMEN:  PATHOLOGY  COUNTS:  YES  TOURNIQUET:  * No tourniquets in log *  DICTATION: .Other Dictation: Dictation Number [id number]  PLAN OF CARE: Admit to inpatient   PATIENT DISPOSITION:  PACU - hemodynamically stable.   Delay start of Pharmacological VTE agent (>24hrs) due to surgical blood loss or risk of bleeding: no

## 2016-05-13 NOTE — H&P (Signed)
[name] is an 82 y.o. female.   Chief Complaint: Exophytic Left tongue mass HPI: prog Left tongue lesion. Bx for SCCA  Past Medical History:  Diagnosis Date  . Abdominal pain   . Blood dyscrasia    HYPOKALEMIA  . Blood transfusion   . Cancer ([place])    Tongue  . COPD (chronic obstructive pulmonary disease) ([place])   . Diabetes mellitus    Type II  . GERD (gastroesophageal reflux disease)   . Heart murmur   . History of blood transfusion   . Hypertension   . Hypothyroid   . Neuropathy ([place])   . Substance abuse    ETOH with dementia and prior encephalopathy and withdrawal  . Tricuspid regurgitation 2012   mild  . Urinary tract infection     Past Surgical History:  Procedure Laterality Date  . ABDOMINAL HYSTERECTOMY     1970's  . ADENOIDECTOMY    . BREAST SURGERY Left    Lumpectomy  . COLONOSCOPY    . ESOPHAGOGASTRODUODENOSCOPY  05/17/2011   Procedure: ESOPHAGOGASTRODUODENOSCOPY (EGD);  Surgeon: Lafayette Dragon, MD;  Location[facility] ENDOSCOPY;  Service: Endoscopy;  Laterality: N/A;  . EYE SURGERY Bilateral    Cataract  . TONSILLECTOMY      Family History  Problem Relation Age of Onset  . Stroke Mother    Social History:  reports that she has been smoking Cigarettes.  She has a 15.00 pack-year smoking history. She has never used smokeless tobacco. She reports that she does not drink alcohol or use drugs.  Allergies:  Allergies  Allergen Reactions  . Tuberculin Tests Other (See Comments)    UNSPECIFIED REACTION  Pt is allergic to PPD (The PPD skin test is a method used to diagnose silent (latent) tuberculosis (TB) infection. PPD stands for purified protein derivative.) Pt should receive chest x-rays instead.    Medications Prior to Admission  Medication Sig Dispense Refill  . folic acid (FOLVITE) 1 MG tablet Take 1 mg by mouth daily.    Marland Kitchen glimepiride (AMARYL) 1 MG tablet Take 1 mg by mouth daily with breakfast.    . latanoprost (XALATAN) 0.005 % ophthalmic  solution Place 1 drop into both eyes at bedtime.    Marland Kitchen levothyroxine (SYNTHROID, LEVOTHROID) 88 MCG tablet Take 88 mcg by mouth daily before breakfast.    . lisinopril (PRINIVIL,ZESTRIL) 10 MG tablet Take 10 mg by mouth daily.    . metFORMIN (GLUCOPHAGE-XR) 500 MG 24 hr tablet Take 500 mg by mouth daily with breakfast.    . potassium chloride (K-DUR,KLOR-CON) 10 MEQ tablet Take 10 mEq by mouth 2 (two) times daily.    Marland Kitchen ibuprofen (ADVIL,MOTRIN) 200 MG tablet Take 200 mg by mouth every 6 (six) hours as needed for moderate pain.      Results for orders placed or performed during the hospital encounter of 05/13/16 (from the past 48 hour(s))  Basic metabolic panel     Status: Abnormal   Collection Time: 05/13/16  7:46 AM  Result Value Ref Range   Sodium 143 135 - 145 mmol/L   Potassium 3.1 (L) 3.5 - 5.1 mmol/L   Chloride 109 101 - 111 mmol/L   CO2 23 22 - 32 mmol/L   Glucose, Bld 122 (H) 65 - 99 mg/dL   BUN 7 6 - 20 mg/dL   Creatinine, Ser 0.67 0.44 - 1.00 mg/dL   Calcium 9.4 8.9 - 10.3 mg/dL   GFR calc non Af Amer >60 >60 mL/min  GFR calc Af Amer >60 >60 mL/min    Comment: (NOTE) The eGFR has been calculated using the CKD EPI equation. This calculation has not been validated in all clinical situations. eGFR's persistently <60 mL/min signify possible Chronic Kidney Disease.    Anion gap 11 5 - 15  CBC     Status: None   Collection Time: 05/13/16  7:46 AM  Result Value Ref Range   WBC 9.9 4.0 - 10.5 K/uL   RBC 4.49 3.87 - 5.11 MIL/uL   Hemoglobin 12.3 12.0 - 15.0 g/dL   HCT 37.7 36.0 - 46.0 %   MCV 84.0 78.0 - 100.0 fL   MCH 27.4 26.0 - 34.0 pg   MCHC 32.6 30.0 - 36.0 g/dL   RDW 15.2 11.5 - 15.5 %   Platelets 225 150 - 400 K/uL  Glucose, capillary     Status: Abnormal   Collection Time: 05/13/16  8:06 AM  Result Value Ref Range   Glucose-Capillary 138 (H) 65 - 99 mg/dL  No blood products     Status: None   Collection Time: 05/13/16  8:15 AM  Result Value Ref Range    Transfuse no blood products      TRANSFUSE NO BLOOD PRODUCTS, VERIFIED BY KRISTI MOORE,RN   No results found.  Review of Systems  Constitutional: Negative.   HENT: Negative.   Respiratory: Negative.   Cardiovascular: Negative.     Blood pressure (!) 211/73, pulse 72, temperature 98.3 F (36.8 C), temperature source Oral, resp. rate 18, height 5' 2"  (1.575 m), weight 46.3 kg (102 lb), last menstrual period 05/17/1983, SpO2 99 %. Physical Exam  Constitutional: She appears well-developed and well-nourished.  HENT:  Exophytic Left tongue mass  Neck: Normal range of motion. Neck supple.  Cardiovascular: Normal rate.   Respiratory: Effort normal.  GI: Soft.     Assessment/Plan Adm for Wide local excision and reconstruction under [place], Shaunette Gassner, MD 05/13/2016, 9:45 AM

## 2016-05-13 NOTE — Anesthesia Procedure Notes (Signed)
Procedure Name: Intubation Date/Time: 05/13/2016 9:58 AM Performed by: Shirlyn Goltz Pre-anesthesia Checklist: Patient identified, Emergency Drugs available, Suction available and Patient being monitored Patient Re-evaluated:Patient Re-evaluated prior to inductionOxygen Delivery Method: Circle system utilized Preoxygenation: Pre-oxygenation with 100% oxygen Intubation Type: IV induction Ventilation: Mask ventilation without difficulty and Oral airway inserted - appropriate to patient size Laryngoscope Size: Mac and 3 Grade View: Grade I Tube type: Oral Tube size: 7.0 mm Number of attempts: 1 Airway Equipment and Method: Stylet Placement Confirmation: ETT inserted through vocal cords under direct vision,  positive ETCO2 and breath sounds checked- equal and bilateral Secured at: 21 cm Tube secured with: Tape (tongue bleeding where tumor is )

## 2016-05-13 NOTE — Transfer of Care (Signed)
Immediate Anesthesia Transfer of Care Note  Patient: [name]  Procedure(s) Performed: Procedure(s): WIDE LOCAL EXCISION OF LEFT LATERAL TONGUE CANCER (Left)  Patient Location: PACU  Anesthesia Type:General  Level of Consciousness: awake, alert , oriented and patient cooperative  Airway & Oxygen Therapy: Patient Spontanous Breathing and Patient connected to face mask oxygen  Post-op Assessment: Report given to RN and Post -op Vital signs reviewed and stable  Post vital signs: Reviewed and stable  Last Vitals:  Vitals:   05/13/16 0921  BP: (!) 211/73  Pulse: 72  Resp: 18  Temp: 36.8 C    Last Pain:  Vitals:   05/13/16 0921  TempSrc: Oral  PainSc:       Patients Stated Pain Goal: 2 ([phone])  Complications: No apparent anesthesia complications

## 2016-05-14 ENCOUNTER — Encounter (HOSPITAL_COMMUNITY): Payer: Self-pay | Admitting: Otolaryngology

## 2016-05-14 DIAGNOSIS — C029 Malignant neoplasm of tongue, unspecified: Secondary | ICD-10-CM | POA: Diagnosis not present

## 2016-05-14 LAB — GLUCOSE, CAPILLARY
GLUCOSE-CAPILLARY: 195 mg/dL — AB (ref 65–99)
GLUCOSE-CAPILLARY: 90 mg/dL (ref 65–99)
Glucose-Capillary: 137 mg/dL — ABNORMAL HIGH (ref 65–99)
Glucose-Capillary: 154 mg/dL — ABNORMAL HIGH (ref 65–99)

## 2016-05-14 NOTE — Progress Notes (Addendum)
05/14/2016 12:41 PM  Delford Field [id number]  Post-Op Day 1    Temp:  [98 F (36.7 C)-100.2 F (37.9 C)] 99.3 F (37.4 C) (11/23 0959) Pulse Rate:  [66-80] 66 (11/23 0959) Resp:  [16-18] 16 (11/23 0959) BP: (126-148)/(44-71) 133/44 (11/23 0959) SpO2:  [97 %-99 %] 99 % (11/23 0959),     Intake/Output Summary (Last 24 hours) at 05/14/16 1241 Last data filed at 05/14/16 [id number]  Gross per 24 hour  Intake             2045 ml  Output                0 ml  Net             2045 ml    Results for orders placed or performed during the hospital encounter of 05/13/16 (from the past 24 hour(s))  Glucose, capillary     Status: Abnormal   Collection Time: 05/13/16  5:17 PM  Result Value Ref Range   Glucose-Capillary 306 (H) 65 - 99 mg/dL   Comment 1 Notify RN   Glucose, capillary     Status: Abnormal   Collection Time: 05/13/16  9:53 PM  Result Value Ref Range   Glucose-Capillary 322 (H) 65 - 99 mg/dL  Glucose, capillary     Status: Abnormal   Collection Time: 05/14/16  7:35 AM  Result Value Ref Range   Glucose-Capillary 195 (H) 65 - 99 mg/dL   Comment 1 Notify RN   Glucose, capillary     Status: Abnormal   Collection Time: 05/14/16 11:44 AM  Result Value Ref Range   Glucose-Capillary 137 (H) 65 - 99 mg/dL   Comment 1 Notify RN     SUBJECTIVE:  Mild-mod pain. No bleeding.  Able to swallow liquids.  Breathing well.  spont void. Not yet ambulating in hallways  OBJECTIVE:  Cheerful and animated.  Some dysarthria c/w tongue resection.  No dyspnea.  IMPRESSION:  Satisfactory check  PLAN:  Increase activity.  Possible discharge in AM tomorrow.  Eilee Schader   CBG [id number] today.

## 2016-05-14 NOTE — Op Note (Signed)
NAMESOILA, IPPOLITI            ACCOUNT NO.:  [id number]  MEDICAL RECORD NO.:  [id number]  LOCATION:  PERIO                        FACILITY:  [facility]  PHYSICIAN:  Early Chars. Wilburn Cornelia, M.D.DATE OF BIRTH:  08-09-1933  DATE OF PROCEDURE:  05/13/2016 DATE OF DISCHARGE:                              OPERATIVE REPORT   LOCATION:  [facility] Main OR.  PREOPERATIVE DIAGNOSIS:  Left lateral tongue carcinoma.  POSTOPERATIVE DIAGNOSIS:  Left lateral tongue carcinoma.  INDICATION FOR SURGERY:  Left lateral tongue carcinoma.  SURGICAL PROCEDURE:  Wide local excision, anterior 2/3 left tongue with reconstruction.  ANESTHESIA:  General endotracheal.  SURGEON:  Early Chars. Wilburn Cornelia, M.D.  ASSISTANT:  Jolene Provost, [facility].  COMPLICATIONS:  There were no complications.  BLOOD LOSS:  Less than 50 mL.  CONDITION:  The patient was transferred from the operating room to the recovery room in stable condition.  FINDINGS:  2 x 4 cm exophytic left lateral tongue mass extending anteriorly to the midline without involvement of the floor of mouth.  BRIEF HISTORY:  The patient is an 82 year old black female, who was referred to our office for evaluation of a painful ulceration involving the left lateral tongue.  Examination in the office showed a large exophytic mass consistent with carcinoma.  The patient has a history of chronic cigarette smoking.  Given her history and findings, I recommended workup including biopsy of the primary lesion and CT scan of the neck.  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 The CT scan of the neck showed changes in the left lateral tongue consistent with her primary tumor.  There was no evidence of adenopathy or mass within the neck.  Given the patient's history and physical findings and above workup, I recommended excision of the tumor under general anesthesia with reconstruction.  We also discussed the possibility of a neck dissection  given the patient's history and other medical issues, lack of palpable findings and no evidence of abnormal findings on CT scan with involvement only of the free tongue and not floor of mouth.  We opted to forego a neck dissection at this time and undertake primary local control of the tumor.  The risks and benefits of this procedure were discussed in detail with the patient and her daughter, who understood and agreed with our plan for surgery, which was scheduled on elective basis at [facility].  DESCRIPTION OF PROCEDURE:  The patient was brought to the operating room and placed in supine position on May 13, 2016.  She was positioned and general endotracheal anesthesia was established without difficulty. When the patient adequately anesthetized, she was prepped and draped and a surgical time-out was performed with correct identification of the patient and the surgical procedure including the left laterality of her tongue lesion.  The patient prepped, draped, and prepared for surgery.  Examination of the oral cavity was undertaken.  The patient had poor dentition.  No loose or broken teeth and no evidence of additional lesions with the exception of a 2 x 4 cm exophytic bloody ulcer involving the entire left lateral tongue.  The area was carefully palpated and non involved normal tongue tissue was demarcated along the  anterior midline of the tongue and extending posterior lateral.  The patient's tongue was retracted anteriorly with a silk suture and careful dissection was carried out along the margin of the tumor using Bovie electrocautery and dissecting between 1 and 1.5 cm lateral to the tumor edge.  The tumor was primarily exophytic.  Dissection was then carried out from superficial to deep along the entire incision line.  The mucosa margins appeared completely free of any tumor involvement.  There was one anterior deep area, which was sent for frozen section  analysis in the anterior tongue musculature which was benign.  No other palpable or apparent tumor involvement either on the specimen or in the primary excision site.  The specimen was then marked and sent to Pathology for gross microscopic evaluation. The patient's incision was then carefully irrigated.  There were several areas of point hemorrhage, which were cauterized with monopolar cautery. The patient's oral cavity was then reinspected and reconstruction was undertaken.  With the entire lesion removed, this created approximately 3 x 5 cm defect in the left lateral tongue involving the entire anterior 2/3 of the tongue.  This was closed with reconstructive techniques using deep 4- 0 chromic sutures to reapproximate the tongue musculature.  This performed in interrupted fashion.  The soft tissue from the floor of mouth was carefully advanced in order to allow closure of the defect and to free the tongue from lateral adhesions using interrupted horizontal mattressing 4-0 Vicryl suture and 4-0 chromic suture.  The suture line was then completed from posterior to anterior reapproximating the tongue tissue and freeing the floor of mouth tissue for adequate closure. There was no bleeding and no evidence of significant tongue tie with good tongue mobility because of the lateral flap elevation.  The patient's oral cavity and oropharynx were irrigated and suctioned. Orogastric tube was passed.  Stomach contents were aspirated.  The patient was then awakened from anesthetic, extubated, and was transferred from the operating room to recovery room in stable condition.  There were no complications.  Estimated blood loss was approximately 50 mL.          ______________________________ Early Chars. Wilburn Cornelia, M.D.     DLS/MEDQ  D:  [id number]  T:  05/14/2016  Job:  [id number]

## 2016-05-15 DIAGNOSIS — C029 Malignant neoplasm of tongue, unspecified: Secondary | ICD-10-CM | POA: Diagnosis not present

## 2016-05-15 LAB — GLUCOSE, CAPILLARY: GLUCOSE-CAPILLARY: 86 mg/dL (ref 65–99)

## 2016-05-15 NOTE — Discharge Instructions (Signed)
Pain medication as needed Antibiotics until they are completed Soft diet; avoid biting your tongue.  Ensure milkshakes or similar may help with dietary supplementation if needed Call Dr. Wilburn Cornelia for a recheck appointment, [phone] Call for bleeding, breathing troubles, excessive pain, same number as above

## 2016-05-15 NOTE — Discharge Summary (Signed)
05/15/2016 11:44 AM  Delford Field [id number]  Post-Op Day 2, discharge summary    Temp:  [99.1 F (37.3 C)-99.2 F (37.3 C)] 99.1 F (37.3 C) (11/24 0624) Pulse Rate:  [64-66] 64 (11/24 0624) Resp:  [17-18] 18 (11/24 0624) BP: (145-156)/(57-65) 156/57 (11/24 0624) SpO2:  [96 %-97 %] 97 % (11/24 0624),     Intake/Output Summary (Last 24 hours) at 05/15/16 1144 Last data filed at 05/14/16 1911  Gross per 24 hour  Intake          1111.25 ml  Output                0 ml  Net          1111.25 ml    Results for orders placed or performed during the hospital encounter of 05/13/16 (from the past 24 hour(s))  Glucose, capillary     Status: None   Collection Time: 05/14/16  5:06 PM  Result Value Ref Range   Glucose-Capillary 90 65 - 99 mg/dL   Comment 1 Notify RN   Glucose, capillary     Status: Abnormal   Collection Time: 05/14/16 10:28 PM  Result Value Ref Range   Glucose-Capillary 154 (H) 65 - 99 mg/dL  Glucose, capillary     Status: None   Collection Time: 05/15/16  8:05 AM  Result Value Ref Range   Glucose-Capillary 86 65 - 99 mg/dL    SUBJECTIVE:  Min pain. Swallowing soft solids. No breathing difficulty.  OBJECTIVE:  Tongue more mobile, less swollen.  Less dysarthria  IMPRESSION:  Satisfactory post op check  PLAN:  Discharge home  Admit: 46 NOV Discharge: 24 NOV Final Diagnosis:  Tongue cancer Proc:  Hemiglossectomy, 11 NOV Comp: none Cond:  Ambulatory, pain minimal. Eating/drinking. CBG's good Rx's:  Amoxicillin liquid, Hydrocodone liquid Recheck Dr. Wilburn Cornelia, 2 weeks Instructions written and given  Hosp Course:  Underwent surgery on day of admission.  Observed post op with rapid improvement in activity, and po intake.  CBG's initially high but improved with improved po diet.  Ambulatory. Breathing well. Pain controlled. Eating soft solids.  Discharge to home in care of family on POD 2.    Jodi Marble

## 2016-05-15 NOTE — Progress Notes (Addendum)
Pt is ambulating in the hall with steady gait. Pain meds given for tongue pain, pain controlled. Discharge instructions given to pt, verbalized understanding. Discharged home accompanied by daughter. Hyacet 42ml wasted, witnessed by Derald Macleod RN.

## 2016-06-01 ENCOUNTER — Other Ambulatory Visit: Payer: Self-pay | Admitting: Otolaryngology

## 2016-06-05 ENCOUNTER — Encounter (HOSPITAL_COMMUNITY)
Admission: RE | Admit: 2016-06-05 | Discharge: 2016-06-05 | Disposition: A | Discharge status: Home / Self Care | Payer: Medicare Other | Source: Ambulatory Visit | Attending: Otolaryngology | Admitting: Otolaryngology

## 2016-06-05 ENCOUNTER — Encounter (HOSPITAL_COMMUNITY): Payer: Self-pay

## 2016-06-05 DIAGNOSIS — Z01818 Encounter for other preprocedural examination: Secondary | ICD-10-CM | POA: Insufficient documentation

## 2016-06-05 DIAGNOSIS — E119 Type 2 diabetes mellitus without complications: Secondary | ICD-10-CM | POA: Insufficient documentation

## 2016-06-05 HISTORY — DX: Unspecified dementia, unspecified severity, without behavioral disturbance, psychotic disturbance, mood disturbance, and anxiety: F03.90

## 2016-06-05 LAB — CBC
HCT: 35.5 % — ABNORMAL LOW (ref 36.0–46.0)
Hemoglobin: 11.7 g/dL — ABNORMAL LOW (ref 12.0–15.0)
MCH: 27.4 pg (ref 26.0–34.0)
MCHC: 33 g/dL (ref 30.0–36.0)
MCV: 83.1 fL (ref 78.0–100.0)
PLATELETS: 240 10*3/uL (ref 150–400)
RBC: 4.27 MIL/uL (ref 3.87–5.11)
RDW: 15 % (ref 11.5–15.5)
WBC: 8.9 10*3/uL (ref 4.0–10.5)

## 2016-06-05 LAB — BASIC METABOLIC PANEL
ANION GAP: 9 (ref 5–15)
BUN: 8 mg/dL (ref 6–20)
CALCIUM: 9.4 mg/dL (ref 8.9–10.3)
CO2: 23 mmol/L (ref 22–32)
CREATININE: 0.7 mg/dL (ref 0.44–1.00)
Chloride: 110 mmol/L (ref 101–111)
GFR calc Af Amer: >60 mL/min (ref >60)
GLUCOSE: 160 mg/dL — AB (ref 65–99)
Potassium: 3.6 mmol/L (ref 3.5–5.1)
Sodium: 142 mmol/L (ref 135–145)

## 2016-06-05 LAB — GLUCOSE, CAPILLARY: GLUCOSE-CAPILLARY: 169 mg/dL — AB (ref 65–99)

## 2016-06-05 NOTE — Progress Notes (Signed)
Pt denies having SOB and chest pain. Pt PCP is Dr. Montez Morita. Pt denies having a stress test and cardiac cath.

## 2016-06-05 NOTE — Pre-Procedure Instructions (Signed)
[name]  06/05/2016      [id number] - [place], [place] - [address] [place] [address] Phone: [phone] Fax: [phone]  [id number] - [place], [place] AT [address] [place] [address] Phone: [phone] Fax: [phone]    Your procedure is scheduled on Friday, June 12, 2016  [facility] Ad[facility] at 7:30 A.M.  Call this number if you have problems the morning of surgery:  [phone]   Remember:  Do not eat food or drink liquids after midnight Thursday, June 11, 2016  Take these medicines the morning of surgery with A SIP OF WATER :levothyroxine (SYNTHROID) If needed: Hydrocodone for pain Stop taking Aspirin, vitamins, fish oil and herbal medications. Do not take any NSAIDs ie: Ibuprofen, Advil, Naproxen, BC and Goody Powder or any medication containing Aspirin; stop now.    How to Manage Your Diabetes Before and After Surgery  Why is it important to control my blood sugar before and after surgery? . Improving blood sugar levels before and after surgery helps healing and can limit problems. . A way of improving blood sugar control is eating a healthy diet by: o  Eating less sugar and carbohydrates o  Increasing activity/exercise o  Talking with your doctor about reaching your blood sugar goals . High blood sugars (greater than 180 mg/dL) can raise your risk of infections and slow your recovery, so you will need to focus on controlling your diabetes during the weeks before surgery. . Make sure that the doctor who takes care of your diabetes knows about your planned surgery including the date and location.  How do I manage my blood sugar before surgery? . Check your blood sugar at least 4 times a day, starting 2 days before surgery, to make sure that the level is not too high or low. o Check your blood sugar the morning of your surgery when  you wake up and every 2 hours until you get to the Short Stay unit. . If your blood sugar is less than 70 mg/dL, you will need to treat for low blood sugar: o Do not take insulin. o Treat a low blood sugar (less than 70 mg/dL) with  cup of clear juice (cranberry or apple), 4 glucose tablets, OR glucose gel. o Recheck blood sugar in 15 minutes after treatment (to make sure it is greater than 70 mg/dL). If your blood sugar is not greater than 70 mg/dL on recheck, call [phone] for further instructions. . Report your blood sugar to the short stay nurse when you get to Short Stay.  . If you are admitted to the hospital after surgery: o Your blood sugar will be checked by the staff and you will probably be given insulin after surgery (instead of oral diabetes medicines) to make sure you have good blood sugar levels. o The goal for blood sugar control after surgery is 80-180 mg/dL.  WHAT DO I DO ABOUT MY DIABETES MEDICATION?   Marland Kitchen Do not take oral diabetes medicines (pills) the morning of surgery such as glimepiride (AMARYL) and metFORMIN (GLUCOPHAGE-XR  Patient Signature:  Date:   Nurse Signature:  Date:   Reviewed and Endorsed by [facility] Patient Education Committee, August 2015  Do not wear jewelry, make-up or nail polish.  Do not wear lotions, powders, or perfumes, or deoderant.  Do not shave 48 hours prior to surgery.  Do not bring valuables to the hospital.  [facility] is not responsible for any belongings or valuables.  Contacts, dentures or bridgework may not be worn into surgery.  Leave your suitcase in the car.  After surgery it may be brought to your room.  For patients admitted to the hospital, discharge time will be determined by your treatment team.  Patients discharged the day of surgery will not be allowed to drive home.   Special instructions: Shower the night before surgery and the morning of surgery with CHG.  Please read over the following fact sheets that  you were given. Pain Booklet, Coughing and Deep Breathing and Surgical Site Infection Prevention

## 2016-06-06 LAB — HEMOGLOBIN A1C
Hgb A1c MFr Bld: 8.4 % — ABNORMAL HIGH (ref 4.8–5.6)
Mean Plasma Glucose: 194 mg/dL

## 2016-06-11 ENCOUNTER — Encounter (HOSPITAL_COMMUNITY): Payer: Self-pay

## 2016-06-11 MED ORDER — DEXAMETHASONE SODIUM PHOSPHATE 10 MG/ML IJ SOLN
10.0000 mg | Freq: Once | INTRAMUSCULAR | Status: AC
  Administered 2016-06-12: 10 mg via INTRAVENOUS
  Filled 2016-06-11: qty 1

## 2016-06-11 MED ORDER — CEFAZOLIN SODIUM-DEXTROSE 2-4 GM/100ML-% IV SOLN
2.0000 g | INTRAVENOUS | Status: AC
  Administered 2016-06-12: 2 g via INTRAVENOUS
  Filled 2016-06-11: qty 100

## 2016-06-12 ENCOUNTER — Observation Stay (HOSPITAL_COMMUNITY)
Admission: RE | Admit: 2016-06-12 | Discharge: 2016-06-14 | Disposition: A | Discharge status: Home / Self Care | Payer: Medicare Other | Source: Ambulatory Visit | Attending: Otolaryngology | Admitting: Otolaryngology

## 2016-06-12 ENCOUNTER — Ambulatory Visit (HOSPITAL_COMMUNITY): Payer: Medicare Other | Admitting: Certified Registered Nurse Anesthetist

## 2016-06-12 ENCOUNTER — Encounter (HOSPITAL_COMMUNITY)
Admission: RE | Disposition: A | Discharge status: Home / Self Care | Payer: Self-pay | Source: Ambulatory Visit | Attending: Otolaryngology

## 2016-06-12 ENCOUNTER — Encounter (HOSPITAL_COMMUNITY): Payer: Self-pay | Admitting: Otolaryngology

## 2016-06-12 DIAGNOSIS — Z7984 Long term (current) use of oral hypoglycemic drugs: Secondary | ICD-10-CM | POA: Diagnosis not present

## 2016-06-12 DIAGNOSIS — J449 Chronic obstructive pulmonary disease, unspecified: Secondary | ICD-10-CM | POA: Insufficient documentation

## 2016-06-12 DIAGNOSIS — I071 Rheumatic tricuspid insufficiency: Secondary | ICD-10-CM | POA: Insufficient documentation

## 2016-06-12 DIAGNOSIS — F17210 Nicotine dependence, cigarettes, uncomplicated: Secondary | ICD-10-CM | POA: Insufficient documentation

## 2016-06-12 DIAGNOSIS — E119 Type 2 diabetes mellitus without complications: Secondary | ICD-10-CM | POA: Insufficient documentation

## 2016-06-12 DIAGNOSIS — E039 Hypothyroidism, unspecified: Secondary | ICD-10-CM | POA: Diagnosis not present

## 2016-06-12 DIAGNOSIS — C023 Malignant neoplasm of anterior two-thirds of tongue, part unspecified: Secondary | ICD-10-CM | POA: Diagnosis present

## 2016-06-12 DIAGNOSIS — F0390 Unspecified dementia without behavioral disturbance: Secondary | ICD-10-CM | POA: Insufficient documentation

## 2016-06-12 DIAGNOSIS — I10 Essential (primary) hypertension: Secondary | ICD-10-CM | POA: Diagnosis not present

## 2016-06-12 DIAGNOSIS — C029 Malignant neoplasm of tongue, unspecified: Principal | ICD-10-CM | POA: Insufficient documentation

## 2016-06-12 DIAGNOSIS — Z79899 Other long term (current) drug therapy: Secondary | ICD-10-CM | POA: Diagnosis not present

## 2016-06-12 HISTORY — PX: EXCISION OF TONGUE LESION: SHX6434

## 2016-06-12 LAB — GLUCOSE, CAPILLARY
GLUCOSE-CAPILLARY: 131 mg/dL — AB (ref 65–99)
GLUCOSE-CAPILLARY: 331 mg/dL — AB (ref 65–99)
Glucose-Capillary: 197 mg/dL — ABNORMAL HIGH (ref 65–99)
Glucose-Capillary: 286 mg/dL — ABNORMAL HIGH (ref 65–99)
Glucose-Capillary: 365 mg/dL — ABNORMAL HIGH (ref 65–99)

## 2016-06-12 LAB — CBC
HCT: 35.4 % — ABNORMAL LOW (ref 36.0–46.0)
Hemoglobin: 11.5 g/dL — ABNORMAL LOW (ref 12.0–15.0)
MCH: 27.1 pg (ref 26.0–34.0)
MCHC: 32.5 g/dL (ref 30.0–36.0)
MCV: 83.3 fL (ref 78.0–100.0)
PLATELETS: 197 10*3/uL (ref 150–400)
RBC: 4.25 MIL/uL (ref 3.87–5.11)
RDW: 15.3 % (ref 11.5–15.5)
WBC: 11 10*3/uL — AB (ref 4.0–10.5)

## 2016-06-12 LAB — CREATININE, SERUM
CREATININE: 0.66 mg/dL (ref 0.44–1.00)
GFR calc non Af Amer: >60 mL/min (ref >60)

## 2016-06-12 SURGERY — EXCISION, LESION, TONGUE
Anesthesia: General | Laterality: Left

## 2016-06-12 MED ORDER — HYDROCODONE-ACETAMINOPHEN 7.5-325 MG/15ML PO SOLN
5.0000 mL | ORAL | Status: DC | PRN
  Administered 2016-06-12 – 2016-06-14 (×8): 10 mL via ORAL
  Filled 2016-06-12 (×8): qty 15

## 2016-06-12 MED ORDER — CHLORHEXIDINE GLUCONATE CLOTH 2 % EX PADS: 6.0000 | MEDICATED_PAD | Freq: Once | CUTANEOUS | Status: DC

## 2016-06-12 MED ORDER — DEXTROSE-NACL 5-0.9 % IV SOLN
INTRAVENOUS | Status: DC
  Administered 2016-06-12: 13:00:00 via INTRAVENOUS

## 2016-06-12 MED ORDER — MORPHINE SULFATE (PF) 2 MG/ML IV SOLN
2.0000 mg | INTRAVENOUS | Status: DC | PRN
  Administered 2016-06-12: 4 mg via INTRAVENOUS

## 2016-06-12 MED ORDER — LEVOTHYROXINE SODIUM 88 MCG PO TABS
88.0000 ug | ORAL_TABLET | Freq: Every day | ORAL | Status: DC
  Administered 2016-06-13 – 2016-06-14 (×2): 88 ug via ORAL
  Filled 2016-06-12 (×2): qty 1

## 2016-06-12 MED ORDER — LATANOPROST 0.005 % OP SOLN
1.0000 [drp] | Freq: Every day | OPHTHALMIC | Status: DC
  Administered 2016-06-12 – 2016-06-13 (×2): 1 [drp] via OPHTHALMIC
  Filled 2016-06-12: qty 2.5

## 2016-06-12 MED ORDER — ONDANSETRON HCL 4 MG/2ML IJ SOLN: 4.0000 mg | INTRAMUSCULAR | Status: DC | PRN

## 2016-06-12 MED ORDER — LIDOCAINE-EPINEPHRINE (PF) 1 %-1:200000 IJ SOLN
INTRAMUSCULAR | Status: AC
  Filled 2016-06-12: qty 30

## 2016-06-12 MED ORDER — LISINOPRIL 10 MG PO TABS
10.0000 mg | ORAL_TABLET | Freq: Every day | ORAL | Status: DC
  Administered 2016-06-12 – 2016-06-14 (×3): 10 mg via ORAL
  Filled 2016-06-12 (×3): qty 1

## 2016-06-12 MED ORDER — LABETALOL HCL 5 MG/ML IV SOLN
INTRAVENOUS | Status: DC | PRN
  Administered 2016-06-12: 10 mg via INTRAVENOUS

## 2016-06-12 MED ORDER — POTASSIUM CHLORIDE CRYS ER 10 MEQ PO TBCR
10.0000 meq | EXTENDED_RELEASE_TABLET | Freq: Two times a day (BID) | ORAL | Status: DC
  Administered 2016-06-12 – 2016-06-14 (×5): 10 meq via ORAL
  Filled 2016-06-12 (×5): qty 1

## 2016-06-12 MED ORDER — GLIMEPIRIDE 1 MG PO TABS
1.0000 mg | ORAL_TABLET | Freq: Every day | ORAL | Status: DC
  Administered 2016-06-13 – 2016-06-14 (×2): 1 mg via ORAL
  Filled 2016-06-12 (×2): qty 1

## 2016-06-12 MED ORDER — MORPHINE SULFATE (PF) 4 MG/ML IV SOLN
INTRAVENOUS | Status: AC
  Filled 2016-06-12: qty 1

## 2016-06-12 MED ORDER — FENTANYL CITRATE (PF) 100 MCG/2ML IJ SOLN
INTRAMUSCULAR | Status: AC
  Filled 2016-06-12: qty 2

## 2016-06-12 MED ORDER — HEPARIN SODIUM (PORCINE) 5000 UNIT/ML IJ SOLN
5000.0000 [IU] | Freq: Three times a day (TID) | INTRAMUSCULAR | Status: DC
  Administered 2016-06-12 – 2016-06-13 (×2): 5000 [IU] via SUBCUTANEOUS
  Filled 2016-06-12 (×3): qty 1

## 2016-06-12 MED ORDER — LACTATED RINGERS IV SOLN
INTRAVENOUS | Status: DC
  Administered 2016-06-12 (×2): via INTRAVENOUS

## 2016-06-12 MED ORDER — SUGAMMADEX SODIUM 200 MG/2ML IV SOLN
INTRAVENOUS | Status: DC | PRN
  Administered 2016-06-12: 100 mg via INTRAVENOUS

## 2016-06-12 MED ORDER — LIDOCAINE HCL (CARDIAC) 20 MG/ML IV SOLN
INTRAVENOUS | Status: DC | PRN
  Administered 2016-06-12: 60 mg via INTRAVENOUS

## 2016-06-12 MED ORDER — ROCURONIUM BROMIDE 100 MG/10ML IV SOLN
INTRAVENOUS | Status: DC | PRN
  Administered 2016-06-12: 25 mg via INTRAVENOUS

## 2016-06-12 MED ORDER — FENTANYL CITRATE (PF) 100 MCG/2ML IJ SOLN
INTRAMUSCULAR | Status: DC | PRN
  Administered 2016-06-12 (×2): 50 ug via INTRAVENOUS

## 2016-06-12 MED ORDER — HYDROCODONE-ACETAMINOPHEN 7.5-325 MG/15ML PO SOLN: 10.0000 mL | ORAL | 0 refills | Status: DC | PRN

## 2016-06-12 MED ORDER — IBUPROFEN 100 MG/5ML PO SUSP
400.0000 mg | Freq: Four times a day (QID) | ORAL | Status: DC | PRN
  Filled 2016-06-12: qty 20

## 2016-06-12 MED ORDER — FOLIC ACID 1 MG PO TABS
1.0000 mg | ORAL_TABLET | Freq: Every day | ORAL | Status: DC
  Administered 2016-06-12 – 2016-06-14 (×3): 1 mg via ORAL
  Filled 2016-06-12 (×3): qty 1

## 2016-06-12 MED ORDER — HEPARIN SODIUM (PORCINE) 5000 UNIT/ML IJ SOLN: 5000.0000 [IU] | Freq: Three times a day (TID) | INTRAMUSCULAR | Status: DC

## 2016-06-12 MED ORDER — ONDANSETRON HCL 4 MG/2ML IJ SOLN
INTRAMUSCULAR | Status: DC | PRN
  Administered 2016-06-12: 4 mg via INTRAVENOUS

## 2016-06-12 MED ORDER — ONDANSETRON HCL 4 MG PO TABS: 4.0000 mg | ORAL_TABLET | ORAL | Status: DC | PRN

## 2016-06-12 MED ORDER — 0.9 % SODIUM CHLORIDE (POUR BTL) OPTIME
TOPICAL | Status: DC | PRN
  Administered 2016-06-12: 1000 mL

## 2016-06-12 MED ORDER — IPRATROPIUM-ALBUTEROL 20-100 MCG/ACT IN AERS
INHALATION_SPRAY | RESPIRATORY_TRACT | Status: DC | PRN
  Administered 2016-06-12: 1 via RESPIRATORY_TRACT

## 2016-06-12 MED ORDER — INSULIN ASPART 100 UNIT/ML ~~LOC~~ SOLN
0.0000 [IU] | Freq: Three times a day (TID) | SUBCUTANEOUS | Status: DC
  Administered 2016-06-12: 9 [IU] via SUBCUTANEOUS
  Administered 2016-06-13 (×2): 1 [IU] via SUBCUTANEOUS

## 2016-06-12 MED ORDER — PROPOFOL 10 MG/ML IV BOLUS
INTRAVENOUS | Status: AC
  Filled 2016-06-12: qty 20

## 2016-06-12 MED ORDER — FENTANYL CITRATE (PF) 100 MCG/2ML IJ SOLN
25.0000 ug | INTRAMUSCULAR | Status: DC | PRN
  Administered 2016-06-12 (×2): 25 ug via INTRAVENOUS
  Administered 2016-06-12: 50 ug via INTRAVENOUS

## 2016-06-12 MED ORDER — METFORMIN HCL ER 500 MG PO TB24
500.0000 mg | ORAL_TABLET | Freq: Every day | ORAL | Status: DC
  Administered 2016-06-12 – 2016-06-13 (×2): 500 mg via ORAL
  Filled 2016-06-12 (×2): qty 1

## 2016-06-12 MED ORDER — HYDROCODONE-ACETAMINOPHEN 7.5-325 MG/15ML PO SOLN: 10.0000 mL | ORAL | Status: DC | PRN

## 2016-06-12 MED ORDER — PROPOFOL 10 MG/ML IV BOLUS
INTRAVENOUS | Status: DC | PRN
  Administered 2016-06-12: 100 mg via INTRAVENOUS
  Administered 2016-06-12: 30 mg via INTRAVENOUS

## 2016-06-12 MED ORDER — AMOXICILLIN 250 MG/5ML PO SUSR: 500.0000 mg | Freq: Three times a day (TID) | ORAL | 0 refills | Status: DC

## 2016-06-12 SURGICAL SUPPLY — 50 items
ATTRACTOMAT 16X20 MAGNETIC DRP (DRAPES) IMPLANT
BAG DECANTER FOR FLEXI CONT (MISCELLANEOUS) ×3 IMPLANT
BLADE SURG 15 STRL LF DISP TIS (BLADE) IMPLANT
BLADE SURG 15 STRL SS (BLADE)
BLADE SURG ROTATE 9660 (MISCELLANEOUS) ×3 IMPLANT
BNDG GAUZE ELAST 4 BULKY (GAUZE/BANDAGES/DRESSINGS) ×3 IMPLANT
CANISTER SUCTION 2500CC (MISCELLANEOUS) ×3 IMPLANT
CLEANER TIP ELECTROSURG 2X2 (MISCELLANEOUS) ×6 IMPLANT
CONT SPEC 4OZ CLIKSEAL STRL BL (MISCELLANEOUS) ×3 IMPLANT
COVER SURGICAL LIGHT HANDLE (MISCELLANEOUS) ×3 IMPLANT
DRAIN CHANNEL 10F 3/8 F FF (DRAIN) IMPLANT
DRAIN SNY 7 FPER (WOUND CARE) IMPLANT
DRAPE PROXIMA HALF (DRAPES) IMPLANT
ELECT COATED BLADE 2.86 ST (ELECTRODE) ×3 IMPLANT
ELECT PAIRED SUBDERMAL (MISCELLANEOUS)
ELECT REM PT RETURN 9FT ADLT (ELECTROSURGICAL) ×3
ELECTRODE PAIRED SUBDERMAL (MISCELLANEOUS) IMPLANT
ELECTRODE REM PT RTRN 9FT ADLT (ELECTROSURGICAL) ×1 IMPLANT
EVACUATOR SILICONE 100CC (DRAIN) IMPLANT
GAUZE SPONGE 4X4 16PLY XRAY LF (GAUZE/BANDAGES/DRESSINGS) ×3 IMPLANT
GLOVE BIOGEL M 7.0 STRL (GLOVE) ×3 IMPLANT
GOWN STRL REUS W/ TWL LRG LVL3 (GOWN DISPOSABLE) ×2 IMPLANT
GOWN STRL REUS W/TWL LRG LVL3 (GOWN DISPOSABLE) ×4
KIT BASIN OR (CUSTOM PROCEDURE TRAY) ×3 IMPLANT
KIT ROOM TURNOVER OR (KITS) ×3 IMPLANT
LOCATOR NERVE 3 VOLT (DISPOSABLE) ×3 IMPLANT
NEEDLE HYPO 25GX1X1/2 BEV (NEEDLE) IMPLANT
NS IRRIG 1000ML POUR BTL (IV SOLUTION) ×3 IMPLANT
PAD ARMBOARD 7.5X6 YLW CONV (MISCELLANEOUS) ×6 IMPLANT
PENCIL BUTTON HOLSTER BLD 10FT (ELECTRODE) ×3 IMPLANT
ROLLS DENTAL (MISCELLANEOUS) IMPLANT
SPONGE INTESTINAL PEANUT (DISPOSABLE) IMPLANT
SPONGE LAP 18X18 X RAY DECT (DISPOSABLE) ×3 IMPLANT
STAPLER VISISTAT 35W (STAPLE) ×3 IMPLANT
SUT CHROMIC 4 0 RB 1X27 (SUTURE) ×3 IMPLANT
SUT SILK 2 0 FS (SUTURE) IMPLANT
SUT SILK 2 0 REEL (SUTURE) ×3 IMPLANT
SUT SILK 3 0 REEL (SUTURE) ×6 IMPLANT
SUT VIC AB 3-0 FS2 27 (SUTURE) IMPLANT
SUT VIC AB 4-0 SH 27 (SUTURE)
SUT VIC AB 4-0 SH 27XBRD (SUTURE) IMPLANT
SYR 5ML LL (SYRINGE) IMPLANT
SYR BULB 3OZ (MISCELLANEOUS) ×3 IMPLANT
TOWEL OR 17X24 6PK STRL BLUE (TOWEL DISPOSABLE) ×3 IMPLANT
TRAY ENT MC OR (CUSTOM PROCEDURE TRAY) ×3 IMPLANT
TRAY FOLEY CATH 14FRSI W/METER (CATHETERS) IMPLANT
TUBE CONNECTING 12'X1/4 (SUCTIONS) ×1
TUBE CONNECTING 12X1/4 (SUCTIONS) ×2 IMPLANT
UNDERPAD 30X30 (UNDERPADS AND DIAPERS) IMPLANT
WATER STERILE IRR 1000ML POUR (IV SOLUTION) ×3 IMPLANT

## 2016-06-12 NOTE — Brief Op Note (Signed)
06/12/2016  10:46 AM  PATIENT:  [name]  82 y.o. female  PRE-OPERATIVE DIAGNOSIS:  LEFT TONGUE CANCER  POST-OPERATIVE DIAGNOSIS:  LEFT TONGUE CANCER  PROCEDURE:  Procedure(s): RE-EXCISION OF LEFT  TONGUE CANCER (Left)  Left Hemiglossectomy  SURGEON:  Surgeon(s) and Role:    * Jerrell Belfast, MD - Primary  PHYSICIAN ASSISTANT: Etta Grandchild, PA  ASSISTANTS: none   ANESTHESIA:   general  EBL:  No intake/output data recorded. Min  BLOOD ADMINISTERED:none  DRAINS: none   LOCAL MEDICATIONS USED:  NONE  SPECIMEN:  Source of Specimen:  Left tongue tissue  DISPOSITION OF SPECIMEN:  PATHOLOGY  COUNTS:  YES  TOURNIQUET:  * No tourniquets in log *  DICTATION: .Other Dictation: Dictation Number [id number]  PLAN OF CARE: Admit for overnight observation  PATIENT DISPOSITION:  PACU - hemodynamically stable.   Delay start of Pharmacological VTE agent (>24hrs) due to surgical blood loss or risk of bleeding: not applicable

## 2016-06-12 NOTE — Anesthesia Procedure Notes (Signed)
Procedure Name: Intubation Date/Time: 06/12/2016 10:36 AM Performed by: Oletta Lamas Pre-anesthesia Checklist: Patient identified, Emergency Drugs available, Suction available and Patient being monitored Patient Re-evaluated:Patient Re-evaluated prior to inductionOxygen Delivery Method: Circle System Utilized Preoxygenation: Pre-oxygenation with 100% oxygen Intubation Type: IV induction Ventilation: Mask ventilation without difficulty Laryngoscope Size: Mac and 3 Grade View: Grade I Tube type: Oral Number of attempts: 1 Airway Equipment and Method: Stylet and Oral airway Placement Confirmation: ETT inserted through vocal cords under direct vision,  positive ETCO2 and breath sounds checked- equal and bilateral Secured at: 22 cm Tube secured with: Tape Dental Injury: Teeth and Oropharynx as per pre-operative assessment

## 2016-06-12 NOTE — Anesthesia Preprocedure Evaluation (Addendum)
Anesthesia Evaluation  Patient identified by MRN, date of birth, ID band Patient awake    Reviewed: Allergy & Precautions, NPO status , Patient's Chart, lab work & pertinent test results  History of Anesthesia Complications Negative for: history of anesthetic complications  Airway Mallampati: III  TM Distance: >3 FB Neck ROM: Limited    Dental  (+) Missing, Edentulous Upper,    Pulmonary neg shortness of breath, neg sleep apnea, COPD, neg recent URI, Current Smoker,    Pulmonary exam normal breath sounds clear to auscultation- rhonchi       Cardiovascular hypertension, Pt. on medications (-) angina(-) Past MI, (-) Cardiac Stents, (-) CABG, (-) Orthopnea, (-) PND and (-) DOE (-) dysrhythmias + Valvular Problems/Murmurs (mild TR)  Rhythm:Regular Rate:Normal     Neuro/Psych neg Seizures negative neurological ROS     GI/Hepatic neg GERD  ,(+)     substance abuse  alcohol use, Alcohol-induced fatty liver, h/o encephalopathy and withdrawal   Endo/Other  diabetes, Type 2, Oral Hypoglycemic AgentsHypothyroidism   Renal/GU      Musculoskeletal   Abdominal   Peds  Hematology  (+) Blood dyscrasia, anemia ,   Anesthesia Other Findings Tongue cancer, neuropathy   Reproductive/Obstetrics                             Anesthesia Physical  Anesthesia Plan  ASA: III  Anesthesia Plan: General   Post-op Pain Management:    Induction: Intravenous  Airway Management Planned: Oral ETT  Additional Equipment:   Intra-op Plan:   Post-operative Plan: Extubation in OR  Informed Consent: I have reviewed the patients History and Physical, chart, labs and discussed the procedure including the risks, benefits and alternatives for the proposed anesthesia with the patient or authorized representative who has indicated his/her understanding and acceptance.   Dental advisory given  Plan Discussed with:  CRNA  Anesthesia Plan Comments: (Risks of general anesthesia discussed including, but not limited to, sore throat, hoarse voice, chipped/damaged teeth, injury to vocal cords, nausea and vomiting, allergic reactions, lung infection, heart attack, stroke, and death. All questions answered.  Labs on DOS)        Anesthesia Quick Evaluation

## 2016-06-12 NOTE — Transfer of Care (Signed)
Immediate Anesthesia Transfer of Care Note  Patient: [name]  Procedure(s) Performed: Procedure(s): RE-EXCISION OF LEFT  TONGUE CANCER (Left)  Patient Location: PACU  Anesthesia Type:General  Level of Consciousness: awake, alert , oriented and patient cooperative  Airway & Oxygen Therapy: Patient Spontanous Breathing and Patient connected to nasal cannula oxygen  Post-op Assessment: Report given to RN and Post -op Vital signs reviewed and stable  Post vital signs: Reviewed and stable  Last Vitals:  Vitals:   06/12/16 0826 06/12/16 1046  BP: (!) 203/63   Pulse: 72   Resp: 20   Temp: 36.7 C (P) 36.4 C    Last Pain:  Vitals:   06/12/16 0826  TempSrc: Oral         Complications: No apparent anesthesia complications

## 2016-06-12 NOTE — Op Note (Signed)
[name], [name]            ACCOUNT NO.:  [id number]  MEDICAL RECORD NO.:  [id number]  LOCATION:  PERIO                        FACILITY:  [facility]  PHYSICIAN:  Early Chars. 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  SURGICAL PROCEDURE:  Revision left hemiglossectomy.  ANESTHESIA:  General endotracheal.  SURGEON:  Early Chars. Wilburn Cornelia, M.D.  ASSISTANT:  Jolene Provost, PA.  ESTIMATED BLOOD LOSS:  Minimal.  The patient transferred from the operating room to the recovery room in stable condition.  BRIEF HISTORY:  The patient is an 82 year old female who was referred to our office for evaluation of a large exophytic mass involving the left lateral tongue.  Biopsy showed invasive squamous cell carcinoma.  The mass measured approximately 2 x 3 cm and involved the lateral margin of the free tongue with invasion into the deeper aspect of the left hemi tongue.  A CT scan was obtained which showed no evidence of lymphadenopathy or other mass or tumor.  Given the patient's history and findings, we undertook left anterior hemiglossectomy performed on May 13, 2016.  Initial frozen section pathology of the tissue was negative for residual tumor.  On the final pathologic analysis, there was invasive tumor abutting the deep margin of resection on the primary specimen, and given the patient's history and findings, I recommended that we undertake revision stage left hemiglossectomy to reduce the risk of a recurrence of her tumor.  The risks and benefits of this procedure were discussed in detail with the patient and her family, they understood and agreed with our plan for surgery  which is scheduled on elective basis at [place].  DESCRIPTION OF PROCEDURE:  The patient was brought to the operating room and placed in supine position on the operating table.  General endotracheal anesthesia was established without difficulty.  When the patient was adequately anesthetized, she was prepped and draped and positioned.  A surgical time-out was performed for correct identification of the patient, the surgical procedure, and the left laterality of the operation.  With the patient prepared for surgery, the oral cavity was examined. The previous surgical site was well healed with minimal hypertrophic scarring.  Her oral cavity was inspected for any additional ulcers or lesions and was normal.  The surgical procedure was begun with anterior retraction of the tongue using Bovie electrocautery to demarcate the margins of previous resection.  The scar was carefully demarcated with approximately 1 cm cuff of normal tissue.  This dissection was then carried from superficial to deep, removing the entire scar and deep tongue musculature creating a left hemiglossectomy defect that extended from the midline to the left anterior floor of mouth.  The left submandibular duct was identified and this was preserved throughout the resection and reconstruction.  The anterior division of the left lingual artery was identified, divided and suture ligated.  The area of previous scar and concern was removed  in its entirety, marked for pathology and sent for permanent section.  The deep margin of resection was then sampled for frozen section analysis.  Frozen section showed benign appearing skeletal muscle consistent with the tongue tissue and no evidence of residual infiltrative cancer.  With a negative frozen section and negative margins, the defect was closed primarily with interrupted 4-0 chromic suture in a horizontal mattressing fashion advancing the primary tongue  anteriorly and suturing across the floor of mouth to close the defect.  There was no active bleeding.  The patient's oral cavity and oropharynx were irrigated and suctioned.  She was awakened from anesthetic.  She was extubated and transferred from the operating room to the recovery room in stable condition.          ______________________________ Early Chars Wilburn Cornelia, M.D.     DLS/MEDQ  D:  [id number]  T:  06/12/2016  Job:  [id number]

## 2016-06-12 NOTE — Anesthesia Postprocedure Evaluation (Signed)
Anesthesia Post Note  Patient: [name]  Procedure(s) Performed: Procedure(s) (LRB): RE-EXCISION OF LEFT  TONGUE CANCER (Left)  Patient location during evaluation: PACU Anesthesia Type: General Level of consciousness: awake and alert Pain management: pain level controlled Vital Signs Assessment: post-procedure vital signs reviewed and stable Respiratory status: spontaneous breathing, nonlabored ventilation, respiratory function stable and patient connected to nasal cannula oxygen Cardiovascular status: blood pressure returned to baseline and stable Postop Assessment: no signs of nausea or vomiting Anesthetic complications: no       Last Vitals:  Vitals:   06/12/16 1203 06/12/16 1237  BP: (!) 188/71 (!) 149/64  Pulse: 80 77  Resp: 14 15  Temp:  36.4 C    Last Pain:  Vitals:   06/12/16 1237  TempSrc: Oral  PainSc:                  [name],JAMES TERRILL

## 2016-06-12 NOTE — Op Note (Deleted)
  The note originally documented on this encounter has been moved the the encounter in which it belongs.  

## 2016-06-12 NOTE — H&P (Signed)
[name] is an 82 y.o. female.   Chief Complaint: Left tongue cancer HPI: s/p surgical excision 1 month ago with close surgical margins  Past Medical History:  Diagnosis Date  . Abdominal pain   . Blood dyscrasia    HYPOKALEMIA  . Blood transfusion   . COPD (chronic obstructive pulmonary disease) ([place])   . Dementia    pathologic aging and dementia per daughter  . Diabetes mellitus    Type II  . Heart murmur   . History of blood transfusion   . Hypertension   . Hypothyroid   . Neuropathy ([place])   . Substance abuse    ETOH with dementia and prior encephalopathy and withdrawal  . Tongue cancer ([place]) 05/13/2016  . Tricuspid regurgitation 2012   mild  . Urinary tract infection     Past Surgical History:  Procedure Laterality Date  . ABDOMINAL HYSTERECTOMY     1970's  . BREAST SURGERY Left    Lumpectomy  . COLONOSCOPY    . ESOPHAGOGASTRODUODENOSCOPY  05/17/2011   Procedure: ESOPHAGOGASTRODUODENOSCOPY (EGD);  Surgeon: Lafayette Dragon, MD;  Location: [facility] ENDOSCOPY;  Service: Endoscopy;  Laterality: N/A;  . EXCISION OF TONGUE LESION Left 05/13/2016   Procedure: WIDE LOCAL EXCISION OF LEFT LATERAL TONGUE CANCER;  Surgeon: Jerrell Belfast, MD;  Location: [facility];  Service: ENT;  Laterality: Left;  . EYE SURGERY Bilateral    Cataract  . TONGUE SURGERY  05/13/2016   WIDE LOCAL EXCISION OF LEFT LATERAL TONGUE CANCER (Left) with Local Soft Tissue Reconstruction   . TONSILLECTOMY AND ADENOIDECTOMY      Family History  Problem Relation Age of Onset  . Stroke Mother    Social History:  reports that she has been smoking Cigarettes.  She has a 15.00 pack-year smoking history. She has never used smokeless tobacco. She reports that she drinks alcohol. She reports that she does not use drugs.  Allergies:  Allergies  Allergen Reactions  . Tuberculin Tests Other (See Comments)    UNSPECIFIED REACTION  Pt is allergic to PPD (The PPD skin test is a method used to diagnose silent  (latent) tuberculosis (TB) infection. PPD stands for purified protein derivative.) Pt should receive chest x-rays instead.    Medications Prior to Admission  Medication Sig Dispense Refill  . folic acid (FOLVITE) 1 MG tablet Take 1 mg by mouth daily.    Marland Kitchen glimepiride (AMARYL) 1 MG tablet Take 1 mg by mouth daily with breakfast.    . HYDROcodone-acetaminophen (HYCET) 7.5-325 mg/15 ml solution Take 5-10 mLs by mouth every 4 (four) hours as needed for moderate pain. 473 mL 0  . latanoprost (XALATAN) 0.005 % ophthalmic solution Place 1 drop into both eyes at bedtime.    Marland Kitchen levothyroxine (SYNTHROID, LEVOTHROID) 88 MCG tablet Take 88 mcg by mouth daily before breakfast.    . lisinopril (PRINIVIL,ZESTRIL) 10 MG tablet Take 10 mg by mouth daily.    . metFORMIN (GLUCOPHAGE-XR) 500 MG 24 hr tablet Take 500 mg by mouth daily after supper.     . potassium chloride (K-DUR,KLOR-CON) 10 MEQ tablet Take 10 mEq by mouth 2 (two) times daily.    Marland Kitchen amoxicillin (AMOXIL) 250 MG/5ML suspension Take 5 mLs (250 mg total) by mouth 3 (three) times daily. 150 mL 0  . ibuprofen (ADVIL,MOTRIN) 200 MG tablet Take 200 mg by mouth every 6 (six) hours as needed for moderate pain.      No results found for this or any previous visit (from  the past 48 hour(s)). No results found.  Review of Systems  Constitutional: Negative.   HENT: Negative.     Last menstrual period 05/17/1983. Physical Exam  Constitutional: She appears well-developed.  HENT:  Healed left tongue excision site  Neck: Normal range of motion. Neck supple.  Cardiovascular: Normal rate.   Respiratory: Effort normal.     Assessment/Plan Adm for re-excision of left tongue cancer.  Lan Entsminger, MD 06/12/2016, 7:40 AM

## 2016-06-13 ENCOUNTER — Encounter (HOSPITAL_COMMUNITY): Payer: Self-pay | Admitting: Otolaryngology

## 2016-06-13 DIAGNOSIS — C029 Malignant neoplasm of tongue, unspecified: Secondary | ICD-10-CM | POA: Diagnosis not present

## 2016-06-13 LAB — GLUCOSE, CAPILLARY
GLUCOSE-CAPILLARY: 67 mg/dL (ref 65–99)
Glucose-Capillary: 149 mg/dL — ABNORMAL HIGH (ref 65–99)
Glucose-Capillary: 136 mg/dL — ABNORMAL HIGH (ref 65–99)
Glucose-Capillary: 138 mg/dL — ABNORMAL HIGH (ref 65–99)
Glucose-Capillary: 62 mg/dL — ABNORMAL LOW (ref 65–99)

## 2016-06-13 NOTE — Progress Notes (Signed)
CBG at 1635 was 62. Patient was asymptomatic. Snack was given and CBG was rechecked at 1700 = 138.

## 2016-06-13 NOTE — Discharge Summary (Signed)
Physician Discharge Summary  Patient ID: [name] MRN: [id number] DOB/AGE: 10-11-1933 82 y.o.  Admit date: 06/12/2016 Discharge date: 06/13/2016  Admission [place] [facility]  Discharge Diagnoses: same Active Problems:   Tongue cancer [facility])   Discharged Condition: good  Hospital Course: admitted for resection of tongue. Doing well POD 2 and taking fluids weell. No pain. She is talking well. D/c to home and follow up per Dr Wilburn Cornelia  Consults: None  Significant Diagnostic Studies: 0  Treatments: surgery: tongue reasection  Discharge Exam: Blood pressure (!) 151/70, pulse 63, temperature 97.9 F (36.6 C), temperature source Oral, resp. rate 16, height 4\' 9"  (1.448 m), weight 48.5 kg (107 lb), last menstrual period 05/17/1983, SpO2 97 %. awake alert. talking well. tongue looks excellent with no swelling or bleeding. good movement. neck without seelling or mass. l- clear. cv- regular. ext- no swelling or tenderness.  Disposition: 01-Home or Self Care  Discharge Instructions    Diet - low sodium heart healthy    Complete by:  As directed    Discharge instructions    Complete by:  As directed    Sinus Instructions: 1. Limited activity 2. Liquid and soft diet 3. May bathe and shower 4. Saline mouth rinse twice daily 5. Elevate Head of Bed   Increase activity slowly    Complete by:  As directed       Follow-up Information    SHOEMAKER, DAVID, MD In 2 weeks.   Specialty:  Otolaryngology Contact information: [address] [place] [address] [phone]           Signed: Melissa Montane 06/13/2016, 8:13 AM

## 2016-06-14 DIAGNOSIS — C029 Malignant neoplasm of tongue, unspecified: Secondary | ICD-10-CM | POA: Diagnosis not present

## 2016-06-14 LAB — GLUCOSE, CAPILLARY: Glucose-Capillary: 90 mg/dL (ref 65–99)

## 2016-06-14 NOTE — Progress Notes (Signed)
Discharge instructions gone over. Home medications discussed. Prescription given. Follow up appointment to be made. Diet, activity, and signs and symptoms of infection or worsening condition discussed. Reasons to call the doctor discussed. Oral care and completion of antibiotics emphasized. Patient verbalized understanding of instructions.

## 2016-09-15 ENCOUNTER — Other Ambulatory Visit: Payer: Self-pay | Admitting: Otolaryngology

## 2016-09-15 DIAGNOSIS — C023 Malignant neoplasm of anterior two-thirds of tongue, part unspecified: Secondary | ICD-10-CM

## 2016-09-15 DIAGNOSIS — R221 Localized swelling, mass and lump, neck: Secondary | ICD-10-CM

## 2016-09-22 ENCOUNTER — Ambulatory Visit
Admission: RE | Admit: 2016-09-22 | Discharge: 2016-09-22 | Disposition: A | Discharge status: Home / Self Care | Payer: Medicare Other | Source: Ambulatory Visit | Attending: Otolaryngology | Admitting: Otolaryngology

## 2016-09-22 DIAGNOSIS — C023 Malignant neoplasm of anterior two-thirds of tongue, part unspecified: Secondary | ICD-10-CM

## 2016-09-22 DIAGNOSIS — R221 Localized swelling, mass and lump, neck: Secondary | ICD-10-CM

## 2016-09-22 MED ORDER — IOPAMIDOL (ISOVUE-300) INJECTION 61%
75.0000 mL | Freq: Once | INTRAVENOUS | Status: AC | PRN
  Administered 2016-09-22: 75 mL via INTRAVENOUS

## 2016-10-01 ENCOUNTER — Other Ambulatory Visit (HOSPITAL_COMMUNITY): Payer: Self-pay | Admitting: Otolaryngology

## 2016-10-01 DIAGNOSIS — C770 Secondary and unspecified malignant neoplasm of lymph nodes of head, face and neck: Secondary | ICD-10-CM

## 2016-10-01 DIAGNOSIS — C023 Malignant neoplasm of anterior two-thirds of tongue, part unspecified: Secondary | ICD-10-CM

## 2016-10-02 ENCOUNTER — Encounter: Payer: Self-pay | Admitting: Radiation Oncology

## 2016-10-02 ENCOUNTER — Telehealth: Payer: Self-pay | Admitting: *Deleted

## 2016-10-02 ENCOUNTER — Encounter: Payer: Self-pay | Admitting: Hematology and Oncology

## 2016-10-02 NOTE — Telephone Encounter (Signed)
Oncology Nurse Navigator Documentation  Placed introductory call to new referral patient Ms. Megan Salon..  Introduced myself as the H&N oncology nurse navigator that works with Dr. Isidore Moos to whom she has been referred by Dr. Wilburn Cornelia.  She confirmed her understanding of referral and appt date/time of 4/25 1/30.  I informed of 4/18 12:00 appt to see Dr. Alvy Bimler, MedOnc..  I briefly explained my role as her navigator, indicated that I would be joining her during her appt next week.  She understands I am trying to have 4/24 PET rescheduled for early next week, will call her to inform.  I confirmed her understanding of [place] location, explained arrival and RadOnc registration process for appt.  I provided my contact information, encouraged her to call with questions/concerns before next week. She verbalized understanding of information provided, expressed appreciation for my call.  [name], RN, BSN, [place] Oncology Nurse [place] at [place] [phone]

## 2016-10-02 NOTE — Telephone Encounter (Signed)
Oncology Nurse Navigator Documentation  Called Ms. Hafford to inform PET has been rescheduled for 4/17 10:00 arrival [facility] Radiology. I reiterated NPO status, she voiced understanding  [name], RN, BSN, [place] [facility] Oncology Nurse [place] at [place] [phone]

## 2016-10-05 ENCOUNTER — Telehealth: Payer: Self-pay | Admitting: *Deleted

## 2016-10-05 NOTE — Progress Notes (Signed)
Nursing note couldn't be made due to late arrival by patient.
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
## 2016-10-06 ENCOUNTER — Ambulatory Visit (HOSPITAL_COMMUNITY)
Admission: RE | Admit: 2016-10-06 | Discharge: 2016-10-06 | Disposition: A | Discharge status: Home / Self Care | Payer: Medicare Other | Source: Ambulatory Visit | Attending: Otolaryngology | Admitting: Otolaryngology

## 2016-10-06 DIAGNOSIS — C023 Malignant neoplasm of anterior two-thirds of tongue, part unspecified: Secondary | ICD-10-CM | POA: Insufficient documentation

## 2016-10-06 DIAGNOSIS — M533 Sacrococcygeal disorders, not elsewhere classified: Secondary | ICD-10-CM | POA: Diagnosis not present

## 2016-10-06 DIAGNOSIS — K8020 Calculus of gallbladder without cholecystitis without obstruction: Secondary | ICD-10-CM | POA: Diagnosis not present

## 2016-10-06 DIAGNOSIS — K769 Liver disease, unspecified: Secondary | ICD-10-CM | POA: Diagnosis not present

## 2016-10-06 DIAGNOSIS — I2510 Atherosclerotic heart disease of native coronary artery without angina pectoris: Secondary | ICD-10-CM | POA: Diagnosis not present

## 2016-10-06 DIAGNOSIS — C770 Secondary and unspecified malignant neoplasm of lymph nodes of head, face and neck: Secondary | ICD-10-CM | POA: Insufficient documentation

## 2016-10-06 DIAGNOSIS — R938 Abnormal findings on diagnostic imaging of other specified body structures: Secondary | ICD-10-CM | POA: Diagnosis not present

## 2016-10-06 DIAGNOSIS — K5730 Diverticulosis of large intestine without perforation or abscess without bleeding: Secondary | ICD-10-CM | POA: Diagnosis not present

## 2016-10-06 DIAGNOSIS — I700 Atherosclerosis of aorta: Secondary | ICD-10-CM | POA: Insufficient documentation

## 2016-10-06 DIAGNOSIS — D71 Functional disorders of polymorphonuclear neutrophils: Secondary | ICD-10-CM | POA: Diagnosis not present

## 2016-10-06 DIAGNOSIS — R911 Solitary pulmonary nodule: Secondary | ICD-10-CM | POA: Diagnosis not present

## 2016-10-06 LAB — GLUCOSE, CAPILLARY: Glucose-Capillary: 165 mg/dL — ABNORMAL HIGH (ref 65–99)

## 2016-10-06 MED ORDER — FLUDEOXYGLUCOSE F - 18 (FDG) INJECTION: 5.4000 | Freq: Once | INTRAVENOUS | Status: DC | PRN

## 2016-10-07 ENCOUNTER — Encounter: Payer: Self-pay | Admitting: *Deleted

## 2016-10-07 ENCOUNTER — Encounter: Payer: Self-pay | Admitting: Hematology and Oncology

## 2016-10-07 ENCOUNTER — Ambulatory Visit
Admission: RE | Admit: 2016-10-07 | Discharge: 2016-10-07 | Disposition: A | Discharge status: Home / Self Care | Payer: Medicare Other | Source: Ambulatory Visit | Attending: Radiation Oncology | Admitting: Radiation Oncology

## 2016-10-07 ENCOUNTER — Ambulatory Visit (HOSPITAL_BASED_OUTPATIENT_CLINIC_OR_DEPARTMENT_OTHER): Payer: Medicare Other | Admitting: Hematology and Oncology

## 2016-10-07 ENCOUNTER — Encounter: Payer: Self-pay | Admitting: Radiation Oncology

## 2016-10-07 VITALS — BP 166/80 | HR 88 | Temp 98.6°F | Ht <= 58 in | Wt 108.8 lb

## 2016-10-07 VITALS — BP 162/81 | HR 77 | Temp 98.6°F | Resp 18 | Ht <= 58 in | Wt 108.8 lb

## 2016-10-07 DIAGNOSIS — C023 Malignant neoplasm of anterior two-thirds of tongue, part unspecified: Secondary | ICD-10-CM | POA: Insufficient documentation

## 2016-10-07 DIAGNOSIS — Z720 Tobacco use: Secondary | ICD-10-CM

## 2016-10-07 DIAGNOSIS — G893 Neoplasm related pain (acute) (chronic): Secondary | ICD-10-CM

## 2016-10-07 DIAGNOSIS — F0281 Dementia in other diseases classified elsewhere with behavioral disturbance: Secondary | ICD-10-CM

## 2016-10-07 DIAGNOSIS — C029 Malignant neoplasm of tongue, unspecified: Secondary | ICD-10-CM

## 2016-10-07 DIAGNOSIS — Z7189 Other specified counseling: Secondary | ICD-10-CM

## 2016-10-07 DIAGNOSIS — E119 Type 2 diabetes mellitus without complications: Secondary | ICD-10-CM | POA: Diagnosis not present

## 2016-10-07 DIAGNOSIS — Z823 Family history of stroke: Secondary | ICD-10-CM | POA: Diagnosis not present

## 2016-10-07 DIAGNOSIS — F02818 Dementia in other diseases classified elsewhere, unspecified severity, with other behavioral disturbance: Secondary | ICD-10-CM

## 2016-10-07 DIAGNOSIS — G63 Polyneuropathy in diseases classified elsewhere: Secondary | ICD-10-CM

## 2016-10-07 MED ORDER — ACETAMINOPHEN 325 MG PO TABS: 325.0000 mg | ORAL_TABLET | Freq: Once | ORAL | Status: DC

## 2016-10-07 NOTE — Progress Notes (Addendum)
Radiation Oncology         [phone] ________________________________  Initial outpatient Consultation  Name: [name] MRN: [id number]  Date: 10/07/2016  DOB: 06-02-1934  CB:ULAGTXM,IWOEHO Jenetta Downer, MD  Jerrell Belfast, MD   REFERRING PHYSICIAN: Jerrell Belfast, MD  DIAGNOSIS: Cancer of anterior two-thirds of tongue.  C02.3 Cancer Staging Cancer of anterior two-thirds of tongue ([place]) Staging form: Lip and Oral Cavity, AJCC 8th Edition - Clinical: Stage IVB (cT2, cN3b, cM0) - Signed by Heath Lark, MD on 10/07/2016   CHIEF COMPLAINT: Here to  discuss management of left tongue cancer - recurrent in neck  HISTORY OF PRESENT ILLNESS::[name] is a 83 y.o. 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 Biopsy of the tongue on 05/13/16 revealed invasive squamous cell carcinoma, well differentiated, spanning 3.7 cm. The patient subsequently underwent hemiglossectomy, performed on 06/12/16 by Dr. Wilburn Cornelia; no neck dissection. Preoperative CT scan of the neck showed no adenopathy and no neck dissection was performed. The patient was seen in follow up with Dr. Wilburn Cornelia on 09/07/16 with new onset bilateral lymphadenopathy. I reviewed path and imaging today at tumor board w/ ENT team.  Pertinent imaging thus far includes CT chest on 09/22/16 which revealed a 4 mm left upper lobe partially calcified pulmonary nodule, suspect for granuloma. There is an indeterminate 14 mm mass in the left adrenal gland. CT soft tissue neck on the same day revealed new bulky necrotic lymphadenopathy. Left IJ chain node measures up to 53 mm and invades the IJ with tumor thrombus extending to the subclavian confluence. This node also invades the supraglottic larynx and left sternocleidomastoid. Additionally noted was the oral tongue primary with possible residual/recurrent tumor along the right margin.  PET reveals no hypermetabolism in tongue, but certainly in neck  masses.  The patient reports to the clinic today, accompanied by her daughter, to discuss the role that radiation may play in the treatment of her disease. We are joined today by [name], RN, Head and Neck Nurse Navigator.  On review of systems, the patient reports feeling that swelling in her neck alternates sides in the mornings and evenings. She reports pain in her mouth and throat when eating; she must rinse food particles out of her mouth after eating, take Tylenol, and lay down to alleviate discomfort. Additionally, the patient reports a history of thyroid cancer which concerns her in light of this new cancer.  Of note, the patient most recently saw Dr. Wilburn Cornelia on 09/29/16. Per the encounter note, Dr. Wilburn Cornelia recommends initiating therapy with possible chemoradiation, given the rapid progression of the disease.  Surgery would be extremely morbid at this time, per our discussion.  The patient is scheduled to consult with Dr. Alvy Bimler today. She missed her appt w/ her earlier today due to certain stressors.  PREVIOUS RADIATION THERAPY: No EBRT - except possibly RAI - radioactive iodine pill  PAST MEDICAL HISTORY:  has a past medical history of Abdominal pain; Blood dyscrasia; Blood transfusion; COPD (chronic obstructive pulmonary disease) ([place]); Dementia; Diabetes mellitus; Heart murmur; History of blood transfusion; Hypertension; Hypothyroid; Neuropathy; Substance abuse; Tongue cancer ([place]) (05/13/2016); Tricuspid regurgitation (2012); and Urinary tract infection.    PAST SURGICAL HISTORY: Past Surgical History:  Procedure Laterality Date  . ABDOMINAL HYSTERECTOMY     1970's  . BREAST SURGERY Left    Lumpectomy  . COLONOSCOPY    . ESOPHAGOGASTRODUODENOSCOPY  05/17/2011   Procedure: ESOPHAGOGASTRODUODENOSCOPY (EGD);  Surgeon: Lafayette Dragon,  MD;  Location: [facility];  Service: Endoscopy;  Laterality: N/A;  . EXCISION OF TONGUE LESION Left 05/13/2016   Procedure: WIDE LOCAL  EXCISION OF LEFT LATERAL TONGUE CANCER;  Surgeon: Jerrell Belfast, MD;  Location: [facility];  Service: ENT;  Laterality: Left;  . EXCISION OF TONGUE LESION Left 06/12/2016   Procedure: RE-EXCISION OF LEFT  TONGUE CANCER;  Surgeon: Jerrell Belfast, MD;  Location: [facility];  Service: ENT;  Laterality: Left;  . EYE SURGERY Bilateral    Cataract  . TONGUE SURGERY  05/13/2016   WIDE LOCAL EXCISION OF LEFT LATERAL TONGUE CANCER (Left) with Local Soft Tissue Reconstruction   . TONSILLECTOMY AND ADENOIDECTOMY      FAMILY HISTORY: family history includes Stroke in her mother.  SOCIAL HISTORY:  reports that she has been smoking Cigarettes.  She has a 15.00 pack-year smoking history. She has never used smokeless tobacco. She reports that she drinks alcohol. She reports that she does not use drugs. The patient lives alone in her own home. Her daughter lives in [place], [place]. The patient is a current 1/2 ppd smoker. She is a social drinker, though she reports she has only had a few drinks in the last few years.   ALLERGIES: Tuberculin tests  MEDICATIONS:  Current Outpatient Prescriptions  Medication Sig Dispense Refill  . glimepiride (AMARYL) 1 MG tablet Take 1 mg by mouth daily with breakfast.    . ibuprofen (ADVIL,MOTRIN) 200 MG tablet Take 200 mg by mouth every 6 (six) hours as needed for moderate pain.    Marland Kitchen latanoprost (XALATAN) 0.005 % ophthalmic solution Place 1 drop into both eyes at bedtime.    Marland Kitchen levothyroxine (SYNTHROID, LEVOTHROID) 88 MCG tablet Take 88 mcg by mouth daily before breakfast.    . lisinopril (PRINIVIL,ZESTRIL) 10 MG tablet Take 10 mg by mouth daily.    . metFORMIN (GLUCOPHAGE-XR) 500 MG 24 hr tablet Take 500 mg by mouth daily after supper.     . folic acid (FOLVITE) 1 MG tablet Take 1 mg by mouth daily.    Marland Kitchen HYDROcodone-acetaminophen (HYCET) 7.5-325 mg/15 ml solution Take 10-15 mLs by mouth every 4 (four) hours as needed for moderate pain. (Patient not taking: Reported on 10/07/2016)  473 mL 0  . potassium chloride (K-DUR,KLOR-CON) 10 MEQ tablet Take 10 mEq by mouth 2 (two) times daily.     No current facility-administered medications for this encounter.    Facility-Administered Medications Ordered in Other Encounters  Medication Dose Route Frequency Provider Last Rate Last Dose  . fludeoxyglucose F - 18 (FDG) injection 5.4 millicurie  5.4 millicurie Intravenous Once PRN Kerby Moors, MD        REVIEW OF SYSTEMS:  Notable for that above.   PHYSICAL EXAM:  height is 4\' 9"  (1.448 m) and weight is 108 lb 12.8 oz (49.4 kg). Her temperature is 98.6 F (37 C). Her blood pressure is 166/80 (abnormal) and her pulse is 88. Her oxygen saturation is 100%.   General: Alert and oriented, in no acute distress. HEENT: Extraocular movements are intact. Oropharynx is notable for a deficit in the left anterior tongue which is postsurgical in nature. There does not seem to be any sign of recurrence on the tongue. She does not have any upper teeth, though she has some lower teeth in sub-optimal condition. Mucous membranes are moist. Neck: Visible bulky adenopathy on both sides of neck, firm. Dominant mass in right neck in level 1b/level2 region, approximately 4-5 cm in greatest dimension. In the  left neck she has mass approximately 7 cm in greatest dimension at level 2/3. Heart: Regular in rate and rhythm. Soft systolic murmur loudest at the aortic region.  Chest: Coarse breath sounds bilaterally, no rales. Abdomen: Soft, non tender. Extremities: No edema. Lymphatics: see Neck Exam Skin: No concerning lesions. Neurologic: No obvious focalities. Speech is fluent. Coordination is intact. Psychiatric: Judgment and insight are intact. Affect is appropriate.  ECOG = 2   LABORATORY DATA:  Lab Results  Component Value Date   WBC 11.0 (H) 06/12/2016   HGB 11.5 (L) 06/12/2016   HCT 35.4 (L) 06/12/2016   MCV 83.3 06/12/2016   PLT 197 06/12/2016   CMP     Component Value Date/Time   NA  142 06/05/2016 1320   K 3.6 06/05/2016 1320   CL 110 06/05/2016 1320   CO2 23 06/05/2016 1320   GLUCOSE 160 (H) 06/05/2016 1320   BUN 8 06/05/2016 1320   CREATININE 0.66 06/12/2016 1244   CALCIUM 9.4 06/05/2016 1320   PROT 7.1 02/03/2013 0450   ALBUMIN 2.5 (L) 02/03/2013 0450   AST 73 (H) 02/03/2013 0450   ALT 27 02/03/2013 0450   ALKPHOS 89 02/03/2013 0450   BILITOT 2.9 (H) 02/03/2013 0450   GFRNONAA >60 06/12/2016 1244   GFRAA >60 06/12/2016 1244         RADIOGRAPHY: Ct Soft Tissue Neck W Contrast  Result Date: 09/22/2016 CLINICAL DATA:  History of tongue cancer.  Neck swelling. EXAM: CT NECK WITH CONTRAST TECHNIQUE: Multidetector CT imaging of the neck was performed using the standard protocol following the bolus administration of intravenous contrast. Creatinine was obtained on site at [facility] at [address] Results: Creatinine 0.6 mg/dL. CONTRAST:  50mL ISOVUE-300 IOPAMIDOL (ISOVUE-300) INJECTION 61% COMPARISON:  04/22/2016 FINDINGS: Pharynx and larynx: Status post resection of oral tongue cancer. There is nodular enhancement along the right aspect of the surgical margin measuring up to 10 mm. Salivary glands: The right submandibular gland is posteriorly displaced in the left submandibular gland is anteriorly displaced by bulky necrotic adenopathy. No primary disease suspected. The right submandibular gland may be invaded. Thyroid: Predominately calcified right-sided nodule. Subcentimeter left-sided nodule. Lymph nodes: New necrotic lymphadenopathy with right submandibular node measuring 4 cm and showing gross extracapsular tumor. Left cervical chain node measures 53 mm and also has extensive extracapsular tumor with IJ invasion causing tumor thrombus to the level of the subclavian confluence. This node also infiltrates the left supraglottic larynx pharynx (definitely into strap muscles and reaching the paraglottic fat) and sternocleidomastoid. Smaller necrotic right  level 2 lymph node. Vascular: Left IJ invasion and tumor thrombus as above. Limited intracranial: Negative Visualized orbits: Not seen Mastoids and visualized paranasal sinuses: Clear Skeleton: Right submandibular adenopathy contacts the mandibular body, without invasion. No hematogenous osseous metastasis identified. Upper chest: Reported separately Other: Subcutaneous reticulation in the neck which could be related to radiotherapy or venous obstruction. These results will be called to the ordering clinician or representative by the Radiologist Assistant, and communication documented in the PACS or zVision Dashboard. IMPRESSION: 1. New bulky bilateral necrotic lymphadenopathy. Left IJ chain node measures up to 53 mm and invades the IJ with tumor thrombus extending to the subclavian confluence. This node also invades the supraglottic larynx and left sternocleidomastoid. 2. Oral tongue primary with possible residual/recurrent tumor along the right margin. Electronically Signed   By: Monte Fantasia M.D.   On: 09/22/2016 14:35   Ct Chest W Contrast  Result Date: 09/22/2016 CLINICAL  DATA:  Neck mass EXAM: CT CHEST WITH CONTRAST TECHNIQUE: Multidetector CT imaging of the chest was performed during intravenous contrast administration. CONTRAST:  43mL ISOVUE-300 IOPAMIDOL (ISOVUE-300) INJECTION 61% COMPARISON:  04/22/2016, neck CT 09/22/2016 FINDINGS: Cardiovascular: Atherosclerotic vascular calcification of the aorta. No aneurysmal dilatation. Irregular mural thrombus in the descending thoracic aorta. Thrombus present within the left internal jugular vein to the subclavian confluence. Coronary artery calcification. Trace pericardial effusion. Mild cardiomegaly. Mediastinum/Nodes: Midline trachea. Multiple coarse calcifications in the right lobe of the thyroid with 17 mm peripherally calcified mass in the right lobe. Subcentimeter hypodense nodule left lobe of the thyroid. Ill-defined soft tissue density and stranding  at the base of left neck and around the sternocleidomastoid muscle. Esophagus grossly unremarkable. No axillary adenopathy. No significant mediastinal or hilar adenopathy. Lungs/Pleura: Partially calcified 4 mm pulmonary nodule in the left upper lobe, suspect granuloma. No other nodules are visualized. No consolidation or pleural effusion. Upper Abdomen: No focal hepatic abnormalities in the imaged liver. Calcified stones in the gallbladder. Splenic granuloma. 14 mm indeterminate mass in the left adrenal gland. Slightly narrowed appearance of the upper abdominal aorta with atherosclerosis. Musculoskeletal: Degenerative changes. No acute or suspicious bone lesions are visualized. IMPRESSION: 1. 4 mm left upper lobe partially calcified pulmonary nodule, suspect granuloma. No other pulmonary nodules are visualized. No significant mediastinal or hilar adenopathy. 2. Indeterminate 14 mm mass in the left adrenal gland. Further evaluation with adrenal CT or MRI may be obtained for further evaluation. 3. Thrombosis of the left internal jugular vein. Ill-defined soft tissue thickening and stranding anterior to the left jugular vein and around the inferior left sternocleidomastoid muscle. 4. Gallstones Electronically Signed   By: Donavan Foil M.D.   On: 09/22/2016 15:22   Nm Pet Image Initial (pi) Skull Base To Thigh  Addendum Date: 10/06/2016   ADDENDUM REPORT: 10/06/2016 14:08 ADDENDUM: The original report was by Dr. Van Clines. The following addendum is by Dr. Van Clines: I discussed these findings by telephone with Dr. Wilburn Cornelia at 1:50 p.m. on 10/06/2016. Electronically Signed   By: Van Clines M.D.   On: 10/06/2016 14:08   Result Date: 10/06/2016 CLINICAL DATA:  Initial treatment strategy for tongue cancer with lymph node metastatic disease. EXAM: NUCLEAR MEDICINE PET SKULL BASE TO THIGH TECHNIQUE: 5.4 mCi F-18 FDG was injected intravenously. Full-ring PET imaging was performed from the skull  base to thigh after the radiotracer. CT data was obtained and used for attenuation correction and anatomic localization. FASTING BLOOD GLUCOSE:  Value: 165 mg/dl COMPARISON:  Multiple exams, including 09/22/2016 and 05/27/2011 CT scans FINDINGS: NECK A left cervical node spanning levels IIa, IIb, and III measures 6.8 cm in short axis and has a necrotic center with hypermetabolic rim, maximum SUV 17.1. This distorts and displaces the left lateral limb of the hyoid bone. There appears to be some less certain nodularity/adenopathy along the upper margin of this lesion which extends from tangential to the mandible down to tangential to the clavicle. Just below the angle of the right mandible there is a level Ib lymph node measuring 4.2 cm in short axis with maximum SUV 19.1. The medial margin of this lesion abuts the right tongue base in the posteromedial margin abuts the right side of the hyoid bone. Aside from this mass along the right side of the tongue base, I do not see a separate hypermetabolic tongue mass. A right station IIa lymph node measuring 1.8 cm in diameter has a maximum SUV of 15.1. The combination  of this bulky adenopathy significantly narrows the supraglottic trachea, causing a slit like appearance for example on image 28/4. 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 Low-density left internal jugular vein is severely effaced by the massive left neck adenopathy and probably thrombosed as well. Today' s exam was not performed with IV contrast and accordingly venous assessment is problematic. CHEST No hypermetabolic mediastinal or hilar nodes. 4 mm left upper lobe nodule image 15/8, not currently hypermetabolic, no change from 47/65/4650. Coronary, aortic arch, and branch vessel atherosclerotic vascular disease. ABDOMEN/PELVIS No abnormal hypermetabolic activity within the liver, pancreas, adrenal glands, or spleen. No hypermetabolic lymph nodes in the abdomen or pelvis.  Cholelithiasis. Hepatic morphology is somewhat lobular in raises the possibility of cirrhosis, correlate with clinical history. Trace perihepatic fluid along the right hepatic lobe for example image 102/4. No splenomegaly but there are splenic calcifications suggesting old granulomatous disease. Faint calcification along the slightly nodular left adrenal gland common no change from 3546, no hypermetabolic activity, thought to likely be benign. Aortoiliac atherosclerotic vascular disease. Sigmoid colon diverticulosis without active diverticulitis. Pelvic floor laxity. Hypermetabolic activity along the perineum is most compatible with incontinence. Trace amount of free pelvic fluid. Appendix normal. SKELETON No focal hypermetabolic activity to suggest skeletal metastasis. Prominent sclerosis and lucency along the sacroiliac joints compatible with considerable chronic sacroiliitis, but without current hypermetabolic activity. IMPRESSION: 1. Bulky hypermetabolic bilateral neck adenopathy compatible with malignancy. These nodes are so large that they significantly narrow the supraglottic airway and could threaten the airway if further swelling or nodal enlargement occurs. I do not see a separate tongue mass (the original density along the left anterior tongue shown on 04/22/2016 is no longer seen) although the dominant right level Ib lymph node abuts the right tongue base. 2. 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 3. Hepatic morphology raises the possibility of cirrhosis, correlate with clinical history. Trace ascites. 4. 4 mm left upper lobe nodule is not changed from 09/22/2016, is not hypermetabolic but is below sensitive PET-CT size thresholds. 5. Other imaging findings of potential clinical significance: Coronary, aortic arch, and branch vessel atherosclerotic vascular  disease. Aortoiliac atherosclerotic vascular disease. Cholelithiasis. Old granulomatous disease. Sigmoid diverticulosis. Pelvic floor laxity. Perineal activity compatible with urinary incontinence. Considerable chronic sacroiliitis. Radiology assistant personnel have been notified to put me in telephone contact with the referring physician or the referring physician's clinical representative in order to discuss these findings. Once this communication is established I will issue an addendum to this report for documentation purposes. Electronically Signed: By: Van Clines M.D. On: 10/06/2016 13:34      IMPRESSION/PLAN: This is a delightful patient with recurrent head and neck cancer. This is almost definitely recurrence of tongue cancer with rapid onset- her thyroid cancer was many years ago. I would recommend radiotherapy for this patient. She may be a candidate for induction chemotherapy, first, as discussed at tumor board today.  ChRT concurrently would likely be poorly tolerated. Surgery may be feasible to clear residual disease after induction chemo and radiotherapy, if she doesn't develop distant mets.  I explained to patient and daughter that  her prognosis is guarded and she is at high risk for distant failure.   We discussed the potential risks, benefits, and side effects of radiotherapy. We talked in detail about acute and late effects. We discussed that some of the most bothersome acute  effects may be mucositis, dysgeusia, salivary changes, skin irritation, hair loss, dehydration, weight loss and fatigue. We talked about late effects which include but are not necessarily limited to dysphagia, hypothyroidism, nerve injury, spinal cord injury, xerostomia, trismus, and neck edema. We discussed that if chemotherapy is offered, radiation therapy will take place afterwards. No guarantees of treatment were given. A consent form was signed and placed in the patient's medical record. The patient is  enthusiastic about proceeding with treatment. I look forward to this patient's referral back to me for further participation in her care.    Today we discussed smoking cessation. I encouraged the patient to stop smoking in order to give her the best chance at recovery. I discussed nicotine patches or gum for smoking cessation; the patient has declined a prescription at this time and prefers to try OTC smoking cessation on her own first. She has agreed to try to reduce her smoking every day. The patient is not motivated to follow through with complete smoking cessation at this time.   The patient is scheduled to consult with Dr. Alvy Bimler today.  I will hold off on RT planning until she is released by medical oncology, presuming chemotherapy will be attempted first as inductive treatment. Patient/daughter understands that if she elects not to pursue chemotherapy, I recommend we start RT without excessive delay.    __________________________________________   Eppie Gibson, MD  This document serves as a record of services personally performed by Eppie Gibson, MD. It was created on her behalf by Maryla Morrow, a trained medical scribe. The creation of this record is based on the scribe's personal observations and the provider's statements to them. This document has been checked and approved by the attending provider.

## 2016-10-08 ENCOUNTER — Other Ambulatory Visit (HOSPITAL_COMMUNITY): Payer: Medicare Other | Admitting: Dentistry

## 2016-10-08 ENCOUNTER — Telehealth (HOSPITAL_COMMUNITY): Payer: Self-pay | Admitting: Dentistry

## 2016-10-08 DIAGNOSIS — F0280 Dementia in other diseases classified elsewhere without behavioral disturbance: Secondary | ICD-10-CM | POA: Insufficient documentation

## 2016-10-08 DIAGNOSIS — G893 Neoplasm related pain (acute) (chronic): Secondary | ICD-10-CM | POA: Insufficient documentation

## 2016-10-08 DIAGNOSIS — Z7189 Other specified counseling: Secondary | ICD-10-CM | POA: Insufficient documentation

## 2016-10-08 DIAGNOSIS — G63 Polyneuropathy in diseases classified elsewhere: Secondary | ICD-10-CM | POA: Insufficient documentation

## 2016-10-08 DIAGNOSIS — Z720 Tobacco use: Secondary | ICD-10-CM | POA: Insufficient documentation

## 2016-10-08 NOTE — Telephone Encounter (Signed)
10/08/16  Pt's daughter, Latina Craver called and left msg. cancelling Dental Consult w/Dr. Enrique Sack for 10/08/16 @ 1:00 due to powerlines down in pt's neighbor. Will call and reschedule at a later date.  LRI

## 2016-10-08 NOTE — Assessment & Plan Note (Signed)
I spent some time counseling the patient the importance of tobacco cessation. We discussed common tobacco cessation strategies She is interested to quit

## 2016-10-08 NOTE — Progress Notes (Signed)
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 female is here because of recent diagnosis of recurrent tongue cancer with regional lymph node metastasis The patient have extensive past medical history of dementia, renal failure, severe substance abuse and significant illness in 2012 and 2014. I have reviewed her medical records extensively. After her last hospitalization, the patient was moved into a dementia care facility, subsequently to a nursing home and recently relocated back to her home after gradual improvement of some of her medical issues. In regards to her dementia, she appears to be coping well but occasionally does appear to be aggressive towards her daughter, which was well documented in prior visits to her neurologist. The patient also appears to be in denial in related to some of her health issues in the past.  According to the patient, the first initial presentation was due to an anterior tongue lesion.  I review her records extensively and summarized as follows:   Tongue cancer ([place])   04/15/2016 Initial Diagnosis    The patient presents for evaluation of a left anterior tongue ulcer. She reports a several month history of gradually enlarging ulcerated lesion on the left anterior tongue, pain when chewing. She reports some intermittent bleeding and irritation. The patient reports weight loss over the last several months with difficulty chewing       04/22/2016 Imaging    CT neck: 1. 17 x 10 x 20 mm hyperdense mass lesion  in the left anterior tongue associated with the area of ulceration. 2. No significant invasion of the deep tongue musculature or floor of mouth. 3. No significant metastatic disease. 4. Degenerative changes within the cervical spine, worse on the right. 5. Calcified right thyroid nodule. Recommend further evaluation with thyroid ultrasound. If patient is clinically hyperthyroid, consider nuclear medicine thyroid uptake and scan.       05/14/2016 Surgery    Wide local excision, anterior 2/3 left tongue with reconstruction.      05/14/2016 Pathology Results    1. Tongue, biopsy, Anterior deep margin left - BENIGN SALIVARY GLAND-TYPE TISSUE AND SKELETAL MUSCLE. - THERE IS NO EVIDENCE OF MALIGNANCY. 2. Tongue, resection for tumor, Left lateral - INVASIVE SQUAMOUS CELL CARCINOMA, WELL DIFFERENTIATED, SPANNING 3.7 CM. - INVASIVE CARCINOMA IS BROADLY PRESENT AT THE DEEP MARGIN OF SPECIMEN #2. - PERINEURAL INVASION IS IDENTIFIED. - SEE ONCOLOGY TABLE BELOW.      06/12/2016 Surgery    Revision left hemiglossectomy      06/12/2016 Pathology Results    1. Tongue, resection for tumor, left lateral - BENIGN SQUAMOUS LINED MUCOSA WITH FOREIGN BODY GIANT CELL REACTION, CONSISTENT WITH PRIOR SURGICAL PROCEDURE. - THERE IS NO EVIDENCE OF MALIGNANCY. 2. Tongue, biopsy, deep margin, left - BENIGN SKELETAL MUSCLE. - THERE IS NO EVIDENCE OF MALIGNANCY.      09/22/2016 Imaging    Ct chest: 1. 4 mm left upper lobe partially calcified pulmonary nodule, suspect granuloma. No other pulmonary nodules are visualized. No significant mediastinal or hilar adenopathy. 2. Indeterminate 14 mm mass in the left adrenal gland. Further evaluation with adrenal CT or MRI may be  obtained for further evaluation. 3. Thrombosis of the left internal jugular vein. Ill-defined soft tissue thickening and stranding anterior to the left jugular vein and around the inferior left sternocleidomastoid muscle. 4. Gallstones       09/22/2016 Imaging    CT neck: . 1. New bulky bilateral necrotic lymphadenopathy. Left IJ chain node measures up to 53 mm and invades the IJ with tumor thrombus extending to the subclavian confluence. This node also invades the supraglottic larynx and left sternocleidomastoid. 2. Oral tongue primary with possible residual/recurrent tumor along the right margin.       10/06/2016 PET scan    1. Bulky hypermetabolic bilateral neck adenopathy compatible with malignancy. These nodes are so large that they significantly narrow the supraglottic airway and could threaten the airway if further swelling or nodal enlargement occurs. I do not see a separate tongue mass (the original density along the left anterior tongue shown on 04/22/2016 is no longer seen) although the dominant right level Ib lymph node abuts the right tongue base. 2. 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 3. Hepatic morphology raises the possibility of cirrhosis, correlate with clinical history. Trace ascites. 4. 4 mm left upper lobe nodule is not changed from 09/22/2016, is not hypermetabolic but is below sensitive PET-CT size thresholds. 5. Other imaging findings of potential clinical significance:  Coronary, aortic arch, and branch vessel atherosclerotic vascular disease. Aortoiliac atherosclerotic vascular disease. Cholelithiasis. Old granulomatous disease. Sigmoid diverticulosis. Pelvic floor laxity. Perineal activity compatible with urinary incontinence. Considerable chronic sacroiliitis      she denies any hearing deficit. She has mild difficulties with chewing food but denies swallowing difficulties, painful swallowing, changes in the quality of her voice or abnormal weight loss. She has some mild pain in her tongue after surgery, well controlled with Tylenol Her risk factors included prior history  of significant alcoholism and smoking.  According to the patient, she has attempted to quit smoking  MEDICAL HISTORY:  Past Medical History:  Diagnosis Date  . Abdominal pain   . Blood dyscrasia    HYPOKALEMIA  . Blood transfusion   . COPD (chronic obstructive pulmonary disease) ([place])   . Dementia    pathologic aging and dementia per daughter  . Diabetes mellitus    Type II  . Heart murmur   . History of blood transfusion   . Hypertension   . Hypothyroid   . Neuropathy   . Substance abuse    ETOH with dementia and prior encephalopathy and withdrawal  . Tongue cancer ([place]) 05/13/2016  . Tricuspid regurgitation 2012   mild  . Urinary tract infection     SURGICAL HISTORY: Past Surgical History:  Procedure Laterality Date  . ABDOMINAL HYSTERECTOMY     1970's  . BREAST SURGERY Left    Lumpectomy  . COLONOSCOPY    . ESOPHAGOGASTRODUODENOSCOPY  05/17/2011   Procedure: ESOPHAGOGASTRODUODENOSCOPY (EGD);  Surgeon: Lafayette Dragon, MD;  Location[facility] END[facility]COPY;  Service: Endoscopy;  Laterality: N/A;  . EXCISION OF TONGUE LESION Left 05/13/2016   Procedure: WIDE LOCAL EXCISION OF LEFT LATERAL TONGUE CANCER;  Surgeon: Jerrell Belfast, MD;  Location: [place];  Service: ENT;  Laterality: Left;  . EXCISION OF TONGUE LESION Left 06/12/2016   Procedure: RE-EXCISION OF LEFT  TONGUE CANCER;  Surgeon: Jerrell Belfast, MD;  Location: [place];  Service: ENT;  Laterality: Left;  . EYE SURGERY Bilateral  Cataract  . TONGUE SURGERY  05/13/2016   WIDE LOCAL EXCISION OF LEFT LATERAL TONGUE CANCER (Left) with Local Soft Tissue Reconstruction   . TONSILLECTOMY AND ADENOIDECTOMY      SOCIAL HISTORY: Social History   Social History  . Marital status: Divorced    Spouse name: N/A  . Number of children: 2  . Years of education: N/A   Occupational History  . retired Marine scientist    Social History Main Topics  . Smoking status: Current Every Day Smoker    Packs/day: 0.50    Years: 30.00    Types:  Cigarettes  . Smokeless tobacco: Never Used  . Alcohol use Yes     Comment: social  . Drug use: No  . Sexual activity: No   Other Topics Concern  . Not on file   Social History Narrative  . No narrative on file    FAMILY HISTORY: Family History  Problem Relation Age of Onset  . Stroke Mother     ALLERGIES:  is allergic to tuberculin tests.  MEDICATIONS:  Current Outpatient Prescriptions  Medication Sig Dispense Refill  . folic acid (FOLVITE) 1 MG tablet Take 1 mg by mouth daily.    Marland Kitchen glimepiride (AMARYL) 1 MG tablet Take 1 mg by mouth daily with breakfast.    . ibuprofen (ADVIL,MOTRIN) 200 MG tablet Take 200 mg by mouth every 6 (six) hours as needed for moderate pain.    Marland Kitchen latanoprost (XALATAN) 0.005 % ophthalmic solution Place 1 drop into both eyes at bedtime.    Marland Kitchen levothyroxine (SYNTHROID, LEVOTHROID) 88 MCG tablet Take 88 mcg by mouth daily before breakfast.    . lisinopril (PRINIVIL,ZESTRIL) 10 MG tablet Take 10 mg by mouth daily.    . metFORMIN (GLUCOPHAGE-XR) 500 MG 24 hr tablet Take 500 mg by mouth daily after supper.     . potassium chloride (K-DUR,KLOR-CON) 10 MEQ tablet Take 10 mEq by mouth 2 (two) times daily.    Marland Kitchen HYDROcodone-acetaminophen (HYCET) 7.5-325 mg/15 ml solution Take 10-15 mLs by mouth every 4 (four) hours as needed for moderate pain. (Patient not taking: Reported on 10/07/2016) 473 mL 0   Current Facility-Administered Medications  Medication Dose Route Frequency Provider Last Rate Last Dose  . acetaminophen (TYLENOL) tablet 325 mg  325 mg Oral Once Heath Lark, MD       Facility-Administered Medications Ordered in Other Visits  Medication Dose Route Frequency Provider Last Rate Last Dose  . fludeoxyglucose F - 18 (FDG) injection 5.4 millicurie  5.4 millicurie Intravenous Once PRN Kerby Moors, MD        REVIEW OF SYSTEMS:   Constitutional: Denies fevers, chills or abnormal night sweats Eyes: Denies blurriness of vision, double vision or watery  eyes Ears, nose, mouth, throat, and face: Denies mucositis or sore throat Respiratory: Denies cough, dyspnea or wheezes Cardiovascular: Denies palpitation, chest discomfort or lower extremity swelling Gastrointestinal:  Denies nausea, heartburn or change in bowel habits Skin: Denies abnormal skin rashes Neurological:Denies numbness, tingling or new weaknesses Behavioral/Psych: Mood is stable, no new changes  All other systems were reviewed with the patient and are negative.  PHYSICAL EXAMINATION: ECOG PERFORMANCE STATUS: 1 - Symptomatic but completely ambulatory  Vitals:   10/07/16 1429  BP: (!) 162/81  Pulse: 77  Resp: 18  Temp: 98.6 F (37 C)   Filed Weights   10/07/16 1429  Weight: 108 lb 12.8 oz (49.4 kg)    GENERAL:alert, no distress and comfortable SKIN: skin color, texture, turgor are  normal, no rashes or significant lesions EYES: normal, conjunctiva are pink and non-injected, sclera clear OROPHARYNX: Noted tongue resection changes  NECK: She has extensive bilateral neck lymphadenopathy almost obscuring her thyroid region  LYMPH: She has no other palpable lymphadenopathy elsewhere except in the neck LUNGS: clear to auscultation and percussion with normal breathing effort HEART: regular rate & rhythm and no murmurs and no lower extremity edema ABDOMEN:abdomen soft, non-tender and normal bowel sounds Musculoskeletal:no cyanosis of digits and no clubbing  PSYCH: alert & oriented x 3 with fluent speech, appears verbally aggressive towards her daughter NEURO: no focal motor/sensory deficits  LABORATORY DATA:  I have reviewed the data as listed Lab Results  Component Value Date   WBC 11.0 (H) 06/12/2016   HGB 11.5 (L) 06/12/2016   HCT 35.4 (L) 06/12/2016   MCV 83.3 06/12/2016   PLT 197 06/12/2016   Lab Results  Component Value Date   NA 142 06/05/2016   K 3.6 06/05/2016   CL 110 06/05/2016   CO2 23 06/05/2016    RADIOGRAPHIC STUDIES: I have personally  reviewed the radiological images as listed and agreed with the findings in the report. Ct Soft Tissue Neck W Contrast  Result Date: 09/22/2016 CLINICAL DATA:  History of tongue cancer.  Neck swelling. EXAM: CT NECK WITH CONTRAST TECHNIQUE: Multidetector CT imaging of the neck was performed using the standard protocol following the bolus administration of intravenous contrast. Creatinine was obtained on site at [facility] at [address] Results: Creatinine 0.6 mg/dL. CONTRAST:  36mL ISOVUE-300 IOPAMIDOL (ISOVUE-300) INJECTION 61% COMPARISON:  04/22/2016 FINDINGS: Pharynx and larynx: Status post resection of oral tongue cancer. There is nodular enhancement along the right aspect of the surgical margin measuring up to 10 mm. Salivary glands: The right submandibular gland is posteriorly displaced in the left submandibular gland is anteriorly displaced by bulky necrotic adenopathy. No primary disease suspected. The right submandibular gland may be invaded. Thyroid: Predominately calcified right-sided nodule. Subcentimeter left-sided nodule. Lymph nodes: New necrotic lymphadenopathy with right submandibular node measuring 4 cm and showing gross extracapsular tumor. Left cervical chain node measures 53 mm and also has extensive extracapsular tumor with IJ invasion causing tumor thrombus to the level of the subclavian confluence. This node also infiltrates the left supraglottic larynx pharynx (definitely into strap muscles and reaching the paraglottic fat) and sternocleidomastoid. Smaller necrotic right level 2 lymph node. Vascular: Left IJ invasion and tumor thrombus as above. Limited intracranial: Negative Visualized orbits: Not seen Mastoids and visualized paranasal sinuses: Clear Skeleton: Right submandibular adenopathy contacts the mandibular body, without invasion. No hematogenous osseous metastasis identified. Upper chest: Reported separately Other: Subcutaneous reticulation in the neck which could  be related to radiotherapy or venous obstruction. These results will be called to the ordering clinician or representative by the Radiologist Assistant, and communication documented in the PACS or zVision Dashboard. IMPRESSION: 1. New bulky bilateral necrotic lymphadenopathy. Left IJ chain node measures up to 53 mm and invades the IJ with tumor thrombus extending to the subclavian confluence. This node also invades the supraglottic larynx and left sternocleidomastoid. 2. Oral tongue primary with possible residual/recurrent tumor along the right margin. Electronically Signed   By: Monte Fantasia M.D.   On: 09/22/2016 14:35   Ct Chest W Contrast  Result Date: 09/22/2016 CLINICAL DATA:  Neck mass EXAM: CT CHEST WITH CONTRAST TECHNIQUE: Multidetector CT imaging of the chest was performed during intravenous contrast administration. CONTRAST:  43mL ISOVUE-300 IOPAMIDOL (ISOVUE-300) INJECTION 61% COMPARISON:  04/22/2016,  neck CT 09/22/2016 FINDINGS: Cardiovascular: Atherosclerotic vascular calcification of the aorta. No aneurysmal dilatation. Irregular mural thrombus in the descending thoracic aorta. Thrombus present within the left internal jugular vein to the subclavian confluence. Coronary artery calcification. Trace pericardial effusion. Mild cardiomegaly. Mediastinum/Nodes: Midline trachea. Multiple coarse calcifications in the right lobe of the thyroid with 17 mm peripherally calcified mass in the right lobe. Subcentimeter hypodense nodule left lobe of the thyroid. Ill-defined soft tissue density and stranding at the base of left neck and around the sternocleidomastoid muscle. Esophagus grossly unremarkable. No axillary adenopathy. No significant mediastinal or hilar adenopathy. Lungs/Pleura: Partially calcified 4 mm pulmonary nodule in the left upper lobe, suspect granuloma. No other nodules are visualized. No consolidation or pleural effusion. Upper Abdomen: No focal hepatic abnormalities in the imaged liver.  Calcified stones in the gallbladder. Splenic granuloma. 14 mm indeterminate mass in the left adrenal gland. Slightly narrowed appearance of the upper abdominal aorta with atherosclerosis. Musculoskeletal: Degenerative changes. No acute or suspicious bone lesions are visualized. IMPRESSION: 1. 4 mm left upper lobe partially calcified pulmonary nodule, suspect granuloma. No other pulmonary nodules are visualized. No significant mediastinal or hilar adenopathy. 2. Indeterminate 14 mm mass in the left adrenal gland. Further evaluation with adrenal CT or MRI may be obtained for further evaluation. 3. Thrombosis of the left internal jugular vein. Ill-defined soft tissue thickening and stranding anterior to the left jugular vein and around the inferior left sternocleidomastoid muscle. 4. Gallstones Electronically Signed   By: Donavan Foil M.D.   On: 09/22/2016 15:22   Nm Pet Image Initial (pi) Skull Base To Thigh  Addendum Date: 10/06/2016   ADDENDUM REPORT: 10/06/2016 14:08 ADDENDUM: The original report was by Dr. Van Clines. The following addendum is by Dr. Van Clines: I discussed these findings by telephone with Dr. Wilburn Cornelia at 1:50 p.m. on 10/06/2016. Electronically Signed   By: Van Clines M.D.   On: 10/06/2016 14:08   Result Date: 10/06/2016 CLINICAL DATA:  Initial treatment strategy for tongue cancer with lymph node metastatic disease. EXAM: NUCLEAR MEDICINE PET SKULL BASE TO THIGH TECHNIQUE: 5.4 mCi F-18 FDG was injected intravenously. Full-ring PET imaging was performed from the skull base to thigh after the radiotracer. CT data was obtained and used for attenuation correction and anatomic localization. FASTING BLOOD GLUCOSE:  Value: 165 mg/dl COMPARISON:  Multiple exams, including 09/22/2016 and 05/27/2011 CT scans FINDINGS: NECK A left cervical node spanning levels IIa, IIb, and III measures 6.8 cm in short axis and has a necrotic center with hypermetabolic rim, maximum SUV 17.1.  This distorts and displaces the left lateral limb of the hyoid bone. There appears to be some less certain nodularity/adenopathy along the upper margin of this lesion which extends from tangential to the mandible down to tangential to the clavicle. Just below the angle of the right mandible there is a level Ib lymph node measuring 4.2 cm in short axis with maximum SUV 19.1. The medial margin of this lesion abuts the right tongue base in the posteromedial margin abuts the right side of the hyoid bone. Aside from this mass along the right side of the tongue base, I do not see a separate hypermetabolic tongue mass. A right station IIa lymph node measuring 1.8 cm in diameter has a maximum SUV of 15.1. The combination of this bulky adenopathy significantly narrows the supraglottic trachea, causing a slit like appearance for example on image 28/4. 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 Low-density left internal  jugular vein is severely effaced by the massive left neck adenopathy and probably thrombosed as well. Today' s exam was not performed with IV contrast and accordingly venous assessment is problematic. CHEST No hypermetabolic mediastinal or hilar nodes. 4 mm left upper lobe nodule image 15/8, not currently hypermetabolic, no change from 16/03/9603. Coronary, aortic arch, and branch vessel atherosclerotic vascular disease. ABDOMEN/PELVIS No abnormal hypermetabolic activity within the liver, pancreas, adrenal glands, or spleen. No hypermetabolic lymph nodes in the abdomen or pelvis. Cholelithiasis. Hepatic morphology is somewhat lobular in raises the possibility of cirrhosis, correlate with clinical history. Trace perihepatic fluid along the right hepatic lobe for example image 102/4. No splenomegaly but there are splenic calcifications suggesting old granulomatous disease. Faint calcification along the slightly nodular left adrenal gland common no change from 5409, no hypermetabolic  activity, thought to likely be benign. Aortoiliac atherosclerotic vascular disease. Sigmoid colon diverticulosis without active diverticulitis. Pelvic floor laxity. Hypermetabolic activity along the perineum is most compatible with incontinence. Trace amount of free pelvic fluid. Appendix normal. SKELETON No focal hypermetabolic activity to suggest skeletal metastasis. Prominent sclerosis and lucency along the sacroiliac joints compatible with considerable chronic sacroiliitis, but without current hypermetabolic activity. IMPRESSION: 1. Bulky hypermetabolic bilateral neck adenopathy compatible with malignancy. These nodes are so large that they significantly narrow the supraglottic airway and could threaten the airway if further swelling or nodal enlargement occurs. I do not see a separate tongue mass (the original density along the left anterior tongue shown on 04/22/2016 is no longer seen) although the dominant right level Ib lymph node abuts the right tongue base. 2. 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 3. Hepatic morphology raises the possibility of cirrhosis, correlate with clinical history. Trace ascites. 4. 4 mm left upper lobe nodule is not changed from 09/22/2016, is not hypermetabolic but is below sensitive PET-CT size thresholds. 5. Other imaging findings of potential clinical significance: Coronary, aortic arch, and branch vessel atherosclerotic vascular disease. Aortoiliac atherosclerotic vascular disease. Cholelithiasis. Old granulomatous disease. Sigmoid diverticulosis. Pelvic floor laxity. Perineal activity compatible with urinary incontinence. Considerable chronic sacroiliitis. Radiology assistant personnel have been notified to put me in telephone contact with the referring physician or the referring physician's clinical representative in order to discuss  these findings. Once this communication is established I will issue an addendum to this report for documentation purposes. Electronically Signed: By: Van Clines M.D. On: 10/06/2016 13:34    ASSESSMENT:  Newly diagnosed squamous cell carcinoma of the Head & Neck, HPV N/A  PLAN:  Tongue cancer ([place]) The patient has complex past medical history including renal failure on hemodialysis (which has since resolved), severe substance abuse especially with alcoholism (has been abstinent) and background history of dementia. I have read a lot of the past medical records, in collaboration with history of from her daughter who appears to be the principal caregiver. In essence, this patient has rapidly progressive disease in the neck with impeding airway compromise without further treatment. Due to her significant comorbidities, I am not enthusiastic to prescribe aggressive chemotherapy The patient does not appear to fully grasps the gravity of potential complications that could arise from systemic chemotherapy With chemotherapy, it could potentially cause recurrence of renal failure, bone marrow suppression that could cause risk of infection, etc. If the patient is in agreement to accept risks of chemotherapy, I think the best course of action would be to give  her some palliative weekly chemotherapy with aggressive supportive care followed by radiation therapy down the road I reinforced the idea that her disease is unlikely to be curable and without further treatment soon, it would likely be metastatic to other organs very quickly Systemic chemotherapy should be able to offer some  palliation of symptoms with shrinkage of disease, reduce pain, and potentially can extend her life further, that to be balance with potential risk of complication and jeopardy against quality of life On one hand, she repeatedly stated that she wants to try treatment On the other hand, she also indicated that she does not want  "heroic efforts" if she develops complication related to chemotherapy, such as declining blood transfusion if chemotherapy caused bone marrow suppression and worsening anemia She repeatedly stated that she would surrender the decision to me and the radiation oncologist for the best course of action I also opened up the possibility of palliative care/hospice for symptom management only At the end of the day, after extensive consult of lasting more than an hour, we could not reach the conclusion with plan of care Her daughter will like to discuss in private with the patient further will hopefully get back to me before the end of the week so that we can plan the next step  Neuropathy due to medical condition [facility]) The patient has chronic peripheral neuropathy likely secondary to diabetes and alcohol induced neuropathy This appeared to be stable I felt that she would still be able to get cisplatin, carboplatin and paclitaxel if needed with dose adjustment  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 After extensive review of prior evaluation by neurologist from [place], I felt that the patient indeed may have age related dementia but appears to be coping well with aggressive social support This would not prohibit chemotherapy treatment for her but extreme caution needs to be in place and she would likely need aggressive supportive care with weekly medical oncology visit to make sure she does not develop severe complication related to side effects of treatment  Goals of care, counseling/discussion The patient is aware she has incurable disease and treatment is strictly palliative. We discussed importance of Advanced Directives and Living will. Her daughter appears to be the healthcare medical power of attorney We discussed potential referral for home base palliative care  if the patient wants symptom management only. We have very extensive discussion today regarding goals of care, prognosis and potential outcome with or without chemotherapy    Cancer associated pain The patient may benefit from more aggressive prescription narcotic for cancer associated pain. 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 She is given Tylenol during the discussion due to pain  Tobacco abuse I spent some time counseling the patient the importance of tobacco cessation. We discussed common tobacco cessation strategies She is interested to quit     All questions were answered. The patient knows to call the clinic with any problems, questions or concerns. I spent 70 minutes counseling the patient face to face. The total time spent in the appointment was 95 minutes and more than 50% was on counseling.     Heath Lark, MD 10/08/16 8:13 AM

## 2016-10-08 NOTE — Assessment & Plan Note (Signed)
The patient was in a dementia care facility up until recently and has moved back to home with regular caregivers helping her out at home. After extensive review of prior evaluation by neurologist from [place], I felt that the patient indeed may have age related dementia but appears to be coping well with aggressive social support This would not prohibit chemotherapy treatment for her but extreme caution needs to be in place and she would likely need aggressive supportive care with weekly medical oncology visit to make sure she does not develop severe complication related to side effects of treatment

## 2016-10-08 NOTE — Assessment & Plan Note (Signed)
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

## 2016-10-08 NOTE — Assessment & Plan Note (Signed)
The patient has complex past medical history including renal failure on hemodialysis (which has since resolved), severe substance abuse especially with alcoholism (has been abstinent) and background history of dementia. I have read a lot of the past medical records, in collaboration with history of from her daughter who appears to be the principal caregiver. In essence, this patient has rapidly progressive disease in the neck with impeding airway compromise without further treatment. Due to her significant comorbidities, I am not enthusiastic to prescribe aggressive chemotherapy The patient does not appear to fully grasps the gravity of potential complications that could arise from systemic chemotherapy With chemotherapy, it could potentially cause recurrence of renal failure, bone marrow suppression that could cause risk of infection, etc. If the patient is in agreement to accept risks of chemotherapy, I think the best course of action would be to give her some palliative weekly chemotherapy with aggressive supportive care followed by radiation therapy down the road I reinforced the idea that her disease is unlikely to be curable and without further treatment soon, it would likely be metastatic to other organs very quickly Systemic chemotherapy should be able to offer some  palliation of symptoms with shrinkage of disease, reduce pain, and potentially can extend her life further, that to be balance with potential risk of complication and jeopardy against quality of life On one hand, she repeatedly stated that she wants to try treatment On the other hand, she also indicated that she does not want "heroic efforts" if she develops complication related to chemotherapy, such as declining blood transfusion if chemotherapy caused bone marrow suppression and worsening anemia She repeatedly stated that she would surrender the decision to me and the radiation oncologist for the best course of action I also opened  up the possibility of palliative care/hospice for symptom management only At the end of the day, after extensive consult of lasting more than an hour, we could not reach the conclusion with plan of care Her daughter will like to discuss in private with the patient further will hopefully get back to me before the end of the week so that we can plan the next step

## 2016-10-08 NOTE — Assessment & Plan Note (Signed)
The patient is aware she has incurable disease and treatment is strictly palliative. We discussed importance of Advanced Directives and Living will. Her daughter appears to be the healthcare medical power of attorney We discussed potential referral for home base palliative care if the patient wants symptom management only. We have very extensive discussion today regarding goals of care, prognosis and potential outcome with or without chemotherapy

## 2016-10-08 NOTE — Assessment & Plan Note (Signed)
The patient may benefit from more aggressive prescription narcotic for cancer associated pain. 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 She is given Tylenol during the discussion due to pain

## 2016-10-09 ENCOUNTER — Telehealth: Payer: Self-pay | Admitting: *Deleted
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
## 2016-10-09 NOTE — Progress Notes (Signed)
Oncology Nurse Navigator Documentation  Met with Ms. Mcweeney and her daughter during initial consult with Dr. Alvy Bimler.   Reinforced Dr. Calton Dach explanation of chemotherapy risk/benefit, recommendation of induction chemo prior to RT. Showed them the location of Dr. Ritta Slot office and [facility] Radiology as reference for future appts, including arrival procedure for these appts.   They verbalized understanding of information provided. I encouraged them to call with questions/concerns, they verbalized understanding.  [name], RN, BSN, [place] Neck Oncology Nurse [place] at [place] [phone]

## 2016-10-09 NOTE — Progress Notes (Signed)
Oncology Nurse Navigator Documentation  Met with Ms. Megan Salon during initial consult with Dr. Isidore Moos.  She was accompanied by her daughter Seth Bake.  1. Further introduced myself as her/their Navigator, explained my role as a member of the Care Team.   2. Provided New Patient Information packet, discussed contents:  Contact information for physician(s), myself, other members of the Care Team.  Advance Directive information ([place] blue pamphlet with LCSW contact info)  Fall Prevention Patient [place] sheet  [facility] map with highlight of [place] 3. Provided introductory explanation of radiation treatment including SIM planning and purpose of Aquaplast head and shoulder mask, showed them example.   Provided information/discussed opportunities for smoking cessation support:    QuitSmart class/individual counseling at [facility] information They verbalized understanding of information provided.   I accompanied them to consult appt with Dr. Alvy Bimler.  [name], RN, BSN, [place] Neck Oncology Nurse [place] at [place] [phone]

## 2016-10-12 ENCOUNTER — Other Ambulatory Visit: Payer: Self-pay | Admitting: *Deleted

## 2016-10-12 ENCOUNTER — Telehealth: Payer: Self-pay | Admitting: *Deleted

## 2016-10-12 ENCOUNTER — Ambulatory Visit (HOSPITAL_COMMUNITY): Payer: Medicare Other

## 2016-10-12 ENCOUNTER — Other Ambulatory Visit: Payer: Self-pay | Admitting: Radiation Oncology

## 2016-10-12 DIAGNOSIS — C023 Malignant neoplasm of anterior two-thirds of tongue, part unspecified: Secondary | ICD-10-CM

## 2016-10-12 DIAGNOSIS — C029 Malignant neoplasm of tongue, unspecified: Secondary | ICD-10-CM

## 2016-10-12 DIAGNOSIS — Z1329 Encounter for screening for other suspected endocrine disorder: Secondary | ICD-10-CM

## 2016-10-12 NOTE — Telephone Encounter (Signed)
Oncology Nurse Navigator Documentation  Received VM from Ms. Sow's daughter, Seth Bake, indicating her mother and she have decided to move forward with RT.  Note routed to Drs. Herbie Drape and [place].  [name], RN, BSN, [place] Neck Oncology Nurse [place] at [place] [phone]

## 2016-10-12 NOTE — Telephone Encounter (Signed)
I will keep her on my list for now with plan to see her back after RT is completed

## 2016-10-12 NOTE — Telephone Encounter (Signed)
Oncology Nurse Navigator Documentation  LVM for patient's dtr, indicating Radiation Planning is scheduled for this Wednesday starting with 1330 lab, [place] IV Start, 1500 CT SIM. I encouraged her to call me with questions.  [name], RN, BSN, [place] Neck Oncology Nurse [place] at [place] [phone]

## 2016-10-13 ENCOUNTER — Encounter (HOSPITAL_COMMUNITY): Payer: Medicare Other

## 2016-10-13 NOTE — Progress Notes (Signed)
error 

## 2016-10-14 ENCOUNTER — Ambulatory Visit: Payer: Medicare Other

## 2016-10-14 ENCOUNTER — Ambulatory Visit
Admission: RE | Admit: 2016-10-14 | Discharge: 2016-10-14 | Disposition: A | Discharge status: Home / Self Care | Payer: Medicare Other | Source: Ambulatory Visit | Attending: Radiation Oncology | Admitting: Radiation Oncology

## 2016-10-14 ENCOUNTER — Ambulatory Visit: Payer: Medicare Other | Admitting: Radiation Oncology

## 2016-10-14 DIAGNOSIS — C023 Malignant neoplasm of anterior two-thirds of tongue, part unspecified: Secondary | ICD-10-CM

## 2016-10-14 NOTE — Progress Notes (Signed)
Head and Neck Cancer Simulation note     Diagnosis:    ICD-9-CM ICD-10-CM   1. Cancer of anterior two-thirds of tongue (HCC) 141.4 C02.3     No-Show.   Eppie Gibson, MD

## 2016-10-15 ENCOUNTER — Telehealth: Payer: Self-pay | Admitting: *Deleted

## 2016-10-15 NOTE — Telephone Encounter (Signed)
Oncology Nurse Navigator Documentation  Spoke with Ms. Bohl's daughter, Seth Bake, informed her yesterday's appts rescheduled for Monday, 4/30:  12:30 Lab, 1:15 IV Start, 2:00 CT SIM.  She voiced understanding.  [name], RN, BSN, [place] Neck Oncology Nurse [place] at [place] [phone]

## 2016-10-19 ENCOUNTER — Ambulatory Visit
Admission: RE | Admit: 2016-10-19 | Discharge: 2016-10-19 | Disposition: A | Discharge status: Home / Self Care | Payer: Medicare Other | Source: Ambulatory Visit | Attending: Radiation Oncology | Admitting: Radiation Oncology

## 2016-10-19 ENCOUNTER — Emergency Department (HOSPITAL_COMMUNITY): Payer: Medicare Other

## 2016-10-19 ENCOUNTER — Encounter (HOSPITAL_COMMUNITY): Payer: Self-pay | Admitting: Emergency Medicine

## 2016-10-19 ENCOUNTER — Emergency Department (HOSPITAL_COMMUNITY)
Admission: EM | Admit: 2016-10-19 | Discharge: 2016-10-19 | Disposition: A | Discharge status: Home / Self Care | Payer: Medicare Other | Source: Home / Self Care | Attending: Emergency Medicine | Admitting: Emergency Medicine

## 2016-10-19 DIAGNOSIS — E876 Hypokalemia: Secondary | ICD-10-CM

## 2016-10-19 DIAGNOSIS — C023 Malignant neoplasm of anterior two-thirds of tongue, part unspecified: Secondary | ICD-10-CM

## 2016-10-19 DIAGNOSIS — E1140 Type 2 diabetes mellitus with diabetic neuropathy, unspecified: Secondary | ICD-10-CM

## 2016-10-19 DIAGNOSIS — E039 Hypothyroidism, unspecified: Secondary | ICD-10-CM

## 2016-10-19 DIAGNOSIS — I10 Essential (primary) hypertension: Secondary | ICD-10-CM

## 2016-10-19 DIAGNOSIS — Z85810 Personal history of malignant neoplasm of tongue: Secondary | ICD-10-CM | POA: Insufficient documentation

## 2016-10-19 DIAGNOSIS — Z1329 Encounter for screening for other suspected endocrine disorder: Secondary | ICD-10-CM

## 2016-10-19 DIAGNOSIS — J449 Chronic obstructive pulmonary disease, unspecified: Secondary | ICD-10-CM | POA: Insufficient documentation

## 2016-10-19 DIAGNOSIS — F17210 Nicotine dependence, cigarettes, uncomplicated: Secondary | ICD-10-CM | POA: Insufficient documentation

## 2016-10-19 DIAGNOSIS — C029 Malignant neoplasm of tongue, unspecified: Secondary | ICD-10-CM

## 2016-10-19 DIAGNOSIS — R221 Localized swelling, mass and lump, neck: Secondary | ICD-10-CM

## 2016-10-19 LAB — URINALYSIS, ROUTINE W REFLEX MICROSCOPIC
BILIRUBIN URINE: NEGATIVE
Glucose, UA: NEGATIVE mg/dL
KETONES UR: NEGATIVE mg/dL
Leukocytes, UA: NEGATIVE
Nitrite: POSITIVE — AB
PH: 5 (ref 5.0–8.0)
Protein, ur: 30 mg/dL — AB
Specific Gravity, Urine: 1.029 (ref 1.005–1.030)

## 2016-10-19 LAB — CBC WITH DIFFERENTIAL/PLATELET
BASO%: 0.2 % (ref 0.0–2.0)
Basophils Absolute: 0.1 10*3/uL (ref 0.0–0.1)
EOS%: 1.6 % (ref 0.0–7.0)
Eosinophils Absolute: 0.5 10*3/uL (ref 0.0–0.5)
HEMATOCRIT: 35.4 % (ref 34.8–46.6)
HEMOGLOBIN: 11.6 g/dL (ref 11.6–15.9)
LYMPH#: 2.5 10*3/uL (ref 0.9–3.3)
LYMPH%: 7.5 % — ABNORMAL LOW (ref 14.0–49.7)
MCH: 27 pg (ref 25.1–34.0)
MCHC: 32.8 g/dL (ref 31.5–36.0)
MCV: 82.3 fL (ref 79.5–101.0)
MONO#: 1.4 10*3/uL — AB (ref 0.1–0.9)
MONO%: 4.3 % (ref 0.0–14.0)
NEUT%: 86.4 % — AB (ref 38.4–76.8)
NEUTROS ABS: 28.7 10*3/uL — AB (ref 1.5–6.5)
PLATELETS: 297 10*3/uL (ref 145–400)
RBC: 4.3 10*6/uL (ref 3.70–5.45)
RDW: 16.2 % — ABNORMAL HIGH (ref 11.2–14.5)
WBC: 33.2 10*3/uL — ABNORMAL HIGH (ref 3.9–10.3)

## 2016-10-19 LAB — BASIC METABOLIC PANEL
Anion Gap: 12 mEq/L — ABNORMAL HIGH (ref 3–11)
BUN: 11.3 mg/dL (ref 7.0–26.0)
CHLORIDE: 103 meq/L (ref 98–109)
CO2: 30 meq/L — AB (ref 22–29)
Calcium: 12.6 mg/dL — ABNORMAL HIGH (ref 8.4–10.4)
Creatinine: 0.8 mg/dL (ref 0.6–1.1)
EGFR: 83 mL/min/{1.73_m2} — ABNORMAL LOW (ref >90)
GLUCOSE: 153 mg/dL — AB (ref 70–140)
Sodium: 145 mEq/L (ref 136–145)

## 2016-10-19 LAB — TSH: TSH: 0.638 m[IU]/L (ref 0.308–3.960)

## 2016-10-19 MED ORDER — IOPAMIDOL (ISOVUE-300) INJECTION 61%
INTRAVENOUS | Status: AC
  Administered 2016-10-19: 75 mL via INTRAVENOUS
  Filled 2016-10-19: qty 75

## 2016-10-19 MED ORDER — ACETAMINOPHEN 500 MG PO TABS
1000.0000 mg | ORAL_TABLET | Freq: Once | ORAL | Status: AC
  Administered 2016-10-19: 1000 mg via ORAL
  Filled 2016-10-19: qty 2

## 2016-10-19 MED ORDER — SODIUM CHLORIDE 0.9 % IV BOLUS (SEPSIS)
1000.0000 mL | Freq: Once | INTRAVENOUS | Status: AC
  Administered 2016-10-19: 1000 mL via INTRAVENOUS

## 2016-10-19 MED ORDER — SODIUM CHLORIDE 0.9 % IV SOLN
30.0000 meq | Freq: Once | INTRAVENOUS | Status: DC
Start: 2016-10-19 — End: 2016-10-19

## 2016-10-19 MED ORDER — POTASSIUM CHLORIDE 10 MEQ/100ML IV SOLN
10.0000 meq | INTRAVENOUS | Status: AC
  Administered 2016-10-19 (×3): 10 meq via INTRAVENOUS
  Filled 2016-10-19 (×3): qty 100

## 2016-10-19 NOTE — Progress Notes (Signed)
error 

## 2016-10-19 NOTE — Progress Notes (Signed)
Has armband been applied?    Does patient have an allergy to IV contrast dye?: No   Has patient ever received premedication for IV contrast dye?:  N/A  Does patient take metformin?: Yes. She was called on 10/13/16 at 12:00 pm and reminded not to take her Metformin on 10/14/16. A voice mail was left with a request for a return call.   If patient does take metformin when was the last dose:   Date of lab work: 10/14/16 BUN:  CR:   IV site:   Has IV site been added to flowsheet?

## 2016-10-19 NOTE — Discharge Instructions (Signed)
You were seen in the ED today with abnormal lab work and worsening neck mass. The mass is bigger but no other acute findings. You should discuss with your oncologist tomorrow regarding the plan and see the radiation oncology team tomorrow.   Return to the ED immediately with any difficulty breathing, fever, chills, or difficulty swallowing.

## 2016-10-19 NOTE — ED Provider Notes (Signed)
Blood pressure 106/87, pulse 83, temperature 98.4 F (36.9 C), resp. rate 17, last menstrual period 05/17/1983, SpO2 98 %.  Assuming care from Dr. Venora Maples.  In short, [name] is a 83 y.o. female with a chief complaint of Abnormal Labs .  Refer to the original H&P for additional details.  The current plan of care is to f/u CT and reassess.  07:11 PM Spoke with oncology regarding the patient's CT scan. Suspect that the white count is secondary to increased necrotic lymphadenopathy. No other objective findings to suggest systemic infection. Patient's electrolytes have been repleted. Patient has been eating and drinking here in the emergency department. She is scheduled to see radiation oncology tomorrow. She is in no respiratory distress. She is lying flat without difficulty. The potential for airway compromise has been discussed with the patient in the past after review of prior notes. She is insistent that she return home and not be admitted which seems reasonable given her diagnosis and follow up plan already in place.   Tammy Quinton, MD    Tammy Fast, MD 10/20/16 [phone]

## 2016-10-19 NOTE — ED Triage Notes (Addendum)
Pt comes from cancer center after supposed to be getting simulated for radiation treatment for head and neck cancer. Pt has obvious masses noted to both sides of neck.  RN stated potassium was low around 2.9 and calcium was high around 12.6.  IV in place on arrival.  Pt A&O x4.  Ambulatory.

## 2016-10-19 NOTE — ED Notes (Signed)
Bed: RESB Expected date:  Expected time:  Means of arrival:  Comments: ONCOLOGY RADIOLOGY abnormal labs

## 2016-10-19 NOTE — ED Notes (Signed)
Third round of Potassium IV finishing at this time. Pt friend called and HIPPA appropriate voice mail left for Philippa Chester to call back.

## 2016-10-19 NOTE — ED Provider Notes (Signed)
[name] DEPT Provider Note   CSN: [id number] Arrival date & time: 10/19/16  1441     History   Chief Complaint Chief Complaint  Patient presents with  . Abnormal Labs    HPI [name] is a 83 y.o. female.  HPI Patient presents to the emergency department from the cancer center after she was found to have a potassium of 2.9 and calcium of 12.6.  She isn't to the ER for further evaluation.  She currently is being evaluated for rapidly progressing tumor in her left neck and jaw region for which she is scheduled to have radiation.  She states she otherwise feels fine.  She's been eating and drinking normally.  She's having no difficulty breathing.  She denies chest pain shortness breath.  She denies urinary symptoms.  No abdominal pain.  She is requesting something to eat and states she feels fine.   Past Medical History:  Diagnosis Date  . Abdominal pain   . Blood dyscrasia    HYPOKALEMIA  . Blood transfusion   . COPD (chronic obstructive pulmonary disease) ([place])   . Dementia    pathologic aging and dementia per daughter  . Diabetes mellitus    Type II  . Heart murmur   . History of blood transfusion   . Hypertension   . Hypothyroid   . Neuropathy   . Substance abuse    ETOH with dementia and prior encephalopathy and withdrawal  . Tongue cancer ([place]) 05/13/2016  . Tricuspid regurgitation 2012   mild  . Urinary tract infection     Patient Active Problem List   Diagnosis Date Noted  . 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 Neuropathy due to medical condition ([place]) 10/08/2016  . Cancer associated pain 10/08/2016  . Tobacco abuse 10/08/2016  . Cancer of anterior two-thirds of tongue ([place]) 05/13/2016  . Fluid retention 03/01/2013  . Aortic heart murmur on examination 03/01/2013  . Severe malnutrition ([place]) 02/08/2013  . Leukocytosis, unspecified 02/01/2013  . Hypomagnesemia 07/19/2012  . Hypocalcemia 07/18/2012  . Weakness 01/04/2012   . UTI (lower urinary tract infection) 01/04/2012  . Thrombocytopenia ([place]) 05/26/2011  . DM (diabetes mellitus) ([place]) 05/26/2011  . Hypertriglyceridemia 05/26/2011  . Upper GI bleed 05/26/2011  . Gastritis 05/25/2011  . Anemia 05/25/2011  . Alcohol induced fatty liver 05/25/2011  . Hypothyroid 05/25/2011  . Hypokalemia 05/25/2011    Past Surgical History:  Procedure Laterality Date  . ABDOMINAL HYSTERECTOMY     1970's  . BREAST SURGERY Left    Lumpectomy  . COLONOSCOPY    . ESOPHAGOGASTRODUODENOSCOPY  05/17/2011   Procedure: ESOPHAGOGASTRODUODENOSCOPY (EGD);  Surgeon: Lafayette Dragon, MD;  Location[facility] ENDOSCOPY;  Service: Endoscopy;  Laterality: N/A;  . EXCISION OF TONGUE LESION Left 05/13/2016   Procedure: WIDE LOCAL EXCISION OF LEFT LATERAL TONGUE CANCER;  Surgeon: Jerrell Belfast, MD;  Location: [facility];  Service: ENT;  Laterality: Left;  . EXCISION OF TONGUE LESION Left 06/12/2016   Procedure: RE-EXCISION OF LEFT  TONGUE CANCER;  Surgeon: Jerrell Belfast, MD;  Location: [facility];  Service: ENT;  Laterality: Left;  . EYE SURGERY Bilateral    Cataract  . TONGUE SURGERY  05/13/2016   WIDE LOCAL EXCISION OF LEFT LATERAL TONGUE CANCER (Left) with Local Soft Tissue Reconstruction   . TONSILLECTOMY AND ADENOIDECTOMY      OB History    No data available       Home Medications    Prior to Admission medications  Medication Sig Start Date End Date Taking? Authorizing Provider  folic acid (FOLVITE) 1 MG tablet Take 1 mg by mouth daily.   Yes Historical Provider, MD  glimepiride (AMARYL) 1 MG tablet Take 1 mg by mouth daily with breakfast.   Yes Historical Provider, MD  ibuprofen (ADVIL,MOTRIN) 200 MG tablet Take 200 mg by mouth every 6 (six) hours as needed for moderate pain.   Yes Historical Provider, MD  latanoprost (XALATAN) 0.005 % ophthalmic solution Place 1 drop into both eyes at bedtime.   Yes Historical Provider, MD  levothyroxine (SYNTHROID, LEVOTHROID) 88 MCG tablet Take  88 mcg by mouth daily before breakfast.   Yes Historical Provider, MD  lisinopril (PRINIVIL,ZESTRIL) 10 MG tablet Take 10 mg by mouth daily.   Yes Historical Provider, MD  metFORMIN (GLUCOPHAGE-XR) 500 MG 24 hr tablet Take 500 mg by mouth daily after supper.    Yes Historical Provider, MD  potassium chloride (K-DUR,KLOR-CON) 10 MEQ tablet Take 10 mEq by mouth 2 (two) times daily.   Yes Historical Provider, MD  HYDROcodone-acetaminophen (HYCET) 7.5-325 mg/15 ml solution Take 10-15 mLs by mouth every 4 (four) hours as needed for moderate pain. Patient not taking: Reported on 10/07/2016 06/12/16   Jerrell Belfast, MD    Family History Family History  Problem Relation Age of Onset  . Stroke Mother     Social History Social History  Substance Use Topics  . Smoking status: Current Every Day Smoker    Packs/day: 0.50    Years: 30.00    Types: Cigarettes  . Smokeless tobacco: Never Used  . Alcohol use Yes     Comment: social     Allergies   Tuberculin tests   Review of Systems Review of Systems  All other systems reviewed and are negative.    Physical Exam Updated Vital Signs BP 106/87 (BP Location: Left Arm)   Pulse 83   Temp 98.4 F (36.9 C)   Resp 17   LMP 05/17/1983   SpO2 98%   Physical Exam  Constitutional: She is oriented to person, place, and time. She appears well-developed and well-nourished. No distress.  HENT:  Displacement of her tongue towards the right.  Tolerating secretions.  Eyes: EOM are normal.  Neck: Normal range of motion.  Large non-mobile mass of the left lateral neck without significant warmth or tenderness.  Cardiovascular: Normal rate, regular rhythm and normal heart sounds.   Pulmonary/Chest: Effort normal and breath sounds normal.  Abdominal: Soft. She exhibits no distension. There is no tenderness.  Musculoskeletal: Normal range of motion.  Neurological: She is alert and oriented to person, place, and time.  Skin: Skin is warm and dry.    Psychiatric: She has a normal mood and affect. Judgment normal.  Nursing note and vitals reviewed.    ED Treatments / Results  Labs (all labs ordered are listed, but only abnormal results are displayed) Labs Reviewed  URINALYSIS, ROUTINE W REFLEX MICROSCOPIC    EKG  EKG Interpretation None       Radiology No results found.  Procedures Procedures (including critical care time)  Medications Ordered in ED Medications  potassium chloride 10 mEq in 100 mL IVPB (10 mEq Intravenous New Bag/Given 10/19/16 1658)  sodium chloride 0.9 % bolus 1,000 mL (1,000 mLs Intravenous New Bag/Given 10/19/16 1658)  iopamidol (ISOVUE-300) 61 % injection (75 mLs Intravenous Contrast Given 10/19/16 1714)     Initial Impression / Assessment and Plan / ED Course  I have reviewed the triage vital  signs and the nursing notes.  Pertinent labs & imaging results that were available during my care of the patient were reviewed by me and considered in my medical decision making (see chart for details).     Patient is overall well-appearing.  The patient was scheduled to have CT imaging of her neck with contrast per the patient's family today therefore this will be obtained at this time.  Replacement of the IV potassium at this time.  Hypercalcemia likely secondary to her cancer but I do not think this needs acute hospitalization.  Plan will be for IV fluids here in emergency department CT imaging of her neck.  I suspect she can safely be discharged from the emergency department tonight.  5:30 PM Care transferred to Dr. Gara Kroner to follow-up on CT imaging and disposition  Final Clinical Impressions(s) / ED Diagnoses   Final diagnoses:  None    New Prescriptions New Prescriptions   No medications on file     Jola Schmidt, MD 10/19/16 1730

## 2016-10-20 ENCOUNTER — Encounter: Payer: Medicare Other | Admitting: Hematology and Oncology

## 2016-10-20 ENCOUNTER — Inpatient Hospital Stay (HOSPITAL_COMMUNITY): Payer: Medicare Other

## 2016-10-20 ENCOUNTER — Inpatient Hospital Stay (HOSPITAL_COMMUNITY)
Admission: AD | Admit: 2016-10-20 | Discharge: 2016-10-31 | DRG: 146 | Disposition: A | Discharge status: Skilled Nursing Facility | Payer: Medicare Other | Source: Ambulatory Visit | Attending: Hematology and Oncology | Admitting: Hematology and Oncology

## 2016-10-20 ENCOUNTER — Telehealth: Payer: Self-pay | Admitting: *Deleted

## 2016-10-20 ENCOUNTER — Encounter (HOSPITAL_COMMUNITY): Payer: Self-pay

## 2016-10-20 ENCOUNTER — Ambulatory Visit
Admission: RE | Admit: 2016-10-20 | Discharge: 2016-10-20 | Disposition: A | Discharge status: Home / Self Care | Payer: Medicare Other | Source: Ambulatory Visit | Attending: Radiation Oncology | Admitting: Radiation Oncology

## 2016-10-20 DIAGNOSIS — R061 Stridor: Secondary | ICD-10-CM | POA: Diagnosis not present

## 2016-10-20 DIAGNOSIS — L03221 Cellulitis of neck: Secondary | ICD-10-CM

## 2016-10-20 DIAGNOSIS — G629 Polyneuropathy, unspecified: Secondary | ICD-10-CM | POA: Diagnosis present

## 2016-10-20 DIAGNOSIS — K5909 Other constipation: Secondary | ICD-10-CM

## 2016-10-20 DIAGNOSIS — C029 Malignant neoplasm of tongue, unspecified: Secondary | ICD-10-CM | POA: Diagnosis present

## 2016-10-20 DIAGNOSIS — R739 Hyperglycemia, unspecified: Secondary | ICD-10-CM | POA: Diagnosis not present

## 2016-10-20 DIAGNOSIS — E8770 Fluid overload, unspecified: Secondary | ICD-10-CM | POA: Diagnosis not present

## 2016-10-20 DIAGNOSIS — R011 Cardiac murmur, unspecified: Secondary | ICD-10-CM | POA: Diagnosis present

## 2016-10-20 DIAGNOSIS — E1165 Type 2 diabetes mellitus with hyperglycemia: Secondary | ICD-10-CM | POA: Diagnosis present

## 2016-10-20 DIAGNOSIS — Z90710 Acquired absence of both cervix and uterus: Secondary | ICD-10-CM

## 2016-10-20 DIAGNOSIS — C770 Secondary and unspecified malignant neoplasm of lymph nodes of head, face and neck: Secondary | ICD-10-CM

## 2016-10-20 DIAGNOSIS — J690 Pneumonitis due to inhalation of food and vomit: Secondary | ICD-10-CM | POA: Diagnosis not present

## 2016-10-20 DIAGNOSIS — D72829 Elevated white blood cell count, unspecified: Secondary | ICD-10-CM

## 2016-10-20 DIAGNOSIS — D72828 Other elevated white blood cell count: Secondary | ICD-10-CM | POA: Diagnosis not present

## 2016-10-20 DIAGNOSIS — R001 Bradycardia, unspecified: Secondary | ICD-10-CM | POA: Diagnosis present

## 2016-10-20 DIAGNOSIS — R221 Localized swelling, mass and lump, neck: Secondary | ICD-10-CM | POA: Diagnosis present

## 2016-10-20 DIAGNOSIS — J449 Chronic obstructive pulmonary disease, unspecified: Secondary | ICD-10-CM | POA: Diagnosis present

## 2016-10-20 DIAGNOSIS — G893 Neoplasm related pain (acute) (chronic): Secondary | ICD-10-CM

## 2016-10-20 DIAGNOSIS — J189 Pneumonia, unspecified organism: Secondary | ICD-10-CM | POA: Diagnosis not present

## 2016-10-20 DIAGNOSIS — K5900 Constipation, unspecified: Secondary | ICD-10-CM | POA: Diagnosis present

## 2016-10-20 DIAGNOSIS — F1020 Alcohol dependence, uncomplicated: Secondary | ICD-10-CM | POA: Diagnosis present

## 2016-10-20 DIAGNOSIS — K117 Disturbances of salivary secretion: Secondary | ICD-10-CM

## 2016-10-20 DIAGNOSIS — R059 Cough, unspecified: Secondary | ICD-10-CM

## 2016-10-20 DIAGNOSIS — E876 Hypokalemia: Secondary | ICD-10-CM | POA: Diagnosis present

## 2016-10-20 DIAGNOSIS — Z66 Do not resuscitate: Secondary | ICD-10-CM | POA: Diagnosis present

## 2016-10-20 DIAGNOSIS — F17210 Nicotine dependence, cigarettes, uncomplicated: Secondary | ICD-10-CM | POA: Diagnosis present

## 2016-10-20 DIAGNOSIS — C779 Secondary and unspecified malignant neoplasm of lymph node, unspecified: Secondary | ICD-10-CM

## 2016-10-20 DIAGNOSIS — L0211 Cutaneous abscess of neck: Secondary | ICD-10-CM

## 2016-10-20 DIAGNOSIS — F0280 Dementia in other diseases classified elsewhere without behavioral disturbance: Secondary | ICD-10-CM | POA: Diagnosis present

## 2016-10-20 DIAGNOSIS — Z6821 Body mass index (BMI) 21.0-21.9, adult: Secondary | ICD-10-CM

## 2016-10-20 DIAGNOSIS — E042 Nontoxic multinodular goiter: Secondary | ICD-10-CM | POA: Diagnosis present

## 2016-10-20 DIAGNOSIS — E119 Type 2 diabetes mellitus without complications: Secondary | ICD-10-CM

## 2016-10-20 DIAGNOSIS — E039 Hypothyroidism, unspecified: Secondary | ICD-10-CM | POA: Diagnosis present

## 2016-10-20 DIAGNOSIS — C023 Malignant neoplasm of anterior two-thirds of tongue, part unspecified: Secondary | ICD-10-CM | POA: Diagnosis not present

## 2016-10-20 DIAGNOSIS — R0603 Acute respiratory distress: Secondary | ICD-10-CM | POA: Diagnosis present

## 2016-10-20 DIAGNOSIS — R05 Cough: Secondary | ICD-10-CM

## 2016-10-20 DIAGNOSIS — R839 Unspecified abnormal finding in cerebrospinal fluid: Secondary | ICD-10-CM | POA: Diagnosis not present

## 2016-10-20 DIAGNOSIS — I959 Hypotension, unspecified: Secondary | ICD-10-CM | POA: Diagnosis not present

## 2016-10-20 DIAGNOSIS — I10 Essential (primary) hypertension: Secondary | ICD-10-CM | POA: Diagnosis present

## 2016-10-20 DIAGNOSIS — E440 Moderate protein-calorie malnutrition: Secondary | ICD-10-CM | POA: Diagnosis present

## 2016-10-20 DIAGNOSIS — F0390 Unspecified dementia without behavioral disturbance: Secondary | ICD-10-CM | POA: Diagnosis not present

## 2016-10-20 DIAGNOSIS — E1140 Type 2 diabetes mellitus with diabetic neuropathy, unspecified: Secondary | ICD-10-CM

## 2016-10-20 DIAGNOSIS — T380X5A Adverse effect of glucocorticoids and synthetic analogues, initial encounter: Secondary | ICD-10-CM | POA: Diagnosis not present

## 2016-10-20 LAB — COMPREHENSIVE METABOLIC PANEL
ALBUMIN: 3.5 g/dL (ref 3.5–5.0)
ALT: 12 U/L — ABNORMAL LOW (ref 14–54)
ANION GAP: 10 (ref 5–15)
AST: 16 U/L (ref 15–41)
Alkaline Phosphatase: 114 U/L (ref 38–126)
BILIRUBIN TOTAL: 0.4 mg/dL (ref 0.3–1.2)
BUN: 7 mg/dL (ref 6–20)
CHLORIDE: 95 mmol/L — AB (ref 101–111)
CO2: 30 mmol/L (ref 22–32)
Calcium: 11 mg/dL — ABNORMAL HIGH (ref 8.9–10.3)
Creatinine, Ser: 0.52 mg/dL (ref 0.44–1.00)
GFR calc Af Amer: >60 mL/min (ref >60)
GFR calc non Af Amer: >60 mL/min (ref >60)
GLUCOSE: 142 mg/dL — AB (ref 65–99)
POTASSIUM: 2.4 mmol/L — AB (ref 3.5–5.1)
SODIUM: 135 mmol/L (ref 135–145)
TOTAL PROTEIN: 8 g/dL (ref 6.5–8.1)

## 2016-10-20 LAB — CBC WITH DIFFERENTIAL/PLATELET
BASOS ABS: 0 10*3/uL (ref 0.0–0.1)
Basophils Relative: 0 %
EOS PCT: 1 %
Eosinophils Absolute: 0.4 10*3/uL (ref 0.0–0.7)
HEMATOCRIT: 34.9 % — AB (ref 36.0–46.0)
HEMOGLOBIN: 11.8 g/dL — AB (ref 12.0–15.0)
LYMPHS ABS: 2.2 10*3/uL (ref 0.7–4.0)
LYMPHS PCT: 6 %
MCH: 27.5 pg (ref 26.0–34.0)
MCHC: 33.8 g/dL (ref 30.0–36.0)
MCV: 81.4 fL (ref 78.0–100.0)
MONOS PCT: 5 %
Monocytes Absolute: 1.8 10*3/uL — ABNORMAL HIGH (ref 0.1–1.0)
NEUTROS ABS: 31.7 10*3/uL — AB (ref 1.7–7.7)
Neutrophils Relative %: 88 %
Platelets: 290 10*3/uL (ref 150–400)
RBC: 4.29 MIL/uL (ref 3.87–5.11)
RDW: 15.5 % (ref 11.5–15.5)
WBC: 36.1 10*3/uL — ABNORMAL HIGH (ref 4.0–10.5)

## 2016-10-20 LAB — MAGNESIUM: Magnesium: 1.6 mg/dL — ABNORMAL LOW (ref 1.7–2.4)

## 2016-10-20 LAB — GLUCOSE, CAPILLARY: GLUCOSE-CAPILLARY: 121 mg/dL — AB (ref 65–99)

## 2016-10-20 MED ORDER — GUAIFENESIN-DM 100-10 MG/5ML PO SYRP: 10.0000 mL | ORAL_SOLUTION | ORAL | Status: DC | PRN

## 2016-10-20 MED ORDER — DEXAMETHASONE SODIUM PHOSPHATE 4 MG/ML IJ SOLN
4.0000 mg | Freq: Two times a day (BID) | INTRAMUSCULAR | Status: DC
  Administered 2016-10-20 – 2016-10-21 (×3): 4 mg via INTRAVENOUS
  Filled 2016-10-20 (×3): qty 1

## 2016-10-20 MED ORDER — ALUM & MAG HYDROXIDE-SIMETH 200-200-20 MG/5ML PO SUSP: 60.0000 mL | ORAL | Status: DC | PRN

## 2016-10-20 MED ORDER — ONDANSETRON HCL 4 MG PO TABS: 4.0000 mg | ORAL_TABLET | Freq: Three times a day (TID) | ORAL | Status: DC | PRN

## 2016-10-20 MED ORDER — SENNOSIDES-DOCUSATE SODIUM 8.6-50 MG PO TABS: 1.0000 | ORAL_TABLET | Freq: Every evening | ORAL | Status: DC | PRN

## 2016-10-20 MED ORDER — ACETAMINOPHEN 325 MG PO TABS
650.0000 mg | ORAL_TABLET | ORAL | Status: DC | PRN
  Administered 2016-10-20: 650 mg via ORAL
  Filled 2016-10-20: qty 2

## 2016-10-20 MED ORDER — ENOXAPARIN SODIUM 40 MG/0.4ML ~~LOC~~ SOLN
40.0000 mg | SUBCUTANEOUS | Status: DC
Start: 2016-10-20 — End: 2016-11-01
  Administered 2016-10-20 – 2016-10-30 (×10): 40 mg via SUBCUTANEOUS
  Filled 2016-10-20 (×11): qty 0.4

## 2016-10-20 MED ORDER — POTASSIUM CHLORIDE CRYS ER 20 MEQ PO TBCR
20.0000 meq | EXTENDED_RELEASE_TABLET | Freq: Two times a day (BID) | ORAL | Status: DC
  Administered 2016-10-20 – 2016-10-23 (×5): 20 meq via ORAL
  Filled 2016-10-20 (×5): qty 1

## 2016-10-20 MED ORDER — LATANOPROST 0.005 % OP SOLN
1.0000 [drp] | Freq: Every day | OPHTHALMIC | Status: DC
  Administered 2016-10-20 – 2016-10-30 (×10): 1 [drp] via OPHTHALMIC
  Filled 2016-10-20: qty 2.5

## 2016-10-20 MED ORDER — HYDROCORTISONE 2.5 % RE CREA
1.0000 "application " | TOPICAL_CREAM | Freq: Two times a day (BID) | RECTAL | Status: DC | PRN
  Filled 2016-10-20: qty 28.35

## 2016-10-20 MED ORDER — MORPHINE SULFATE (PF) 4 MG/ML IV SOLN
1.0000 mg | INTRAVENOUS | Status: DC | PRN
  Administered 2016-10-21 – 2016-10-31 (×25): 1 mg via INTRAVENOUS
  Filled 2016-10-20 (×27): qty 1

## 2016-10-20 MED ORDER — POTASSIUM CHLORIDE 20 MEQ PO PACK: 20.0000 meq | PACK | Freq: Two times a day (BID) | ORAL | Status: DC

## 2016-10-20 MED ORDER — CEFAZOLIN SODIUM-DEXTROSE 1-4 GM/50ML-% IV SOLN
1.0000 g | Freq: Three times a day (TID) | INTRAVENOUS | Status: DC
  Administered 2016-10-20 – 2016-10-23 (×8): 1 g via INTRAVENOUS
  Filled 2016-10-20 (×10): qty 50

## 2016-10-20 MED ORDER — POTASSIUM CHLORIDE IN NACL 20-0.9 MEQ/L-% IV SOLN
INTRAVENOUS | Status: DC
  Administered 2016-10-20 – 2016-10-22 (×3): via INTRAVENOUS
  Filled 2016-10-20 (×4): qty 1000

## 2016-10-20 MED ORDER — CALCITONIN (SALMON) 200 UNIT/ACT NA SOLN
1.0000 | Freq: Every day | NASAL | Status: DC
  Administered 2016-10-21 – 2016-10-28 (×8): 1 via NASAL
  Filled 2016-10-20: qty 3.7

## 2016-10-20 MED ORDER — MAGNESIUM SULFATE 2 GM/50ML IV SOLN
2.0000 g | Freq: Once | INTRAVENOUS | Status: AC
  Administered 2016-10-20: 2 g via INTRAVENOUS
  Filled 2016-10-20: qty 50

## 2016-10-20 MED ORDER — LEVOTHYROXINE SODIUM 88 MCG PO TABS
88.0000 ug | ORAL_TABLET | Freq: Every day | ORAL | Status: DC
  Administered 2016-10-21 – 2016-10-30 (×10): 88 ug via ORAL
  Filled 2016-10-20 (×13): qty 1

## 2016-10-20 MED ORDER — SODIUM CHLORIDE 0.9 % IV SOLN
8.0000 mg | Freq: Three times a day (TID) | INTRAVENOUS | Status: DC | PRN
  Filled 2016-10-20: qty 4

## 2016-10-20 MED ORDER — INSULIN ASPART 100 UNIT/ML ~~LOC~~ SOLN
0.0000 [IU] | Freq: Three times a day (TID) | SUBCUTANEOUS | Status: DC
  Administered 2016-10-21 (×3): 8 [IU] via SUBCUTANEOUS
  Administered 2016-10-22 (×2): 2 [IU] via SUBCUTANEOUS
  Administered 2016-10-23 (×2): 8 [IU] via SUBCUTANEOUS
  Administered 2016-10-24: 2 [IU] via SUBCUTANEOUS
  Administered 2016-10-24: 16 [IU] via SUBCUTANEOUS
  Administered 2016-10-24 – 2016-10-25 (×3): 4 [IU] via SUBCUTANEOUS
  Administered 2016-10-25 – 2016-10-26 (×2): 2 [IU] via SUBCUTANEOUS
  Administered 2016-10-26 – 2016-10-27 (×2): 4 [IU] via SUBCUTANEOUS
  Administered 2016-10-27 – 2016-10-28 (×3): 2 [IU] via SUBCUTANEOUS
  Administered 2016-10-28 – 2016-10-29 (×2): 4 [IU] via SUBCUTANEOUS
  Administered 2016-10-29 (×2): 8 [IU] via SUBCUTANEOUS
  Administered 2016-10-31: 4 [IU] via SUBCUTANEOUS
  Administered 2016-10-31: 2 [IU] via SUBCUTANEOUS

## 2016-10-20 MED ORDER — ONDANSETRON 4 MG PO TBDP: 4.0000 mg | ORAL_TABLET | Freq: Three times a day (TID) | ORAL | Status: DC | PRN

## 2016-10-20 MED ORDER — ONDANSETRON HCL 4 MG/2ML IJ SOLN
4.0000 mg | Freq: Three times a day (TID) | INTRAMUSCULAR | Status: DC | PRN
  Filled 2016-10-20: qty 2

## 2016-10-20 NOTE — Addendum Note (Signed)
Encounter addended by: Ernst Spell, RN on: 10/20/2016 11:42 AM<BR>    Actions taken: Charge Capture section accepted

## 2016-10-20 NOTE — Progress Notes (Signed)
This encounter was created in error - please disregard.

## 2016-10-20 NOTE — H&P (Signed)
[facility] ADMISSION NOTE  Patient Care Team: Charolette Forward, MD as PCP - General (Cardiology) Eppie Gibson, MD as Attending Physician (Radiation Oncology) Heath Lark, MD as Consulting Physician (Hematology and Oncology) Leota Sauers, RN as Oncology Nurse Navigator  CHIEF COMPLAINTS/PURPOSE OF ADMISSION Malignant hypercalcemia, severe hypokalemia, significant disease progression with imminent airway compromise  HISTORY OF PRESENTING ILLNESS:  [name] 83 y.o. female is admitted for further management of several issues as above Summary of oncologic history as follows:   Cancer of anterior two-thirds of tongue ([place])   04/15/2016 Initial Diagnosis    The patient presents for evaluation of a left anterior tongue ulcer. She reports a several month history of gradually enlarging ulcerated lesion on the left anterior tongue, pain when chewing. She reports some intermittent bleeding and irritation. The patient reports weight loss over the last several months with difficulty chewing       04/22/2016 Imaging    CT neck: 1. 17 x 10 x 20 mm hyperdense mass lesion in the left anterior tongue associated with the area of ulceration. 2. No significant invasion of the deep tongue musculature or floor of mouth. 3. No significant metastatic disease. 4. Degenerative changes within the cervical spine, worse on the right. 5. Calcified right thyroid nodule. Recommend further evaluation with thyroid ultrasound. If patient is clinically hyperthyroid, consider nuclear medicine thyroid uptake and scan.       05/14/2016 Surgery    Wide local excision, anterior 2/3 left tongue with reconstruction.      05/14/2016 Pathology Results    1. Tongue, biopsy, Anterior deep margin left - BENIGN SALIVARY GLAND-TYPE TISSUE AND SKELETAL MUSCLE. - THERE IS NO EVIDENCE OF MALIGNANCY. 2. Tongue, resection for tumor, Left lateral - INVASIVE SQUAMOUS CELL CARCINOMA, WELL DIFFERENTIATED, SPANNING 3.7  CM. - INVASIVE CARCINOMA IS BROADLY PRESENT AT THE DEEP MARGIN OF SPECIMEN #2. - PERINEURAL INVASION IS IDENTIFIED. - SEE ONCOLOGY TABLE BELOW.      06/12/2016 Surgery    Revision left hemiglossectomy      06/12/2016 Pathology Results    1. Tongue, resection for tumor, left lateral - BENIGN SQUAMOUS LINED MUCOSA WITH FOREIGN BODY GIANT CELL REACTION, CONSISTENT WITH PRIOR SURGICAL PROCEDURE. - THERE IS NO EVIDENCE OF MALIGNANCY. 2. Tongue, biopsy, deep margin, left - BENIGN SKELETAL MUSCLE. - THERE IS NO EVIDENCE OF MALIGNANCY.      09/22/2016 Imaging    Ct chest: 1. 4 mm left upper lobe partially calcified pulmonary nodule, suspect granuloma. No other pulmonary nodules are visualized. No significant mediastinal or hilar adenopathy. 2. Indeterminate 14 mm mass in the left adrenal gland. Further evaluation with adrenal CT or MRI may be obtained for further evaluation. 3. Thrombosis of the left internal jugular vein. Ill-defined soft tissue thickening and stranding anterior to the left jugular vein and around the inferior left sternocleidomastoid muscle. 4. Gallstones      09/22/2016 Imaging    CT neck: . 1. New bulky bilateral necrotic lymphadenopathy. Left IJ chain node measures up to 53 mm and invades the IJ with tumor thrombus extending to the subclavian confluence. This node also invades the supraglottic larynx and left sternocleidomastoid. 2. Oral tongue primary with possible residual/recurrent tumor along the right margin.       10/06/2016 PET scan    1. Bulky hypermetabolic bilateral neck adenopathy compatible with malignancy. These nodes are so large that they significantly narrow the supraglottic airway and could threaten the airway if further swelling or nodal enlargement occurs.  I do not see a separate tongue mass (the original density along the left anterior tongue shown on 04/22/2016 is no longer seen) although the dominant right level Ib lymph node abuts the right tongue base.  2. 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 3. Hepatic morphology raises the possibility of cirrhosis, correlate with clinical history. Trace ascites. 4. 4 mm left upper lobe nodule is not changed from 09/22/2016, is not hypermetabolic but is below sensitive PET-CT size thresholds. 5. Other imaging findings of potential clinical significance:  Coronary, aortic arch, and branch vessel atherosclerotic vascular disease. Aortoiliac atherosclerotic vascular disease. Cholelithiasis. Old granulomatous disease. Sigmoid diverticulosis. Pelvic floor laxity. Perineal activity compatible with urinary incontinence. Considerable chronic sacroiliitis      Since our last visit, the patient has made informed decision to proceed with palliative radiation treatment only. Yesterday, she presented to the radiation department for blood work. She was noted to have leukocytosis, significant electrolyte abnormalities with malignant hypercalcemia and severe hypokalemia. She was directed to the emergency department for further evaluation She received IV potassium replacement therapy along with repeat CT scan which showed imminent airway compromise.  Surprisingly, the patient was discharged from the hospital Today, I have spoken with the radiation oncologist with plan to see her and she is directly admitted for further evaluation of problems as above Since I saw her, she had progressive severe dysphagia and throat pain. The tumor masses has enlarged significantly and the right neck lymphadenopathy has started to breakdown with serosanguineous discharge This has happened over the past week She is noted to have mild stridor at times when she breathes She is also noted to have leukocytosis yesterday.  She denies fever or chills. Appetite is poor After significant discussion with patient and  family member, we have made informed decision to admit her directly for further management  MEDICAL HISTORY:  Past Medical History:  Diagnosis Date  . Abdominal pain   . Blood dyscrasia    HYPOKALEMIA  . Blood transfusion   . COPD (chronic obstructive pulmonary disease) ([place])   . Dementia    pathologic aging and dementia per daughter  . Diabetes mellitus    Type II  . Heart murmur   . History of blood transfusion   . Hypertension   . Hypothyroid   . Neuropathy   . Substance abuse    ETOH with dementia and prior encephalopathy and withdrawal  . Tongue cancer ([place]) 05/13/2016  . Tricuspid regurgitation 2012   mild  . Urinary tract infection     SURGICAL HISTORY: Past Surgical History:  Procedure Laterality Date  . ABDOMINAL HYSTERECTOMY     1970's  . BREAST SURGERY Left    Lumpectomy  . COLONOSCOPY    . ESOPHAGOGASTRODUODENOSCOPY  05/17/2011   Procedure: ESOPHAGOGASTRODUODENOSCOPY (EGD);  Surgeon: Lafayette Dragon, MD;  Location: [facility] ENDOSCOPY;  Service: Endoscopy;  Laterality: N/A;  . EXCISION OF TONGUE LESION Left 05/13/2016   Procedure: WIDE LOCAL EXCISION OF LEFT LATERAL TONGUE CANCER;  Surgeon: Jerrell Belfast, MD;  Location: [facility];  Service: ENT;  Laterality: Left;  . EXCISION OF TONGUE LESION Left 06/12/2016   Procedure: RE-EXCISION OF LEFT  TONGUE CANCER;  Surgeon: Jerrell Belfast, MD;  Location: [facility];  Service: ENT;  Laterality: Left;  . EYE SURGERY Bilateral    Cataract  . TONGUE SURGERY  05/13/2016   WIDE LOCAL EXCISION OF LEFT LATERAL  TONGUE CANCER (Left) with Local Soft Tissue Reconstruction   . TONSILLECTOMY AND ADENOIDECTOMY      SOCIAL HISTORY: Social History   Social History  . Marital status: Divorced    Spouse name: N/A  . Number of children: 2  . Years of education: N/A   Occupational History  . retired Marine scientist    Social History Main Topics  . Smoking status: Current Every Day Smoker    Packs/day: 0.50    Years: 30.00    Types: Cigarettes   . Smokeless tobacco: Never Used  . Alcohol use Yes     Comment: social  . Drug use: No  . Sexual activity: No   Other Topics Concern  . Not on file   Social History Narrative  . No narrative on file    FAMILY HISTORY: Family History  Problem Relation Age of Onset  . Stroke Mother     ALLERGIES:  is allergic to tuberculin tests.  MEDICATIONS:  Current Facility-Administered Medications  Medication Dose Route Frequency Provider Last Rate Last Dose  . 0.9 % NaCl with KCl 20 mEq/ L  infusion   Intravenous Continuous Heath Lark, MD      . acetaminophen (TYLENOL) tablet 650 mg  650 mg Oral Q4H PRN Heath Lark, MD      . alum & mag hydroxide-simeth (MAALOX/MYLANTA) 200-200-20 MG/5ML suspension 60 mL  60 mL Oral Q4H PRN Zaylah Blecha, MD      . enoxaparin (LOVENOX) injection 40 mg  40 mg Subcutaneous Q24H Adriel Kessen, MD      . guaiFENesin-dextromethorphan (ROBITUSSIN DM) 100-10 MG/5ML syrup 10 mL  10 mL Oral Q4H PRN Heath Lark, MD      . hydrocortisone (ANUSOL-HC) 2.5 % rectal cream 1 application  1 application Rectal BID PRN Heath Lark, MD      . insulin aspart (novoLOG) injection 0-24 Units  0-24 Units Subcutaneous TID WC Xaviar Lunn, MD      . latanoprost (XALATAN) 0.005 % ophthalmic solution 1 drop  1 drop Both Eyes QHS Heath Lark, MD      . Derrill Memo ON 10/21/2016] levothyroxine (SYNTHROID, LEVOTHROID) tablet 88 mcg  88 mcg Oral QAC breakfast Tagan Bartram, MD      . morphine 4 MG/ML injection 1 mg  1 mg Intravenous Q2H PRN Alda Gaultney, MD      . ondansetron (ZOFRAN) tablet 4-8 mg  4-8 mg Oral Q8H PRN Heath Lark, MD       Or  . ondansetron (ZOFRAN-ODT) disintegrating tablet 4-8 mg  4-8 mg Oral Q8H PRN Heath Lark, MD       Or  . ondansetron (ZOFRAN) injection 4 mg  4 mg Intravenous Q8H PRN Lavanda Nevels, MD       Or  . ondansetron (ZOFRAN) 8 mg in sodium chloride 0.9 % 50 mL IVPB  8 mg Intravenous Q8H PRN Laloni Rowton, MD      . senna-docusate (Senokot-S) tablet 1 tablet  1 tablet Oral QHS PRN Heath Lark, MD        REVIEW OF SYSTEMS:   Constitutional: Denies fevers, chills or abnormal night sweats Eyes: Denies blurriness of vision, double vision or watery eyes Ears, nose, mouth, throat, and face: Denies mucositis or sore throat Cardiovascular: Denies palpitation, chest discomfort or lower extremity swelling Gastrointestinal:  Denies nausea, heartburn or change in bowel habits Skin: Denies abnormal skin rashes Neurological:Denies numbness, tingling or new weaknesses Behavioral/Psych: Mood is stable, no new changes  All other systems were  reviewed with the patient and are negative.  PHYSICAL EXAMINATION: ECOG PERFORMANCE STATUS: 1 - Symptomatic but completely ambulatory  Vitals:   10/20/16 1625  BP: (!) 115/95  Pulse: 84  Resp: 18  Temp: 99 F (37.2 C)   There were no vitals filed for this visit.  GENERAL:alert, no distress and comfortable.  Noted noisy breathing SKIN: skin color, texture, turgor are normal, no rashes or significant lesions EYES: normal, conjunctiva are pink and non-injected, sclera clear OROPHARYNX: Noted tongue resection.  Significant secretion is noted in her airway NECK: supple with large tumor masses LYMPH: Significant enlargement of bilateral neck lymphadenopathy.  The right lymph node has started to bleed.  I replaced the area with clean dressing change  LUNGS: Bilateral crackles are audible on exam HEART: regular rate & rhythm and no murmurs and no lower extremity edema ABDOMEN:abdomen soft, non-tender and normal bowel sounds Musculoskeletal:no cyanosis of digits and no clubbing  PSYCH: alert & oriented x 3 with fluent speech NEURO: no focal motor/sensory deficits  LABORATORY DATA:  I have reviewed the data as listed Lab Results  Component Value Date   WBC 36.1 (H) 10/20/2016   HGB 11.8 (L) 10/20/2016   HCT 34.9 (L) 10/20/2016   MCV 81.4 10/20/2016   PLT 290 10/20/2016    Recent Labs  05/13/16 0746 06/05/16 1320 06/12/16 1244  10/19/16 1306 10/20/16 1627  NA 143 142  --  145 135  K 3.1* 3.6  --  2.9 Repeated and Verified* 2.4*  CL 109 110  --   --  95*  CO2 23 23  --  30* 30  GLUCOSE 122* 160*  --  153* 142*  BUN 7 8  --  11.3 7  CREATININE 0.67 0.70 0.66 0.8 0.52  CALCIUM 9.4 9.4  --  12.6* 11.0*  GFRNONAA >60 >60 >60  --  >60  GFRAA >60 >60 >60  --  >60  PROT  --   --   --   --  8.0  ALBUMIN  --   --   --   --  3.5  AST  --   --   --   --  16  ALT  --   --   --   --  12*  ALKPHOS  --   --   --   --  114  BILITOT  --   --   --   --  0.4    RADIOGRAPHIC STUDIES: I have personally reviewed the radiological images as listed and agreed with the findings in the report. Ct Soft Tissue Neck W Contrast  Result Date: 10/19/2016 CLINICAL DATA:  83 y/o  F; EXAM: CT NECK WITH CONTRAST TECHNIQUE: Multidetector CT imaging of the neck was performed using the standard protocol following the bolus administration of intravenous contrast. CONTRAST:  21mL ISOVUE-300 IOPAMIDOL (ISOVUE-300) INJECTION 61% COMPARISON:  10/06/2016 PET-CT.  09/22/2016 CT of the neck. FINDINGS: Pharynx and larynx: Mass effect upon the airway which is displaced rightward and moderately narrowed at the level of the hypopharynx measuring 2.1 x 0.4 cm. Salivary glands: No inflammation, mass, or stone. Thyroid: Multiple thyroid nodules including a densely calcified nodule in right lobe of thyroid are stable. Lymph nodes: Massive necrotic cervical lymphadenopathy of right 2,3,5 and left 2,3,4,5 lymph node stations markedly increased in size when compared with prior studies, for example a left-sided 2/3 level node measures 8.5 x 7.9 x 7.5 cm (AP x ML x CC series 2: Image 54 and  6:69), previously up to 5.3 cm. The necrotic lymph nodes have ill-defined margins with surrounding fat stranding indicating extranodal extension. The large right upper cervical lymph node at angle of mandible invades the right masticator space and has an ill-defined margin with the right  submandibular gland. Go the largest left upper cervical lymph node extends medially into the left carotid space, parapharyngeal space on, and has an ill-defined margin with both the left submandibular gland and parotid gland. Vascular: Left internal jugular vein thrombus, suspected tumor thrombus, now extends to the junction with the left brachiocephalic vein inferiorly and the craniocervical junction superiorly, progressed from prior studies. Limited intracranial: Negative. Visualized orbits: Negative. Mastoids and visualized paranasal sinuses: Clear. Skeleton: Stable cervical spondylosis. No high-grade bony canal stenosis. No destructive osseous lesion is identified. Upper chest: Stable left upper lobe 4 mm nodule (series 2, image 88). Other: None. IMPRESSION: 1. Massive necrotic cervical lymphadenopathy is increased in size. For example, the largest left-sided node measures up to 8.5 cm, previously 5.3 cm. 2. Findings of extranodal extension of lymphadenopathy and probable invasion of the left internal jugular vein now extending to junction with left brachiocephalic vein and craniocervical junction. 3. Mass effect on the airway which is displaced rightward and moderately narrow the level of hypopharynx. 4. Stable left upper lobe 4 mm nodule. Electronically Signed   By: Kristine Garbe M.D.   On: 10/19/2016 17:54   Ct Soft Tissue Neck W Contrast  Result Date: 09/22/2016 CLINICAL DATA:  History of tongue cancer.  Neck swelling. EXAM: CT NECK WITH CONTRAST TECHNIQUE: Multidetector CT imaging of the neck was performed using the standard protocol following the bolus administration of intravenous contrast. Creatinine was obtained on site at [facility] at [address] Results: Creatinine 0.6 mg/dL. CONTRAST:  74mL ISOVUE-300 IOPAMIDOL (ISOVUE-300) INJECTION 61% COMPARISON:  04/22/2016 FINDINGS: Pharynx and larynx: Status post resection of oral tongue cancer. There is nodular enhancement along  the right aspect of the surgical margin measuring up to 10 mm. Salivary glands: The right submandibular gland is posteriorly displaced in the left submandibular gland is anteriorly displaced by bulky necrotic adenopathy. No primary disease suspected. The right submandibular gland may be invaded. Thyroid: Predominately calcified right-sided nodule. Subcentimeter left-sided nodule. Lymph nodes: New necrotic lymphadenopathy with right submandibular node measuring 4 cm and showing gross extracapsular tumor. Left cervical chain node measures 53 mm and also has extensive extracapsular tumor with IJ invasion causing tumor thrombus to the level of the subclavian confluence. This node also infiltrates the left supraglottic larynx pharynx (definitely into strap muscles and reaching the paraglottic fat) and sternocleidomastoid. Smaller necrotic right level 2 lymph node. Vascular: Left IJ invasion and tumor thrombus as above. Limited intracranial: Negative Visualized orbits: Not seen Mastoids and visualized paranasal sinuses: Clear Skeleton: Right submandibular adenopathy contacts the mandibular body, without invasion. No hematogenous osseous metastasis identified. Upper chest: Reported separately Other: Subcutaneous reticulation in the neck which could be related to radiotherapy or venous obstruction. These results will be called to the ordering clinician or representative by the Radiologist Assistant, and communication documented in the PACS or zVision Dashboard. IMPRESSION: 1. New bulky bilateral necrotic lymphadenopathy. Left IJ chain node measures up to 53 mm and invades the IJ with tumor thrombus extending to the subclavian confluence. This node also invades the supraglottic larynx and left sternocleidomastoid. 2. Oral tongue primary with possible residual/recurrent tumor along the right margin. Electronically Signed   By: Monte Fantasia M.D.   On: 09/22/2016 14:35  Ct Chest W Contrast  Result Date:  09/22/2016 CLINICAL DATA:  Neck mass EXAM: CT CHEST WITH CONTRAST TECHNIQUE: Multidetector CT imaging of the chest was performed during intravenous contrast administration. CONTRAST:  9mL ISOVUE-300 IOPAMIDOL (ISOVUE-300) INJECTION 61% COMPARISON:  04/22/2016, neck CT 09/22/2016 FINDINGS: Cardiovascular: Atherosclerotic vascular calcification of the aorta. No aneurysmal dilatation. Irregular mural thrombus in the descending thoracic aorta. Thrombus present within the left internal jugular vein to the subclavian confluence. Coronary artery calcification. Trace pericardial effusion. Mild cardiomegaly. Mediastinum/Nodes: Midline trachea. Multiple coarse calcifications in the right lobe of the thyroid with 17 mm peripherally calcified mass in the right lobe. Subcentimeter hypodense nodule left lobe of the thyroid. Ill-defined soft tissue density and stranding at the base of left neck and around the sternocleidomastoid muscle. Esophagus grossly unremarkable. No axillary adenopathy. No significant mediastinal or hilar adenopathy. Lungs/Pleura: Partially calcified 4 mm pulmonary nodule in the left upper lobe, suspect granuloma. No other nodules are visualized. No consolidation or pleural effusion. Upper Abdomen: No focal hepatic abnormalities in the imaged liver. Calcified stones in the gallbladder. Splenic granuloma. 14 mm indeterminate mass in the left adrenal gland. Slightly narrowed appearance of the upper abdominal aorta with atherosclerosis. Musculoskeletal: Degenerative changes. No acute or suspicious bone lesions are visualized. IMPRESSION: 1. 4 mm left upper lobe partially calcified pulmonary nodule, suspect granuloma. No other pulmonary nodules are visualized. No significant mediastinal or hilar adenopathy. 2. Indeterminate 14 mm mass in the left adrenal gland. Further evaluation with adrenal CT or MRI may be obtained for further evaluation. 3. Thrombosis of the left internal jugular vein. Ill-defined soft tissue  thickening and stranding anterior to the left jugular vein and around the inferior left sternocleidomastoid muscle. 4. Gallstones Electronically Signed   By: Donavan Foil M.D.   On: 09/22/2016 15:22   Nm Pet Image Initial (pi) Skull Base To Thigh  Addendum Date: 10/06/2016   ADDENDUM REPORT: 10/06/2016 14:08 ADDENDUM: The original report was by Dr. Van Clines. The following addendum is by Dr. Van Clines: I discussed these findings by telephone with Dr. Wilburn Cornelia at 1:50 p.m. on 10/06/2016. Electronically Signed   By: Van Clines M.D.   On: 10/06/2016 14:08   Result Date: 10/06/2016 CLINICAL DATA:  Initial treatment strategy for tongue cancer with lymph node metastatic disease. EXAM: NUCLEAR MEDICINE PET SKULL BASE TO THIGH TECHNIQUE: 5.4 mCi F-18 FDG was injected intravenously. Full-ring PET imaging was performed from the skull base to thigh after the radiotracer. CT data was obtained and used for attenuation correction and anatomic localization. FASTING BLOOD GLUCOSE:  Value: 165 mg/dl COMPARISON:  Multiple exams, including 09/22/2016 and 05/27/2011 CT scans FINDINGS: NECK A left cervical node spanning levels IIa, IIb, and III measures 6.8 cm in short axis and has a necrotic center with hypermetabolic rim, maximum SUV 17.1. This distorts and displaces the left lateral limb of the hyoid bone. There appears to be some less certain nodularity/adenopathy along the upper margin of this lesion which extends from tangential to the mandible down to tangential to the clavicle. Just below the angle of the right mandible there is a level Ib lymph node measuring 4.2 cm in short axis with maximum SUV 19.1. The medial margin of this lesion abuts the right tongue base in the posteromedial margin abuts the right side of the hyoid bone. Aside from this mass along the right side of the tongue base, I do not see a separate hypermetabolic tongue mass. A right station IIa lymph node measuring 1.8 cm in  diameter has a maximum SUV of 15.1. The combination of this bulky adenopathy significantly narrows the supraglottic trachea, causing a slit like appearance for example on image 28/4. 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 Low-density left internal jugular vein is severely effaced by the massive left neck adenopathy and probably thrombosed as well. Today' s exam was not performed with IV contrast and accordingly venous assessment is problematic. CHEST No hypermetabolic mediastinal or hilar nodes. 4 mm left upper lobe nodule image 15/8, not currently hypermetabolic, no change from 44/06/270. Coronary, aortic arch, and branch vessel atherosclerotic vascular disease. ABDOMEN/PELVIS No abnormal hypermetabolic activity within the liver, pancreas, adrenal glands, or spleen. No hypermetabolic lymph nodes in the abdomen or pelvis. Cholelithiasis. Hepatic morphology is somewhat lobular in raises the possibility of cirrhosis, correlate with clinical history. Trace perihepatic fluid along the right hepatic lobe for example image 102/4. No splenomegaly but there are splenic calcifications suggesting old granulomatous disease. Faint calcification along the slightly nodular left adrenal gland common no change from 5366, no hypermetabolic activity, thought to likely be benign. Aortoiliac atherosclerotic vascular disease. Sigmoid colon diverticulosis without active diverticulitis. Pelvic floor laxity. Hypermetabolic activity along the perineum is most compatible with incontinence. Trace amount of free pelvic fluid. Appendix normal. SKELETON No focal hypermetabolic activity to suggest skeletal metastasis. Prominent sclerosis and lucency along the sacroiliac joints compatible with considerable chronic sacroiliitis, but without current hypermetabolic activity. IMPRESSION: 1. Bulky hypermetabolic bilateral neck adenopathy compatible with malignancy. These nodes are so large that they significantly  narrow the supraglottic airway and could threaten the airway if further swelling or nodal enlargement occurs. I do not see a separate tongue mass (the original density along the left anterior tongue shown on 04/22/2016 is no longer seen) although the dominant right level Ib lymph node abuts the right tongue base. 2. 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 3. Hepatic morphology raises the possibility of cirrhosis, correlate with clinical history. Trace ascites. 4. 4 mm left upper lobe nodule is not changed from 09/22/2016, is not hypermetabolic but is below sensitive PET-CT size thresholds. 5. Other imaging findings of potential clinical significance: Coronary, aortic arch, and branch vessel atherosclerotic vascular disease. Aortoiliac atherosclerotic vascular disease. Cholelithiasis. Old granulomatous disease. Sigmoid diverticulosis. Pelvic floor laxity. Perineal activity compatible with urinary incontinence. Considerable chronic sacroiliitis. Radiology assistant personnel have been notified to put me in telephone contact with the referring physician or the referring physician's clinical representative in order to discuss these findings. Once this communication is established I will issue an addendum to this report for documentation purposes. Electronically Signed: By: Van Clines M.D. On: 10/06/2016 13:34    ASSESSMENT & PLAN:   Tongue cancer ([place]) The patient has complex past medical history including renal failure on hemodialysis (which has since resolved), severe substance abuse especially with alcoholism (has been abstinent) and background history of dementia. I have read a lot of the past medical records, in collaboration with history of from her daughter who appears to be the principal caregiver. In essence, this patient has rapidly progressive disease in  the neck with impeding airway compromise without further treatment. Due to her significant comorbidities, I am not enthusiastic to prescribe aggressive chemotherapy The plan would be for her to proceed with radiation treatment only. Her radiation simulation program will be rescheduled to tomorrow  Malignant hypercalcemia We will start her on IV fluid  resuscitation, IV dexamethasone, nasal calcitonin and Lasix tomorrow  Imminent airway compromise with mild stridor and excessive airway secretion Due to disease progression I will start her on dexamethasone IV twice a day and monitor carefully  Possible aspiration We will get formal swallow evaluation tomorrow CXR to exclude pneumonia I will keep her on liquid diet only for now  Leukocytosis with necrotic tumor with serosanguineous discharge/cellulitis We will start her on Ancef IV and continue dry dressing changes  Severe hypokalemia Likely due to poor oral intake and severe hypercalcemia We will replace with IV magnesium, and IV potassium and liquid potassium  Severe cancer pain We will start her on gentle dose of IV morphine  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 After extensive review of prior evaluation by neurologist from [place], I felt that the patient indeed may have age related dementia but appears to be coping well with aggressive social support  CODE STATUS DNR  Goals of care, counseling/discussion The patient is aware she has incurable disease and treatment is strictly palliative. We discussed importance of Advanced Directives and Living will. Her daughter appears to be the healthcare medical power of attorney  Discharge planning The patient had multiple reason to be admitted, namely malignant hypercalcemia, severe hypokalemia, leukocytosis with draining necrotic tumor and  imminent airway compromise.  The risk of not admitting her would be risk of death She will likely to be here the next 3-5 days   All questions were answered. The patient knows to call the clinic with any problems, questions or concerns.    Heath Lark, MD 10/20/2016 5:29 PM

## 2016-10-20 NOTE — Telephone Encounter (Signed)
Called patient's daughter- Latina Craver to inform of an  appt. Today, lvm for a return call

## 2016-10-20 NOTE — Telephone Encounter (Signed)
Call received from patient's daughter Latina Craver [phone]) who is "en route from [facility] to meet mom here for appointment with Dr. Alvy Bimler.  Could Dr. Alvy Bimler or her nurse call [place] at [phone].  Mom' has no phone service due to [facility].  She is mentally challenged with Dementia.  She is refusing to come in and doesn't believe she needs to be seen.  She said unless she talks with the office and is told she needs to come in she is staying at home.  EMS has been called and we may have to call them back if she decides to go with them."  This nurse called, spoke with patient identifying self as Triage nurse.  "Dr.Gorsuch, I was given potassium last night.  I feel fine.  Please give me an appointment for tomorrow.  I want to rest and sleep today.  Please don't take me off your books if I do not come in."  Dr. Alvy Bimler notified of call.  Verbal order received and read back from [place] that we cannot make her come in but she needs to be seen today for further evaluation of yesterday's event.  This nurse encouraged patient to come in as scheduled at 3:00 pm, not to make sure she is not removed from appointment books but to make sure she stays well above ground.  "Okay I'll get dressed and come in."  Jovial at end of call.  Maggie thanked me for taking with Ms. Megan Salon.

## 2016-10-20 NOTE — Progress Notes (Signed)
CRITICAL VALUE ALERT  Critical value received:  K+  Date of notification:  10/20/16  Time of notification:  9373  Critical value read back:Yes.    Nurse who received alert:  M. Jaizon Deroos  MD notified (1st page):  Not notified, trending and treatment ordered.    Time of first page:    MD notified (2nd page):  Time of second page:  Responding MD:    Time MD responded:

## 2016-10-21 ENCOUNTER — Ambulatory Visit: Admission: RE | Admit: 2016-10-21 | Payer: Medicare Other | Source: Ambulatory Visit | Admitting: Radiation Oncology

## 2016-10-21 ENCOUNTER — Inpatient Hospital Stay (HOSPITAL_COMMUNITY): Payer: Medicare Other

## 2016-10-21 LAB — CBC
HCT: 36 % (ref 36.0–46.0)
Hemoglobin: 11.7 g/dL — ABNORMAL LOW (ref 12.0–15.0)
MCH: 26.8 pg (ref 26.0–34.0)
MCHC: 32.5 g/dL (ref 30.0–36.0)
MCV: 82.6 fL (ref 78.0–100.0)
Platelets: 328 10*3/uL (ref 150–400)
RBC: 4.36 MIL/uL (ref 3.87–5.11)
RDW: 15.5 % (ref 11.5–15.5)
WBC: 40.9 10*3/uL — ABNORMAL HIGH (ref 4.0–10.5)

## 2016-10-21 LAB — COMPREHENSIVE METABOLIC PANEL
ALT: 10 U/L — ABNORMAL LOW (ref 14–54)
AST: 13 U/L — AB (ref 15–41)
Albumin: 3.2 g/dL — ABNORMAL LOW (ref 3.5–5.0)
Alkaline Phosphatase: 114 U/L (ref 38–126)
Anion gap: 11 (ref 5–15)
BILIRUBIN TOTAL: 0.4 mg/dL (ref 0.3–1.2)
BUN: 10 mg/dL (ref 6–20)
CO2: 28 mmol/L (ref 22–32)
Calcium: 10.9 mg/dL — ABNORMAL HIGH (ref 8.9–10.3)
Chloride: 101 mmol/L (ref 101–111)
Creatinine, Ser: 0.65 mg/dL (ref 0.44–1.00)
Glucose, Bld: 242 mg/dL — ABNORMAL HIGH (ref 65–99)
POTASSIUM: 3.2 mmol/L — AB (ref 3.5–5.1)
Sodium: 140 mmol/L (ref 135–145)
TOTAL PROTEIN: 7.6 g/dL (ref 6.5–8.1)

## 2016-10-21 LAB — GLUCOSE, CAPILLARY
GLUCOSE-CAPILLARY: 229 mg/dL — AB (ref 65–99)
GLUCOSE-CAPILLARY: 213 mg/dL — AB (ref 65–99)
Glucose-Capillary: 247 mg/dL — ABNORMAL HIGH (ref 65–99)
Glucose-Capillary: 270 mg/dL — ABNORMAL HIGH (ref 65–99)

## 2016-10-21 LAB — HIV ANTIBODY (ROUTINE TESTING W REFLEX): HIV Screen 4th Generation wRfx: NONREACTIVE

## 2016-10-21 MED ORDER — HYDRALAZINE HCL 10 MG PO TABS: 10.0000 mg | ORAL_TABLET | Freq: Three times a day (TID) | ORAL | Status: DC | PRN

## 2016-10-21 MED ORDER — LISINOPRIL 5 MG PO TABS
5.0000 mg | ORAL_TABLET | Freq: Every day | ORAL | Status: DC
Start: 2016-10-21 — End: 2016-10-21
  Administered 2016-10-21: 5 mg via ORAL
  Filled 2016-10-21: qty 1

## 2016-10-21 MED ORDER — INSULIN GLARGINE 100 UNIT/ML ~~LOC~~ SOLN
6.0000 [IU] | Freq: Every day | SUBCUTANEOUS | Status: DC
  Administered 2016-10-21: 6 [IU] via SUBCUTANEOUS
  Filled 2016-10-21 (×2): qty 0.06

## 2016-10-21 MED ORDER — INSULIN ASPART 100 UNIT/ML ~~LOC~~ SOLN
4.0000 [IU] | Freq: Once | SUBCUTANEOUS | Status: AC
  Administered 2016-10-21: 4 [IU] via SUBCUTANEOUS

## 2016-10-21 MED ORDER — ATENOLOL 25 MG PO TABS
25.0000 mg | ORAL_TABLET | Freq: Every day | ORAL | Status: DC
  Administered 2016-10-21 – 2016-10-23 (×3): 25 mg via ORAL
  Filled 2016-10-21 (×3): qty 1

## 2016-10-21 MED ORDER — AMLODIPINE BESYLATE 10 MG PO TABS
10.0000 mg | ORAL_TABLET | Freq: Every day | ORAL | Status: DC
  Administered 2016-10-21 – 2016-10-24 (×4): 10 mg via ORAL
  Filled 2016-10-21 (×4): qty 1

## 2016-10-21 MED ORDER — ORAL CARE MOUTH RINSE
15.0000 mL | Freq: Two times a day (BID) | OROMUCOSAL | Status: DC
  Administered 2016-10-22 – 2016-10-31 (×15): 15 mL via OROMUCOSAL

## 2016-10-21 MED ORDER — MAGNESIUM SULFATE 2 GM/50ML IV SOLN
2.0000 g | Freq: Once | INTRAVENOUS | Status: AC
  Administered 2016-10-21: 2 g via INTRAVENOUS
  Filled 2016-10-21: qty 50

## 2016-10-21 NOTE — Progress Notes (Signed)
Pt transferred to room 1335. Pt stable from am assessment. No c/o pain. No distress noted. Will continue to monitor.

## 2016-10-21 NOTE — Progress Notes (Signed)
[name]   DOB:04-06-1934   TZ#:[id number]    Subjective: She is feeling better.  Continue to have serosanguineous drainage from her neck.  She felt that the swelling has improved.  The case was discussed at ENT tumor board this morning  Objective:  Vitals:   10/21/16 0630 10/21/16 1523  BP: (!) 178/80 (!) 170/71  Pulse: 85 70  Resp: (!) 22 16  Temp: 98.6 F (37 C) 98.5 F (36.9 C)     Intake/Output Summary (Last 24 hours) at 10/21/16 1629 Last data filed at 10/21/16 1300  Gross per 24 hour  Intake          1803.75 ml  Output                0 ml  Net          1803.75 ml    GENERAL:alert, no distress and comfortable SKIN: skin color, texture, turgor are normal, no rashes or significant lesions EYES: normal, Conjunctiva are pink and non-injected, sclera clear OROPHARYNX: Continue to have significant oral secretion on exam l  NECK: supple, thyroid normal size, non-tender, without nodularity LYMPH: Significant enlarged lymph node with serosanguineous drainage coming from the right neck LUNGS: clear to auscultation and percussion with normal breathing effort HEART: regular rate & rhythm and no murmurs and no lower extremity edema ABDOMEN:abdomen soft, non-tender and normal bowel sounds Musculoskeletal:no cyanosis of digits and no clubbing  NEURO: alert & oriented x 3 with fluent speech, no focal motor/sensory deficits   Labs:  Lab Results  Component Value Date   WBC 40.9 (H) 10/21/2016   HGB 11.7 (L) 10/21/2016   HCT 36.0 10/21/2016   MCV 82.6 10/21/2016   PLT 328 10/21/2016   NEUTROABS 31.7 (H) 10/20/2016    Lab Results  Component Value Date   NA 140 10/21/2016   K 3.2 (L) 10/21/2016   CL 101 10/21/2016   CO2 28 10/21/2016    Studies:  X-ray Chest Pa And Lateral  Result Date: 10/20/2016 CLINICAL DATA:  83 year old female with dry cough. EXAM: CHEST  2 VIEW COMPARISON:  January 31, 2013 FINDINGS: The heart size is mildly enlarged. The hila and mediastinum are  normal. No pulmonary nodules, masses, or focal infiltrates. No acute abnormalities. IMPRESSION: No active cardiopulmonary disease. Electronically Signed   By: Dorise Bullion III M.D   On: 10/20/2016 20:02   Ct Soft Tissue Neck W Contrast  Result Date: 10/19/2016 CLINICAL DATA:  83 y/o  F; EXAM: CT NECK WITH CONTRAST TECHNIQUE: Multidetector CT imaging of the neck was performed using the standard protocol following the bolus administration of intravenous contrast. CONTRAST:  17mL ISOVUE-300 IOPAMIDOL (ISOVUE-300) INJECTION 61% COMPARISON:  10/06/2016 PET-CT.  09/22/2016 CT of the neck. FINDINGS: Pharynx and larynx: Mass effect upon the airway which is displaced rightward and moderately narrowed at the level of the hypopharynx measuring 2.1 x 0.4 cm. Salivary glands: No inflammation, mass, or stone. Thyroid: Multiple thyroid nodules including a densely calcified nodule in right lobe of thyroid are stable. Lymph nodes: Massive necrotic cervical lymphadenopathy of right 2,3,5 and left 2,3,4,5 lymph node stations markedly increased in size when compared with prior studies, for example a left-sided 2/3 level node measures 8.5 x 7.9 x 7.5 cm (AP x ML x CC series 2: Image 54 and 6:69), previously up to 5.3 cm. The necrotic lymph nodes have ill-defined margins with surrounding fat stranding indicating extranodal extension. The large right upper cervical lymph node at angle of mandible  invades the right masticator space and has an ill-defined margin with the right submandibular gland. Go the largest left upper cervical lymph node extends medially into the left carotid space, parapharyngeal space on, and has an ill-defined margin with both the left submandibular gland and parotid gland. Vascular: Left internal jugular vein thrombus, suspected tumor thrombus, now extends to the junction with the left brachiocephalic vein inferiorly and the craniocervical junction superiorly, progressed from prior studies. Limited  intracranial: Negative. Visualized orbits: Negative. Mastoids and visualized paranasal sinuses: Clear. Skeleton: Stable cervical spondylosis. No high-grade bony canal stenosis. No destructive osseous lesion is identified. Upper chest: Stable left upper lobe 4 mm nodule (series 2, image 88). Other: None. IMPRESSION: 1. Massive necrotic cervical lymphadenopathy is increased in size. For example, the largest left-sided node measures up to 8.5 cm, previously 5.3 cm. 2. Findings of extranodal extension of lymphadenopathy and probable invasion of the left internal jugular vein now extending to junction with left brachiocephalic vein and craniocervical junction. 3. Mass effect on the airway which is displaced rightward and moderately narrow the level of hypopharynx. 4. Stable left upper lobe 4 mm nodule. Electronically Signed   By: Kristine Garbe M.D.   On: 10/19/2016 17:54    Assessment & Plan:  Tongue cancer [facility]) The patient has complex past medical history including renal failure on hemodialysis (which has since resolved), severe substance abuse especially with alcoholism (has been abstinent)and background history of dementia. I have read a lot of the past medicalrecords, in collaboration with history of fromher daughter who appears to be the principal caregiver. In essence, this patient has rapidly progressive disease in the neck with impeding airway compromise without further treatment. Due to her significant comorbidities, I am not enthusiastic to prescribe aggressive chemotherapy However, without systemic chemotherapy, she would likely die soon due to malignant hypercalcemia There is a concern about possible airway compromise with airway swelling with radiation treatment After extensive discussion at the ENT tumor board, the decision is made for her to undergo chemotherapy in the hospital first in an attempt to shrink down the tumor I will get PICC line placement before chemotherapy if  possible  Malignant hypercalcemia This has improved on IV fluid resuscitation, IV dexamethasone, nasal calcitonin and Lasix  Continue the same  Imminent airway compromise with mild stridor and excessive airway secretion Due to disease progression I will start her on dexamethasone IV twice a day and monitor carefully ENT service had evaluated her imaging and felt that tracheostomy at this point is not possible  Possible aspiration We will get formal swallow evaluation, result pending CXR show no evidence of pneumonia I will keep her on liquid diet only for now  Leukocytosis with necrotic tumor with serosanguineous discharge/cellulitis We will start her on Ancef IV and continue dry dressing changes  Severe hypokalemia Likely due to poor oral intake and severe hypercalcemia We will replace with IV magnesium, and IV potassium and liquid potassium This is slowly improving  Severe cancer pain We will start her on gentle dose of IV morphine  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 After extensive review of prior evaluation by neurologist from [place], I felt that the patient indeed may have age related dementia but appears to be coping well with aggressive social support  CODE STATUS DNR  Goals of care, counseling/discussion The patient is aware shehas incurable  disease and treatment is strictly palliative. We discussed importance of Advanced Directives and Living will. Her daughter appears to be the healthcare medical power of attorney I discussed goals of care with the patient's daughter and she agree with the plan of care as above  Discharge planning The patient had multiple reason to be admitted, namely malignant hypercalcemia, severe hypokalemia, leukocytosis with draining necrotic tumor and imminent airway compromise.  The risk of  not admitting her would be risk of death She will likely to be here the next 3-5 days   Heath Lark, MD 10/21/2016  4:29 PM

## 2016-10-21 NOTE — Progress Notes (Signed)
Spoke with primary RN, Jenny Reichmann regarding PICC insertion.   After reading PET scan and radiology reports describing thrombus in bilateral IJs it was suggested that this PICC not be placed at the bedside by IV Team.   He will notify MD for further instruction.

## 2016-10-21 NOTE — Progress Notes (Signed)
Modified Barium Swallow Progress Note  Patient Details  Name: [name] MRN: [id number] Date of Birth: 1934-05-06  Today's Date: 10/21/2016  Modified Barium Swallow completed.  Full report located under Chart Review in the Imaging Section.  Brief recommendations include the following:  Clinical Impression  Pt tested in lateral and A-P view.  Pt presents with mild oropharyngeal dysphagia without aspiration of any consistency tested (thin, nectar, pudding, cracker and barium tablet).  Decreased oral manipulation due to pt's lingual surgery noted.  Pharyngeal swallow was overall strong without significant residuals/aspiration.  Very trace laryngeal penetration of thin liquid secondary to decreased timing of laryngeal closure cleared independently with swallow.    Of note, pt voice was clear prior to and during MBS - she reports wet voice occuring AFTER meals.  Pt is likely having aspiration of secretions but was protective of her airway with po during MBS.     Using live imaging, educated pt to findings/recommendations.   When Md indicates, recommend advance to dys3/thin due to oral deficits with strict precautions. SlP to follow briefly for pt education/po tolerance.  Thanks for this consult.    Swallow Evaluation Recommendations           Liquid Administration via: Straw   Medication Administration: Whole meds with liquid (or with puree if problematic)   Supervision: Patient able to self feed   Compensations: Slow rate;Small sips/bites;Follow solids with liquid (follow bites of food with multiple sips of liquids)   Postural Changes: Remain semi-upright after after feeds/meals (Comment);Seated upright at 90 degrees   Oral Care Recommendations: Oral care BID        Janett Labella [place], [place] [facility] SLP [phone]

## 2016-10-21 NOTE — Evaluation (Signed)
Clinical/Bedside Swallow Evaluation Patient Details  Name: MAIRIM BADE MRN: [id number] Date of Birth: 30-Sep-1933  Today's Date: 10/21/2016 Time: SLP Start Time (ACUTE ONLY): 7867 SLP Stop Time (ACUTE ONLY): 1242 SLP Time Calculation (min) (ACUTE ONLY): 20 min  Past Medical History:  Past Medical History:  Diagnosis Date  . Abdominal pain   . Blood dyscrasia    HYPOKALEMIA  . Blood transfusion   . COPD (chronic obstructive pulmonary disease) ([place])   . Dementia    pathologic aging and dementia per daughter  . Diabetes mellitus    Type II  . Heart murmur   . History of blood transfusion   . Hypertension   . Hypothyroid   . Neuropathy   . Substance abuse    ETOH with dementia and prior encephalopathy and withdrawal  . Tongue cancer ([place]) 05/13/2016  . Tricuspid regurgitation 2012   mild  . Urinary tract infection    Past Surgical History:  Past Surgical History:  Procedure Laterality Date  . ABDOMINAL HYSTERECTOMY     1970's  . BREAST SURGERY Left    Lumpectomy  . COLONOSCOPY    . ESOPHAGOGASTRODUODENOSCOPY  05/17/2011   Procedure: ESOPHAGOGASTRODUODENOSCOPY (EGD);  Surgeon: Lafayette Dragon, MD;  [facility] ENDOSCOPY;  Service: Endoscopy;  Laterality: N/A;  . EXCISION OF TONGUE LESION Left 05/13/2016   Procedure: WIDE LOCAL EXCISION OF LEFT LATERAL TONGUE CANCER;  Surgeon: Jerrell Belfast, MD;  Location: [facility];  Service: ENT;  Laterality: Left;  . EXCISION OF TONGUE LESION Left 06/12/2016   Procedure: RE-EXCISION OF LEFT  TONGUE CANCER;  Surgeon: Jerrell Belfast, MD;  Location: [facility];  Service: ENT;  Laterality: Left;  . EYE SURGERY Bilateral    Cataract  . TONGUE SURGERY  05/13/2016   WIDE LOCAL EXCISION OF LEFT LATERAL TONGUE CANCER (Left) with Local Soft Tissue Reconstruction   . TONSILLECTOMY AND ADENOIDECTOMY     HPI:  Pt is an 83 yo female adm to [facility] with h/o cancer of anterior 2/3 tongue s/p surgery 05/2016 and now has rapidly growing tumor in her left  neck and jaw region for which she is scheduled to have radiation.   Massive neck/cervical lymphadenopathy 8.5 cm with invasion of IJ with tumor thrombus per MD note.   Pt with left supraglottic larynx/pharynx, right submandibular - node 4 cm.  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  Swallow evaluation ordered.   Pt denies ever having xray swallow study done before.    Assessment / Plan / Recommendation Clinical Impression  Pt presents with concerns for severe oropharyngeal dysphagia - and possible CN involvement of hypoglossal, glossopharyngeal, facial and trigeminal nerves.  Pt presents with wet gurgly voice at baseline - that is difficult for her to clear - causing suspicion for aspiration of secretions.  Pt reports loss of liquids through right neck due to fistula.  She has a method to swallowing including placing food posterior oral cavity, using straw for posterior left oral placement to compensate for lack of anterior tongue and to decrease loss of liquids into neck from right fistula.  Pt can clear wet voice with cued cough/throat clear but his recurrs quickly.  Educated pt to concerns for aspiration/decreased nutritional ability with current level of dysphagia.  Will proceed with MBS to allow instrumental swallow testing if MD approves  - pt agreeable.  Recommend continue clears with strict precautions.  SLP Visit Diagnosis:  Dysphagia, oropharyngeal phase (R13.12)    Aspiration Risk  Severe aspiration risk;Risk for inadequate nutrition/hydration    Diet Recommendation Thin liquid   Liquid Administration via: Straw Medication Administration: Crushed with puree Supervision: Patient able to self feed Compensations: Slow rate;Small sips/bites    Other  Recommendations Oral Care Recommendations: Oral care BID   Follow up Recommendations  (tbd)      Frequency and Duration min 1 x/week  1 week       Prognosis  Prognosis for Safe Diet Advancement: Fair Barriers to Reach Goals: Severity of deficits;Time post onset      Swallow Study   General Date of Onset: 10/21/16 HPI: Pt is an 83 yo female adm to [facility] with h/o cancer of anterior 2/3 tongue s/p surgery 05/2016 and now has rapidly growing tumor in her left neck and jaw region for which she is scheduled to have radiation.   Massive neck/cervical lymphadenopathy 8.5 cm with invasion of IJ with tumor thrombus per MD note.   Pt with left supraglottic larynx/pharynx, right submandibular - node 4 cm.  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  Swallow evaluation ordered.   Pt denies ever having xray swallow study done before.  Type of Study: Bedside Swallow Evaluation Diet Prior to this Study: Thin liquids Temperature Spikes Noted: Yes (99.3) Respiratory Status: Room air History of Recent Intubation: No Behavior/Cognition: Alert;Cooperative;Pleasant mood Oral Cavity Assessment:  (portion of anterior tongue removed) Oral Care Completed by SLP: No Oral Cavity - Dentition: Poor condition;Missing dentition Vision: Functional for self-feeding Self-Feeding Abilities: Able to feed self Patient Positioning: Upright in bed Baseline Vocal Quality: Wet;Suspected CN X (Vagus) involvement;Low vocal intensity    Oral/Motor/Sensory Function Overall Oral Motor/Sensory Function: Moderate impairment   Ice Chips Ice chips: Not tested   Thin Liquid Thin Liquid: Impaired Presentation: Straw Oral Phase Impairments: Reduced labial seal;Reduced lingual movement/coordination Oral Phase Functional Implications: Prolonged oral transit Pharyngeal  Phase Impairments: Decreased hyoid-laryngeal movement;Multiple swallows;Wet Vocal Quality;Cough - Immediate;Cough - Delayed    Nectar Thick Nectar Thick Liquid: Not tested   Honey Thick Honey Thick Liquid: Not tested   Puree Puree: Not tested    Solid   GO   Solid: Not tested        Claudie Fisherman, [place] [facility] SLP [phone]

## 2016-10-22 ENCOUNTER — Other Ambulatory Visit: Payer: Self-pay | Admitting: Student

## 2016-10-22 ENCOUNTER — Encounter (HOSPITAL_COMMUNITY): Payer: Self-pay | Admitting: Radiology

## 2016-10-22 ENCOUNTER — Inpatient Hospital Stay (HOSPITAL_COMMUNITY): Payer: Medicare Other

## 2016-10-22 ENCOUNTER — Telehealth (HOSPITAL_COMMUNITY): Payer: Self-pay | Admitting: *Deleted

## 2016-10-22 ENCOUNTER — Other Ambulatory Visit: Payer: Self-pay | Admitting: Hematology and Oncology

## 2016-10-22 DIAGNOSIS — R739 Hyperglycemia, unspecified: Secondary | ICD-10-CM

## 2016-10-22 HISTORY — PX: IR US GUIDE VASC ACCESS RIGHT: IMG2390

## 2016-10-22 HISTORY — PX: IR FLUORO GUIDE CV LINE RIGHT: IMG2283

## 2016-10-22 LAB — COMPREHENSIVE METABOLIC PANEL
ALBUMIN: 3.1 g/dL — AB (ref 3.5–5.0)
ALT: 9 U/L — ABNORMAL LOW (ref 14–54)
AST: 14 U/L — AB (ref 15–41)
Alkaline Phosphatase: 93 U/L (ref 38–126)
Anion gap: 7 (ref 5–15)
BUN: 15 mg/dL (ref 6–20)
CALCIUM: 10.9 mg/dL — AB (ref 8.9–10.3)
CO2: 26 mmol/L (ref 22–32)
CREATININE: 0.63 mg/dL (ref 0.44–1.00)
Chloride: 107 mmol/L (ref 101–111)
GFR calc Af Amer: >60 mL/min (ref >60)
GLUCOSE: 203 mg/dL — AB (ref 65–99)
POTASSIUM: 4.8 mmol/L (ref 3.5–5.1)
Sodium: 140 mmol/L (ref 135–145)
TOTAL PROTEIN: 6.7 g/dL (ref 6.5–8.1)
Total Bilirubin: 0.2 mg/dL — ABNORMAL LOW (ref 0.3–1.2)

## 2016-10-22 LAB — CBC WITH DIFFERENTIAL/PLATELET
BASOS PCT: 0 %
Basophils Absolute: 0 10*3/uL (ref 0.0–0.1)
EOS ABS: 0 10*3/uL (ref 0.0–0.7)
EOS PCT: 0 %
HCT: 31.5 % — ABNORMAL LOW (ref 36.0–46.0)
Hemoglobin: 10.4 g/dL — ABNORMAL LOW (ref 12.0–15.0)
Lymphocytes Relative: 4 %
Lymphs Abs: 2.2 10*3/uL (ref 0.7–4.0)
MCH: 27.4 pg (ref 26.0–34.0)
MCHC: 33 g/dL (ref 30.0–36.0)
MCV: 82.9 fL (ref 78.0–100.0)
MONO ABS: 1.7 10*3/uL — AB (ref 0.1–1.0)
Monocytes Relative: 3 %
NEUTROS ABS: 51.7 10*3/uL — AB (ref 1.7–7.7)
NEUTROS PCT: 93 %
PLATELETS: 349 10*3/uL (ref 150–400)
RBC: 3.8 MIL/uL — ABNORMAL LOW (ref 3.87–5.11)
RDW: 16.7 % — ABNORMAL HIGH (ref 11.5–15.5)
WBC: 55.6 10*3/uL (ref 4.0–10.5)

## 2016-10-22 LAB — GLUCOSE, CAPILLARY
GLUCOSE-CAPILLARY: 281 mg/dL — AB (ref 65–99)
Glucose-Capillary: 130 mg/dL — ABNORMAL HIGH (ref 65–99)
Glucose-Capillary: 158 mg/dL — ABNORMAL HIGH (ref 65–99)

## 2016-10-22 LAB — URIC ACID: URIC ACID, SERUM: 6.6 mg/dL (ref 2.3–6.6)

## 2016-10-22 LAB — LACTATE DEHYDROGENASE: LDH: 141 U/L (ref 98–192)

## 2016-10-22 MED ORDER — POTASSIUM CHLORIDE 2 MEQ/ML IV SOLN
Freq: Once | INTRAVENOUS | Status: AC
  Administered 2016-10-22: 15:00:00 via INTRAVENOUS
  Filled 2016-10-22: qty 10

## 2016-10-22 MED ORDER — SODIUM CHLORIDE 0.9% FLUSH: 3.0000 mL | INTRAVENOUS | Status: DC | PRN

## 2016-10-22 MED ORDER — CISPLATIN CHEMO INJECTION 100MG/100ML
30.0000 mg/m2 | Freq: Once | INTRAVENOUS | Status: AC
  Administered 2016-10-22: 43 mg via INTRAVENOUS
  Filled 2016-10-22: qty 43

## 2016-10-22 MED ORDER — FOSAPREPITANT DIMEGLUMINE INJECTION 150 MG
Freq: Once | INTRAVENOUS | Status: AC
  Administered 2016-10-22: 16:00:00 via INTRAVENOUS
  Filled 2016-10-22: qty 5

## 2016-10-22 MED ORDER — HEPARIN SOD (PORK) LOCK FLUSH 100 UNIT/ML IV SOLN
250.0000 [IU] | Freq: Once | INTRAVENOUS | Status: AC | PRN
  Administered 2016-10-30: 250 [IU]
  Filled 2016-10-22: qty 5

## 2016-10-22 MED ORDER — PALONOSETRON HCL INJECTION 0.25 MG/5ML
0.2500 mg | Freq: Once | INTRAVENOUS | Status: AC
  Administered 2016-10-22: 0.25 mg via INTRAVENOUS
  Filled 2016-10-22 (×2): qty 5

## 2016-10-22 MED ORDER — HEPARIN SOD (PORK) LOCK FLUSH 100 UNIT/ML IV SOLN: 500.0000 [IU] | Freq: Once | INTRAVENOUS | Status: DC | PRN

## 2016-10-22 MED ORDER — DEXAMETHASONE SODIUM PHOSPHATE 4 MG/ML IJ SOLN
2.0000 mg | Freq: Two times a day (BID) | INTRAMUSCULAR | Status: DC
  Administered 2016-10-23: 2 mg via INTRAVENOUS
  Filled 2016-10-22: qty 1

## 2016-10-22 MED ORDER — SODIUM CHLORIDE 0.9% FLUSH: 10.0000 mL | INTRAVENOUS | Status: DC | PRN

## 2016-10-22 MED ORDER — MORPHINE SULFATE (CONCENTRATE) 10 MG/0.5ML PO SOLN
10.0000 mg | Freq: Three times a day (TID) | ORAL | Status: DC
  Administered 2016-10-22 – 2016-10-28 (×19): 10 mg via ORAL
  Filled 2016-10-22 (×20): qty 0.5

## 2016-10-22 MED ORDER — LIDOCAINE HCL 1 % IJ SOLN
INTRAMUSCULAR | Status: DC | PRN
  Administered 2016-10-22: 5 mL

## 2016-10-22 MED ORDER — INSULIN GLARGINE 100 UNIT/ML ~~LOC~~ SOLN
10.0000 [IU] | Freq: Every day | SUBCUTANEOUS | Status: DC
  Administered 2016-10-22 – 2016-10-24 (×3): 10 [IU] via SUBCUTANEOUS
  Filled 2016-10-22 (×4): qty 0.1

## 2016-10-22 MED ORDER — ALTEPLASE 2 MG IJ SOLR
2.0000 mg | Freq: Once | INTRAMUSCULAR | Status: DC | PRN
  Filled 2016-10-22 (×2): qty 2

## 2016-10-22 MED ORDER — SODIUM CHLORIDE 0.9 % IV SOLN
Freq: Once | INTRAVENOUS | Status: AC
  Administered 2016-10-22: 17:00:00 via INTRAVENOUS

## 2016-10-22 MED ORDER — LIDOCAINE HCL 1 % IJ SOLN
INTRAMUSCULAR | Status: AC
  Filled 2016-10-22: qty 20

## 2016-10-22 MED ORDER — FUROSEMIDE 10 MG/ML IJ SOLN
20.0000 mg | Freq: Once | INTRAMUSCULAR | Status: AC
  Administered 2016-10-22: 20 mg via INTRAVENOUS
  Filled 2016-10-22: qty 2

## 2016-10-22 NOTE — Progress Notes (Signed)
[name]   DOB:April 13, 1934   XA#:[id number]    Subjective: She is feeling better.  She is irritated because of frequent blood pressure monitoring overnight.  She continues to have significant throat pain.  She continues to have drainage through her wound  Objective:  Vitals:   10/21/16 2028 10/22/16 0412  BP: (!) 110/93 (!) 124/91  Pulse: 68 86  Resp: 18 16  Temp: 98.9 F (37.2 C) 98.7 F (37.1 C)     Intake/Output Summary (Last 24 hours) at 10/22/16 2094 Last data filed at 10/21/16 2321  Gross per 24 hour  Intake          2121.25 ml  Output              100 ml  Net          2021.25 ml    GENERAL:alert, no distress and comfortable SKIN: She has bandage over her right neck EYES: normal, Conjunctiva are pink and non-injected, sclera clear OROPHARYNX:no exudate, no erythema and lips, buccal mucosa, and tongue normal  NECK: supple, thyroid normal size, non-tender, without nodularity LYMPH: Large bilateral neck lymphadenopathy uinal LUNGS: clear to auscultation and percussion with normal breathing effort HEART: regular rate & rhythm and no murmurs and no lower extremity edema ABDOMEN:abdomen soft, non-tender and normal bowel sounds Musculoskeletal:no cyanosis of digits and no clubbing  NEURO: alert & oriented x 3 with mild slurring of speech due to tongue surgery, no focal motor/sensory deficits   Labs:  Lab Results  Component Value Date   WBC 40.9 (H) 10/21/2016   HGB 11.7 (L) 10/21/2016   HCT 36.0 10/21/2016   MCV 82.6 10/21/2016   PLT 328 10/21/2016   NEUTROABS 31.7 (H) 10/20/2016    Lab Results  Component Value Date   NA 140 10/22/2016   K 4.8 10/22/2016   CL 107 10/22/2016   CO2 26 10/22/2016    Studies:  X-ray Chest Pa And Lateral  Result Date: 10/20/2016 CLINICAL DATA:  83 year old female with dry cough. EXAM: CHEST  2 VIEW COMPARISON:  January 31, 2013 FINDINGS: The heart size is mildly enlarged. The hila and mediastinum are normal. No pulmonary  nodules, masses, or focal infiltrates. No acute abnormalities. IMPRESSION: No active cardiopulmonary disease. Electronically Signed   By: Dorise Bullion III M.D   On: 10/20/2016 20:02   Dg Swallowing Func-speech Pathology  Result Date: 10/21/2016 Objective Swallowing Evaluation: Type of Study: MBS-Modified Barium Swallow Study Patient Details Name: [name] MRN: [id number] Date of Birth: [birth year] Today's Date: 10/21/2016 Time: SLP Start Time (ACUTE ONLY): 1457-SLP Stop Time (ACUTE ONLY): 2947 SLP Time Calculation (min) (ACUTE ONLY): 20 min Past Medical History: Past Medical History: Diagnosis Date . Abdominal pain  . Blood dyscrasia   HYPOKALEMIA . Blood transfusion  . COPD (chronic obstructive pulmonary disease) ([place])  . Dementia   pathologic aging and dementia per daughter . Diabetes mellitus   Type II . Heart murmur  . History of blood transfusion  . Hypertension  . Hypothyroid  . Neuropathy  . Substance abuse   ETOH with dementia and prior encephalopathy and withdrawal . Tongue cancer ([place]) 05/13/2016 . Tricuspid regurgitation 2012  mild . Urinary tract infection  Past Surgical History: Past Surgical History: Procedure Laterality Date . ABDOMINAL HYSTERECTOMY    1970's . BREAST SURGERY Left   Lumpectomy . COLONOSCOPY   . ESOPHAGOGASTRODUODENOSCOPY  05/17/2011  Procedure: ESOPHAGOGASTRODUODENOSCOPY (EGD);  Surgeon: Lafayette Dragon, MD;  Location: [facility] ENDOSCOPY;  Service: Endoscopy;  Laterality: N/A; . EXCISION OF TONGUE LESION Left 05/13/2016  Procedure: WIDE LOCAL EXCISION OF LEFT LATERAL TONGUE CANCER;  Surgeon: Jerrell Belfast, MD;  Location: [place];  Service: ENT;  Laterality: Left; . EXCISION OF TONGUE LESION Left 06/12/2016  Procedure: RE-EXCISION OF LEFT  TONGUE CANCER;  Surgeon: Jerrell Belfast, MD;  Location: [place];  Service: ENT;  Laterality: Left; . EYE SURGERY Bilateral   Cataract . TONGUE SURGERY  05/13/2016  WIDE LOCAL EXCISION OF LEFT LATERAL TONGUE CANCER (Left) with Local Soft Tissue  Reconstruction  . TONSILLECTOMY AND ADENOIDECTOMY   HPI: Pt is an 83 yo female adm to [facility] with h/o cancer of anterior 2/3 tongue s/p surgery 05/2016 and now has rapidly growing tumor in her left neck and jaw region for which she is scheduled to have radiation.   Massive neck/cervical lymphadenopathy 8.5 cm with invasion of IJ with tumor thrombus per MD note.   Pt with left supraglottic larynx/pharynx, right submandibular - node 4 cm.  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  Swallow evaluation ordered.   Pt denies ever having xray swallow study done before.  Subjective: pt awake in chair Assessment / Plan / Recommendation CHL IP CLINICAL IMPRESSIONS 10/21/2016 Clinical Impression Pt tested in lateral and A-P view.  Pt presents with mild oropharyngeal dysphagia without aspiration of any consistency tested (thin, nectar, pudding, cracker and barium tablet). Decreased oral manipulation due to pt's lingual surgery noted.  Pharyngeal swallow was overall strong without significant residuals/aspiration.  Very trace laryngeal penetration of thin liquid secondary to decreased timing of laryngeal closure cleared indpendently.  Of note, pt voice was clear prior to and during MBS - she reports wet voice occuring AFTER meals.  Pt is likely having aspiration of secretions but was protective of her airway with MBS.   Using live imaging, educated pt to findings/recommendations.   When Md indicates, recommend advance to dys3/thin due to oral deficits with strict precautions. SlP to follow briefly for pt education/po toelrance.  Thanks for this consult.  SLP Visit Diagnosis Dysphagia, oropharyngeal phase (R13.12) Attention and concentration deficit following -- Frontal lobe and executive function deficit following -- Impact on safety and function Mild aspiration risk   CHL IP TREATMENT RECOMMENDATION 10/21/2016 Treatment Recommendations Therapy as outlined in  treatment plan below   Prognosis 10/21/2016 Prognosis for Safe Diet Advancement Fair Barriers to Reach Goals Severity of deficits;Time post onset Barriers/Prognosis Comment -- CHL IP DIET RECOMMENDATION 10/21/2016 SLP Diet Recommendations -- Liquid Administration via Straw Medication Administration Whole meds with liquid Compensations Slow rate;Small sips/bites;Follow solids with liquid Postural Changes Remain semi-upright after after feeds/meals (Comment);Seated upright at 90 degrees   CHL IP OTHER RECOMMENDATIONS 10/21/2016 Recommended Consults -- Oral Care Recommendations Oral care BID Other Recommendations --   CHL IP FOLLOW UP RECOMMENDATIONS 10/21/2016 Follow up Recommendations (No Data)   CHL IP FREQUENCY AND DURATION 10/21/2016 Speech Therapy Frequency (ACUTE ONLY) min 1 x/week Treatment Duration 1 week      CHL IP ORAL PHASE 10/21/2016 Oral Phase Impaired Oral - Pudding Teaspoon -- Oral - Pudding Cup -- Oral - Honey Teaspoon -- Oral - Honey Cup -- Oral - Nectar Teaspoon -- Oral - Nectar Cup Weak lingual manipulation Oral - Nectar Straw Weak lingual manipulation Oral - Thin Teaspoon Weak lingual manipulation Oral - Thin Cup Weak lingual manipulation Oral - Thin Straw Weak lingual manipulation Oral - Puree Weak lingual manipulation;Reduced  posterior propulsion Oral - Mech Soft Weak lingual manipulation;Impaired mastication;Reduced posterior propulsion Oral - Regular -- Oral - Multi-Consistency -- Oral - Pill Weak lingual manipulation;Reduced posterior propulsion Oral Phase - Comment --  CHL IP PHARYNGEAL PHASE 10/21/2016 Pharyngeal Phase Impaired Pharyngeal- Pudding Teaspoon -- Pharyngeal -- Pharyngeal- Pudding Cup -- Pharyngeal -- Pharyngeal- Honey Teaspoon -- Pharyngeal -- Pharyngeal- Honey Cup -- Pharyngeal -- Pharyngeal- Nectar Teaspoon -- Pharyngeal -- Pharyngeal- Nectar Cup WFL Pharyngeal -- Pharyngeal- Nectar Straw WFL Pharyngeal -- Pharyngeal- Thin Teaspoon WFL Pharyngeal -- Pharyngeal- Thin Cup WFL Pharyngeal --  Pharyngeal- Thin Straw WFL Pharyngeal -- Pharyngeal- Puree WFL Pharyngeal -- Pharyngeal- Mechanical Soft WFL Pharyngeal -- Pharyngeal- Regular -- Pharyngeal -- Pharyngeal- Multi-consistency -- Pharyngeal -- Pharyngeal- Pill WFL Pharyngeal -- Pharyngeal Comment --  CHL IP CERVICAL ESOPHAGEAL PHASE 10/21/2016 Cervical Esophageal Phase Impaired Pudding Teaspoon -- Pudding Cup -- Honey Teaspoon -- Honey Cup -- Nectar Teaspoon -- Nectar Cup -- Nectar Straw -- Thin Teaspoon -- Thin Cup -- Thin Straw -- Puree -- Mechanical Soft -- Regular -- Multi-consistency -- Pill -- Cervical Esophageal Comment barium tablet appeared to lodge at mid-esophagus without awareness, further bolus of pudding did not faciliate clearance however multiple boluses of liquids effective;  radiologist is not present and MBS is not designed to examine below UES - if MD deems indicated, pt may benefit from dedicated esophageal evaluation No flowsheet data found. Luanna Salk, [facility] SLP [phone]               Assessment & Plan:   Tongue cancer [facility]) The patient has complex past medical history including renal failure on hemodialysis (which has since resolved), severe substance abuse especially with alcoholism (has been abstinent)and background history of dementia. I have read a lot of the past medicalrecords, in collaboration with history of fromher daughter who appears to be the principal caregiver. In essence, this patient has rapidly progressive disease in the neck with impeding airway compromise without further treatment. Due to her significant comorbidities, I was not enthusiastic to prescribe aggressive chemotherapy However, without systemic chemotherapy, she would likely die soon due to malignant hypercalcemia There is a concern about possible airway compromise with airway swelling with radiation treatment After extensive discussion at the ENT tumor board, the decision is made for her to undergo chemotherapy in the hospital first  in an attempt to shrink down the tumor I will get PICC line placement before chemotherapy if possible We discussed the role of chemotherapy. The intent is of palliative intent.  We discussed some of the risks, benefits, side-effects of cisplatin with her daughter  Some of the short term side-effects included, though not limited to, including weight loss, life threatening infections, risk of allergic reactions, need for transfusions of blood products, nausea, vomiting, change in bowel habits, loss of hair, admission to hospital for various reasons, and risks of death.   Long term side-effects are also discussed including risks of infertility, permanent damage to nerve function, hearing loss, chronic fatigue, kidney damage with possibility needing hemodialysis, and rare secondary malignancy including bone marrow disorders. Her daughter agreed to proceed with treatment. I would give her dose reduced chemotherapy due to her age and previous co-morbidities We will get her some chemo education today  Malignant hypercalcemia This has improved on IV fluid resuscitation, IV dexamethasone, nasal calcitonin and Lasix  Continue the same  Imminent airway compromise with mild stridor and excessive airway secretion Due to disease progression I will start her on dexamethasone IV twice a day  and monitor carefully ENT service had evaluated her imaging and felt that tracheostomy at this point is not possible  Possible aspiration We will get formal swallow evaluation, result available, risk of aspiration CXR show no evidence of pneumonia We will advance her diet  Leukocytosis with necrotic tumor with serosanguineous discharge/cellulitis We will start her on Ancef IV and continue dry dressing changes  Severe hypokalemia Likely due to poor oral intake and severe hypercalcemia We will replace with IV magnesium, and IV potassium and liquid potassium This is slowly improving  Steroid induced  hyperglycemia We will titrate up insulin  Severe cancer pain I will get her schedule liquid morphine and will reassess tomorrow Continue IV as needed morphine  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 After extensive review of prior evaluation by neurologist from [place], I felt that the patient indeed may have age related dementia but appears to be coping well with aggressive social support  CODE STATUS DNR  Goals of care, counseling/discussion The patient is aware shehas incurable disease and treatment is strictly palliative. We discussed importance of Advanced Directives and Living will. Her daughter appears to be the healthcare medical power of attorney I discussed goals of care with the patient's daughter and she agree with the plan of care as above  Discharge planning The patient had multiple reason to be admitted, namely malignant hypercalcemia, severe hypokalemia, leukocytosis with draining necrotic tumor and imminent airway compromise. The risk of not admitting her would be risk of death She will likely to be here the next 3-5 days   Heath Lark, MD 10/22/2016  9:14 AM

## 2016-10-22 NOTE — Progress Notes (Signed)
Chemo teaching provided to patient and daughter (caregiver) Seth Bake.  Patient education handouts provided to daughter. Pharmacists and bedside RN answered daughter's questions to satisfaction. Will continue to monitor pt and provide educational reinforcement as needed.

## 2016-10-22 NOTE — Progress Notes (Cosign Needed)
Chemo dosages and dilutions verified with 2nd chemo RN.

## 2016-10-22 NOTE — Progress Notes (Signed)
MEDICATION-RELATED CONSULT NOTE   IR Procedure Consult - Anticoagulant/Antiplatelet PTA/Inpatient Med List Review by Pharmacist    Procedure: PICC line placement    Completed: 5/3   Post-Procedural bleeding risk per IR MD assessment:    Antithrombotic medications on inpatient or PTA profile prior to procedure:     Lovenox 40mg  SQ q24hr, last dose 5/2 at 2200  Recommended restart time per IR Post-Procedure Guidelines:   Con  Other considerations:      Plan:    Continue Lovenox 40mg  SQ q24hr, next dose due today 5/3 at [place], [place] PharmD Pager [phone] 10/22/2016, 11:00 AM

## 2016-10-22 NOTE — Progress Notes (Signed)
START ON PATHWAY REGIMEN - Head and Neck     Administer weekly:     Cisplatin   **Always confirm dose/schedule in your pharmacy ordering system**    Patient Characteristics: Oropharynx, HPV Positive, Clinically Staged, T1-4, cN1-3 or T3-4, cN0 Disease Classification: Oropharynx HPV Status: Positive (+) AJCC N Category: cN3 AJCC 8 Stage Grouping: III Current Disease Status: No Distant Metastases and No Recurrent Disease AJCC T Category: T4 AJCC M Category: M0  Intent of Therapy: Non-Curative / Palliative Intent, Discussed with Patient

## 2016-10-22 NOTE — Significant Event (Signed)
Rapid Response Event Note  Overview:  RRT RN called by Bedside RN in regards to patient desaturating into low to mid 80s % per Spo2. Patient was assisted to the bathroom when this event occurred.     Initial Focused Assessment: Patient was alert and oriented x 4. Patient was placed on NRB already upon entering the room. Patient was able to follow commands with ease. Patient's SpO2 fluctuating between 89%-94%. Patient's lung sounds were crackle in nature upon assessment. Patient said had "some SOB but not a lot". Bedside contacted MD in regards to event as well.    Interventions: MD ordered to give 20mg  of Lasix and decrease fluids to [facility]. And continue to monitor.  Plan of Care (if not transferred):  Advised RN only use bedpan/bedside commode due to patient desaturating upon exertion. Continue to monitor at this time. Call RRT if needed or for other concerns.        Liana Camerer C

## 2016-10-22 NOTE — Procedures (Signed)
US/fluoroscopic guided placement of right DL basilic vein PICC. Length 36 cm. Tip SVC/RA junction. Medication used- 1% lidocaine to skin/SQ tissue. No immediate complications. Ok to use.

## 2016-10-22 NOTE — Progress Notes (Signed)
Patient found to be restless and denies any pain or discomfort. Patient's lips blue/gray. O2 sats were 67%. Nasal cannula 4L applied. O2 sats 77%. Nonrebreather applied. O2 sats then 88%. Vital signs now: BP 140/81 left arm sitting, HR 47, O2 100% on 2L nasal cannula. Dr. Alvy Bimler made aware of above. Orders for continuous O2 sat and tele-sitter. Orders to be implemented. Patient is now resting quietly in bed with no distress noted. Will continue to monitor.

## 2016-10-23 ENCOUNTER — Inpatient Hospital Stay (HOSPITAL_COMMUNITY): Payer: Medicare Other

## 2016-10-23 DIAGNOSIS — I959 Hypotension, unspecified: Secondary | ICD-10-CM

## 2016-10-23 DIAGNOSIS — C029 Malignant neoplasm of tongue, unspecified: Principal | ICD-10-CM

## 2016-10-23 DIAGNOSIS — J189 Pneumonia, unspecified organism: Secondary | ICD-10-CM

## 2016-10-23 DIAGNOSIS — D72828 Other elevated white blood cell count: Secondary | ICD-10-CM

## 2016-10-23 DIAGNOSIS — R061 Stridor: Secondary | ICD-10-CM

## 2016-10-23 LAB — URIC ACID: URIC ACID, SERUM: 7.5 mg/dL — AB (ref 2.3–6.6)

## 2016-10-23 LAB — CBC WITH DIFFERENTIAL/PLATELET
BASOS ABS: 0 10*3/uL (ref 0.0–0.1)
Basophils Relative: 0 %
EOS PCT: 0 %
Eosinophils Absolute: 0 10*3/uL (ref 0.0–0.7)
HCT: 33.9 % — ABNORMAL LOW (ref 36.0–46.0)
Hemoglobin: 10.9 g/dL — ABNORMAL LOW (ref 12.0–15.0)
LYMPHS ABS: 1.9 10*3/uL (ref 0.7–4.0)
Lymphocytes Relative: 3 %
MCH: 27.1 pg (ref 26.0–34.0)
MCHC: 32.2 g/dL (ref 30.0–36.0)
MCV: 84.3 fL (ref 78.0–100.0)
Monocytes Absolute: 0.6 10*3/uL (ref 0.1–1.0)
Monocytes Relative: 1 %
Neutro Abs: 60.6 10*3/uL — ABNORMAL HIGH (ref 1.7–7.7)
Neutrophils Relative %: 96 %
PLATELETS: 327 10*3/uL (ref 150–400)
RBC: 4.02 MIL/uL (ref 3.87–5.11)
RDW: 16.9 % — AB (ref 11.5–15.5)
WBC: 63.1 10*3/uL — AB (ref 4.0–10.5)

## 2016-10-23 LAB — COMPREHENSIVE METABOLIC PANEL
ALT: 8 U/L — AB (ref 14–54)
ALT: 8 U/L — AB (ref 14–54)
AST: 10 U/L — ABNORMAL LOW (ref 15–41)
AST: 13 U/L — ABNORMAL LOW (ref 15–41)
Albumin: 2.9 g/dL — ABNORMAL LOW (ref 3.5–5.0)
Albumin: 3 g/dL — ABNORMAL LOW (ref 3.5–5.0)
Alkaline Phosphatase: 96 U/L (ref 38–126)
Alkaline Phosphatase: 102 U/L (ref 38–126)
Anion gap: 5 (ref 5–15)
Anion gap: 7 (ref 5–15)
BUN: 19 mg/dL (ref 6–20)
BUN: 20 mg/dL (ref 6–20)
CALCIUM: 10.6 mg/dL — AB (ref 8.9–10.3)
CALCIUM: 10.8 mg/dL — AB (ref 8.9–10.3)
CHLORIDE: 105 mmol/L (ref 101–111)
CHLORIDE: 101 mmol/L (ref 101–111)
CO2: 27 mmol/L (ref 22–32)
CO2: 29 mmol/L (ref 22–32)
CREATININE: 0.68 mg/dL (ref 0.44–1.00)
CREATININE: 0.77 mg/dL (ref 0.44–1.00)
Glucose, Bld: 257 mg/dL — ABNORMAL HIGH (ref 65–99)
Glucose, Bld: 215 mg/dL — ABNORMAL HIGH (ref 65–99)
Potassium: 5.3 mmol/L — ABNORMAL HIGH (ref 3.5–5.1)
Potassium: 4.5 mmol/L (ref 3.5–5.1)
Sodium: 137 mmol/L (ref 135–145)
Sodium: 137 mmol/L (ref 135–145)
TOTAL PROTEIN: 6.7 g/dL (ref 6.5–8.1)
Total Bilirubin: 0.2 mg/dL — ABNORMAL LOW (ref 0.3–1.2)
Total Bilirubin: 0.2 mg/dL — ABNORMAL LOW (ref 0.3–1.2)
Total Protein: 6.5 g/dL (ref 6.5–8.1)

## 2016-10-23 LAB — GLUCOSE, CAPILLARY
Glucose-Capillary: 217 mg/dL — ABNORMAL HIGH (ref 65–99)
Glucose-Capillary: 222 mg/dL — ABNORMAL HIGH (ref 65–99)

## 2016-10-23 LAB — PHOSPHORUS: Phosphorus: 3 mg/dL (ref 2.5–4.6)

## 2016-10-23 LAB — LACTATE DEHYDROGENASE: LDH: 169 U/L (ref 98–192)

## 2016-10-23 LAB — BRAIN NATRIURETIC PEPTIDE: B Natriuretic Peptide: 972.7 pg/mL — ABNORMAL HIGH (ref 0.0–100.0)

## 2016-10-23 MED ORDER — IPRATROPIUM-ALBUTEROL 0.5-2.5 (3) MG/3ML IN SOLN
3.0000 mL | Freq: Three times a day (TID) | RESPIRATORY_TRACT | Status: DC
  Administered 2016-10-23 – 2016-10-28 (×13): 3 mL via RESPIRATORY_TRACT
  Filled 2016-10-23 (×15): qty 3

## 2016-10-23 MED ORDER — IPRATROPIUM-ALBUTEROL 0.5-2.5 (3) MG/3ML IN SOLN
3.0000 mL | Freq: Four times a day (QID) | RESPIRATORY_TRACT | Status: DC
  Administered 2016-10-23 (×2): 3 mL via RESPIRATORY_TRACT
  Filled 2016-10-23 (×2): qty 3

## 2016-10-23 MED ORDER — SODIUM CHLORIDE 0.9 % IV SOLN
3.0000 mg | Freq: Once | INTRAVENOUS | Status: AC
  Administered 2016-10-23: 3 mg via INTRAVENOUS
  Filled 2016-10-23: qty 2

## 2016-10-23 MED ORDER — FUROSEMIDE 10 MG/ML IJ SOLN
40.0000 mg | Freq: Once | INTRAMUSCULAR | Status: AC
  Administered 2016-10-23: 40 mg via INTRAVENOUS
  Filled 2016-10-23: qty 4

## 2016-10-23 MED ORDER — PIPERACILLIN-TAZOBACTAM 3.375 G IVPB
3.3750 g | Freq: Three times a day (TID) | INTRAVENOUS | Status: DC
  Administered 2016-10-23 – 2016-10-30 (×21): 3.375 g via INTRAVENOUS
  Filled 2016-10-23 (×23): qty 50

## 2016-10-23 MED ORDER — DEXAMETHASONE SODIUM PHOSPHATE 4 MG/ML IJ SOLN
4.0000 mg | Freq: Two times a day (BID) | INTRAMUSCULAR | Status: DC
  Administered 2016-10-23 – 2016-10-25 (×6): 4 mg via INTRAVENOUS
  Filled 2016-10-23 (×6): qty 1

## 2016-10-23 MED ORDER — FUROSEMIDE 10 MG/ML IJ SOLN
20.0000 mg | Freq: Once | INTRAMUSCULAR | Status: AC
Start: 2016-10-23 — End: 2016-10-23
  Administered 2016-10-23: 20 mg via INTRAVENOUS
  Filled 2016-10-23: qty 2

## 2016-10-23 MED ORDER — GLYCOPYRROLATE 1 MG PO TABS
1.0000 mg | ORAL_TABLET | Freq: Two times a day (BID) | ORAL | Status: DC
  Administered 2016-10-23 (×2): 1 mg via ORAL
  Filled 2016-10-23 (×3): qty 1

## 2016-10-23 NOTE — Progress Notes (Addendum)
Inpatient Diabetes Program Recommendations  AACE/ADA: New Consensus Statement on Inpatient Glycemic Control (2015)  Target Ranges:  Prepandial:   less than 140 mg/dL      Peak postprandial:   less than 180 mg/dL (1-2 hours)      Critically ill patients:  140 - 180 mg/dL   Results for CORTNIE, RINGEL (MRN [id number]) as of 10/23/2016 14:38  Ref. Range 10/23/2016 07:27 10/23/2016 12:56  Glucose-Capillary Latest Ref Range: 65 - 99 mg/dL 217 (H) 222 (H)    Home DM Meds: Metformin 500 mg QPM       Amaryl 1 mg daily  Current Insulin Orders: Lantus 10 units daily      Novolog 0-24 units TID      MD- Note patient is receiving Decadron 4 mg BID.  Glucose levels are elevated >200 mg/dl.  If within goals of care for this patient, please consider the following in-hospital insulin adjustments while home oral DM meds are on hold:  Increase Lantus to 14 units daily (~0.3 units/kg dosing while on steroids)      --Will follow patient during hospitalization--  Wyn Quaker RN, MSN, CDE Diabetes Coordinator Inpatient Glycemic Control Team Team Pager: [phone] (8a-5p)

## 2016-10-23 NOTE — Progress Notes (Signed)
Pharmacy Antibiotic Follow-up Note  [name] is a 83 y.o. year-old female admitted on 10/20/2016.  The patient is currently on day 1 of Zosyn for aspiration PNA. Admit 5/1 for HyperCa, increasing lymphadenopathy, airway compromise.  Ancef from 5/1 for draining neck wound  5/4 change Abx to Zosyn for aspiration  Assessment/Plan: Zosyn 3.375g IV q8h (4 hour infusion).  Temp (24hrs), Avg:98.5 F (36.9 C), Min:98 F (36.7 C), Max:98.9 F (37.2 C)   Recent Labs Lab 10/19/16 1306 10/20/16 1627 10/21/16 0452 10/22/16 0830 10/23/16 0540  WBC 33.2* 36.1* 40.9* 55.6* 63.1*    Recent Labs Lab 10/19/16 1306 10/20/16 1627 10/21/16 0452 10/22/16 0830 10/23/16 0540  CREATININE 0.8 0.52 0.65 0.63 0.68   Estimated Creatinine Clearance: 38.3 mL/min (by C-G formula based on SCr of 0.68 mg/dL).    Allergies  Allergen Reactions  . Tuberculin Tests Other (See Comments)    UNSPECIFIED REACTION  Pt is allergic to PPD (The PPD skin test is a method used to diagnose silent (latent) tuberculosis (TB) infection. PPD stands for purified protein derivative.) Pt should receive chest x-rays instead.   Antimicrobials this admission: 5/1 Ancef >> 5/4 5/4 Zosyn >>   Microbiology results: No cultures  Thank you for allowing pharmacy to be a part of this patient's care.  Minda Ditto PharmD Pager [phone] 10/23/2016, 12:45 PM

## 2016-10-23 NOTE — Progress Notes (Signed)
Nursing Note: Pt sounds more wet,gurgly w/o use of stethoscope.A; On-call aware and orders received for 20 lasix IV and to decrease IVFs to [facility].

## 2016-10-23 NOTE — Progress Notes (Signed)
COPD  Maintenance med for here and home?

## 2016-10-23 NOTE — Significant Event (Signed)
CRITICAL VALUE ALERT  Critical value received:  Wbc-63.1  Date of notification:  10/23/2016  Time of notification:  0657  Critical value read back:yes  Nurse who received alert:  Dorothyann Peng Rowyn Mustapha,rn  MD notified (1st page):  Dr.Gorsuch  Time of first page:  [phone]  MD notified (2nd page):  Time of second page:  Responding MD:  Dr.Gorsuch  Time MD responded:  [phone]

## 2016-10-23 NOTE — Progress Notes (Signed)
[name]   DOB:[birth year]   VO#:[id number]    Subjective: Noted events overnight.  She have episode of spells with desaturation.  This morning, she had mild trouble with breathing and mild respiratory distress.  IV fluid was discontinued  Objective:  Vitals:   10/23/16 0106 10/23/16 0445  BP: (!) 135/58 (!) 132/47  Pulse: (!) 53 (!) 56  Resp: 18 16  Temp:  98.9 F (37.2 C)     Intake/Output Summary (Last 24 hours) at 10/23/16 0818 Last data filed at 10/23/16 0500  Gross per 24 hour  Intake             1738 ml  Output             1500 ml  Net              238 ml    GENERAL:alert, no distress and comfortable SKIN: skin color, texture, turgor are normal, no rashes or significant lesions EYES: normal, Conjunctiva are pink and non-injected, sclera clear OROPHARYNX: She has modest amount of secretions in her mouth NECK: She has large palpable lymphadenopathy on both sides of the neck with draining wound over the right side  LUNGS: Reduced breath sounds over the right lung with rhonchi HEART: regular rate & rhythm and no murmurs and no lower extremity edema ABDOMEN:abdomen soft, non-tender and normal bowel sounds Musculoskeletal:no cyanosis of digits and no clubbing  NEURO: alert & oriented x 3 with fluent speech, no focal motor/sensory deficits   Labs:  Lab Results  Component Value Date   WBC 63.1 (HH) 10/23/2016   HGB 10.9 (L) 10/23/2016   HCT 33.9 (L) 10/23/2016   MCV 84.3 10/23/2016   PLT 327 10/23/2016   NEUTROABS 60.6 (H) 10/23/2016    Lab Results  Component Value Date   NA 137 10/23/2016   K 5.3 (H) 10/23/2016   CL 105 10/23/2016   CO2 27 10/23/2016    Studies:  Ir Fluoro Guide Cv Line Right  Result Date: 10/22/2016 INDICATION: Patient with history of tongue cancer; central venous access requested for chemotherapy. EXAM: RIGHT UPPER EXTREMITY PICC LINE PLACEMENT WITH ULTRASOUND AND FLUOROSCOPIC GUIDANCE MEDICATIONS: None ANESTHESIA/SEDATION: None  FLUOROSCOPY TIME:  Fluoroscopy Time:  12 seconds COMPLICATIONS: None immediate. PROCEDURE: The patient/family was advised of the possible risks and complications and agreed to undergo the procedure. The patient was then brought to the angiographic suite for the procedure. The right arm was prepped with chlorhexidine, draped in the usual sterile fashion using maximum barrier technique (cap and mask, sterile gown, sterile gloves, large sterile sheet, hand hygiene and cutaneous antisepsis) and infiltrated locally with 1% Lidocaine. Ultrasound demonstrated patency of the right basilic vein, and this was documented with an image. Under real-time ultrasound guidance, this vein was accessed with a 21 gauge micropuncture needle and image documentation was performed. A 0.018 wire was introduced in to the vein. Over this, a 5 [place] double lumen power injectable PICC was advanced to the lower SVC/right atrial junction. Fluoroscopy during the procedure and fluoro spot radiograph confirms appropriate catheter position. The catheter was flushed and covered with a sterile dressing. Catheter length: 36 cm IMPRESSION: Successful right arm power PICC line placement with ultrasound and fluoroscopic guidance. The catheter is ready for use. Read by: Rowe Robert, PA-C Electronically Signed   By: Sandi Mariscal M.D.   On: 10/22/2016 11:49   Ir US Guide Vasc Access Right  Result Date: 10/22/2016 INDICATION: Patient with history of tongue cancer; central venous  access requested for chemotherapy. EXAM: RIGHT UPPER EXTREMITY PICC LINE PLACEMENT WITH ULTRASOUND AND FLUOROSCOPIC GUIDANCE MEDICATIONS: None ANESTHESIA/SEDATION: None FLUOROSCOPY TIME:  Fluoroscopy Time:  12 seconds COMPLICATIONS: None immediate. PROCEDURE: The patient/family was advised of the possible risks and complications and agreed to undergo the procedure. The patient was then brought to the angiographic suite for the procedure. The right arm was prepped with  chlorhexidine, draped in the usual sterile fashion using maximum barrier technique (cap and mask, sterile gown, sterile gloves, large sterile sheet, hand hygiene and cutaneous antisepsis) and infiltrated locally with 1% Lidocaine. Ultrasound demonstrated patency of the right basilic vein, and this was documented with an image. Under real-time ultrasound guidance, this vein was accessed with a 21 gauge micropuncture needle and image documentation was performed. A 0.018 wire was introduced in to the vein. Over this, a 5 [place] double lumen power injectable PICC was advanced to the lower SVC/right atrial junction. Fluoroscopy during the procedure and fluoro spot radiograph confirms appropriate catheter position. The catheter was flushed and covered with a sterile dressing. Catheter length: 36 cm IMPRESSION: Successful right arm power PICC line placement with ultrasound and fluoroscopic guidance. The catheter is ready for use. Read by: Rowe Robert, PA-C Electronically Signed   By: Sandi Mariscal M.D.   On: 10/22/2016 11:49   Dg Chest Port 1 View  Result Date: 10/23/2016 CLINICAL DATA:  Cough and shortness of breath EXAM: PORTABLE CHEST 1 VIEW COMPARISON:  Oct 20, 2016 FINDINGS: There is extensive airspace consolidation throughout much of the right lung. There is a degree of superimposed pleural effusion on the right. Left lung is clear. Heart is upper normal in size with pulmonary vascularity normal. There is aortic atherosclerosis. No evident adenopathy. Central catheter tip is at cavoatrial junction. No pneumothorax. Calcification is noted in the right carotid artery with what appears to be dilatation in the right carotid artery measuring 1.3 cm. IMPRESSION: Extensive airspace consolidation throughout much the right lung with superimposed right pleural effusion. Suspect widespread right lung pneumonia. Left lung clear. Stable cardiac silhouette. Aortic atherosclerosis. Calcification in the right carotid artery with  apparent aneurysmal dilatation, calcified, in the right carotid artery. Electronically Signed   By: Lowella Grip III M.D.   On: 10/23/2016 07:20   Dg Swallowing Func-speech Pathology  Result Date: 10/21/2016 Objective Swallowing Evaluation: Type of Study: MBS-Modified Barium Swallow Study Patient Details Name: MARKIAH JANEWAY MRN: [id number] Date of Birth: 1933/10/30 Today's Date: 10/21/2016 Time: SLP Start Time (ACUTE ONLY): 1457-SLP Stop Time (ACUTE ONLY): 4627 SLP Time Calculation (min) (ACUTE ONLY): 20 min Past Medical History: Past Medical History: Diagnosis Date . Abdominal pain  . Blood dyscrasia   HYPOKALEMIA . Blood transfusion  . COPD (chronic obstructive pulmonary disease) ([place])  . Dementia   pathologic aging and dementia per daughter . Diabetes mellitus   Type II . Heart murmur  . History of blood transfusion  . Hypertension  . Hypothyroid  . Neuropathy  . Substance abuse   ETOH with dementia and prior encephalopathy and withdrawal . Tongue cancer ([place]) 05/13/2016 . Tricuspid regurgitation 2012  mild . Urinary tract infection  Past Surgical History: Past Surgical History: Procedure Laterality Date . ABDOMINAL HYSTERECTOMY    1970's . BREAST SURGERY Left   Lumpectomy . COLONOSCOPY   . ESOPHAGOGASTRODUODENOSCOPY  05/17/2011  Procedure: ESOPHAGOGASTRODUODENOSCOPY (EGD);  Surgeon: Lafayette Dragon, MD;  Location: [facility] ENDOSCOPY;  Service: Endoscopy;  Laterality: N/A; . EXCISION OF TONGUE LESION Left 05/13/2016  Procedure: WIDE LOCAL EXCISION  OF LEFT LATERAL TONGUE CANCER;  Surgeon: Jerrell Belfast, MD;  Location: [place];  Service: ENT;  Laterality: Left; . EXCISION OF TONGUE LESION Left 06/12/2016  Procedure: RE-EXCISION OF LEFT  TONGUE CANCER;  Surgeon: Jerrell Belfast, MD;  Location: [place];  Service: ENT;  Laterality: Left; . EYE SURGERY Bilateral   Cataract . TONGUE SURGERY  05/13/2016  WIDE LOCAL EXCISION OF LEFT LATERAL TONGUE CANCER (Left) with Local Soft Tissue Reconstruction  . TONSILLECTOMY AND  ADENOIDECTOMY   HPI: Pt is an 83 yo female adm to [facility] with h/o cancer of anterior 2/3 tongue s/p surgery 05/2016 and now has rapidly growing tumor in her left neck and jaw region for which she is scheduled to have radiation.   Massive neck/cervical lymphadenopathy 8.5 cm with invasion of IJ with tumor thrombus per MD note.   Pt with left supraglottic larynx/pharynx, right submandibular - node 4 cm.  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  Swallow evaluation ordered.   Pt denies ever having xray swallow study done before.  Subjective: pt awake in chair Assessment / Plan / Recommendation CHL IP CLINICAL IMPRESSIONS 10/21/2016 Clinical Impression Pt tested in lateral and A-P view.  Pt presents with mild oropharyngeal dysphagia without aspiration of any consistency tested (thin, nectar, pudding, cracker and barium tablet). Decreased oral manipulation due to pt's lingual surgery noted.  Pharyngeal swallow was overall strong without significant residuals/aspiration.  Very trace laryngeal penetration of thin liquid secondary to decreased timing of laryngeal closure cleared indpendently.  Of note, pt voice was clear prior to and during MBS - she reports wet voice occuring AFTER meals.  Pt is likely having aspiration of secretions but was protective of her airway with MBS.   Using live imaging, educated pt to findings/recommendations.   When Md indicates, recommend advance to dys3/thin due to oral deficits with strict precautions. SlP to follow briefly for pt education/po toelrance.  Thanks for this consult.  SLP Visit Diagnosis Dysphagia, oropharyngeal phase (R13.12) Attention and concentration deficit following -- Frontal lobe and executive function deficit following -- Impact on safety and function Mild aspiration risk   CHL IP TREATMENT RECOMMENDATION 10/21/2016 Treatment Recommendations Therapy as outlined in treatment plan below   Prognosis  10/21/2016 Prognosis for Safe Diet Advancement Fair Barriers to Reach Goals Severity of deficits;Time post onset Barriers/Prognosis Comment -- CHL IP DIET RECOMMENDATION 10/21/2016 SLP Diet Recommendations -- Liquid Administration via Straw Medication Administration Whole meds with liquid Compensations Slow rate;Small sips/bites;Follow solids with liquid Postural Changes Remain semi-upright after after feeds/meals (Comment);Seated upright at 90 degrees   CHL IP OTHER RECOMMENDATIONS 10/21/2016 Recommended Consults -- Oral Care Recommendations Oral care BID Other Recommendations --   CHL IP FOLLOW UP RECOMMENDATIONS 10/21/2016 Follow up Recommendations (No Data)   CHL IP FREQUENCY AND DURATION 10/21/2016 Speech Therapy Frequency (ACUTE ONLY) min 1 x/week Treatment Duration 1 week      CHL IP ORAL PHASE 10/21/2016 Oral Phase Impaired Oral - Pudding Teaspoon -- Oral - Pudding Cup -- Oral - Honey Teaspoon -- Oral - Honey Cup -- Oral - Nectar Teaspoon -- Oral - Nectar Cup Weak lingual manipulation Oral - Nectar Straw Weak lingual manipulation Oral - Thin Teaspoon Weak lingual manipulation Oral - Thin Cup Weak lingual manipulation Oral - Thin Straw Weak lingual manipulation Oral - Puree Weak lingual manipulation;Reduced posterior propulsion Oral - Mech Soft Weak lingual manipulation;Impaired mastication;Reduced posterior propulsion Oral -  Regular -- Oral - Multi-Consistency -- Oral - Pill Weak lingual manipulation;Reduced posterior propulsion Oral Phase - Comment --  CHL IP PHARYNGEAL PHASE 10/21/2016 Pharyngeal Phase Impaired Pharyngeal- Pudding Teaspoon -- Pharyngeal -- Pharyngeal- Pudding Cup -- Pharyngeal -- Pharyngeal- Honey Teaspoon -- Pharyngeal -- Pharyngeal- Honey Cup -- Pharyngeal -- Pharyngeal- Nectar Teaspoon -- Pharyngeal -- Pharyngeal- Nectar Cup WFL Pharyngeal -- Pharyngeal- Nectar Straw WFL Pharyngeal -- Pharyngeal- Thin Teaspoon WFL Pharyngeal -- Pharyngeal- Thin Cup WFL Pharyngeal -- Pharyngeal- Thin Straw WFL  Pharyngeal -- Pharyngeal- Puree WFL Pharyngeal -- Pharyngeal- Mechanical Soft WFL Pharyngeal -- Pharyngeal- Regular -- Pharyngeal -- Pharyngeal- Multi-consistency -- Pharyngeal -- Pharyngeal- Pill WFL Pharyngeal -- Pharyngeal Comment --  CHL IP CERVICAL ESOPHAGEAL PHASE 10/21/2016 Cervical Esophageal Phase Impaired Pudding Teaspoon -- Pudding Cup -- Honey Teaspoon -- Honey Cup -- Nectar Teaspoon -- Nectar Cup -- Nectar Straw -- Thin Teaspoon -- Thin Cup -- Thin Straw -- Puree -- Mechanical Soft -- Regular -- Multi-consistency -- Pill -- Cervical Esophageal Comment barium tablet appeared to lodge at mid-esophagus without awareness, further bolus of pudding did not faciliate clearance however multiple boluses of liquids effective;  radiologist is not present and MBS is not designed to examine below UES - if MD deems indicated, pt may benefit from dedicated esophageal evaluation No flowsheet data found. Luanna Salk, [facility] SLP [phone]               Assessment & Plan:   Tongue cancer [facility]) The patient has complex past medical history including renal failure on hemodialysis (which has since resolved), severe substance abuse especially with alcoholism (has been abstinent)and background history of dementia. I have read a lot of the past medicalrecords, in collaboration with history of fromher daughter who appears to be the principal caregiver. In essence, this patient has rapidly progressive disease in the neck with impeding airway compromise without further treatment. There is a concern about possible airway compromise with airway swelling with radiation treatment After extensive discussion at the ENT tumor board, the decision is made for her to undergo chemotherapy in the hospital first in an attempt to shrink down the tumor PICC line was placed on 10/22/2016 with first dose of reduced dose cisplatin Continue aggressive supportive care  Malignant hypercalcemia, improving This has improvedon IV fluid  resuscitation, IV dexamethasone, nasal calcitonin and Lasix  Continue the same  Severe hypertension Improving on antihypertensives We will stop atenolol due to bradycardia Hopefully will improve with reduce IV fluid administration  Imminent airway compromise with mild stridor and excessive airway secretion Due to disease progression I will start her on dexamethasone IV twice a day and monitor carefully ENT service had evaluated her imaging and felt that tracheostomy at this point is not possible I plan to start her on some Robinul to reduce secretions  Aspiration pneumonia Formal swallow evaluation, result available, risk of aspiration Repeat chest x-ray this morning on 10/23/2016 show evidence of pneumonia, likely due to aspiration pneumonia We will consult pharmacist and switch her antibiotic coverage to Zosyn  Leukocytosis with necrotic tumor with serosanguineous discharge/cellulitis Likely multifactorial due to pneumonia and steroids Continue antibiotic coverage and continue dry dressing changes  Severe hypokalemia, resolved  Steroid induced hyperglycemia We will titrate up insulin  Severe cancer pain I will get her schedule liquid morphine and will reassess tomorrow Continue IV as needed morphine  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 After extensive review of prior evaluation by neurologist from [place], I felt that the patient indeed may have age related dementia but appears to be coping well with aggressive social support  CODE STATUS DNR  Goals of care, counseling/discussion The patient is aware shehas incurable disease and treatment is strictly palliative. We discussed importance of Advanced Directives and Living will. Her daughter appears to be the healthcare medical power of attorney I discussed goals of care with the  patient's daughter and she agree with the plan of care as above  Discharge planning The patient had multiple reason to be admitted, namely malignant hypercalcemia, severe hypokalemia, leukocytosis with draining necrotic tumor and imminent airway compromise. The risk of not admitting her would be risk of death She will likely to be here the next 3-5 days She may benefit from skilled nursing facility after discharge  Heath Lark, MD 10/23/2016  8:18 AM

## 2016-10-23 NOTE — Progress Notes (Signed)
Pt resting quielty in bed,asleep.Pt has rested well since dose of lasix earlier.wbb

## 2016-10-23 NOTE — Progress Notes (Signed)
I saw her again around 4 PM.  She is resting better.  Vital signs are stable.  Repeat labs showed high uric acid level.  I prescribed 1 dose of rasburicase. BNP level is high.  I gave her 1 more dose of Lasix IV fluid is stopped.  Oxygen is titrated down.  With bradycardia, I have discontinued atenolol I will check on her again tomorrow.  Her daughter is updated

## 2016-10-23 NOTE — Progress Notes (Signed)
Nursing Note: No longer sounds gurgly just standing at the bedside.Sats >93% on non-rebreather.Pt destats shen getting up to the bedside commode,then sats come back up once back in bed and settled.wbb

## 2016-10-23 NOTE — Progress Notes (Signed)
Nursing Note: Called to the bedside.Pt assisted to the bathroom[ambulated],when sats dropped to mid-80s.A: Non-rebreather applied till sats greater than 90%.Pt up to 10L on non-rebreather.Pt admits to feeling  A little SOB.Pt sounds wet,lungs rhonci to bases.A; Paged resp.T-98.2 Bp160/89 P-58 R-22 PO2 up to 93% on non-rebreather.Pt has continuous pulse ox at bedside.Paged on-call.wbb

## 2016-10-23 NOTE — Care Management Note (Signed)
Case Management Note  Patient Details  Name: VERBLE STYRON MRN: [id number] Date of Birth: 09-13-1933  Subjective/Objective:             83 yo admitted with cancer of the tongue and malignant hypercalcemia.       Action/Plan: Pt from home alone and independent with ADLs prior to admission. CM will continue to follow along and assist with DC planning as needed.  Expected Discharge Date:  10/23/15               Expected Discharge Plan:  Home/Self Care  In-House Referral:     Discharge planning Services  CM Consult  Post Acute Care Choice:    Choice offered to:     DME Arranged:    DME Agency:     HH Arranged:    HH Agency:     Status of Service:  In process, will continue to follow  If discussed at Long Length of Stay Meetings, dates discussed:    Additional CommentsLynnell Catalan, RN 10/23/2016, 10:06 AM  [phone]

## 2016-10-24 DIAGNOSIS — K5900 Constipation, unspecified: Secondary | ICD-10-CM

## 2016-10-24 LAB — COMPREHENSIVE METABOLIC PANEL
ALT: 7 U/L — ABNORMAL LOW (ref 14–54)
ANION GAP: 7 (ref 5–15)
AST: 12 U/L — ABNORMAL LOW (ref 15–41)
Albumin: 2.8 g/dL — ABNORMAL LOW (ref 3.5–5.0)
Alkaline Phosphatase: 92 U/L (ref 38–126)
BUN: 29 mg/dL — ABNORMAL HIGH (ref 6–20)
CHLORIDE: 98 mmol/L — AB (ref 101–111)
CO2: 32 mmol/L (ref 22–32)
Calcium: 11.1 mg/dL — ABNORMAL HIGH (ref 8.9–10.3)
Creatinine, Ser: 0.98 mg/dL (ref 0.44–1.00)
GFR calc non Af Amer: 53 mL/min — ABNORMAL LOW (ref >60)
Glucose, Bld: 147 mg/dL — ABNORMAL HIGH (ref 65–99)
Potassium: 4.5 mmol/L (ref 3.5–5.1)
Sodium: 137 mmol/L (ref 135–145)
Total Bilirubin: 0.5 mg/dL (ref 0.3–1.2)
Total Protein: 6.5 g/dL (ref 6.5–8.1)

## 2016-10-24 LAB — CBC WITH DIFFERENTIAL/PLATELET
BASOS PCT: 0 %
Basophils Absolute: 0 10*3/uL (ref 0.0–0.1)
Eosinophils Absolute: 0.1 10*3/uL (ref 0.0–0.7)
Eosinophils Relative: 0 %
HCT: 34.2 % — ABNORMAL LOW (ref 36.0–46.0)
HEMOGLOBIN: 11.1 g/dL — AB (ref 12.0–15.0)
LYMPHS ABS: 1.7 10*3/uL (ref 0.7–4.0)
LYMPHS PCT: 3 %
MCH: 27.4 pg (ref 26.0–34.0)
MCHC: 32.5 g/dL (ref 30.0–36.0)
MCV: 84.4 fL (ref 78.0–100.0)
MONOS PCT: 2 %
Monocytes Absolute: 1.4 10*3/uL — ABNORMAL HIGH (ref 0.1–1.0)
NEUTROS ABS: 63.7 10*3/uL — AB (ref 1.7–7.7)
NEUTROS PCT: 95 %
Platelets: 323 10*3/uL (ref 150–400)
RBC: 4.05 MIL/uL (ref 3.87–5.11)
RDW: 16.8 % — ABNORMAL HIGH (ref 11.5–15.5)
WBC MORPHOLOGY: INCREASED
WBC: 66.9 10*3/uL (ref 4.0–10.5)

## 2016-10-24 LAB — GLUCOSE, CAPILLARY
GLUCOSE-CAPILLARY: 191 mg/dL — AB (ref 65–99)
Glucose-Capillary: 134 mg/dL — ABNORMAL HIGH (ref 65–99)
Glucose-Capillary: 325 mg/dL — ABNORMAL HIGH (ref 65–99)

## 2016-10-24 LAB — BRAIN NATRIURETIC PEPTIDE: B Natriuretic Peptide: 601.4 pg/mL — ABNORMAL HIGH (ref 0.0–100.0)

## 2016-10-24 MED ORDER — FUROSEMIDE 10 MG/ML IJ SOLN
20.0000 mg | Freq: Two times a day (BID) | INTRAMUSCULAR | Status: AC
  Administered 2016-10-24 – 2016-10-25 (×4): 20 mg via INTRAVENOUS
  Filled 2016-10-24 (×4): qty 2

## 2016-10-24 MED ORDER — ZOLEDRONIC ACID 4 MG/5ML IV CONC
3.0000 mg | Freq: Once | INTRAVENOUS | Status: AC
  Administered 2016-10-24: 3 mg via INTRAVENOUS
  Filled 2016-10-24: qty 3.75

## 2016-10-24 MED ORDER — STERILE WATER FOR INJECTION IV SOLN
INTRAVENOUS | Status: DC
  Administered 2016-10-24 – 2016-10-25 (×2): via INTRAVENOUS
  Filled 2016-10-24 (×5): qty 850

## 2016-10-24 MED ORDER — ALBUTEROL SULFATE (2.5 MG/3ML) 0.083% IN NEBU: 2.5000 mg | INHALATION_SOLUTION | Freq: Four times a day (QID) | RESPIRATORY_TRACT | Status: DC | PRN

## 2016-10-24 MED ORDER — SENNOSIDES-DOCUSATE SODIUM 8.6-50 MG PO TABS
1.0000 | ORAL_TABLET | Freq: Two times a day (BID) | ORAL | Status: DC
  Administered 2016-10-24 – 2016-10-31 (×9): 1 via ORAL
  Filled 2016-10-24 (×11): qty 1

## 2016-10-24 NOTE — Progress Notes (Signed)
[name]   DOB:Feb 14, 1934   XL#:[id number]    Subjective: She slept all right.  Mucus secretion has reduced dramatically.  She still have bilateral rhonchi.  Vital signs are better with good oxygen saturation, on continuous oxygen.  She continues to have drainage from the right neck.  The right neck mass has reduced in size  Objective:  Vitals:   10/23/16 2147 10/24/16 0528  BP: (!) 138/54 127/61  Pulse: (!) 42 (!) 47  Resp: 12 14  Temp: 97.9 F (36.6 C) 97.5 F (36.4 C)     Intake/Output Summary (Last 24 hours) at 10/24/16 0741 Last data filed at 10/24/16 0529  Gross per 24 hour  Intake             1180 ml  Output              400 ml  Net              780 ml    GENERAL:alert, no distress and comfortable SKIN: skin color, texture, turgor are normal, no rashes or significant lesions EYES: normal, Conjunctiva are pink and non-injected, sclera clear OROPHARYNX:no exudate, no erythema and lips, buccal mucosa without mucositis or thrush.  Excessive secretion had resolved NECK: She continues to have serosanguineous drainage from the right neck mass The left neck mass is stable in size, smaller LUNGS: Bilateral rhonchi. HEART: regular rate & rhythm and no murmurs and no lower extremity edema ABDOMEN:abdomen soft, non-tender and normal bowel sounds Musculoskeletal:no cyanosis of digits and no clubbing  NEURO: alert & oriented x 3 with fluent speech, no focal motor/sensory deficits   Labs:  Lab Results  Component Value Date   WBC 66.9 (HH) 10/24/2016   HGB 11.1 (L) 10/24/2016   HCT 34.2 (L) 10/24/2016   MCV 84.4 10/24/2016   PLT 323 10/24/2016   NEUTROABS 63.7 (H) 10/24/2016    Lab Results  Component Value Date   NA 137 10/24/2016   K 4.5 10/24/2016   CL 98 (L) 10/24/2016   CO2 32 10/24/2016    Studies:  Ir Fluoro Guide Cv Line Right  Result Date: 10/22/2016 INDICATION: Patient with history of tongue cancer; central venous access requested for chemotherapy.  EXAM: RIGHT UPPER EXTREMITY PICC LINE PLACEMENT WITH ULTRASOUND AND FLUOROSCOPIC GUIDANCE MEDICATIONS: None ANESTHESIA/SEDATION: None FLUOROSCOPY TIME:  Fluoroscopy Time:  12 seconds COMPLICATIONS: None immediate. PROCEDURE: The patient/family was advised of the possible risks and complications and agreed to undergo the procedure. The patient was then brought to the angiographic suite for the procedure. The right arm was prepped with chlorhexidine, draped in the usual sterile fashion using maximum barrier technique (cap and mask, sterile gown, sterile gloves, large sterile sheet, hand hygiene and cutaneous antisepsis) and infiltrated locally with 1% Lidocaine. Ultrasound demonstrated patency of the right basilic vein, and this was documented with an image. Under real-time ultrasound guidance, this vein was accessed with a 21 gauge micropuncture needle and image documentation was performed. A 0.018 wire was introduced in to the vein. Over this, a 5 [place] double lumen power injectable PICC was advanced to the lower SVC/right atrial junction. Fluoroscopy during the procedure and fluoro spot radiograph confirms appropriate catheter position. The catheter was flushed and covered with a sterile dressing. Catheter length: 36 cm IMPRESSION: Successful right arm power PICC line placement with ultrasound and fluoroscopic guidance. The catheter is ready for use. Read by: Rowe Robert, PA-C Electronically Signed   By: Sandi Mariscal M.D.   On: 10/22/2016  11:49   Ir US Guide Vasc Access Right  Result Date: 10/22/2016 INDICATION: Patient with history of tongue cancer; central venous access requested for chemotherapy. EXAM: RIGHT UPPER EXTREMITY PICC LINE PLACEMENT WITH ULTRASOUND AND FLUOROSCOPIC GUIDANCE MEDICATIONS: None ANESTHESIA/SEDATION: None FLUOROSCOPY TIME:  Fluoroscopy Time:  12 seconds COMPLICATIONS: None immediate. PROCEDURE: The patient/family was advised of the possible risks and complications and agreed to undergo  the procedure. The patient was then brought to the angiographic suite for the procedure. The right arm was prepped with chlorhexidine, draped in the usual sterile fashion using maximum barrier technique (cap and mask, sterile gown, sterile gloves, large sterile sheet, hand hygiene and cutaneous antisepsis) and infiltrated locally with 1% Lidocaine. Ultrasound demonstrated patency of the right basilic vein, and this was documented with an image. Under real-time ultrasound guidance, this vein was accessed with a 21 gauge micropuncture needle and image documentation was performed. A 0.018 wire was introduced in to the vein. Over this, a 5 [place] double lumen power injectable PICC was advanced to the lower SVC/right atrial junction. Fluoroscopy during the procedure and fluoro spot radiograph confirms appropriate catheter position. The catheter was flushed and covered with a sterile dressing. Catheter length: 36 cm IMPRESSION: Successful right arm power PICC line placement with ultrasound and fluoroscopic guidance. The catheter is ready for use. Read by: Rowe Robert, PA-C Electronically Signed   By: Sandi Mariscal M.D.   On: 10/22/2016 11:49   Dg Chest Port 1 View  Result Date: 10/23/2016 CLINICAL DATA:  Cough and shortness of breath EXAM: PORTABLE CHEST 1 VIEW COMPARISON:  Oct 20, 2016 FINDINGS: There is extensive airspace consolidation throughout much of the right lung. There is a degree of superimposed pleural effusion on the right. Left lung is clear. Heart is upper normal in size with pulmonary vascularity normal. There is aortic atherosclerosis. No evident adenopathy. Central catheter tip is at cavoatrial junction. No pneumothorax. Calcification is noted in the right carotid artery with what appears to be dilatation in the right carotid artery measuring 1.3 cm. IMPRESSION: Extensive airspace consolidation throughout much the right lung with superimposed right pleural effusion. Suspect widespread right lung  pneumonia. Left lung clear. Stable cardiac silhouette. Aortic atherosclerosis. Calcification in the right carotid artery with apparent aneurysmal dilatation, calcified, in the right carotid artery. Electronically Signed   By: Lowella Grip III M.D.   On: 10/23/2016 07:20    Assessment & Plan:   Tongue cancer [facility]) The patient has complex past medical history including renal failure on hemodialysis (which has since resolved), severe substance abuse especially with alcoholism (has been abstinent)and background history of dementia. I have read a lot of the past medicalrecords, in collaboration with history of fromher daughter who appears to be the principal caregiver. In essence, this patient has rapidly progressive disease in the neck with impeding airway compromise without further treatment. There is a concern about possible airway compromise with airway swelling with radiation treatment After extensive discussion at the ENT tumor board, the decision is made for her to undergo chemotherapy in the hospital first in an attempt to shrink down the tumor PICC line was placed on 10/22/2016 with first dose of reduced dose cisplatin Continue aggressive supportive care  Malignant hypercalcemia, improving This has improvedon IV fluid resuscitation, IV dexamethasone, nasal calcitonin and Lasix  Continue the same Will add IV Zometa  Severe hypertension, resolved Improving on antihypertensives We will stop atenolol due to bradycardia Hopefully will improve with reduce IV fluid administration  Imminent airway compromise  with mild stridor and excessive airway secretion Due to disease progression I will start her on dexamethasone IV twice a day and monitor carefully ENT service had evaluated her imaging and felt that tracheostomy at this point is not possible Robinul has reduced secretions, will DC  Aspiration pneumonia Formal swallow evaluation, result available, risk of aspiration Repeat  chest x-ray this morning on 10/23/2016 show evidence of pneumonia, likely due to aspiration pneumonia We will consult pharmacist and switch her antibiotic coverage to Zosyn Will also add continuous oxygen and Nebulizers  Leukocytosis with necrotic tumor with serosanguineous discharge/cellulitis Likely multifactorial due to pneumonia and steroids Continue antibiotic coverage and continue dry dressing changes  Severe hypokalemia, resolved  Severe constipation Will add Senokot  Steroid induced hyperglycemia We will titrate up insulin  Severe cancer pain I will get her schedule liquid morphine and will reassess tomorrow Continue IV as needed morphine  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 After extensive review of prior evaluation by neurologist from [place], I felt that the patient indeed may have age related dementia but appears to be coping well with aggressive social support  CODE STATUS DNR  Goals of care, counseling/discussion The patient is aware shehas incurable disease and treatment is strictly palliative. We discussed importance of Advanced Directives and Living will. Her daughter appears to be the healthcare medical power of attorney I discussed goals of care with the patient's daughter and she agree with the plan of care as above  Discharge planning The patient had multiple reason to be admitted, namely malignant hypercalcemia, severe hypokalemia, leukocytosis with draining necrotic tumor and imminent airway compromise. The risk of not admitting her would be risk of death She will likely to be here the next 3-5 days She may benefit from skilled nursing facility after discharge   Heath Lark, MD 10/24/2016  7:41 AM

## 2016-10-25 LAB — CBC WITH DIFFERENTIAL/PLATELET
BASOS ABS: 0 10*3/uL (ref 0.0–0.1)
Basophils Relative: 0 %
Eosinophils Absolute: 0 10*3/uL (ref 0.0–0.7)
Eosinophils Relative: 0 %
HCT: 34 % — ABNORMAL LOW (ref 36.0–46.0)
Hemoglobin: 11 g/dL — ABNORMAL LOW (ref 12.0–15.0)
LYMPHS ABS: 1.1 10*3/uL (ref 0.7–4.0)
Lymphocytes Relative: 2 %
MCH: 27.3 pg (ref 26.0–34.0)
MCHC: 32.4 g/dL (ref 30.0–36.0)
MCV: 84.4 fL (ref 78.0–100.0)
MONO ABS: 2.8 10*3/uL — AB (ref 0.1–1.0)
Monocytes Relative: 5 %
NEUTROS ABS: 52 10*3/uL — AB (ref 1.7–7.7)
Neutrophils Relative %: 93 %
PLATELETS: 267 10*3/uL (ref 150–400)
RBC: 4.03 MIL/uL (ref 3.87–5.11)
RDW: 16.5 % — AB (ref 11.5–15.5)
WBC: 55.9 10*3/uL — AB (ref 4.0–10.5)

## 2016-10-25 LAB — GLUCOSE, CAPILLARY
GLUCOSE-CAPILLARY: 155 mg/dL — AB (ref 65–99)
GLUCOSE-CAPILLARY: 189 mg/dL — AB (ref 65–99)
GLUCOSE-CAPILLARY: 103 mg/dL — AB (ref 65–99)
Glucose-Capillary: 171 mg/dL — ABNORMAL HIGH (ref 65–99)

## 2016-10-25 LAB — COMPREHENSIVE METABOLIC PANEL
ALBUMIN: 2.9 g/dL — AB (ref 3.5–5.0)
ALT: 8 U/L — ABNORMAL LOW (ref 14–54)
ANION GAP: 8 (ref 5–15)
AST: 12 U/L — AB (ref 15–41)
Alkaline Phosphatase: 82 U/L (ref 38–126)
BUN: 38 mg/dL — AB (ref 6–20)
CHLORIDE: 94 mmol/L — AB (ref 101–111)
CO2: 38 mmol/L — ABNORMAL HIGH (ref 22–32)
Calcium: 9.3 mg/dL (ref 8.9–10.3)
Creatinine, Ser: 0.96 mg/dL (ref 0.44–1.00)
GFR calc Af Amer: >60 mL/min (ref >60)
GFR, EST NON AFRICAN AMERICAN: 54 mL/min — AB (ref >60)
Glucose, Bld: 193 mg/dL — ABNORMAL HIGH (ref 65–99)
POTASSIUM: 3.5 mmol/L (ref 3.5–5.1)
Sodium: 140 mmol/L (ref 135–145)
TOTAL PROTEIN: 6.1 g/dL — AB (ref 6.5–8.1)
Total Bilirubin: 0.3 mg/dL (ref 0.3–1.2)

## 2016-10-25 LAB — BRAIN NATRIURETIC PEPTIDE: B NATRIURETIC PEPTIDE 5: 366.9 pg/mL — AB (ref 0.0–100.0)

## 2016-10-25 MED ORDER — INSULIN GLARGINE 100 UNIT/ML ~~LOC~~ SOLN
12.0000 [IU] | Freq: Every day | SUBCUTANEOUS | Status: DC
  Administered 2016-10-25: 12 [IU] via SUBCUTANEOUS
  Filled 2016-10-25 (×2): qty 0.12

## 2016-10-25 NOTE — Progress Notes (Signed)
[name]   DOB:1933/07/18   UY#:[id number]    Subjective: She is better today.  She has no bowel movement yet.  She is eating very little by mouth.  No reported aspiration.  Her blood pressure is down.  No fever or chills. Oxygenation remained stable. Labs has not been drawn yet.  Objective:  Vitals:   10/24/16 2053 10/25/16 0535  BP: 101/65 (!) 149/78  Pulse: (!) 43 (!) 53  Resp: 16 16  Temp: 98.4 F (36.9 C) 98.3 F (36.8 C)     Intake/Output Summary (Last 24 hours) at 10/25/16 0818 Last data filed at 10/25/16 0759  Gross per 24 hour  Intake           1477.5 ml  Output             2050 ml  Net           -572.5 ml    GENERAL:alert, no distress and comfortable SKIN: She has persistent drainage from the necrotic tumor on the right neck  EYES: normal, Conjunctiva are pink and non-injected, sclera clear OROPHARYNX:no exudate, no erythema and lips, buccal mucosa, and tongue normal.  No excessive secretion noted NECK: The right neck mass is getting smaller.  The left neck mass is also smaller LUNGS: clear to auscultation and percussion with normal breathing effort.  Is clearing better today compared to the last 2 days HEART: regular rate & rhythm and no murmurs and no lower extremity edema ABDOMEN:abdomen soft, non-tender and normal bowel sounds Musculoskeletal:no cyanosis of digits and no clubbing  NEURO: alert & oriented x 3 with fluent speech, no focal motor/sensory deficits   Labs:  Lab Results  Component Value Date   WBC 66.9 (HH) 10/24/2016   HGB 11.1 (L) 10/24/2016   HCT 34.2 (L) 10/24/2016   MCV 84.4 10/24/2016   PLT 323 10/24/2016   NEUTROABS 63.7 (H) 10/24/2016    Lab Results  Component Value Date   NA 137 10/24/2016   K 4.5 10/24/2016   CL 98 (L) 10/24/2016   CO2 32 10/24/2016   Assessment & Plan:   Tongue cancer ([place]) The patient has complex past medical history including renal failure on hemodialysis (which has since resolved), severe substance  abuse especially with alcoholism (has been abstinent)and background history of dementia. I have read a lot of the past medicalrecords, in collaboration with history of fromher daughter who appears to be the principal caregiver. In essence, this patient has rapidly progressive disease in the neck with impeding airway compromise without further treatment. There is a concern about possible airway compromise with airway swelling with radiation treatment After extensive discussion at the ENT tumor board, the decision is made for her to undergo chemotherapy in the hospital first in an attempt to shrink down the tumor PICC line was placed on 10/22/2016 with first dose of reduced dose cisplatin Continue aggressive supportive care  Malignant hypercalcemia, improving This has improvedon IV fluid resuscitation, IV dexamethasone, nasal calcitonin and Lasix  Continue the same She received 1 dose of IV Zometa on 10/24/2016  Severe hypertension, resolved Improving on antihypertensives I have discontinue her blood pressure medications  Imminent airway compromise with mild stridor and excessive airway secretion Due to disease progression I will start her on dexamethasone IV twice a day and monitor carefully ENT service had evaluated her imaging and felt that tracheostomy at this point is not possible Excessive airway secretion has improved.  Aspiration pneumonia Formal swallow evaluation, result available, risk of  aspiration Repeat chest x-ray this morning on 10/23/2016 show evidence of pneumonia, likely due to aspiration pneumonia We will consult pharmacist and switch her antibiotic coverage to Zosyn Will also add continuous oxygen and Nebulizers  Leukocytosis with necrotic tumor with serosanguineous discharge/cellulitis Likely multifactorial due to pneumonia and steroids Continue antibiotic coverage andcontinue dry dressing changes  Severe hypokalemia, resolved  Severe constipation Will  add Senokot along with MiraLAX  Steroid induced hyperglycemia We will titrate up insulin  Severe cancer pain I will get her schedule liquid morphine and will reassess tomorrow Continue IV as needed morphine  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 After extensive review of prior evaluation by neurologist from [place], I felt that the patient indeed may have age related dementia but appears to be coping well with aggressive social support  CODE STATUS DNR  Goals of care, counseling/discussion The patient is aware shehas incurable disease and treatment is strictly palliative. We discussed importance of Advanced Directives and Living will. Her daughter appears to be the healthcare medical power of attorney I discussed goals of care with the patient's daughter and she agree with the plan of care as above  Discharge planning The patient had multiple reason to be admitted, namely malignant hypercalcemia, severe hypokalemia, leukocytosis with draining necrotic tumor and imminent airway compromise. The risk of not admitting her would be risk of death She will likely to be here the next 3-5 days She may benefit from skilled nursing facility after discharge   Heath Lark, MD 10/25/2016  8:18 AM

## 2016-10-26 LAB — COMPREHENSIVE METABOLIC PANEL
ALBUMIN: 3.1 g/dL — AB (ref 3.5–5.0)
ALK PHOS: 79 U/L (ref 38–126)
ALT: 8 U/L — AB (ref 14–54)
AST: 16 U/L (ref 15–41)
Anion gap: 11 (ref 5–15)
BUN: 32 mg/dL — ABNORMAL HIGH (ref 6–20)
CALCIUM: 8.8 mg/dL — AB (ref 8.9–10.3)
CO2: 41 mmol/L — AB (ref 22–32)
CREATININE: 0.89 mg/dL (ref 0.44–1.00)
Chloride: 88 mmol/L — ABNORMAL LOW (ref 101–111)
GFR calc Af Amer: >60 mL/min (ref >60)
GFR calc non Af Amer: 60 mL/min — ABNORMAL LOW (ref >60)
GLUCOSE: 147 mg/dL — AB (ref 65–99)
Potassium: 3.7 mmol/L (ref 3.5–5.1)
SODIUM: 140 mmol/L (ref 135–145)
Total Bilirubin: 0.3 mg/dL (ref 0.3–1.2)
Total Protein: 6.6 g/dL (ref 6.5–8.1)

## 2016-10-26 LAB — BRAIN NATRIURETIC PEPTIDE: B Natriuretic Peptide: 244.5 pg/mL — ABNORMAL HIGH (ref 0.0–100.0)

## 2016-10-26 LAB — GLUCOSE, CAPILLARY
GLUCOSE-CAPILLARY: 147 mg/dL — AB (ref 65–99)
GLUCOSE-CAPILLARY: 181 mg/dL — AB (ref 65–99)
Glucose-Capillary: 136 mg/dL — ABNORMAL HIGH (ref 65–99)

## 2016-10-26 MED ORDER — UNJURY CHICKEN SOUP POWDER
8.0000 [oz_av] | Freq: Three times a day (TID) | ORAL | Status: DC
  Administered 2016-10-27: 8 [oz_av] via ORAL
  Filled 2016-10-26 (×18): qty 27

## 2016-10-26 MED ORDER — DEXAMETHASONE SODIUM PHOSPHATE 4 MG/ML IJ SOLN
2.0000 mg | Freq: Two times a day (BID) | INTRAMUSCULAR | Status: DC
  Administered 2016-10-26 – 2016-10-28 (×6): 2 mg via INTRAVENOUS
  Filled 2016-10-26 (×6): qty 1

## 2016-10-26 MED ORDER — BOOST / RESOURCE BREEZE PO LIQD
1.0000 | Freq: Three times a day (TID) | ORAL | Status: DC
  Administered 2016-10-27 – 2016-10-28 (×3): 1 via ORAL

## 2016-10-26 MED ORDER — INSULIN GLARGINE 100 UNIT/ML ~~LOC~~ SOLN
8.0000 [IU] | Freq: Every day | SUBCUTANEOUS | Status: DC
  Administered 2016-10-26 – 2016-10-31 (×5): 8 [IU] via SUBCUTANEOUS
  Filled 2016-10-26 (×6): qty 0.08

## 2016-10-26 MED ORDER — POLYETHYLENE GLYCOL 3350 17 G PO PACK
17.0000 g | PACK | Freq: Every day | ORAL | Status: DC
  Administered 2016-10-27 – 2016-10-28 (×2): 17 g via ORAL
  Filled 2016-10-26 (×3): qty 1

## 2016-10-26 MED ORDER — LACTULOSE 10 GM/15ML PO SOLN
10.0000 g | Freq: Two times a day (BID) | ORAL | Status: DC
  Administered 2016-10-27 – 2016-10-31 (×4): 10 g via ORAL
  Filled 2016-10-26 (×6): qty 15

## 2016-10-26 NOTE — Care Management Important Message (Signed)
Important Message  Patient Details  Name: [name] MRN: [id number] Date of Birth: 13-Jul-1933   Medicare Important Message Given:  Yes    Kerin Salen 10/26/2016, 10:54 AMImportant Message  Patient Details  Name: [name] MRN: [id number] Date of Birth: 07/08/1933   Medicare Important Message Given:  Yes    Kerin Salen 10/26/2016, 10:54 AM

## 2016-10-26 NOTE — Care Management Important Message (Signed)
Important Message  Patient Details  Name: [name] MRN: [id number] Date of Birth: [birth year]   Medicare Important Message Given:  Yes    Kerin Salen 10/26/2016, 10:51 AMImportant Message  Patient Details  Name: [name] MRN: [id number] Date of Birth: 1933-07-02   Medicare Important Message Given:  Yes    Kerin Salen 10/26/2016, 10:51 AM

## 2016-10-26 NOTE — Progress Notes (Signed)
[name]   DOB:[birth year]   CH#:[id number]    Subjective: Her pain is better control.  Vitals are stable.  She is noted to have poor oral intake.  No bowel movement yet  Objective:  Vitals:   10/25/16 2146 10/26/16 0500  BP: (!) 143/48 (!) 149/53  Pulse: (!) 44 (!) 48  Resp: 16 16  Temp: 98.4 F (36.9 C) 97.9 F (36.6 C)     Intake/Output Summary (Last 24 hours) at 10/26/16 2878 Last data filed at 10/26/16 0440  Gross per 24 hour  Intake             1200 ml  Output             1700 ml  Net             -500 ml    GENERAL:alert, no distress and comfortable SKIN: She continues to have drainage on the right neck EYES: normal, Conjunctiva are pink and non-injected, sclera clear OROPHARYNX:no exudate, no erythema and lips, buccal mucosa, and tongue normal.  No mucositis or thrush NECK: Persistent large neck lymph node LUNGS: clear to auscultation and percussion with normal breathing effort HEART: regular rate & rhythm and no murmurs and no lower extremity edema ABDOMEN:abdomen soft, non-tender and normal bowel sounds Musculoskeletal:no cyanosis of digits and no clubbing  NEURO: alert & oriented x 3 with fluent speech, no focal motor/sensory deficits   Labs:  Lab Results  Component Value Date   WBC 55.9 (HH) 10/25/2016   HGB 11.0 (L) 10/25/2016   HCT 34.0 (L) 10/25/2016   MCV 84.4 10/25/2016   PLT 267 10/25/2016   NEUTROABS 52.0 (H) 10/25/2016    Lab Results  Component Value Date   NA 140 10/26/2016   K 3.7 10/26/2016   CL 88 (L) 10/26/2016   CO2 41 (H) 10/26/2016    Assessment & Plan:   Tongue cancer ([place]) The patient has complex past medical history including renal failure on hemodialysis (which has since resolved), severe substance abuse especially with alcoholism (has been abstinent)and background history of dementia. I have read a lot of the past medicalrecords, in collaboration with history of fromher daughter who appears to be the principal  caregiver. In essence, this patient has rapidly progressive disease in the neck with impeding airway compromise without further treatment. There is a concern about possible airway compromise with airway swelling with radiation treatment After extensive discussion at the ENT tumor board, the decision is made for her to undergo chemotherapy in the hospital first in an attempt to shrink down the tumor PICC line was placed on 10/22/2016 with first dose of reduced dose cisplatin Continue aggressive supportive care  Malignant hypercalcemia, improving This has improvedon IV fluid resuscitation, IV dexamethasone, nasal calcitonin and Lasix  Continue the same She received 1 dose of IV Zometa on 10/24/2016 We will reduce dose of dexamethasone  Severe hypertension, resolved Improving on antihypertensives I have discontinue her blood pressure medications  Imminent airway compromise with mild stridor and excessive airway secretion Due to disease progression I will start her on dexamethasone IV twice a day and monitor carefully ENT service had evaluated her imaging and felt that tracheostomy at this point is not possible Excessive airway secretion has improved.  Aspiration pneumonia Formal swallow evaluation, result available, risk of aspiration Repeat chest x-ray this morning on 10/23/2016 show evidence of pneumonia, likely due to aspiration pneumonia We will consult pharmacist and switch her antibiotic coverage to Zosyn Total treatment for 7  days Will also add continuous oxygen and Nebulizers  Leukocytosis with necrotic tumor with serosanguineous discharge/cellulitis Likely multifactorial due to pneumonia and steroids Continue antibiotic coverage andcontinue dry dressing changes  Severe hypokalemia, resolved  Severe constipation Will add Senokot along with MiraLAX Will add lactulose  Poor oral intake We will consult dietitian  Steroid induced hyperglycemia We will titrate  insulin  Severe cancer pain I will get her schedule liquid morphine and will reassess tomorrow Continue IV as needed morphine  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 After extensive review of prior evaluation by neurologist from [place], I felt that the patient indeed may have age related dementia but appears to be coping well with aggressive social support We will consult PT for formal assessment prior to discharge  CODE STATUS DNR  Goals of care, counseling/discussion The patient is aware shehas incurable disease and treatment is strictly palliative. We discussed importance of Advanced Directives and Living will. Her daughter appears to be the healthcare medical power of attorney I discussed goals of care with the patient's daughter and she agree with the plan of care as above  Discharge planning The patient had multiple reason to be admitted, namely malignant hypercalcemia, severe hypokalemia, leukocytosis with draining necrotic tumor and imminent airway compromise. The risk of not admitting her would be risk of death She will likely to be here the next 3-5 days, will be here for cycle 2 of treatment on Thursday We will consult social worker and care management for possible discharge after chemo on Friday she may benefit from skilled nursing facility after discharge   Heath Lark, MD 10/26/2016  8:38 AM

## 2016-10-26 NOTE — Progress Notes (Signed)
  Speech Language Pathology Treatment: Dysphagia  Patient Details Name: [name] MRN: [id number] DOB: [birth year] Today's Date: 10/26/2016 Time: 0086-7619 SLP Time Calculation (min) (ACUTE ONLY): 18 min  Assessment / Plan / Recommendation Clinical Impression  Note pt now has right lung dense opacities - concerning for aspiration.  This SLP documented suspicion for chronic aspiration of secretions on MBS - as her voice was and still is intermittently noted to be gurgly - with poor pt awareness.  Reviewed videofluoroscopy of pt's swallow test.  Trace laryngeal penetration of liquids cleared independently and pt did have mild residuals of solids in pharynx that she sensed and cleared with reflexive dry swallows.  Of note, pt does appear with decrease/slow clearance at proximal esophagus - which may also contribute to aspiration.  Pt denies esophageal dysphagia symptoms -and declines to participate in esophageal evaluation if recommended.  Dedicated esophagram may assist to elucidate dysphagia if MD desires.    Observed pt consuming applesauce and coffee - No indication of airway compromise and voice remained clear until several minutes after.  Question source of wet voice, however pt cued to awareness and throat clear effectively cleared.  Pt reports swallowing ability has improved and states on occasion she subtly clears her throat - denies aspiration.    HPI HPI: Pt is an 83 yo female adm to [facility] with h/o cancer of anterior 2/3 tongue s/p surgery 05/2016 and now has rapidly growing tumor in her left neck and jaw region for which she is scheduled to have radiation.   Massive neck/cervical lymphadenopathy 8.5 cm with invasion of IJ with tumor thrombus per MD note.   Pt with left supraglottic larynx/pharynx, right submandibular - node 4 cm.  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  Swallow evaluation ordered.   Pt denies  ever having xray swallow study done before.       SLP Plan  Continue with current plan of care       Recommendations  Diet recommendations: Dysphagia 3 (mechanical soft);Thin liquid Liquids provided via: Cup;Straw Medication Administration: Crushed with puree Supervision: Patient able to self feed Compensations: Slow rate;Small sips/bites;Follow solids with liquid;Other (Comment) (clear throat if voice is gurgly/wet)                Oral Care Recommendations: Oral care BID Follow up Recommendations:  (tbd) SLP Visit Diagnosis: Dysphagia, oropharyngeal phase (R13.12) Plan: Continue with current plan of care       [place], [place] [facility] SLP [phone]

## 2016-10-26 NOTE — Evaluation (Signed)
Physical Therapy Evaluation Patient Details Name: [name] MRN: [id number] DOB: 21-Sep-1933 Today's Date: 10/26/2016   History of Present Illness  83 yo female admitted with tongue cancer, rapidly growing tumor bil jaw regions, hypokalemia, leukocytosis. Hx of DM, COPD, neuropathy  Clinical Impression  On eval, pt required Min assist for mobility. She walked ~10 feet in room while holding on to the IV pole. Pt c/o lightheadedness/dizziness during session so deferred any further ambulation. O2 sats dropped to 78% on RA-replaced [place] O2-sats returned to 91% on [place] O2. Pt presents with general weakness, decreased activity tolerance, and impaired gait and balance. Recommend ST rehab at SNF if pt is agreeable. If pt returns home, recommend HHPT and 24 hour supervision/assist.     Follow Up Recommendations SNF (if pt is agreeable.);Supervision/Assistance - 24 hour  Equipment Recommendations  None recommended by PT    Recommendations for Other Services       Precautions / Restrictions Precautions Precautions: Fall Precaution Comments: monitior O2 sats Restrictions Weight Bearing Restrictions: No      Mobility  Bed Mobility Overal bed mobility: Modified Independent                Transfers Overall transfer level: Needs assistance   Transfers: Sit to/from Stand Sit to Stand: Min assist         General transfer comment: assist to stabilize. Pt is unsteady and she c/o lightheadedness/dizziness.   Ambulation/Gait Ambulation/Gait assistance: Min assist Ambulation Distance (Feet): 10 Feet (IV pole) Assistive device: 1 person hand held assist Gait Pattern/deviations: Step-through pattern;Decreased stride length     General Gait Details: Assist to stabilize. Pt c/o lightheadedness/dizziness so remained in room for safety. O2 sats dropped to 78% on RA.   Stairs            Wheelchair Mobility    Modified Rankin (Stroke Patients Only)       Balance Overall  balance assessment: Needs assistance           Standing balance-Leahy Scale: Poor                               Pertinent Vitals/Pain Pain Assessment: Faces Pain Location: mouth/jaw Pain Intervention(s): Monitored during session    Home Living Family/patient expects to be discharged to:: Unsure Living Arrangements: Alone Available Help at Discharge: Family;Personal care attendant (aide 2 hours) Type of Home: House       Home Layout: Multi-level (split level) Home Equipment: Walker - 2 wheels;Cane - single point      Prior Function Level of Independence: Independent with assistive device(s)         Comments: pt reported she was Mod Ind prior to admission     Hand Dominance        Extremity/Trunk Assessment   Upper Extremity Assessment Upper Extremity Assessment: Generalized weakness    Lower Extremity Assessment Lower Extremity Assessment: Generalized weakness    Cervical / Trunk Assessment Cervical / Trunk Assessment: Normal  Communication   Communication: Expressive difficulties (due to cancer)  Cognition Arousal/Alertness: Awake/alert Behavior During Therapy: WFL for tasks assessed/performed Overall Cognitive Status: Within Functional Limits for tasks assessed                                        General Comments      Exercises  Assessment/Plan    PT Assessment Patient needs continued PT services  PT Problem List Decreased strength;Decreased mobility;Decreased activity tolerance;Decreased balance;Decreased knowledge of use of DME;Pain;Cardiopulmonary status limiting activity       PT Treatment Interventions DME instruction;Gait training;Therapeutic activities;Therapeutic exercise;Patient/family education;Balance training;Functional mobility training    PT Goals (Current goals can be found in the Care Plan section)  Acute Rehab PT Goals Patient Stated Goal: regain PLOF/independence PT Goal Formulation:  With patient Time For Goal Achievement: 11/09/16 Potential to Achieve Goals: Good    Frequency Min 3X/week   Barriers to discharge        Co-evaluation               AM-PAC PT "6 Clicks" Daily Activity  Outcome Measure Difficulty turning over in bed (including adjusting bedclothes, sheets and blankets)?: None Difficulty moving from lying on back to sitting on the side of the bed? : None Difficulty sitting down on and standing up from a chair with arms (e.g., wheelchair, bedside commode, etc,.)?: A Little Help needed moving to and from a bed to chair (including a wheelchair)?: A Little Help needed walking in hospital room?: A Little Help needed climbing 3-5 steps with a railing? : A Little 6 Click Score: 20    End of Session   Activity Tolerance:  (Limited by lightheadedness/dizziness, dyspnea) Patient left: in chair;with call bell/phone within reach;with chair alarm set   PT Visit Diagnosis: Muscle weakness (generalized) (M62.81);Difficulty in walking, not elsewhere classified (R26.2)    Time: 5361-4431 PT Time Calculation (min) (ACUTE ONLY): 25 min   Charges:   PT Evaluation $PT Eval Low Complexity: 1 Procedure     PT G Codes:          Weston Anna, MPT Pager: [phone]

## 2016-10-26 NOTE — Progress Notes (Signed)
Initial Nutrition Assessment  DOCUMENTATION CODES:   Non-severe (moderate) malnutrition in context of chronic illness  INTERVENTION:   -Provide Boost Breeze po TID, each supplement provides 250 kcal and 9 grams of protein -Provide Unjury chicken Soup TID, Each serving provides 100kcal and 21g protein  -Placed patient on meal order with assist -RD to continue to monitor  NUTRITION DIAGNOSIS:   Malnutrition related to chronic illness as evidenced by moderate depletion of body fat, moderate depletions of muscle mass.  GOAL:   Patient will meet greater than or equal to 90% of their needs  MONITOR:   PO intake, Supplement acceptance, Labs, Weight trends, Skin, I & O's  REASON FOR ASSESSMENT:   Consult Assessment of nutrition requirement/status  ASSESSMENT:   83 yo female adm to [facility] with h/o cancer of anterior 2/3 tongue s/p surgery 05/2016 and now has rapidly growing tumor in her left neck and jaw region for which she is scheduled to have radiation.  Patient in room with no family at bedside. Pt hard to understand but RD was able to gather some information from patient. Pt with many complaints about the food and service she has received. States she has forgotten to order food at times. Will place pt on meal order with assist. Pt has been consuming 25-100% of her meals since admission.  SLP evaluated 5/7: pt with aspiration concerns. Pt declined esophageal evaluation. Recommend dysphagia 3 diet with thin liquids.  Pt is willing to try protein supplements, will order Unjury Chicken soup TID and Boost Breeze TID.  Per chart review, weight I stable. Nutrition-Focused physical exam completed. Findings are moderate fat depletion, moderate muscle depletion, and no edema.   Medications: IV Decadron every 12 hours, Senokot-S tablet BID  Labs reviewed: CBGs: 103-147  Diet Order:  DIET DYS 3 Room service appropriate? Yes with Assist; Fluid consistency: Thin  Skin:  Wound (see comment)  (non-pressure wound on face)  Last BM:  5/5  Height:   Ht Readings from Last 1 Encounters:  10/21/16 4\' 11"  (1.499 m)    Weight:   Wt Readings from Last 1 Encounters:  10/21/16 107 lb (48.5 kg)    Ideal Body Weight:  44.7 kg  BMI:  Body mass index is 21.61 kg/m.  Estimated Nutritional Needs:   Kcal:  1450-1650  Protein:  60-70g  Fluid:  1.5L/day  EDUCATION NEEDS:   No education needs identified at this time  Clayton Bibles, MS, RD, LDN Pager: [phone] After Hours Pager: [phone]

## 2016-10-26 NOTE — Progress Notes (Signed)
Pharmacy Antibiotic Follow-up Note  [name] is a 83 y.o. year-old female admitted on 10/20/2016.  The patient is currently on day 7 of total Abx, day 4 of Zosyn for aspiration PNA. Admit 5/1 for HyperCa, increasing lymphadenopathy, airway compromise.  Ancef from 5/1 for draining neck wound  5/4 change Abx to Zosyn for aspiration  Assessment/Plan: Zosyn 3.375g IV q8h (4 hour infusion).  MD note states 7 days abx treatment desired, 7 days total completed today 5/7.  Temp (24hrs), Avg:98.3 F (36.8 C), Min:97.9 F (36.6 C), Max:98.5 F (36.9 C)   Recent Labs Lab 10/21/16 0452 10/22/16 0830 10/23/16 0540 10/24/16 0500 10/25/16 0904  WBC 40.9* 55.6* 63.1* 66.9* 55.9*     Recent Labs Lab 10/23/16 0540 10/23/16 1300 10/24/16 0500 10/25/16 0904 10/26/16 0405  CREATININE 0.68 0.77 0.98 0.96 0.89   Estimated Creatinine Clearance: 34.4 mL/min (by C-G formula based on SCr of 0.89 mg/dL).    Allergies  Allergen Reactions  . Tuberculin Tests Other (See Comments)    UNSPECIFIED REACTION  Pt is allergic to PPD (The PPD skin test is a method used to diagnose silent (latent) tuberculosis (TB) infection. PPD stands for purified protein derivative.) Pt should receive chest x-rays instead.   Antimicrobials this admission: 5/1 Ancef >> 5/4 5/4 Zosyn >>   Microbiology results: No cultures  Thank you for allowing pharmacy to be a part of this patient's care.  Minda Ditto PharmD Pager [phone] 10/26/2016, 11:54 AM

## 2016-10-27 LAB — CBC WITH DIFFERENTIAL/PLATELET
BASOS ABS: 0 10*3/uL (ref 0.0–0.1)
Basophils Relative: 0 %
Eosinophils Absolute: 0.4 10*3/uL (ref 0.0–0.7)
Eosinophils Relative: 1 %
HEMATOCRIT: 35.9 % — AB (ref 36.0–46.0)
HEMOGLOBIN: 11.2 g/dL — AB (ref 12.0–15.0)
LYMPHS PCT: 4 %
Lymphs Abs: 1.5 10*3/uL (ref 0.7–4.0)
MCH: 26.6 pg (ref 26.0–34.0)
MCHC: 31.2 g/dL (ref 30.0–36.0)
MCV: 85.3 fL (ref 78.0–100.0)
MONOS PCT: 4 %
Monocytes Absolute: 1.5 10*3/uL — ABNORMAL HIGH (ref 0.1–1.0)
Neutro Abs: 34.4 10*3/uL — ABNORMAL HIGH (ref 1.7–7.7)
Neutrophils Relative %: 91 %
Platelets: 200 10*3/uL (ref 150–400)
RBC: 4.21 MIL/uL (ref 3.87–5.11)
RDW: 15.9 % — ABNORMAL HIGH (ref 11.5–15.5)
WBC MORPHOLOGY: INCREASED
WBC: 37.8 10*3/uL — AB (ref 4.0–10.5)

## 2016-10-27 LAB — GLUCOSE, CAPILLARY
GLUCOSE-CAPILLARY: 190 mg/dL — AB (ref 65–99)
GLUCOSE-CAPILLARY: 147 mg/dL — AB (ref 65–99)
Glucose-Capillary: 157 mg/dL — ABNORMAL HIGH (ref 65–99)

## 2016-10-27 LAB — COMPREHENSIVE METABOLIC PANEL
ALBUMIN: 3.4 g/dL — AB (ref 3.5–5.0)
ALT: 10 U/L — AB (ref 14–54)
ANION GAP: 11 (ref 5–15)
AST: 14 U/L — AB (ref 15–41)
Alkaline Phosphatase: 82 U/L (ref 38–126)
BILIRUBIN TOTAL: 0.3 mg/dL (ref 0.3–1.2)
BUN: 20 mg/dL (ref 6–20)
CO2: 38 mmol/L — ABNORMAL HIGH (ref 22–32)
Calcium: 8.9 mg/dL (ref 8.9–10.3)
Chloride: 88 mmol/L — ABNORMAL LOW (ref 101–111)
Creatinine, Ser: 0.65 mg/dL (ref 0.44–1.00)
GFR calc Af Amer: >60 mL/min (ref >60)
Glucose, Bld: 203 mg/dL — ABNORMAL HIGH (ref 65–99)
POTASSIUM: 3.5 mmol/L (ref 3.5–5.1)
SODIUM: 137 mmol/L (ref 135–145)
TOTAL PROTEIN: 6.7 g/dL (ref 6.5–8.1)

## 2016-10-27 LAB — URIC ACID: Uric Acid, Serum: 2.5 mg/dL (ref 2.3–6.6)

## 2016-10-27 MED ORDER — AMLODIPINE BESYLATE 5 MG PO TABS
5.0000 mg | ORAL_TABLET | Freq: Every day | ORAL | Status: DC
  Administered 2016-10-27: 5 mg via ORAL
  Filled 2016-10-27: qty 1

## 2016-10-27 NOTE — Progress Notes (Signed)
CW consulted for potential SNF placement.  Met with pt at bedside, she is alert and oriented x4. Declines SNF, states she lives alone and has sister nearby she can stay with if she needs additional assistance at home at [place]. Denies Home Health PT as well. States daughter has "someone who comes out to her home 3 x a week and they can help me."  CSW signing off.   Sharren Bridge, MSW, LCSW Clinical Social Work 10/27/2016 [phone]

## 2016-10-27 NOTE — Progress Notes (Signed)
[name]   DOB:1934/05/30   WN#:[id number]    Subjective: She denies pain. Has poor oral intake. Cannot remember last BM. No reported fever  Objective:  Vitals:   10/27/16 0645 10/27/16 0654  BP: (!) 195/59 (!) 160/62  Pulse: (!) 59   Resp: 18   Temp: 98 F (36.7 C)      Intake/Output Summary (Last 24 hours) at 10/27/16 0739 Last data filed at 10/27/16 0656  Gross per 24 hour  Intake              400 ml  Output             1100 ml  Net             -700 ml    GENERAL:alert, no distress and comfortable SKIN: serosanguinous discharge from neck EYES: normal, Conjunctiva are pink and non-injected, sclera clear OROPHARYNX:no exudate, no erythema and lips, buccal mucosa, and tongue normal.  Dry mucous membrane. NECK: Large bilateral lymphadenopathy LUNGS: Mild rhonchi over the right lung.  Normal breathing effort HEART: regular rate & rhythm and no murmurs and no lower extremity edema ABDOMEN:abdomen soft, non-tender and normal bowel sounds Musculoskeletal:no cyanosis of digits and no clubbing  NEURO: alert & oriented x 3 with fluent speech, no focal motor/sensory deficits   Labs:  Lab Results  Component Value Date   WBC 37.8 (H) 10/27/2016   HGB 11.2 (L) 10/27/2016   HCT 35.9 (L) 10/27/2016   MCV 85.3 10/27/2016   PLT 200 10/27/2016   NEUTROABS 34.4 (H) 10/27/2016    Lab Results  Component Value Date   NA 137 10/27/2016   K 3.5 10/27/2016   CL 88 (L) 10/27/2016   CO2 38 (H) 10/27/2016    Assessment & Plan:   Tongue cancer ([place]) The patient has complex past medical history including renal failure on hemodialysis (which has since resolved), severe substance abuse especially with alcoholism (has been abstinent)and background history of dementia. I have read a lot of the past medicalrecords, in collaboration with history of fromher daughter who appears to be the principal caregiver. In essence, this patient has rapidly progressive disease in the neck with  impeding airway compromise without further treatment. There is a concern about possible airway compromise with airway swelling with radiation treatment After extensive discussion at the ENT tumor board, the decision is made for her to undergo chemotherapy in the hospital first in an attempt to shrink down the tumor PICC line was placed on 10/22/2016 with first dose of reduced dose cisplatin Continue aggressive supportive care  Malignant hypercalcemia, resolved This has improvedon IV fluid resuscitation, IV dexamethasone, nasal calcitonin and Lasix  I have stop IV fluids due to recent fluid overload.  Lasix was also discontinued Continue calcitonin She received 1 dose of IV Zometa on 10/24/2016  Severe hypertension Improving on antihypertensives  Imminent airway compromise with mild stridor and excessive airway secretion Due to disease progression I will start her on dexamethasone IV twice a day and monitor carefully ENT service had evaluated her imaging and felt that tracheostomy at this point is not possible Excessive airway secretion has improved.  Aspiration pneumonia Formal swallow evaluation, result available, risk of aspiration Repeat chest x-ray on 10/23/2016 show evidence of pneumonia, likely due to aspiration pneumonia We will consult pharmacist and switch her antibiotic coverage to Zosyn Total treatment for 7 days Will also add continuous oxygen and Nebulizers  Leukocytosis with necrotic tumor with serosanguineous discharge/cellulitis Likely multifactorial due to pneumonia  and steroids Continue antibiotic coverage andcontinue dry dressing changes  Severe hypokalemia, resolved  Severe constipation Will add Senokot along with MiraLAX Will add lactulose  Poor oral intake We will consult dietitian  Steroid induced hyperglycemia We will titrate insulin  Severe cancer pain I will get her schedule liquid morphine and will reassess tomorrow Continue IV as needed  morphine  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 After extensive review of prior evaluation by neurologist from [place], I felt that the patient indeed may have age related dementia but appears to be coping well with aggressive social support We will consult PT for formal assessment prior to discharge  CODE STATUS DNR  Goals of care, counseling/discussion The patient is aware shehas incurable disease and treatment is strictly palliative. We discussed importance of Advanced Directives and Living will. Her daughter appears to be the healthcare medical power of attorney I discussed goals of care with the patient's daughter and she agree with the plan of care as above  Discharge planning The patient had multiple reason to be admitted, namely malignant hypercalcemia, severe hypokalemia, leukocytosis with draining necrotic tumor and imminent airway compromise. The risk of not admitting her would be risk of death She will likely to be here the next 3-5 days, will be here for cycle 2 of treatment on Thursday We will consult social worker and care management for possible discharge after chemo on Friday she may benefit from skilled nursing facility after discharge   Heath Lark, MD 10/27/2016  7:39 AM

## 2016-10-27 NOTE — Progress Notes (Signed)
This CM met with pt at bedside for discharge planning after pt declined SNF.  Pt states she does not want any home health services and she has someone that comes to help her at home and has a sister that can help as well.  Marney Doctor RN,BSN,NCM [phone]

## 2016-10-28 ENCOUNTER — Other Ambulatory Visit: Payer: Self-pay | Admitting: Hematology and Oncology

## 2016-10-28 LAB — COMPREHENSIVE METABOLIC PANEL
ALBUMIN: 3.2 g/dL — AB (ref 3.5–5.0)
ALT: 13 U/L — ABNORMAL LOW (ref 14–54)
AST: 20 U/L (ref 15–41)
Alkaline Phosphatase: 84 U/L (ref 38–126)
Anion gap: 11 (ref 5–15)
BILIRUBIN TOTAL: 0.5 mg/dL (ref 0.3–1.2)
BUN: 13 mg/dL (ref 6–20)
CO2: 34 mmol/L — ABNORMAL HIGH (ref 22–32)
Calcium: 8.5 mg/dL — ABNORMAL LOW (ref 8.9–10.3)
Chloride: 91 mmol/L — ABNORMAL LOW (ref 101–111)
Creatinine, Ser: 0.6 mg/dL (ref 0.44–1.00)
GFR calc Af Amer: >60 mL/min (ref >60)
GFR calc non Af Amer: >60 mL/min (ref >60)
GLUCOSE: 134 mg/dL — AB (ref 65–99)
POTASSIUM: 3.1 mmol/L — AB (ref 3.5–5.1)
Sodium: 136 mmol/L (ref 135–145)
TOTAL PROTEIN: 7.1 g/dL (ref 6.5–8.1)

## 2016-10-28 LAB — GLUCOSE, CAPILLARY
GLUCOSE-CAPILLARY: 111 mg/dL — AB (ref 65–99)
GLUCOSE-CAPILLARY: 190 mg/dL — AB (ref 65–99)
Glucose-Capillary: 140 mg/dL — ABNORMAL HIGH (ref 65–99)

## 2016-10-28 MED ORDER — AMLODIPINE BESYLATE 10 MG PO TABS
10.0000 mg | ORAL_TABLET | Freq: Every day | ORAL | Status: DC
  Administered 2016-10-28 – 2016-10-31 (×3): 10 mg via ORAL
  Filled 2016-10-28 (×3): qty 1

## 2016-10-28 MED ORDER — HYDRALAZINE HCL 10 MG PO TABS
10.0000 mg | ORAL_TABLET | Freq: Three times a day (TID) | ORAL | Status: DC
  Administered 2016-10-28 – 2016-10-31 (×8): 10 mg via ORAL
  Filled 2016-10-28 (×10): qty 1

## 2016-10-28 MED ORDER — LISINOPRIL 5 MG PO TABS: 5.0000 mg | ORAL_TABLET | Freq: Every day | ORAL | Status: DC

## 2016-10-28 MED ORDER — POTASSIUM CHLORIDE 10 MEQ/50ML IV SOLN
10.0000 meq | INTRAVENOUS | Status: AC
  Administered 2016-10-28 (×4): 10 meq via INTRAVENOUS
  Filled 2016-10-28 (×4): qty 50

## 2016-10-28 NOTE — Progress Notes (Signed)
Physical Therapy Treatment Patient Details Name: [name] MRN: [id number] DOB: 11/01/1933 Today's Date: 10/28/2016    History of Present Illness 83 yo female admitted with tongue cancer, rapidly growing tumor bil jaw regions, hypokalemia, leukocytosis. Hx of DM, COPD, neuropathy    PT Comments    Progressing with mobility. O2 sats were 95% on RA during session. Used RW for ambulation safety. Pt walked a short distance of ~60 feet.    Follow Up Recommendations  SNF (if pt will agree)     Equipment Recommendations  None recommended by PT    Recommendations for Other Services       Precautions / Restrictions Precautions Precautions: Fall Restrictions Weight Bearing Restrictions: No    Mobility  Bed Mobility               General bed mobility comments: pt sitting EOB  Transfers Overall transfer level: Needs assistance   Transfers: Sit to/from Stand Sit to Stand: Min guard         General transfer comment: close guard for safety. VCs safety  Ambulation/Gait Ambulation/Gait assistance: Min guard Ambulation Distance (Feet): 60 Feet Assistive device: Rolling walker (2 wheeled) Gait Pattern/deviations: Step-through pattern;Decreased stride length     General Gait Details: close guard for safety. O2 sats 95% on RA during ambulation. Pt c/o some lightheadedness.    Stairs            Wheelchair Mobility    Modified Rankin (Stroke Patients Only)       Balance                                            Cognition Arousal/Alertness: Awake/alert Behavior During Therapy: WFL for tasks assessed/performed Overall Cognitive Status: Within Functional Limits for tasks assessed                                        Exercises      General Comments        Pertinent Vitals/Pain Pain Assessment: Faces Faces Pain Scale: Hurts even more Pain Location: neck, shoulders Pain Intervention(s): Monitored during  session    Home Living                      Prior Function            PT Goals (current goals can now be found in the care plan section) Progress towards PT goals: Progressing toward goals    Frequency    Min 3X/week      PT Plan Current plan remains appropriate    Co-evaluation              AM-PAC PT "6 Clicks" Daily Activity  Outcome Measure  Difficulty turning over in bed (including adjusting bedclothes, sheets and blankets)?: None Difficulty moving from lying on back to sitting on the side of the bed? : None Difficulty sitting down on and standing up from a chair with arms (e.g., wheelchair, bedside commode, etc,.)?: A Little Help needed moving to and from a bed to chair (including a wheelchair)?: A Little Help needed walking in hospital room?: A Little Help needed climbing 3-5 steps with a railing? : A Little 6 Click Score: 20    End of Session Equipment Utilized During Treatment:  Gait belt Activity Tolerance: Patient tolerated treatment well Patient left: in bed;with call bell/phone within reach;with bed alarm set (sitting EOB)   PT Visit Diagnosis: Muscle weakness (generalized) (M62.81);Difficulty in walking, not elsewhere classified (R26.2)     Time: 7530-0511 PT Time Calculation (min) (ACUTE ONLY): 14 min  Charges:  $Gait Training: 8-22 mins                    G Codes:         Weston Anna, MPT Pager: [phone]

## 2016-10-28 NOTE — Progress Notes (Signed)
SLP to sign off at this time.  Please order follow up at SNF for dysphagia management especially with pt receiving cancer tx - chemo - that may exacerbate her dysphagia.  Thanks. Luanna Salk, [facility] SLP [phone]

## 2016-10-28 NOTE — Clinical Social Work Note (Signed)
Clinical Social Work Assessment  Patient Details  Name: [name] MRN: [id number] Date of Birth: 12-09-1933  Date of referral:  10/28/16               Reason for consult:  Facility Placement                Permission sought to share information with:  Guardian, Family Supports Permission granted to share information::  Yes, Verbal Permission Granted  Name::     Daughter Latina Craver [phone] (guardian)  Agency::     Relationship::     Contact Information:     Housing/Transportation Living arrangements for the past 2 months:  Single Family Home Source of Information:  Patient, Adult Children, Medical Team Patient Interpreter Needed:  None Criminal Activity/Legal Involvement Pertinent to Current Situation/Hospitalization:  No - Comment as needed Significant Relationships:  Adult Children, Siblings Lives with:  Self Do you feel safe going back to the place where you live?  Yes Need for family participation in patient care:  Yes (Comment) (daughter guardian )  Care giving concerns:  Pt from home where she lives alone. PT working with pt and recommends SNF placement. Pt had previously declined SNF to [place], however per physician pt lacks ability to make sound medical decisions at this time (see note). Daughter is legal guardian (paperwork in chart).  Daughter spoke at length with CSW re: her concerns about her mother returning home at [place]. States pt does have a private home health aide who comes 3x week to home, however "mom refuses for them to come in sometimes, so there are times we don't know if she's taking her medications." Pt's support system, primarily daughter and sister, live in [place] area and [place], therefore are not immediately available for assistance.  Daughter reports pt dx with dementia and while she is oriented, "she will forget to take her medicine or get confused about things." States pt went to rehab once before in 2014 and to an ALF in 2017, "did very well  there, progressed and came back home. It's been in these past few months that she declined. She let this cancer become worse." Daughter prefers SNF placement for pt, understanding that her mother is refusing. Daughter reports plans to visit with pt this evening and encourage her to agree to [place] rehab at [place].   Social Worker assessment / plan:  CSW consulted for potential SNF placement. Met with pt at bedside. Pt refuses SNF, however daughter is guardian and wants to pursue SNF at [place]. Per MD, pt is not able to make sound medical decisions at this time.  CSW completed FL2. Submitted referrals to area facilities.  Will follow up with daughter for bed offers.   Plan: SNF at [place]. Will continue following to assist.   Employment status:  Retired Nurse, adult PT Recommendations:  [place], [place] / Referral to community resources:  [place]  Patient/Family's Response to care:  Patient pleasant and appreciative of care but expresses she is upset and "wants to go home, no where else." Daughter appreciative of care.   Patient/Family's Understanding of and Emotional Response to Diagnosis, Current Treatment, and Prognosis:  Pt is alert and oriented x 4, however she seems to not be realistic about DC plan. States that "even if my daughter is legal guardian, she shouldn't be so I'm not going to do what she says."  Daughter demonstrates adequate understanding of care and practical  need to have pt agree to SNF as CSW proceeds with seeking placement.  Emotional Assessment Appearance:  Appears stated age Attitude/Demeanor/Rapport:   (appropriate, somewhat angry at times) Affect (typically observed):  Calm Orientation:  Oriented to Self, Oriented to Place, Oriented to  Time, Oriented to Situation Alcohol / Substance use:  Not Applicable (hx of etoh use, none current) Psych involvement (Current and /or in the community):  No  (Comment)  Discharge Needs  Concerns to be addressed:  Discharge Planning Concerns Readmission within the last 30 days:  No Current discharge risk:  None Barriers to Discharge:  Continued Medical Work up   [employer], LCSW 10/28/2016, 2:52 PM  [phone]

## 2016-10-28 NOTE — Clinical Social Work Placement (Signed)
   CLINICAL SOCIAL WORK PLACEMENT  NOTE  Date:  10/28/2016  Patient Details  Name: [name] MRN: [id number] Date of Birth: July 22, 1933  Clinical Social Work is seeking post-discharge placement for this patient at the [place] level of care (*CSW will initial, date and re-position this form in  chart as items are completed):  Yes   Patient/family provided with [place] Work Department's list of facilities offering this level of care within the geographic area requested by the patient (or if unable, by the patient's family).  Yes   Patient/family informed of their freedom to choose among providers that offer the needed level of care, that participate in Medicare, Medicaid or managed care program needed by the patient, have an available bed and are willing to accept the patient.  Yes   Patient/family informed of [facility]'s ownership interest in [facility] and [facility], as well as of the fact that they are under no obligation to receive care at these facilities.  PASRR submitted to EDS on       PASRR number received on       Existing PASRR number confirmed on 10/28/16     FL2 transmitted to all facilities in geographic area requested by pt/family on 10/28/16     FL2 transmitted to all facilities within larger geographic area on       Patient informed that his/her managed care company has contracts with or will negotiate with certain facilities, including the following:            Patient/family informed of bed offers received.  Patient chooses bed at       Physician recommends and patient chooses bed at      Patient to be transferred to   on  .  Patient to be transferred to facility by       Patient family notified on   of transfer.  Name of family member notified:        PHYSICIAN       Additional Comment:    _______________________________________________ Nila Nephew, LCSW 10/28/2016, 3:06 PM

## 2016-10-28 NOTE — NC FL2 (Signed)
[place] LEVEL OF CARE SCREENING TOOL     IDENTIFICATION  Patient Name: [name] Birthdate: 1933-09-05 Sex: female Admission Date (Current Location): 10/20/2016  [facility] and [place] Number:  Herbalist and Address:  [facility],  [place] [address] [place]      Provider Number: [id number]  Attending Physician Name and Address:  Heath Lark, MD  Relative Name and Phone Number:       Current Level of Care: Hospital Recommended Level of Care: [place] Prior Approval Number:    Date Approved/Denied:   PASRR Number: [id number]  Discharge Plan: SNF    Current Diagnoses: Patient Active Problem List   Diagnosis Date Noted  . Metastasis to lymph nodes ([employer]) 10/20/2016  . Hypercalcemia 10/20/2016  . 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 Neuropathy due to medical condition ([place]) 10/08/2016  . Cancer associated pain 10/08/2016  . Tobacco abuse 10/08/2016  . Cancer of anterior two-thirds of tongue ([place]) 05/13/2016  . Fluid retention 03/01/2013  . Aortic heart murmur on examination 03/01/2013  . Severe malnutrition ([place]) 02/08/2013  . Leukocytosis, unspecified 02/01/2013  . Hypomagnesemia 07/19/2012  . Hypocalcemia 07/18/2012  . Weakness 01/04/2012  . UTI (lower urinary tract infection) 01/04/2012  . Thrombocytopenia ([place]) 05/26/2011  . DM (diabetes mellitus) ([place]) 05/26/2011  . Hypertriglyceridemia 05/26/2011  . Upper GI bleed 05/26/2011  . Gastritis 05/25/2011  . Anemia 05/25/2011  . Alcohol induced fatty liver 05/25/2011  . Hypothyroid 05/25/2011  . Hypokalemia 05/25/2011    Orientation RESPIRATION BLADDER Height & Weight     Self, Time, Situation, Place  Normal Continent Weight: 107 lb (48.5 kg) Height:  4\' 11"  (149.9 cm)  BEHAVIORAL SYMPTOMS/MOOD NEUROLOGICAL BOWEL NUTRITION STATUS      Continent Diet (dysphasia 3, thin liquid, low sodium)  AMBULATORY STATUS COMMUNICATION  OF NEEDS Skin   Limited Assist Verbally Other (Comment)  Non-pressure wound face right Dressing type: ABD, gauze Dressing change frequency: twice a day and PRN                     Personal Care Assistance Level of Assistance  Bathing, Feeding, Dressing Bathing Assistance: Limited assistance Feeding assistance: Independent Dressing Assistance: Limited assistance     Functional Limitations Info  Sight, Hearing, Speech Sight Info: Adequate Hearing Info: Adequate Speech Info: Adequate    SPECIAL CARE FACTORS FREQUENCY  PT (By licensed PT), OT (By licensed OT), Speech therapy     PT Frequency: 5x OT Frequency: 5x     Speech Therapy Frequency: 3x      Contractures Contractures Info: Not present    Additional Factors Info  Code Status, Allergies Code Status Info: DNR Allergies Info: Tuberculin tests           Current Medications (10/28/2016):  This is the current hospital active medication list Current Facility-Administered Medications  Medication Dose Route Frequency Provider Last Rate Last Dose  . acetaminophen (TYLENOL) tablet 650 mg  650 mg Oral Q4H PRN Heath Lark, MD   650 mg at 10/20/16 1757  . albuterol (PROVENTIL) (2.5 MG/3ML) 0.083% nebulizer solution 2.5 mg  2.5 mg Nebulization Q6H PRN Jaslynn Thome, MD      . alteplase (CATHFLO ACTIVASE) injection 2 mg  2 mg Intracatheter Once PRN Alvy Bimler, Hosanna Betley, MD      . alum & mag hydroxide-simeth (MAALOX/MYLANTA) 200-200-20 MG/5ML suspension 60 mL  60 mL Oral Q4H PRN Heath Lark, MD      .  amLODipine (NORVASC) tablet 10 mg  10 mg Oral Daily Alvy Bimler, Belle Charlie, MD   10 mg at 10/28/16 1050  . calcitonin (salmon) (MIACALCIN/FORTICAL) nasal spray 1 spray  1 spray Alternating Nares Daily Alvy Bimler, Almando Brawley, MD   1 spray at 10/28/16 1050  . dexamethasone (DECADRON) injection 2 mg  2 mg Intravenous Q12H Alvy Bimler, Philipe Laswell, MD   2 mg at 10/28/16 1051  . enoxaparin (LOVENOX) injection 40 mg  40 mg Subcutaneous Q24H Alvy Bimler, Evonda Enge, MD   40 mg at 10/27/16 2158   . feeding supplement (BOOST / RESOURCE BREEZE) liquid 1 Container  1 Container Oral TID BM Heath Lark, MD   1 Container at 10/28/16 1000  . guaiFENesin-dextromethorphan (ROBITUSSIN DM) 100-10 MG/5ML syrup 10 mL  10 mL Oral Q4H PRN Alvy Bimler, Alexsander Cavins, MD      . heparin lock flush 100 unit/mL  500 Units Intracatheter Once PRN Alvy Bimler, Emmilyn Crooke, MD      . heparin lock flush 100 unit/mL  250 Units Intracatheter Once PRN Alvy Bimler, Darlin Stenseth, MD      . hydrALAZINE (APRESOLINE) tablet 10 mg  10 mg Oral Q8H PRN Alvy Bimler, Anica Alcaraz, MD      . hydrALAZINE (APRESOLINE) tablet 10 mg  10 mg Oral Q8H Gatlin Kittell, MD      . hydrocortisone (ANUSOL-HC) 2.5 % rectal cream 1 application  1 application Rectal BID PRN Annalucia Laino, MD      . insulin aspart (novoLOG) injection 0-24 Units  0-24 Units Subcutaneous TID WC Heath Lark, MD   2 Units at 10/28/16 0802  . insulin glargine (LANTUS) injection 8 Units  8 Units Subcutaneous Daily Heath Lark, MD   8 Units at 10/28/16 1050  . ipratropium-albuterol (DUONEB) 0.5-2.5 (3) MG/3ML nebulizer solution 3 mL  3 mL Nebulization TID Heath Lark, MD   3 mL at 10/27/16 2001  . lactulose (CHRONULAC) 10 GM/15ML solution 10 g  10 g Oral BID Heath Lark, MD   10 g at 10/27/16 2158  . latanoprost (XALATAN) 0.005 % ophthalmic solution 1 drop  1 drop Both Eyes QHS Heath Lark, MD   1 drop at 10/27/16 2159  . levothyroxine (SYNTHROID, LEVOTHROID) tablet 88 mcg  88 mcg Oral QAC breakfast Heath Lark, MD   88 mcg at 10/28/16 0512  . lidocaine (XYLOCAINE) 1 % (with pres) injection    PRN Allred, Darrell K, PA-C   5 mL at 10/22/16 1043  . MEDLINE mouth rinse  15 mL Mouth Rinse BID Alvy Bimler, Damarious Holtsclaw, MD   15 mL at 10/28/16 1051  . morphine 4 MG/ML injection 1 mg  1 mg Intravenous Q2H PRN Heath Lark, MD   1 mg at 10/28/16 0802  . morphine CONCENTRATE 10 MG/0.5ML oral solution 10 mg  10 mg Oral TID Heath Lark, MD   10 mg at 10/28/16 1050  . ondansetron (ZOFRAN) tablet 4-8 mg  4-8 mg Oral Q8H PRN Alvy Bimler, Javius Sylla, MD       Or  .  ondansetron (ZOFRAN-ODT) disintegrating tablet 4-8 mg  4-8 mg Oral Q8H PRN Alvy Bimler, Jezreel Sisk, MD       Or  . ondansetron (ZOFRAN) injection 4 mg  4 mg Intravenous Q8H PRN Elowyn Raupp, MD       Or  . ondansetron (ZOFRAN) 8 mg in sodium chloride 0.9 % 50 mL IVPB  8 mg Intravenous Q8H PRN Shauniece Kwan, MD      . piperacillin-tazobactam (ZOSYN) IVPB 3.375 g  3.375 g Intravenous Q8H Green, Terri L, RPH   Stopped  at 10/28/16 0748  . polyethylene glycol (MIRALAX / GLYCOLAX) packet 17 g  17 g Oral Daily Alvy Bimler, Redina Zeller, MD   17 g at 10/28/16 1050  . protein supplement (UNJURY CHICKEN SOUP) powder 8 oz  8 oz Oral TID Heath Lark, MD   8 oz at 10/27/16 1004  . senna-docusate (Senokot-S) tablet 1 tablet  1 tablet Oral QHS PRN Alvy Bimler, Markcus Lazenby, MD      . senna-docusate (Senokot-S) tablet 1 tablet  1 tablet Oral BID Heath Lark, MD   1 tablet at 10/28/16 1050  . sodium chloride flush (NS) 0.9 % injection 10 mL  10 mL Intracatheter PRN Olga Seyler, MD      . sodium chloride flush (NS) 0.9 % injection 3 mL  3 mL Intravenous PRN Heath Lark, MD         Discharge Medications: Please see discharge summary for a list of discharge medications.  Relevant Imaging Results:  Relevant Lab Results:   Additional Information SS# [id number]. Pt has oncology appt 11/10/16  Nila Nephew, LCSW

## 2016-10-28 NOTE — Progress Notes (Signed)
[name]   DOB:06/17/1934   QB#:[id number]    Subjective: The patient is upset.  She does not want to go to skilled nursing facility.  She does not recognize how she ill she is.  She says she felt fine.  She told a Education officer, museum she have excellent home support from her sister and her daughter, which is not true. When I asked her about her oral intake, her oral intake remained poor but she blamed on hospital food.  There is no reported fever.  Oxygenation remained excellent. She has minimum bowel movement since aggressive laxatives.  She did not report any nausea.  She continues to have drainage through the neck wound  Objective:  Vitals:   10/27/16 2213 10/27/16 2217  BP:  (!) 157/59  Pulse: 82   Resp: 20   Temp: 97.7 F (36.5 C)      Intake/Output Summary (Last 24 hours) at 10/28/16 0845 Last data filed at 10/28/16 0348  Gross per 24 hour  Intake              270 ml  Output              650 ml  Net             -380 ml    GENERAL:alert, no distress and comfortable SKIN: She continues to have serosanguineous discharge from the neck EYES: normal, Conjunctiva are pink and non-injected, sclera clear OROPHARYNX:no exudate, no erythema and lips, buccal mucosa, and tongue normal  NECK: supple, thyroid normal size, non-tender, without nodularity LYMPH: She has large bilateral neck lymphadenopathy LUNGS: clear to auscultation and percussion with normal breathing effort HEART: regular rate & rhythm and no murmurs and no lower extremity edema ABDOMEN:abdomen soft, non-tender and normal bowel sounds Musculoskeletal:no cyanosis of digits and no clubbing  NEURO: alert & oriented x 3 with fluent speech, no focal motor/sensory deficits   Labs:  Lab Results  Component Value Date   WBC 37.8 (H) 10/27/2016   HGB 11.2 (L) 10/27/2016   HCT 35.9 (L) 10/27/2016   MCV 85.3 10/27/2016   PLT 200 10/27/2016   NEUTROABS 34.4 (H) 10/27/2016    Lab Results  Component Value Date   NA 136  10/28/2016   K 3.1 (L) 10/28/2016   CL 91 (L) 10/28/2016   CO2 34 (H) 10/28/2016    Assessment & Plan:   Tongue cancer ([place]) The patient has complex past medical history including renal failure on hemodialysis (which has since resolved), severe substance abuse especially with alcoholism (has been abstinent)and background history of dementia. I have read a lot of the past medicalrecords, in collaboration with history of fromher daughter who appears to be the principal caregiver. In essence, this patient has rapidly progressive disease in the neck with impeding airway compromise without further treatment. There is a concern about possible airway compromise with airway swelling with radiation treatment After extensive discussion at the ENT tumor board, the decision is made for her to undergo chemotherapy in the hospital first in an attempt to shrink down the tumor PICC line was placed on 10/22/2016 with first dose of reduced dose cisplatin Continue aggressive supportive care I plan to give her second dose of cisplatin tomorrow.  Pharmacist was notified.  Orders are signed.  Malignant hypercalcemia, resolved This has improvedon IV fluid resuscitation, IV dexamethasone, nasal calcitonin and Lasix  I have stop IV fluids due to recent fluid overload.  Lasix was also discontinued Continue calcitonin She received 1  dose of IV Zometa on 10/24/2016 Continue low-dose dexamethasone at 2 mg twice a day  Severe hypertension Improving on antihypertensives I will increase the dose of amlodipine and add hydralazine schedule. Plan to avoid atenolol due to recent bradycardia and avoid lisinopril due to risk of kidney injury  Imminent airway compromise with mild stridor and excessive airway secretion Due to disease progression I will start her on dexamethasone IV twice a day and monitor carefully ENT service had evaluated her imaging and felt that tracheostomy at this point is not possible Excessive  airway secretion has improved.  Aspiration pneumonia Formal swallow evaluation, result available, risk of aspiration Repeat chest x-ray on 10/23/2016 show evidence of pneumonia, likely due to aspiration pneumonia We will consult pharmacist and switch her antibiotic coverage to Zosyn Total treatment for 7 days Will also add continuous oxygen and Nebulizers  Leukocytosis with necrotic tumor with serosanguineous discharge/cellulitis Likely multifactorial due to pneumonia and steroids Continue antibiotic coverage andcontinue dry dressing changes  Severe hypokalemia, intermittent Could be due to potassium loss from chemotherapy of Zosyn We will replace intravenously  Severe constipation Will add Senokot along with MiraLAX Will add lactulose  Poor oral intake We will consult dietitian  Steroid induced hyperglycemia We will titrate insulin  Severe cancer pain I will get her schedule liquid morphine and will reassess tomorrow Continue IV as needed morphine  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 After extensive review of prior evaluation by neurologist from [place], I felt that the patient indeed may have age related dementia but appears to be coping well with aggressive social support Physical therapist felt that she may benefit from skilled nursing facility I spoke with the daughter and she agreed The patient is not in the state of mind when she can make medical decision  CODE STATUS DNR  Goals of care, counseling/discussion The patient is aware shehas incurable disease and treatment is strictly palliative. We discussed importance of Advanced Directives and Living will. Her daughter appears to be the healthcare medical power of attorney I discussed goals of care with the patient's daughter and she agree with the plan of care as  above  Discharge planning The patient had multiple reason to be admitted, namely malignant hypercalcemia, severe hypokalemia, leukocytosis with draining necrotic tumor and imminent airway compromise. The risk of not admitting her would be risk of death She will likely to be here the next 3-5 days, will be here for cycle 2 of treatment on Thursday We will consult social worker and care management for skilled nursing facility placement Her daughter disagree with the assessment from social worker and I have notified nursing staff to inform social worker to contact her daughter directly related to placement issue    Heath Lark, MD 10/28/2016  8:45 AM

## 2016-10-29 ENCOUNTER — Other Ambulatory Visit: Payer: Self-pay | Admitting: Hematology and Oncology

## 2016-10-29 LAB — CBC WITH DIFFERENTIAL/PLATELET
BASOS PCT: 0 %
Basophils Absolute: 0 10*3/uL (ref 0.0–0.1)
EOS ABS: 0 10*3/uL (ref 0.0–0.7)
Eosinophils Relative: 0 %
HCT: 36.9 % (ref 36.0–46.0)
Hemoglobin: 11.8 g/dL — ABNORMAL LOW (ref 12.0–15.0)
LYMPHS ABS: 2 10*3/uL (ref 0.7–4.0)
Lymphocytes Relative: 3 %
MCH: 26.9 pg (ref 26.0–34.0)
MCHC: 32 g/dL (ref 30.0–36.0)
MCV: 84.2 fL (ref 78.0–100.0)
MONO ABS: 1.3 10*3/uL — AB (ref 0.1–1.0)
Monocytes Relative: 2 %
NEUTROS PCT: 95 %
Neutro Abs: 61.9 10*3/uL — ABNORMAL HIGH (ref 1.7–7.7)
PLATELETS: 256 10*3/uL (ref 150–400)
RBC: 4.38 MIL/uL (ref 3.87–5.11)
RDW: 16.1 % — AB (ref 11.5–15.5)
WBC MORPHOLOGY: INCREASED
WBC: 65.2 10*3/uL — AB (ref 4.0–10.5)

## 2016-10-29 LAB — COMPREHENSIVE METABOLIC PANEL
ALK PHOS: 88 U/L (ref 38–126)
ALT: 14 U/L (ref 14–54)
ANION GAP: 12 (ref 5–15)
AST: 22 U/L (ref 15–41)
Albumin: 3.4 g/dL — ABNORMAL LOW (ref 3.5–5.0)
BILIRUBIN TOTAL: 0.5 mg/dL (ref 0.3–1.2)
BUN: 22 mg/dL — ABNORMAL HIGH (ref 6–20)
CALCIUM: 9.1 mg/dL (ref 8.9–10.3)
CO2: 31 mmol/L (ref 22–32)
Chloride: 95 mmol/L — ABNORMAL LOW (ref 101–111)
Creatinine, Ser: 0.8 mg/dL (ref 0.44–1.00)
GFR calc non Af Amer: >60 mL/min (ref >60)
GLUCOSE: 182 mg/dL — AB (ref 65–99)
Potassium: 4 mmol/L (ref 3.5–5.1)
Sodium: 138 mmol/L (ref 135–145)
TOTAL PROTEIN: 7.2 g/dL (ref 6.5–8.1)

## 2016-10-29 LAB — GLUCOSE, CAPILLARY
GLUCOSE-CAPILLARY: 228 mg/dL — AB (ref 65–99)
Glucose-Capillary: 190 mg/dL — ABNORMAL HIGH (ref 65–99)
Glucose-Capillary: 205 mg/dL — ABNORMAL HIGH (ref 65–99)

## 2016-10-29 LAB — MAGNESIUM: Magnesium: 2 mg/dL (ref 1.7–2.4)

## 2016-10-29 MED ORDER — SODIUM CHLORIDE 0.9 % IV SOLN
30.0000 mg/m2 | Freq: Once | INTRAVENOUS | Status: AC
  Administered 2016-10-29: 43 mg via INTRAVENOUS
  Filled 2016-10-29: qty 43

## 2016-10-29 MED ORDER — HEPARIN SOD (PORK) LOCK FLUSH 100 UNIT/ML IV SOLN: 500.0000 [IU] | Freq: Once | INTRAVENOUS | Status: DC | PRN

## 2016-10-29 MED ORDER — SODIUM CHLORIDE 0.9 % IV SOLN: Freq: Once | INTRAVENOUS | Status: DC

## 2016-10-29 MED ORDER — PALONOSETRON HCL INJECTION 0.25 MG/5ML
0.2500 mg | Freq: Once | INTRAVENOUS | Status: AC
  Administered 2016-10-29: 0.25 mg via INTRAVENOUS
  Filled 2016-10-29: qty 5

## 2016-10-29 MED ORDER — ALTEPLASE 2 MG IJ SOLR
2.0000 mg | Freq: Once | INTRAMUSCULAR | Status: DC | PRN
  Filled 2016-10-29: qty 2

## 2016-10-29 MED ORDER — SODIUM CHLORIDE 0.9% FLUSH: 10.0000 mL | INTRAVENOUS | Status: DC | PRN

## 2016-10-29 MED ORDER — POTASSIUM CHLORIDE 2 MEQ/ML IV SOLN
Freq: Once | INTRAVENOUS | Status: AC
  Administered 2016-10-29: 08:00:00 via INTRAVENOUS
  Filled 2016-10-29: qty 10

## 2016-10-29 MED ORDER — FOSAPREPITANT DIMEGLUMINE INJECTION 150 MG
Freq: Once | INTRAVENOUS | Status: AC
  Administered 2016-10-29: 11:00:00 via INTRAVENOUS
  Filled 2016-10-29: qty 5

## 2016-10-29 MED ORDER — METHYLNALTREXONE BROMIDE 12 MG/0.6ML ~~LOC~~ SOLN
12.0000 mg | Freq: Once | SUBCUTANEOUS | Status: DC
  Filled 2016-10-29: qty 0.6

## 2016-10-29 MED ORDER — IPRATROPIUM-ALBUTEROL 0.5-2.5 (3) MG/3ML IN SOLN: 3.0000 mL | Freq: Two times a day (BID) | RESPIRATORY_TRACT | Status: DC

## 2016-10-29 MED ORDER — HEPARIN SOD (PORK) LOCK FLUSH 100 UNIT/ML IV SOLN: 250.0000 [IU] | Freq: Once | INTRAVENOUS | Status: DC | PRN

## 2016-10-29 MED ORDER — SODIUM CHLORIDE 0.9% FLUSH: 3.0000 mL | INTRAVENOUS | Status: DC | PRN

## 2016-10-29 NOTE — Progress Notes (Signed)
Pharmacy Antibiotic Follow-up Note  [name] is a 83 y.o. year-old female admitted on 10/20/2016.  The patient is currently on day 7 of total Abx, day 4 of Zosyn for aspiration PNA. Admit 5/1 for HyperCa, increasing lymphadenopathy, airway compromise.  Ancef from 5/1 for draining neck wound  5/4 change Abx to Zosyn for aspiration  Assessment/Plan: Zosyn 3.375g IV q8h (4 hour infusion).  MD note states 7 days abx treatment desired, 7 days total Zosyn last dose 0400 on 5/11, no stop time on abx order as yet  Temp (24hrs), Avg:98.4 F (36.9 C), Min:98.1 F (36.7 C), Max:98.7 F (37.1 C)   Recent Labs Lab 10/23/16 0540 10/24/16 0500 10/25/16 0904 10/27/16 0631 10/29/16 0537  WBC 63.1* 66.9* 55.9* 37.8* 65.2*     Recent Labs Lab 10/25/16 0904 10/26/16 0405 10/27/16 0631 10/28/16 0509 10/29/16 0537  CREATININE 0.96 0.89 0.65 0.60 0.80   Estimated Creatinine Clearance: 38.3 mL/min (by C-G formula based on SCr of 0.8 mg/dL).    Allergies  Allergen Reactions  . Tuberculin Tests Other (See Comments)    UNSPECIFIED REACTION  Pt is allergic to PPD (The PPD skin test is a method used to diagnose silent (latent) tuberculosis (TB) infection. PPD stands for purified protein derivative.) Pt should receive chest x-rays instead.   Antimicrobials this admission: 5/1 Ancef >> 5/4 5/4 Zosyn >>   Microbiology results: No cultures  Thank you for allowing pharmacy to be a part of this patient's care.  Minda Ditto PharmD Pager [phone] 10/29/2016, 11:19 AM

## 2016-10-29 NOTE — Progress Notes (Signed)
PT Cancellation Note  Patient Details Name: [name] MRN: [id number] DOB: 06/17/1934   Cancelled Treatment:     Pt not able to participate due to CHEMO running and need for RN to monitor.  Will try again another day.   Rica Koyanagi  PTA [facility]  Acute  Rehab Pager      [phone]

## 2016-10-29 NOTE — Progress Notes (Signed)
Chemo dosages and dilutions verified by 2 RNs.  

## 2016-10-29 NOTE — Progress Notes (Signed)
[name]   DOB:Nov 27, 1933   JM#:[id number]    Subjective: Her pain is better control.  She continues to have chronic drainage from her wound.  Denies recent nausea or vomiting.  She is to have no bowel movement for almost 1 week.  Her vital signs are stable.  Objective:  Vitals:   10/28/16 2116 10/29/16 0535  BP: 130/66 (!) 111/53  Pulse: 99 93  Resp: 16 16  Temp: 98.7 F (37.1 C) 98.1 F (36.7 C)     Intake/Output Summary (Last 24 hours) at 10/29/16 0804 Last data filed at 10/29/16 0600  Gross per 24 hour  Intake              470 ml  Output              350 ml  Net              120 ml    GENERAL:alert, no distress and comfortable SKIN: skin color, texture, turgor are normal, no rashes or significant lesions EYES: normal, Conjunctiva are pink and non-injected, sclera clear OROPHARYNX: She is able to move her tongue well.  No oral thrush.  Dry mucous membrane NECK: supple, thyroid normal size, non-tender, without nodularity LYMPH:  She has bilateral neck lymphadenopathy.  The one on the right side is still draining serosanguineous fluid  LUNGS: clear to auscultation and percussion with normal breathing effort HEART: regular rate & rhythm and no murmurs and no lower extremity edema ABDOMEN:abdomen soft, non-tender and normal bowel sounds Musculoskeletal:no cyanosis of digits and no clubbing  NEURO: alert & oriented x 3 with fluent speech, no focal motor/sensory deficits   Labs:  Lab Results  Component Value Date   WBC 65.2 (HH) 10/29/2016   HGB 11.8 (L) 10/29/2016   HCT 36.9 10/29/2016   MCV 84.2 10/29/2016   PLT 256 10/29/2016   NEUTROABS 61.9 (H) 10/29/2016    Lab Results  Component Value Date   NA 138 10/29/2016   K 4.0 10/29/2016   CL 95 (L) 10/29/2016   CO2 31 10/29/2016    Assessment & Plan:   Tongue cancer ([place]) The patient has complex past medical history including renal failure on hemodialysis (which has since resolved), severe substance abuse  especially with alcoholism (has been abstinent)and background history of dementia. I have read a lot of the past medicalrecords, in collaboration with history of fromher daughter who appears to be the principal caregiver. In essence, this patient has rapidly progressive disease in the neck with impeding airway compromise without further treatment. There is a concern about possible airway compromise with airway swelling with radiation treatment After extensive discussion at the ENT tumor board, the decision is made for her to undergo chemotherapy in the hospital first in an attempt to shrink down the tumor PICC line was placed on 10/22/2016 with first dose of reduced dose cisplatin Continue aggressive supportive care I plan to give her second dose of cisplatin today.  Pharmacist was notified.  Orders are signed.  Malignant hypercalcemia, resolved This has improvedon IV fluid resuscitation, IV dexamethasone, nasal calcitonin and Lasix  I have stop IV fluids due to recent fluid overload. Lasix was also discontinued I will stop dexamethasone and calcitonin She received 1 dose of IV Zometa on 10/24/2016  Severe hypertension, improved Improving on antihypertensives I will increase the dose of amlodipine and add hydralazine scheduled. Plan to avoid atenolol due to recent bradycardia and avoid lisinopril due to risk of kidney injury  Imminent  airway compromise with mild stridor and excessive airway secretion, resolved Due to disease progression, improved since chemo and dexamethasone ENT service had evaluated her imaging and felt that tracheostomy at this point is not possible Excessive airway secretion has improved.  Aspiration pneumonia Formal swallow evaluation, result available, risk of aspiration Repeat chest x-ray on 10/23/2016 show evidence of pneumonia, likely due to aspiration pneumonia We will consult pharmacist and switch her antibiotic coverage to Zosyn Total treatment for 7  days Will also add continuous oxygen and Nebulizers  Leukocytosis with necrotic tumor with serosanguineous discharge/cellulitis Likely multifactorial due to pneumonia and steroids Continue antibiotic coverage andcontinue dry dressing changes  Severe hypokalemia, intermittent Could be due to potassium loss from chemotherapy of Zosyn We will replace intravenously  Severe constipation Will add Senokot along with MiraLAX Will add lactulose Will add Relistor  Poor oral intake We will consult dietitian  Steroid induced hyperglycemia We will titrate insulin  Severe cancer pain, resolved I will stop scheduled liquid morphine and will reassess tomorrow Continue IV as needed morphine  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 After extensive review of prior evaluation by neurologist from [place], I felt that the patient indeed may have age related dementia but appears to be coping well with aggressive social support Physical therapist felt that she may benefit from skilled nursing facility I spoke with the daughter and she agreed The patient is not in the state of mind when she can make medical decision  CODE STATUS DNR  Goals of care, counseling/discussion The patient is aware shehas incurable disease and treatment is strictly palliative. We discussed importance of Advanced Directives and Living will. Her daughter appears to be the healthcare medical power of attorney I discussed goals of care with the patient's daughter and she agree with the plan of care as above  Discharge planning The patient had multiple reason to be admitted, namely malignant hypercalcemia, severe hypokalemia, leukocytosis with draining necrotic tumor and imminent airway compromise. The risk of not admitting her would be risk of death She will likely to be here  the next 3-5 days, pending SNF placement We will consult social worker and care management for skilled nursing facility placement Her daughter disagree with the assessment from social worker and I have notified nursing staff to inform social worker to contact her daughter directly related to placement issue    Heath Lark, MD 10/29/2016  8:04 AM

## 2016-10-29 NOTE — Progress Notes (Signed)
CSW following for disposition/ DC planning. Spoke with daughter/guardian Seth Bake, chooses [facility] for SNF. Selected [facility] in HUB and informed Janie in admissions.  Spoke with daughter re: importance of family being present at DC due to pt's reluctance/resistance to SNF at DC. Daughter agrees and is making plans to be present at DC to assist in transition.  Will continue following and assist with DC planning.  Sharren Bridge, MSW, LCSW Clinical Social Work 10/29/2016 [phone]

## 2016-10-30 ENCOUNTER — Encounter: Payer: Self-pay | Admitting: *Deleted

## 2016-10-30 LAB — GLUCOSE, CAPILLARY
GLUCOSE-CAPILLARY: 200 mg/dL — AB (ref 65–99)
GLUCOSE-CAPILLARY: 155 mg/dL — AB (ref 65–99)
Glucose-Capillary: 221 mg/dL — ABNORMAL HIGH (ref 65–99)

## 2016-10-30 LAB — COMPREHENSIVE METABOLIC PANEL
ALT: 11 U/L — ABNORMAL LOW (ref 14–54)
AST: 15 U/L (ref 15–41)
Albumin: 3.3 g/dL — ABNORMAL LOW (ref 3.5–5.0)
Alkaline Phosphatase: 78 U/L (ref 38–126)
Anion gap: 8 (ref 5–15)
BUN: 19 mg/dL (ref 6–20)
CHLORIDE: 100 mmol/L — AB (ref 101–111)
CO2: 29 mmol/L (ref 22–32)
Calcium: 8.4 mg/dL — ABNORMAL LOW (ref 8.9–10.3)
Creatinine, Ser: 0.64 mg/dL (ref 0.44–1.00)
Glucose, Bld: 137 mg/dL — ABNORMAL HIGH (ref 65–99)
Potassium: 3.8 mmol/L (ref 3.5–5.1)
SODIUM: 137 mmol/L (ref 135–145)
Total Bilirubin: 0.4 mg/dL (ref 0.3–1.2)
Total Protein: 6.5 g/dL (ref 6.5–8.1)

## 2016-10-30 LAB — CBC WITH DIFFERENTIAL/PLATELET
BASOS PCT: 0 %
Basophils Absolute: 0 10*3/uL (ref 0.0–0.1)
EOS PCT: 0 %
Eosinophils Absolute: 0 10*3/uL (ref 0.0–0.7)
HEMATOCRIT: 33.7 % — AB (ref 36.0–46.0)
HEMOGLOBIN: 10.6 g/dL — AB (ref 12.0–15.0)
LYMPHS PCT: 2 %
Lymphs Abs: 1.3 10*3/uL (ref 0.7–4.0)
MCH: 26.5 pg (ref 26.0–34.0)
MCHC: 31.5 g/dL (ref 30.0–36.0)
MCV: 84.3 fL (ref 78.0–100.0)
MONO ABS: 1.3 10*3/uL — AB (ref 0.1–1.0)
MONOS PCT: 2 %
NEUTROS PCT: 96 %
Neutro Abs: 63.4 10*3/uL — ABNORMAL HIGH (ref 1.7–7.7)
Platelets: 212 10*3/uL (ref 150–400)
RBC: 4 MIL/uL (ref 3.87–5.11)
RDW: 15.9 % — ABNORMAL HIGH (ref 11.5–15.5)
WBC MORPHOLOGY: INCREASED
WBC: 66 10*3/uL — AB (ref 4.0–10.5)

## 2016-10-30 MED ORDER — HYDRALAZINE HCL 10 MG PO TABS: 10.0000 mg | ORAL_TABLET | Freq: Three times a day (TID) | ORAL | 0 refills | Status: DC

## 2016-10-30 MED ORDER — FENTANYL 12 MCG/HR TD PT72: 12.5000 ug | MEDICATED_PATCH | TRANSDERMAL | 0 refills | Status: DC

## 2016-10-30 MED ORDER — IPRATROPIUM-ALBUTEROL 0.5-2.5 (3) MG/3ML IN SOLN: 3.0000 mL | Freq: Four times a day (QID) | RESPIRATORY_TRACT | Status: DC | PRN

## 2016-10-30 MED ORDER — ALBUTEROL SULFATE (2.5 MG/3ML) 0.083% IN NEBU
INHALATION_SOLUTION | RESPIRATORY_TRACT | Status: AC
  Administered 2016-10-30: 2.5 mg
  Filled 2016-10-30: qty 3

## 2016-10-30 MED ORDER — SENNOSIDES-DOCUSATE SODIUM 8.6-50 MG PO TABS: 1.0000 | ORAL_TABLET | Freq: Two times a day (BID) | ORAL | 0 refills | Status: AC

## 2016-10-30 MED ORDER — AMLODIPINE BESYLATE 10 MG PO TABS: 10.0000 mg | ORAL_TABLET | Freq: Every day | ORAL | 0 refills | Status: DC

## 2016-10-30 MED ORDER — FENTANYL 12 MCG/HR TD PT72
12.5000 ug | MEDICATED_PATCH | TRANSDERMAL | Status: DC
  Administered 2016-10-30: 12.5 ug via TRANSDERMAL
  Filled 2016-10-30: qty 1

## 2016-10-30 MED ORDER — LORAZEPAM 2 MG/ML IJ SOLN
0.5000 mg | INTRAMUSCULAR | Status: DC | PRN
  Administered 2016-10-30 – 2016-10-31 (×3): 0.5 mg via INTRAVENOUS
  Filled 2016-10-30 (×3): qty 1

## 2016-10-30 NOTE — Progress Notes (Signed)
Oncology Nurse Navigator Documentation  Visited Ms [name] to check on her well-being, provide support and encouragement. She was in recliner, alert. Will continue to support during this admission.  [name], RN, BSN, [place] On[facility] Nurse [place] [facility] [phone]

## 2016-10-30 NOTE — Progress Notes (Signed)
Report called to Moore at [facility]. [facility] has been notified for1800 pick up. Patient to be transported.

## 2016-10-30 NOTE — Progress Notes (Addendum)
Nasheema,RN an [facility] notified that the pt will not be discharged to the facility at this time and will be re-evaluated in the am to determine discharge.

## 2016-10-30 NOTE — Discharge Summary (Signed)
Physician Discharge Summary  Patient ID: [name] MRN: [id number] DOB/AGE: Mar 05, 1934 83 y.o.  Admit date: 10/20/2016 Discharge date: 10/30/2016  Primary Care Physician:  Charolette Forward, MD   Discharge Diagnoses:    Present on Admission: . Cancer of anterior two-thirds of tongue ([place]) . Dementia due to another medical condition . Hypokalemia   Discharge Medications:  Allergies as of 10/30/2016      Reactions   Tuberculin Tests Other (See Comments)   UNSPECIFIED REACTION  Pt is allergic to PPD (The PPD skin test is a method used to diagnose silent (latent) tuberculosis (TB) infection. PPD stands for purified protein derivative.) Pt should receive chest x-rays instead.      Medication List    STOP taking these medications   HYDROcodone-acetaminophen 7.5-325 mg/15 ml solution Commonly known as:  HYCET   ibuprofen 200 MG tablet Commonly known as:  ADVIL,MOTRIN   lisinopril 10 MG tablet Commonly known as:  PRINIVIL,ZESTRIL   potassium chloride 10 MEQ tablet Commonly known as:  K-DUR,KLOR-CON     TAKE these medications   amLODipine 10 MG tablet Commonly known as:  NORVASC Take 1 tablet (10 mg total) by mouth daily. Start taking on:  10/31/2016   fentaNYL 12 MCG/HR Commonly known as:  DURAGESIC - dosed mcg/hr Place 1 patch (12.5 mcg total) onto the skin every 3 (three) days. Start taking on:  02/16/33   folic acid 1 MG tablet Commonly known as:  FOLVITE Take 1 mg by mouth daily.   glimepiride 1 MG tablet Commonly known as:  AMARYL Take 1 mg by mouth daily with breakfast.   hydrALAZINE 10 MG tablet Commonly known as:  APRESOLINE Take 1 tablet (10 mg total) by mouth every 8 (eight) hours.   latanoprost 0.005 % ophthalmic solution Commonly known as:  XALATAN Place 1 drop into both eyes at bedtime.   levothyroxine 88 MCG tablet Commonly known as:  SYNTHROID, LEVOTHROID Take 88 mcg by mouth daily before breakfast.   metFORMIN 500 MG 24 hr  tablet Commonly known as:  GLUCOPHAGE-XR Take 500 mg by mouth daily after supper.   senna-docusate 8.6-50 MG tablet Commonly known as:  Senokot-S Take 1 tablet by mouth 2 (two) times daily.       Disposition and Follow-up:   Significant Diagnostic Studies:  X-ray Chest Pa And Lateral  Result Date: 10/20/2016 CLINICAL DATA:  83 year old female with dry cough. EXAM: CHEST  2 VIEW COMPARISON:  January 31, 2013 FINDINGS: The heart size is mildly enlarged. The hila and mediastinum are normal. No pulmonary nodules, masses, or focal infiltrates. No acute abnormalities. IMPRESSION: No active cardiopulmonary disease. Electronically Signed   By: Dorise Bullion III M.D   On: 10/20/2016 20:02   Ct Soft Tissue Neck W Contrast  Result Date: 10/19/2016 CLINICAL DATA:  83 y/o  F; EXAM: CT NECK WITH CONTRAST TECHNIQUE: Multidetector CT imaging of the neck was performed using the standard protocol following the bolus administration of intravenous contrast. CONTRAST:  58mL ISOVUE-300 IOPAMIDOL (ISOVUE-300) INJECTION 61% COMPARISON:  10/06/2016 PET-CT.  09/22/2016 CT of the neck. FINDINGS: Pharynx and larynx: Mass effect upon the airway which is displaced rightward and moderately narrowed at the level of the hypopharynx measuring 2.1 x 0.4 cm. Salivary glands: No inflammation, mass, or stone. Thyroid: Multiple thyroid nodules including a densely calcified nodule in right lobe of thyroid are stable. Lymph nodes: Massive necrotic cervical lymphadenopathy of right 2,3,5 and left 2,3,4,5 lymph node stations markedly increased in size when compared  with prior studies, for example a left-sided 2/3 level node measures 8.5 x 7.9 x 7.5 cm (AP x ML x CC series 2: Image 54 and 6:69), previously up to 5.3 cm. The necrotic lymph nodes have ill-defined margins with surrounding fat stranding indicating extranodal extension. The large right upper cervical lymph node at angle of mandible invades the right masticator space and has an  ill-defined margin with the right submandibular gland. Go the largest left upper cervical lymph node extends medially into the left carotid space, parapharyngeal space on, and has an ill-defined margin with both the left submandibular gland and parotid gland. Vascular: Left internal jugular vein thrombus, suspected tumor thrombus, now extends to the junction with the left brachiocephalic vein inferiorly and the craniocervical junction superiorly, progressed from prior studies. Limited intracranial: Negative. Visualized orbits: Negative. Mastoids and visualized paranasal sinuses: Clear. Skeleton: Stable cervical spondylosis. No high-grade bony canal stenosis. No destructive osseous lesion is identified. Upper chest: Stable left upper lobe 4 mm nodule (series 2, image 88). Other: None. IMPRESSION: 1. Massive necrotic cervical lymphadenopathy is increased in size. For example, the largest left-sided node measures up to 8.5 cm, previously 5.3 cm. 2. Findings of extranodal extension of lymphadenopathy and probable invasion of the left internal jugular vein now extending to junction with left brachiocephalic vein and craniocervical junction. 3. Mass effect on the airway which is displaced rightward and moderately narrow the level of hypopharynx. 4. Stable left upper lobe 4 mm nodule. Electronically Signed   By: Kristine Garbe M.D.   On: 10/19/2016 17:54   Nm Pet Image Initial (pi) Skull Base To Thigh  Addendum Date: 10/06/2016   ADDENDUM REPORT: 10/06/2016 14:08 ADDENDUM: The original report was by Dr. Van Clines. The following addendum is by Dr. Van Clines: I discussed these findings by telephone with Dr. Wilburn Cornelia at 1:50 p.m. on 10/06/2016. Electronically Signed   By: Van Clines M.D.   On: 10/06/2016 14:08   Result Date: 10/06/2016 CLINICAL DATA:  Initial treatment strategy for tongue cancer with lymph node metastatic disease. EXAM: NUCLEAR MEDICINE PET SKULL BASE TO THIGH  TECHNIQUE: 5.4 mCi F-18 FDG was injected intravenously. Full-ring PET imaging was performed from the skull base to thigh after the radiotracer. CT data was obtained and used for attenuation correction and anatomic localization. FASTING BLOOD GLUCOSE:  Value: 165 mg/dl COMPARISON:  Multiple exams, including 09/22/2016 and 05/27/2011 CT scans FINDINGS: NECK A left cervical node spanning levels IIa, IIb, and III measures 6.8 cm in short axis and has a necrotic center with hypermetabolic rim, maximum SUV 17.1. This distorts and displaces the left lateral limb of the hyoid bone. There appears to be some less certain nodularity/adenopathy along the upper margin of this lesion which extends from tangential to the mandible down to tangential to the clavicle. Just below the angle of the right mandible there is a level Ib lymph node measuring 4.2 cm in short axis with maximum SUV 19.1. The medial margin of this lesion abuts the right tongue base in the posteromedial margin abuts the right side of the hyoid bone. Aside from this mass along the right side of the tongue base, I do not see a separate hypermetabolic tongue mass. A right station IIa lymph node measuring 1.8 cm in diameter has a maximum SUV of 15.1. The combination of this bulky adenopathy significantly narrows the supraglottic trachea, causing a slit like appearance for example on image 28/4. 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 Low-density  left internal jugular vein is severely effaced by the massive left neck adenopathy and probably thrombosed as well. Today' s exam was not performed with IV contrast and accordingly venous assessment is problematic. CHEST No hypermetabolic mediastinal or hilar nodes. 4 mm left upper lobe nodule image 15/8, not currently hypermetabolic, no change from 18/29/9371. Coronary, aortic arch, and branch vessel atherosclerotic vascular disease. ABDOMEN/PELVIS No abnormal hypermetabolic activity within  the liver, pancreas, adrenal glands, or spleen. No hypermetabolic lymph nodes in the abdomen or pelvis. Cholelithiasis. Hepatic morphology is somewhat lobular in raises the possibility of cirrhosis, correlate with clinical history. Trace perihepatic fluid along the right hepatic lobe for example image 102/4. No splenomegaly but there are splenic calcifications suggesting old granulomatous disease. Faint calcification along the slightly nodular left adrenal gland common no change from 6967, no hypermetabolic activity, thought to likely be benign. Aortoiliac atherosclerotic vascular disease. Sigmoid colon diverticulosis without active diverticulitis. Pelvic floor laxity. Hypermetabolic activity along the perineum is most compatible with incontinence. Trace amount of free pelvic fluid. Appendix normal. SKELETON No focal hypermetabolic activity to suggest skeletal metastasis. Prominent sclerosis and lucency along the sacroiliac joints compatible with considerable chronic sacroiliitis, but without current hypermetabolic activity. IMPRESSION: 1. Bulky hypermetabolic bilateral neck adenopathy compatible with malignancy. These nodes are so large that they significantly narrow the supraglottic airway and could threaten the airway if further swelling or nodal enlargement occurs. I do not see a separate tongue mass (the original density along the left anterior tongue shown on 04/22/2016 is no longer seen) although the dominant right level Ib lymph node abuts the right tongue base. 2. 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 3. Hepatic morphology raises the possibility of cirrhosis, correlate with clinical history. Trace ascites. 4. 4 mm left upper lobe nodule is not changed from 09/22/2016, is not hypermetabolic but is below sensitive PET-CT size thresholds. 5. Other imaging findings of  potential clinical significance: Coronary, aortic arch, and branch vessel atherosclerotic vascular disease. Aortoiliac atherosclerotic vascular disease. Cholelithiasis. Old granulomatous disease. Sigmoid diverticulosis. Pelvic floor laxity. Perineal activity compatible with urinary incontinence. Considerable chronic sacroiliitis. Radiology assistant personnel have been notified to put me in telephone contact with the referring physician or the referring physician's clinical representative in order to discuss these findings. Once this communication is established I will issue an addendum to this report for documentation purposes. Electronically Signed: By: Van Clines M.D. On: 10/06/2016 13:34   Ir Fluoro Guide Cv Line Right  Result Date: 10/22/2016 INDICATION: Patient with history of tongue cancer; central venous access requested for chemotherapy. EXAM: RIGHT UPPER EXTREMITY PICC LINE PLACEMENT WITH ULTRASOUND AND FLUOROSCOPIC GUIDANCE MEDICATIONS: None ANESTHESIA/SEDATION: None FLUOROSCOPY TIME:  Fluoroscopy Time:  12 seconds COMPLICATIONS: None immediate. PROCEDURE: The patient/family was advised of the possible risks and complications and agreed to undergo the procedure. The patient was then brought to the angiographic suite for the procedure. The right arm was prepped with chlorhexidine, draped in the usual sterile fashion using maximum barrier technique (cap and mask, sterile gown, sterile gloves, large sterile sheet, hand hygiene and cutaneous antisepsis) and infiltrated locally with 1% Lidocaine. Ultrasound demonstrated patency of the right basilic vein, and this was documented with an image. Under real-time ultrasound guidance, this vein was accessed with a 21 gauge micropuncture needle and image documentation was performed. A 0.018 wire was introduced in to the vein. Over this, a 5 [place] double  lumen power injectable PICC was advanced to the lower SVC/right atrial junction. Fluoroscopy during the  procedure and fluoro spot radiograph confirms appropriate catheter position. The catheter was flushed and covered with a sterile dressing. Catheter length: 36 cm IMPRESSION: Successful right arm power PICC line placement with ultrasound and fluoroscopic guidance. The catheter is ready for use. Read by: Rowe Robert, PA-C Electronically Signed   By: Sandi Mariscal M.D.   On: 10/22/2016 11:49   Ir US Guide Vasc Access Right  Result Date: 10/22/2016 INDICATION: Patient with history of tongue cancer; central venous access requested for chemotherapy. EXAM: RIGHT UPPER EXTREMITY PICC LINE PLACEMENT WITH ULTRASOUND AND FLUOROSCOPIC GUIDANCE MEDICATIONS: None ANESTHESIA/SEDATION: None FLUOROSCOPY TIME:  Fluoroscopy Time:  12 seconds COMPLICATIONS: None immediate. PROCEDURE: The patient/family was advised of the possible risks and complications and agreed to undergo the procedure. The patient was then brought to the angiographic suite for the procedure. The right arm was prepped with chlorhexidine, draped in the usual sterile fashion using maximum barrier technique (cap and mask, sterile gown, sterile gloves, large sterile sheet, hand hygiene and cutaneous antisepsis) and infiltrated locally with 1% Lidocaine. Ultrasound demonstrated patency of the right basilic vein, and this was documented with an image. Under real-time ultrasound guidance, this vein was accessed with a 21 gauge micropuncture needle and image documentation was performed. A 0.018 wire was introduced in to the vein. Over this, a 5 [place] double lumen power injectable PICC was advanced to the lower SVC/right atrial junction. Fluoroscopy during the procedure and fluoro spot radiograph confirms appropriate catheter position. The catheter was flushed and covered with a sterile dressing. Catheter length: 36 cm IMPRESSION: Successful right arm power PICC line placement with ultrasound and fluoroscopic guidance. The catheter is ready for use. Read by: Rowe Robert,  PA-C Electronically Signed   By: Sandi Mariscal M.D.   On: 10/22/2016 11:49   Dg Chest Port 1 View  Result Date: 10/23/2016 CLINICAL DATA:  Cough and shortness of breath EXAM: PORTABLE CHEST 1 VIEW COMPARISON:  Oct 20, 2016 FINDINGS: There is extensive airspace consolidation throughout much of the right lung. There is a degree of superimposed pleural effusion on the right. Left lung is clear. Heart is upper normal in size with pulmonary vascularity normal. There is aortic atherosclerosis. No evident adenopathy. Central catheter tip is at cavoatrial junction. No pneumothorax. Calcification is noted in the right carotid artery with what appears to be dilatation in the right carotid artery measuring 1.3 cm. IMPRESSION: Extensive airspace consolidation throughout much the right lung with superimposed right pleural effusion. Suspect widespread right lung pneumonia. Left lung clear. Stable cardiac silhouette. Aortic atherosclerosis. Calcification in the right carotid artery with apparent aneurysmal dilatation, calcified, in the right carotid artery. Electronically Signed   By: Lowella Grip III M.D.   On: 10/23/2016 07:20   Dg Swallowing Func-speech Pathology  Result Date: 10/21/2016 Objective Swallowing Evaluation: Type of Study: MBS-Modified Barium Swallow Study Patient Details Name: [name] MRN: [id number] Date of Birth: [birth year] Today's Date: 10/21/2016 Time: SLP Start Time (ACUTE ONLY): 1457-SLP Stop Time (ACUTE ONLY): 1027 SLP Time Calculation (min) (ACUTE ONLY): 20 min Past Medical History: Past Medical History: Diagnosis Date . Abdominal pain  . Blood dyscrasia   HYPOKALEMIA . Blood transfusion  . COPD (chronic obstructive pulmonary disease) ([place])  . Dementia   pathologic aging and dementia per daughter . Diabetes mellitus   Type II . Heart murmur  . History of blood transfusion  . Hypertension  .  Hypothyroid  . Neuropathy  . Substance abuse   ETOH with dementia and prior encephalopathy and  withdrawal . Tongue cancer ([place]) 05/13/2016 . Tricuspid regurgitation 2012  mild . Urinary tract infection  Past Surgical History: Past Surgical History: Procedure Laterality Date . ABDOMINAL HYSTERECTOMY    1970's . BREAST SURGERY Left   Lumpectomy . COLONOSCOPY   . ESOPHAGOGASTRODUODENOSCOPY  05/17/2011  Procedure: ESOPHAGOGASTRODUODENOSCOPY (EGD);  Surgeon: Lafayette Dragon, MD;  Location: [facility] ENDOSCOPY;  Service: Endoscopy;  Laterality: N/A; . EXCISION OF TONGUE LESION Left 05/13/2016  Procedure: WIDE LOCAL EXCISION OF LEFT LATERAL TONGUE CANCER;  Surgeon: Jerrell Belfast, MD;  Location: [facility];  Service: ENT;  Laterality: Left; . EXCISION OF TONGUE LESION Left 06/12/2016  Procedure: RE-EXCISION OF LEFT  TONGUE CANCER;  Surgeon: Jerrell Belfast, MD;  Location: [place];  Service: ENT;  Laterality: Left; . EYE SURGERY Bilateral   Cataract . TONGUE SURGERY  05/13/2016  WIDE LOCAL EXCISION OF LEFT LATERAL TONGUE CANCER (Left) with Local Soft Tissue Reconstruction  . TONSILLECTOMY AND ADENOIDECTOMY   HPI: Pt is an 83 yo female adm to [facility] with h/o cancer of anterior 2/3 tongue s/p surgery 05/2016 and now has rapidly growing tumor in her left neck and jaw region for which she is scheduled to have radiation.   Massive neck/cervical lymphadenopathy 8.5 cm with invasion of IJ with tumor thrombus per MD note.   Pt with left supraglottic larynx/pharynx, right submandibular - node 4 cm.  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  Swallow evaluation ordered.   Pt denies ever having xray swallow study done before.  Subjective: pt awake in chair Assessment / Plan / Recommendation CHL IP CLINICAL IMPRESSIONS 10/21/2016 Clinical Impression Pt tested in lateral and A-P view.  Pt presents with mild oropharyngeal dysphagia without aspiration of any consistency tested (thin, nectar, pudding, cracker and barium tablet). Decreased oral manipulation due to pt's lingual  surgery noted.  Pharyngeal swallow was overall strong without significant residuals/aspiration.  Very trace laryngeal penetration of thin liquid secondary to decreased timing of laryngeal closure cleared indpendently.  Of note, pt voice was clear prior to and during MBS - she reports wet voice occuring AFTER meals.  Pt is likely having aspiration of secretions but was protective of her airway with MBS.   Using live imaging, educated pt to findings/recommendations.   When Md indicates, recommend advance to dys3/thin due to oral deficits with strict precautions. SlP to follow briefly for pt education/po toelrance.  Thanks for this consult.  SLP Visit Diagnosis Dysphagia, oropharyngeal phase (R13.12) Attention and concentration deficit following -- Frontal lobe and executive function deficit following -- Impact on safety and function Mild aspiration risk   CHL IP TREATMENT RECOMMENDATION 10/21/2016 Treatment Recommendations Therapy as outlined in treatment plan below   Prognosis 10/21/2016 Prognosis for Safe Diet Advancement Fair Barriers to Reach Goals Severity of deficits;Time post onset Barriers/Prognosis Comment -- CHL IP DIET RECOMMENDATION 10/21/2016 SLP Diet Recommendations -- Liquid Administration via Straw Medication Administration Whole meds with liquid Compensations Slow rate;Small sips/bites;Follow solids with liquid Postural Changes Remain semi-upright after after feeds/meals (Comment);Seated upright at 90 degrees   CHL IP OTHER RECOMMENDATIONS 10/21/2016 Recommended Consults -- Oral Care Recommendations Oral care BID Other Recommendations --   CHL IP FOLLOW UP RECOMMENDATIONS 10/21/2016 Follow up Recommendations (No Data)   CHL IP FREQUENCY AND DURATION 10/21/2016 Speech Therapy Frequency (ACUTE ONLY) min 1 x/week  Treatment Duration 1 week      CHL IP ORAL PHASE 10/21/2016 Oral Phase Impaired Oral - Pudding Teaspoon -- Oral - Pudding Cup -- Oral - Honey Teaspoon -- Oral - Honey Cup -- Oral - Nectar Teaspoon -- Oral -  Nectar Cup Weak lingual manipulation Oral - Nectar Straw Weak lingual manipulation Oral - Thin Teaspoon Weak lingual manipulation Oral - Thin Cup Weak lingual manipulation Oral - Thin Straw Weak lingual manipulation Oral - Puree Weak lingual manipulation;Reduced posterior propulsion Oral - Mech Soft Weak lingual manipulation;Impaired mastication;Reduced posterior propulsion Oral - Regular -- Oral - Multi-Consistency -- Oral - Pill Weak lingual manipulation;Reduced posterior propulsion Oral Phase - Comment --  CHL IP PHARYNGEAL PHASE 10/21/2016 Pharyngeal Phase Impaired Pharyngeal- Pudding Teaspoon -- Pharyngeal -- Pharyngeal- Pudding Cup -- Pharyngeal -- Pharyngeal- Honey Teaspoon -- Pharyngeal -- Pharyngeal- Honey Cup -- Pharyngeal -- Pharyngeal- Nectar Teaspoon -- Pharyngeal -- Pharyngeal- Nectar Cup WFL Pharyngeal -- Pharyngeal- Nectar Straw WFL Pharyngeal -- Pharyngeal- Thin Teaspoon WFL Pharyngeal -- Pharyngeal- Thin Cup WFL Pharyngeal -- Pharyngeal- Thin Straw WFL Pharyngeal -- Pharyngeal- Puree WFL Pharyngeal -- Pharyngeal- Mechanical Soft WFL Pharyngeal -- Pharyngeal- Regular -- Pharyngeal -- Pharyngeal- Multi-consistency -- Pharyngeal -- Pharyngeal- Pill WFL Pharyngeal -- Pharyngeal Comment --  CHL IP CERVICAL ESOPHAGEAL PHASE 10/21/2016 Cervical Esophageal Phase Impaired Pudding Teaspoon -- Pudding Cup -- Honey Teaspoon -- Honey Cup -- Nectar Teaspoon -- Nectar Cup -- Nectar Straw -- Thin Teaspoon -- Thin Cup -- Thin Straw -- Puree -- Mechanical Soft -- Regular -- Multi-consistency -- Pill -- Cervical Esophageal Comment barium tablet appeared to lodge at mid-esophagus without awareness, further bolus of pudding did not faciliate clearance however multiple boluses of liquids effective;  radiologist is not present and MBS is not designed to examine below UES - if MD deems indicated, pt may benefit from dedicated esophageal evaluation No flowsheet data found. Luanna Salk, MS [facility] SLP [phone]                Discharge Laboratory Values: Lab Results  Component Value Date   WBC 66.0 (HH) 10/30/2016   HGB 10.6 (L) 10/30/2016   HCT 33.7 (L) 10/30/2016   MCV 84.3 10/30/2016   PLT 212 10/30/2016   Lab Results  Component Value Date   NA 137 10/30/2016   K 3.8 10/30/2016   CL 100 (L) 10/30/2016   CO2 29 10/30/2016    Brief H and P: For complete details please refer to admission H and P, but in brief, she was admitted for management of hypercalcemia GENERAL:alert, no distress and comfortable SKIN: skin color, texture, turgor are normal, no rashes or significant lesions EYES: normal, Conjunctiva are pink and non-injected, sclera clear OROPHARYNX:no exudate, no erythema and lips, buccal mucosa, and tongue normal  NECK: supple, thyroid normal size, non-tender, without nodularity LYMPH: Persistent large bilateral neck lymphadenopathy, reduce in size LUNGS: clear to auscultation and percussion with normal breathing effort HEART: regular rate & rhythm and no murmurs and no lower extremity edema ABDOMEN:abdomen soft, non-tender and normal bowel sounds Musculoskeletal:no cyanosis of digits and no clubbing  NEURO: alert & oriented x 3 with fluent speech, no focal motor/sensory defi Assessment & Plan:   Tongue cancer ([place]) The patient has complex past medical history including renal failure on hemodialysis (which has since resolved), severe substance abuse especially with alcoholism (has been abstinent)and background history of dementia. I have read a lot of the past medicalrecords, in collaboration with history of fromher daughter who appears to be  the principal caregiver. In essence, this patient has rapidly progressive disease in the neck with impeding airway compromise without further treatment. There is a concern about possible airway compromise with airway swelling with radiation treatment After extensive discussion at the ENT tumor board, the decision is made for her to undergo  chemotherapy in the hospital first in an attempt to shrink down the tumor PICC line was placed on 10/22/2016 with reduced dose cisplatin given on 10/22/16 and 10/29/16  Continue aggressive supportive care  Malignant hypercalcemia, resolved This has improvedon IV fluid resuscitation, IV dexamethasone, nasal calcitonin and Lasix  I have stop IV fluids due to recent fluid overload. Lasix was also discontinued I will stop dexamethasone and calcitonin She received 1 dose of IV Zometa on 10/24/2016  Severe hypertension, improved Improving on antihypertensives I will increase the dose of amlodipine and add hydralazine scheduled. Plan to avoid atenolol due to recent bradycardia and avoid lisinopril due to risk of kidney injury  Imminent airway compromise with mild stridor and excessive airway secretion, resolved Due to disease progression, improved since chemo and dexamethasone ENT service had evaluated her imaging and felt that tracheostomy at this point is not possible Excessive airway secretion has improved. Will stop nebulizers  Cancer pain The patient is unreliable and will not ask for pain medicine I recommend low-dose fentanyl patch  Aspiration pneumonia Formal swallow evaluation, result available, risk of aspiration Repeat chest x-ray on 10/23/2016 show evidence of pneumonia, likely due to aspiration pneumonia We will consult pharmacist and switch her antibiotic coverage to Zosyn Total treatment for 7 days, last day today, will stop Will also add continuous oxygen and Nebulizers  Leukocytosis with necrotic tumor with serosanguineous discharge/cellulitis Likely multifactorial due to pneumonia and steroids Continue antibiotic coverage andcontinue dry dressing changes  Severe hypokalemia, intermittent Could be due to potassium loss from chemotherapy of Zosyn We will replace intravenously  Severe constipation, resolved Will continue Senokot along with MiraLAX and  lactulose  Poor oral intake We will consult dietitian  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 After extensive review of prior evaluation by neurologist from [place], I felt that the patient indeed may have age related dementia but appears to be coping well with aggressive social support Physical therapist felt that she may benefit from skilled nursing facility I spoke with the daughter and she agreed The patient is not in the state of mind when she can make medical decision  CODE STATUS DNR  Goals of care, counseling/discussion The patient is aware shehas incurable disease and treatment is strictly palliative. We discussed importance of Advanced Directives and Living will. Her daughter appears to be the healthcare medical power of attorney I discussed goals of care with the patient's daughter and she agree with the plan of care as above  Discharge planning The patient had multiple reason to be admitted, namely malignant hypercalcemia, severe hypokalemia, leukocytosis with draining necrotic tumor and imminent airway compromise. The risk of not admitting her would be risk of death Pending SNF placement We will consult social worker and care management for skilled nursing facility placement  Physical Exam at Discharge: BP (!) 141/57 (BP Location: Left Arm)   Pulse 69   Temp 97.9 F (36.6 C) (Oral)   Resp 14   Ht 4\' 11"  (1.499 m)   Wt 107 lb (48.5 kg)   LMP 05/17/1983  SpO2 99%   BMI 21.61 kg/m  Hospital Course:  Active Problems:   Cancer of anterior two-thirds of tongue ([place])   Dementia due to another medical condition   Hypokalemia   DM (diabetes mellitus) ([place])   Metastasis to lymph nodes (HCC)   Hypercalcemia   Diet:  Regular  Activity:  As tolerated  Condition at Discharge:   stable  Signed: Dr. Heath Lark [phone]  10/30/2016, 1:36 PM

## 2016-10-30 NOTE — Progress Notes (Signed)
[name]   DOB:[birth year]   DG#:[id number]    Subjective: She complained of mild throat pain since discontinuation of schedule liquid morphine sulfate. There is no reported fever, chills or nausea. No cough. She is wondering when she can go home.  Objective:  Vitals:   10/29/16 2057 10/30/16 0501  BP: (!) 171/59 (!) 141/57  Pulse: 80 69  Resp: 16 14  Temp: 98.1 F (36.7 C) 97.9 F (36.6 C)     Intake/Output Summary (Last 24 hours) at 10/30/16 0836 Last data filed at 10/29/16 1848  Gross per 24 hour  Intake             1060 ml  Output              300 ml  Net              760 ml    GENERAL:alert, no distress and comfortable SKIN: skin color, texture, turgor are normal, no rashes or significant lesions EYES: normal, Conjunctiva are pink and non-injected, sclera clear OROPHARYNX:no exudate, no erythema and lips, buccal mucosa, and tongue normal  NECK: supple, thyroid normal size, non-tender, without nodularity LYMPH: Persistent large bilateral neck lymphadenopathy, reduce in size LUNGS: clear to auscultation and percussion with normal breathing effort HEART: regular rate & rhythm and no murmurs and no lower extremity edema ABDOMEN:abdomen soft, non-tender and normal bowel sounds Musculoskeletal:no cyanosis of digits and no clubbing  NEURO: alert & oriented x 3 with fluent speech, no focal motor/sensory deficits   Labs:  Lab Results  Component Value Date   WBC 66.0 (HH) 10/30/2016   HGB 10.6 (L) 10/30/2016   HCT 33.7 (L) 10/30/2016   MCV 84.3 10/30/2016   PLT 212 10/30/2016   NEUTROABS 63.4 (H) 10/30/2016    Lab Results  Component Value Date   NA 137 10/30/2016   K 3.8 10/30/2016   CL 100 (L) 10/30/2016   CO2 29 10/30/2016    Assessment & Plan:   Tongue cancer ([place]) The patient has complex past medical history including renal failure on hemodialysis (which has since resolved), severe substance abuse especially with alcoholism (has been abstinent)and  background history of dementia. I have read a lot of the past medicalrecords, in collaboration with history of fromher daughter who appears to be the principal caregiver. In essence, this patient has rapidly progressive disease in the neck with impeding airway compromise without further treatment. There is a concern about possible airway compromise with airway swelling with radiation treatment After extensive discussion at the ENT tumor board, the decision is made for her to undergo chemotherapy in the hospital first in an attempt to shrink down the tumor PICC line was placed on 10/22/2016 with reduced dose cisplatin given on 10/22/16 and 10/29/16  Continue aggressive supportive care  Malignant hypercalcemia, resolved This has improvedon IV fluid resuscitation, IV dexamethasone, nasal calcitonin and Lasix  I have stop IV fluids due to recent fluid overload. Lasix was also discontinued I will stop dexamethasone and calcitonin She received 1 dose of IV Zometa on 10/24/2016  Severe hypertension, improved Improving on antihypertensives I will increase the dose of amlodipine and add hydralazine scheduled. Plan to avoid atenolol due to recent bradycardia and avoid lisinopril due to risk of kidney injury  Imminent airway compromise with mild stridor and excessive airway secretion, resolved Due to disease progression, improved since chemo and dexamethasone ENT service had evaluated her imaging and felt that tracheostomy at this point is not possible Excessive airway secretion  has improved. Will stop nebulizers  Cancer pain The patient is unreliable and will not ask for pain medicine I recommend low-dose fentanyl patch  Aspiration pneumonia Formal swallow evaluation, result available, risk of aspiration Repeat chest x-ray on 10/23/2016 show evidence of pneumonia, likely due to aspiration pneumonia We will consult pharmacist and switch her antibiotic coverage to Zosyn Total treatment for 7  days, last day today, will stop Will also add continuous oxygen and Nebulizers  Leukocytosis with necrotic tumor with serosanguineous discharge/cellulitis Likely multifactorial due to pneumonia and steroids Continue antibiotic coverage andcontinue dry dressing changes  Severe hypokalemia, intermittent Could be due to potassium loss from chemotherapy of Zosyn We will replace intravenously  Severe constipation, resolved Will continue Senokot along with MiraLAX and lactulose  Poor oral intake We will consult dietitian  Steroid induced hyperglycemia We will titrate insulin  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 After extensive review of prior evaluation by neurologist from [place], I felt that the patient indeed may have age related dementia but appears to be coping well with aggressive social support Physical therapist felt that she may benefit from skilled nursing facility I spoke with the daughter and she agreed The patient is not in the state of mind when she can make medical decision  CODE STATUS DNR  Goals of care, counseling/discussion The patient is aware shehas incurable disease and treatment is strictly palliative. We discussed importance of Advanced Directives and Living will. Her daughter appears to be the healthcare medical power of attorney I discussed goals of care with the patient's daughter and she agree with the plan of care as above  Discharge planning The patient had multiple reason to be admitted, namely malignant hypercalcemia, severe hypokalemia, leukocytosis with draining necrotic tumor and imminent airway compromise. The risk of not admitting her would be risk of death Pending SNF placement We will consult social worker and care management for skilled nursing facility placement  Heath Lark, MD 10/30/2016   8:36 AM

## 2016-10-30 NOTE — Progress Notes (Signed)
CSW met with patient daughter who is patient legal guardian.  CSW offered for patient daughter to speak with RNCM. Patient daughter agreeable. RNCM and CSW met with daughter in conference room. RNCM attempted to gather information about situation and daughter asked to speak with administration. CSW called and spoke with Mudlogger of social work and discussed daughters concerns. Social Work Mudlogger is not on campus to speak with patient daughter directly and referred CSW to speak Air traffic controller on [employer] for Assistance. RN called [facility] for assistance.  AC reports she will assist with patient transport to SNF once PTAR arrives.    CSW completed transport packet for PTAR to SNF. Nurse number for report / room number on packet.  PTAR scheduled for Transport for 6:00pm. RN agreeable with time.  No other social work needs identified.   Kathrin Greathouse, Latanya Presser, MSW Clinical Social Worker 5E and Psychiatric Service Line [phone] 10/30/2016  4:09 PM

## 2016-10-30 NOTE — Progress Notes (Signed)
Patient has been refusing breathing treatments. RT made PRN.

## 2016-10-30 NOTE — Progress Notes (Signed)
Physical Therapy Treatment Patient Details Name: [name] MRN: [id number] DOB: 01-07-1934 Today's Date: 10/30/2016    History of Present Illness 83 yo female admitted with tongue cancer, rapidly growing tumor bil jaw regions, hypokalemia, leukocytosis. Hx of DM, COPD, neuropathy    PT Comments    Pt a bit impulsive. Assisted from bed to [facility] then back to bed required VC's safety with lines/O2 tubing.  Assisted with amb in hallway as pt held to IV pole.  RA sats decreased to 74% so reapplied 2 lts to achieve 92%.  Pt moving well. Will need ST Rehab at SNF prior to returning home.   SATURATION QUALIFICATIONS: (This note is used to comply with regulatory documentation for home oxygen)  Patient Saturations on Room Air at Rest = 90%  Patient Saturations on Room Air while Ambulating = 84%  Patient Saturations on 2 Liters of oxygen while Ambulating = 92%  Please briefly explain why patient needs home oxygen:  Pt requires supplemental oxygen to achieve therapeutic level  Follow Up Recommendations  SNF     Equipment Recommendations  None recommended by PT    Recommendations for Other Services       Precautions / Restrictions Precautions Precautions: Fall Precaution Comments: monitior O2 sats Restrictions Weight Bearing Restrictions: No    Mobility  Bed Mobility Overal bed mobility: Needs Assistance Bed Mobility: Supine to Sit     Supine to sit: Supervision     General bed mobility comments: safety with IV line and O2 tubing  Transfers Overall transfer level: Needs assistance Equipment used: None Transfers: Sit to/from [facility] Sit to Stand: Supervision;Min guard Stand pivot transfers: Supervision;Min guard       General transfer comment: close guard for safety. VCs safety from bed to [facility] then back to bed  Ambulation/Gait Ambulation/Gait assistance: Supervision;Min guard Ambulation Distance (Feet): 58 Feet Assistive device: IV  pole Gait Pattern/deviations: Step-through pattern;Decreased stride length Gait velocity: WFL   General Gait Details: close guard for safety. O2 sats 84% on RA during ambulation. Reapplied 2 lts to achieve 92%   Stairs            Wheelchair Mobility    Modified Rankin (Stroke Patients Only)       Balance                                            Cognition Arousal/Alertness: Awake/alert Behavior During Therapy: WFL for tasks assessed/performed Overall Cognitive Status: Within Functional Limits for tasks assessed                                        Exercises      General Comments        Pertinent Vitals/Pain Pain Location: neck, shoulders Pain Descriptors / Indicators: Grimacing Pain Intervention(s): Monitored during session    Home Living                      Prior Function            PT Goals (current goals can now be found in the care plan section) Progress towards PT goals: Progressing toward goals    Frequency    Min 3X/week      PT Plan Current plan remains appropriate  Co-evaluation              AM-PAC PT "6 Clicks" Daily Activity  Outcome Measure  Difficulty turning over in bed (including adjusting bedclothes, sheets and blankets)?: None Difficulty moving from lying on back to sitting on the side of the bed? : None Difficulty sitting down on and standing up from a chair with arms (e.g., wheelchair, bedside commode, etc,.)?: A Little Help needed moving to and from a bed to chair (including a wheelchair)?: A Little Help needed walking in hospital room?: A Little Help needed climbing 3-5 steps with a railing? : A Little 6 Click Score: 20    End of Session Equipment Utilized During Treatment: Gait belt Activity Tolerance: Patient tolerated treatment well Patient left: in chair;with call bell/phone within reach   PT Visit Diagnosis: Muscle weakness (generalized) (M62.81);Difficulty  in walking, not elsewhere classified (R26.2)     Time: 1035-1100 PT Time Calculation (min) (ACUTE ONLY): 25 min  Charges:  $Gait Training: 8-22 mins $Therapeutic Activity: 8-22 mins                    G Codes:       {Vernisha Bacote  PTA [facility]  Acute  Rehab Pager      [phone]

## 2016-10-30 NOTE — Progress Notes (Signed)
Dr. Lindi Adie called and notified of pt's refusal to be discharged to [facility]. Pt's daughter also notified of pt's refusal to be discharged. Dr. Lindi Adie states that is ok to hold discharge for tonight and have re-evaluation in the am.

## 2016-10-30 NOTE — Clinical Social Work Placement (Addendum)
   CLINICAL SOCIAL WORK PLACEMENT  NOTE  Date:  10/30/2016  Patient Details  Name: [name] MRN: [id number] Date of Birth: April 30, 1934  Clinical Social Work is seeking post-discharge placement for this patient at the [place] level of care (*CSW will initial, date and re-position this form in  chart as items are completed):  Yes   Patient/family provided with [facility] Work Department's list of facilities offering this level of care within the geographic area requested by the patient (or if unable, by the patient's family).  Yes   Patient/family informed of their freedom to choose among providers that offer the needed level of care, that participate in Medicare, Medicaid or managed care program needed by the patient, have an available bed and are willing to accept the patient.  Yes   Patient/family informed of [facility]'s ownership interest in [facility] and [facility], as well as of the fact that they are under no obligation to receive care at these facilities.  PASRR submitted to EDS on       PASRR number received on       Existing PASRR number confirmed on 10/28/16     FL2 transmitted to all facilities in geographic area requested by pt/family on 10/28/16     FL2 transmitted to all facilities within larger geographic area on       Patient informed that his/her managed care company has contracts with or will negotiate with certain facilities, including the following:  [place]     Yes   Patient/family informed of bed offers received.  Patient chooses bed at [facility]     Physician recommends and patient chooses bed at      Patient to be transferred to [facility] on 10/30/16.  Patient to be transferred to facility by PTAR     Patient family notified on 10/30/16 of transfer.  Name of family member notified: daughter  PHYSICIAN       Additional Comment:     _______________________________________________ Lia Hopping, LCSW 10/30/2016, 2:30 PM

## 2016-10-31 DIAGNOSIS — J690 Pneumonitis due to inhalation of food and vomit: Secondary | ICD-10-CM

## 2016-10-31 DIAGNOSIS — F0390 Unspecified dementia without behavioral disturbance: Secondary | ICD-10-CM

## 2016-10-31 DIAGNOSIS — R839 Unspecified abnormal finding in cerebrospinal fluid: Secondary | ICD-10-CM

## 2016-10-31 DIAGNOSIS — I10 Essential (primary) hypertension: Secondary | ICD-10-CM

## 2016-10-31 LAB — GLUCOSE, CAPILLARY
GLUCOSE-CAPILLARY: 127 mg/dL — AB (ref 65–99)
Glucose-Capillary: 189 mg/dL — ABNORMAL HIGH (ref 65–99)
Glucose-Capillary: 155 mg/dL — ABNORMAL HIGH (ref 65–99)

## 2016-10-31 MED ORDER — HEPARIN SOD (PORK) LOCK FLUSH 100 UNIT/ML IV SOLN
500.0000 [IU] | Freq: Once | INTRAVENOUS | Status: AC
  Administered 2016-10-31: 500 [IU] via INTRAVENOUS
  Filled 2016-10-31: qty 5

## 2016-10-31 NOTE — Progress Notes (Signed)
Addendum to progress note This is to state that there are no changes to the discharge summary dated 10/30/16. Patient is to be discharged to [facility]

## 2016-10-31 NOTE — Pre-Procedure Instructions (Signed)
Update to Progress Note This is to state that there are no changes to discharge summary dated 10/30/16

## 2016-10-31 NOTE — Progress Notes (Signed)
HEMATOLOGY-ONCOLOGY PROGRESS NOTE  SUBJECTIVE: Patient refused discharge yesterday. She was kept overnight so that we can discuss with case managers and social workers along with her family to determine the best course of action. Patient unfortunately has a very large head and neck tumor and has responded to initial chemotherapy with improvement in her breathing and symptoms. She continues to have stridor this morning. It is very difficult to understand her but she is nodding to questions. Her daughter is power of attorney as well as her legal guardian. I discussed with her daughter on the telephone as well. Patient is able to get up and move around. However she is frail.  OBJECTIVE: REVIEW OF SYSTEMS:   Difficult to obtain because of difficulty in understanding her. She reported that she does not have any pain. Clearly she has stridor  PHYSICAL EXAMINATION: ECOG PERFORMANCE STATUS: 3 - Symptomatic, >50% confined to bed  Vitals:   10/31/16 0657 10/31/16 0757  BP: (!) 177/89 (!) 165/78  Pulse: 93 (!) 102  Resp: 20   Temp: 97.8 F (36.6 C)    Filed Weights   10/20/16 2200 10/21/16 1523  Weight: 107 lb 9.4 oz (48.8 kg) 107 lb (48.5 kg)    GENERAL:alert, no distress and comfortable SKIN: skin color, texture, turgor are normal, no rashes or significant lesions EYES: normal, Conjunctiva are pink and non-injected, sclera clear OROPHARYNX:whitish discoloration on the tongue but it not thrush  NECK: massive neck tumors LUNGS: difficult to auscultate because of stridor. Patient refuses to wear oxygen by nasal cannula. HEART: regular rate & rhythm and no murmurs and no lower extremity edema ABDOMEN:abdomen soft, non-tender and normal bowel sounds NEURO: difficult to test but patient is moving all extremities  LABORATORY DATA:  I have reviewed the data as listed CMP Latest Ref Rng & Units 10/30/2016 10/29/2016 10/28/2016  Glucose 65 - 99 mg/dL 137(H) 182(H) 134(H)  BUN 6 - 20 mg/dL 19 22(H) 13   Creatinine 0.44 - 1.00 mg/dL 0.64 0.80 0.60  Sodium 135 - 145 mmol/L 137 138 136  Potassium 3.5 - 5.1 mmol/L 3.8 4.0 3.1(L)  Chloride 101 - 111 mmol/L 100(L) 95(L) 91(L)  CO2 22 - 32 mmol/L 29 31 34(H)  Calcium 8.9 - 10.3 mg/dL 8.4(L) 9.1 8.5(L)  Total Protein 6.5 - 8.1 g/dL 6.5 7.2 7.1  Total Bilirubin 0.3 - 1.2 mg/dL 0.4 0.5 0.5  Alkaline Phos 38 - 126 U/L 78 88 84  AST 15 - 41 U/L 15 22 20   ALT 14 - 54 U/L 11(L) 14 13(L)    Lab Results  Component Value Date   WBC 66.0 (HH) 10/30/2016   HGB 10.6 (L) 10/30/2016   HCT 33.7 (L) 10/30/2016   MCV 84.3 10/30/2016   PLT 212 10/30/2016   NEUTROABS 63.4 (H) 10/30/2016    ASSESSMENT AND PLAN: 1. Tongue cancer: Status post 2 cycles of low-dose cisplatin chemotherapy on 10/23/2006 and 10/29/2016. Patient has made progress with her breathing and stridor. ENT had also been very closely monitoring her. When is concerned about her risk of airway compromise. And this risk may be worse if she does get radiation therapy and because of that she did not receive radiation yet. It was also felt that she would not be able to tolerate a tracheostomy either. 2. Severe leukocytosis secondary to the cancer. 3. Hypercalcemia: Has improved with supportive treatment 4. Severe hypertension: Patient refused to take this morning's oral antihypertensive medications. They will try to give it to her again later. 5.  Aspiration pneumonia: previously treated with antibiotics. 6. Discharge issues: Patient was supposed to have been discharged last night to the nursing home. However she refused to go in because of the discharge was held. I'm unable to communicate effectively with the patient because of her stridor. I discussed the case extensively with our social worker as well as the patient's daughter. Everyone is in agreement that she should go to the nursing home and the daughter will come today around known time to convince her to go.if the patient still refuses to go,  then I am not certain about our options. Patient's daughter was made aware that refusing to go could lead to denial from insurance to pay for part of her stay.  I also communicated with Dr. Alvy Bimler.

## 2016-10-31 NOTE — Clinical Social Work Note (Addendum)
Patient will discharge today to [facility] once discharge summary is completed by MD and approved by admissions director at facility. Ms. [name] will be transported by ambulance. Family is at the bedside and is aware of this evenings discharge. CSW was given the following instructions by Abigail Butts with [facility]:  **If the d/c summary is completed and faxed to [facility] before 7 pm, call the facility - [phone] and ask for Bill.  **If the d/c summary is completed and faxed to [facility] after 7 pm, call Abigail Butts, admissions director at [phone]. **If there are any questions, please call Abigail Butts at [phone]. **Once d/c summary approved, the nurse needs to call report - [phone]. If there is a problem reaching nursing staff, call Abigail Butts. **Call transport - [phone]. Ambulance form already completed by CSW and packet is in chart.  7:20 pm - Discharge summary transmitted to [facility], reviewed and approved by [name], [name], [name]. CSW talked with nurse and informed her that transport needs to be called and everything else taken care of. Family is aware of discharge. CSW called daughter [name] at 7:24 pm and informed her that patient will be discharging to [facility] this evening.  CSW signing off.    Syndey Jaskolski Givens, MSW, LCSW Licensed Clinical Social Worker [place] [phone]

## 2016-10-31 NOTE — Progress Notes (Signed)
Discharge Summary Date of admission: 10/20/16 Date of discharge: 10/31/16 Admission diagnosis: tongue cancer with hypercalcemia and respiratory issues  Discharge diagnosis:  1. tongue cancer 2. Hypercalcemia 3. Aspiration pneumonia 4. Respiratory stridor 5. History of alcoholism and substance abuse 6. Dementia 7. Hypertension  8. Constipation 9. Steroid induced hyperglycemia    Hospitalist  course: patient was admitted and hypercalcemia was tested. Multidisciplinary evaluation with ENT, rad onc and medical oncology was done and it was felt that she should be treated with cisplatin alone because radiation could cause more respiratory distress. She tolerated 2 doses of cisplatin very well. She was felt to be high risk for trach. She was also treated for aspiration pneumonia  Once maximal improvement in her respiratory  status was obtained, she was evaluated and felt to be a candidate for SNF. However she initially refused but once Family was here, she agreed to it  Significant labs: severe leukocytes is due to her malignancy Pending labs/ scans: None  Discharge instructions 1. Follow up with Dr. Alvy Bimler for Chemo 2. Diet: soft 3. Ok to take P.O. meds 4. Activity: As tolerated 5. Patient needs PT/OT and Dietary  <BR >

## 2016-10-31 NOTE — Progress Notes (Signed)
[facility] here for transport to [facility].Pt's packet provided to transporters.Son here also.Pt has picc line in place and was flushed per policy.Pt's belongings also given.Pt awake,alert and able to stand and step to stretcher.Called [facility] at [facility] and gave report to [email or web address]

## 2016-10-31 NOTE — Progress Notes (Signed)
[facility] arrived to transport pt to [facility], but the pt is refusing to leave at this time. Pt states she wants to be discharged home and will only go to the facility if she signs a paper that says she will only stay for a week. Explained to the pt that the MD at the facility would have to determine how long she would stay and that her daughter would like for her to be discharged to ensure she has proper care and is able to take care of herself. Pt's daughter called and notified of pt's refusal. Melvenia Needles the [facility] for the night shift, security and [employer] at the bedside to encourage the pt to be discharged to the facility, but pt still refusing to leave.

## 2016-10-31 NOTE — Discharge Summary (Signed)
Physician Discharge Summary  Patient ID: [name] MRN: [id number] DOB/AGE: 81-Apr-1935 81 y.o.  Admit date: 10/20/2016 Discharge date: 10/31/2016  Primary Care Physician:  Charolette Forward, MD   Discharge Diagnoses:   1. tongue cancer 2. Hypercalcemia 3. Aspiration pneumonia 4. Respiratory stridor 5. History of alcoholism and substance abuse 6. Dementia 7. Hypertension  8. Constipation 9. Steroid induced hyperglycemia   Present on Admission: . Cancer of anterior two-thirds of tongue ([place]) . Dementia due to another medical condition . Hypokalemia   Discharge Medications:  Allergies as of 10/31/2016      Reactions   Tuberculin Tests Other (See Comments)   UNSPECIFIED REACTION  Pt is allergic to PPD (The PPD skin test is a method used to diagnose silent (latent) tuberculosis (TB) infection. PPD stands for purified protein derivative.) Pt should receive chest x-rays instead.      Medication List    STOP taking these medications   HYDROcodone-acetaminophen 7.5-325 mg/15 ml solution Commonly known as:  HYCET   ibuprofen 200 MG tablet Commonly known as:  ADVIL,MOTRIN   lisinopril 10 MG tablet Commonly known as:  PRINIVIL,ZESTRIL   potassium chloride 10 MEQ tablet Commonly known as:  K-DUR,KLOR-CON     TAKE these medications   amLODipine 10 MG tablet Commonly known as:  NORVASC Take 1 tablet (10 mg total) by mouth daily.   fentaNYL 12 MCG/HR Commonly known as:  DURAGESIC - dosed mcg/hr Place 1 patch (12.5 mcg total) onto the skin every 3 (three) days. Start taking on:  7/67/2094   folic acid 1 MG tablet Commonly known as:  FOLVITE Take 1 mg by mouth daily.   glimepiride 1 MG tablet Commonly known as:  AMARYL Take 1 mg by mouth daily with breakfast.   hydrALAZINE 10 MG tablet Commonly known as:  APRESOLINE Take 1 tablet (10 mg total) by mouth every 8 (eight) hours.   latanoprost 0.005 % ophthalmic solution Commonly known as:   XALATAN Place 1 drop into both eyes at bedtime.   levothyroxine 88 MCG tablet Commonly known as:  SYNTHROID, LEVOTHROID Take 88 mcg by mouth daily before breakfast.   metFORMIN 500 MG 24 hr tablet Commonly known as:  GLUCOPHAGE-XR Take 500 mg by mouth daily after supper.   senna-docusate 8.6-50 MG tablet Commonly known as:  Senokot-S Take 1 tablet by mouth 2 (two) times daily.        Disposition and Follow-up:   Significant Diagnostic Studies:  X-ray Chest Pa And Lateral  Result Date: 10/20/2016 CLINICAL DATA:  83 year old female with dry cough. EXAM: CHEST  2 VIEW COMPARISON:  January 31, 2013 FINDINGS: The heart size is mildly enlarged. The hila and mediastinum are normal. No pulmonary nodules, masses, or focal infiltrates. No acute abnormalities. IMPRESSION: No active cardiopulmonary disease. Electronically Signed   By: Dorise Bullion III M.D   On: 10/20/2016 20:02   Ct Soft Tissue Neck W Contrast  Result Date: 10/19/2016 CLINICAL DATA:  83 y/o  F; EXAM: CT NECK WITH CONTRAST TECHNIQUE: Multidetector CT imaging of the neck was performed using the standard protocol following the bolus administration of intravenous contrast. CONTRAST:  78mL ISOVUE-300 IOPAMIDOL (ISOVUE-300) INJECTION 61% COMPARISON:  10/06/2016 PET-CT.  09/22/2016 CT of the neck. FINDINGS: Pharynx and larynx: Mass effect upon the airway which is displaced rightward and moderately narrowed at the level of the hypopharynx measuring 2.1 x 0.4 cm. Salivary glands: No inflammation, mass, or stone. Thyroid: Multiple thyroid nodules including a densely calcified nodule in right  lobe of thyroid are stable. Lymph nodes: Massive necrotic cervical lymphadenopathy of right 2,3,5 and left 2,3,4,5 lymph node stations markedly increased in size when compared with prior studies, for example a left-sided 2/3 level node measures 8.5 x 7.9 x 7.5 cm (AP x ML x CC series 2: Image 54 and 6:69), previously up to 5.3 cm. The necrotic lymph nodes  have ill-defined margins with surrounding fat stranding indicating extranodal extension. The large right upper cervical lymph node at angle of mandible invades the right masticator space and has an ill-defined margin with the right submandibular gland. Go the largest left upper cervical lymph node extends medially into the left carotid space, parapharyngeal space on, and has an ill-defined margin with both the left submandibular gland and parotid gland. Vascular: Left internal jugular vein thrombus, suspected tumor thrombus, now extends to the junction with the left brachiocephalic vein inferiorly and the craniocervical junction superiorly, progressed from prior studies. Limited intracranial: Negative. Visualized orbits: Negative. Mastoids and visualized paranasal sinuses: Clear. Skeleton: Stable cervical spondylosis. No high-grade bony canal stenosis. No destructive osseous lesion is identified. Upper chest: Stable left upper lobe 4 mm nodule (series 2, image 88). Other: None. IMPRESSION: 1. Massive necrotic cervical lymphadenopathy is increased in size. For example, the largest left-sided node measures up to 8.5 cm, previously 5.3 cm. 2. Findings of extranodal extension of lymphadenopathy and probable invasion of the left internal jugular vein now extending to junction with left brachiocephalic vein and craniocervical junction. 3. Mass effect on the airway which is displaced rightward and moderately narrow the level of hypopharynx. 4. Stable left upper lobe 4 mm nodule. Electronically Signed   By: Kristine Garbe M.D.   On: 10/19/2016 17:54   Nm Pet Image Initial (pi) Skull Base To Thigh  Addendum Date: 10/06/2016   ADDENDUM REPORT: 10/06/2016 14:08 ADDENDUM: The original report was by Dr. Van Clines. The following addendum is by Dr. Van Clines: I discussed these findings by telephone with Dr. Wilburn Cornelia at 1:50 p.m. on 10/06/2016. Electronically Signed   By: Van Clines M.D.   On:  10/06/2016 14:08   Result Date: 10/06/2016 CLINICAL DATA:  Initial treatment strategy for tongue cancer with lymph node metastatic disease. EXAM: NUCLEAR MEDICINE PET SKULL BASE TO THIGH TECHNIQUE: 5.4 mCi F-18 FDG was injected intravenously. Full-ring PET imaging was performed from the skull base to thigh after the radiotracer. CT data was obtained and used for attenuation correction and anatomic localization. FASTING BLOOD GLUCOSE:  Value: 165 mg/dl COMPARISON:  Multiple exams, including 09/22/2016 and 05/27/2011 CT scans FINDINGS: NECK A left cervical node spanning levels IIa, IIb, and III measures 6.8 cm in short axis and has a necrotic center with hypermetabolic rim, maximum SUV 17.1. This distorts and displaces the left lateral limb of the hyoid bone. There appears to be some less certain nodularity/adenopathy along the upper margin of this lesion which extends from tangential to the mandible down to tangential to the clavicle. Just below the angle of the right mandible there is a level Ib lymph node measuring 4.2 cm in short axis with maximum SUV 19.1. The medial margin of this lesion abuts the right tongue base in the posteromedial margin abuts the right side of the hyoid bone. Aside from this mass along the right side of the tongue base, I do not see a separate hypermetabolic tongue mass. A right station IIa lymph node measuring 1.8 cm in diameter has a maximum SUV of 15.1. The combination of this bulky adenopathy significantly  narrows the supraglottic trachea, causing a slit like appearance for example on image 28/4. 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 Low-density left internal jugular vein is severely effaced by the massive left neck adenopathy and probably thrombosed as well. Today' s exam was not performed with IV contrast and accordingly venous assessment is problematic. CHEST No hypermetabolic mediastinal or hilar nodes. 4 mm left upper lobe nodule image 15/8,  not currently hypermetabolic, no change from 35/45/6256. Coronary, aortic arch, and branch vessel atherosclerotic vascular disease. ABDOMEN/PELVIS No abnormal hypermetabolic activity within the liver, pancreas, adrenal glands, or spleen. No hypermetabolic lymph nodes in the abdomen or pelvis. Cholelithiasis. Hepatic morphology is somewhat lobular in raises the possibility of cirrhosis, correlate with clinical history. Trace perihepatic fluid along the right hepatic lobe for example image 102/4. No splenomegaly but there are splenic calcifications suggesting old granulomatous disease. Faint calcification along the slightly nodular left adrenal gland common no change from 3893, no hypermetabolic activity, thought to likely be benign. Aortoiliac atherosclerotic vascular disease. Sigmoid colon diverticulosis without active diverticulitis. Pelvic floor laxity. Hypermetabolic activity along the perineum is most compatible with incontinence. Trace amount of free pelvic fluid. Appendix normal. SKELETON No focal hypermetabolic activity to suggest skeletal metastasis. Prominent sclerosis and lucency along the sacroiliac joints compatible with considerable chronic sacroiliitis, but without current hypermetabolic activity. IMPRESSION: 1. Bulky hypermetabolic bilateral neck adenopathy compatible with malignancy. These nodes are so large that they significantly narrow the supraglottic airway and could threaten the airway if further swelling or nodal enlargement occurs. I do not see a separate tongue mass (the original density along the left anterior tongue shown on 04/22/2016 is no longer seen) although the dominant right level Ib lymph node abuts the right tongue base. 2. 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 3. Hepatic morphology raises the possibility of cirrhosis, correlate with  clinical history. Trace ascites. 4. 4 mm left upper lobe nodule is not changed from 09/22/2016, is not hypermetabolic but is below sensitive PET-CT size thresholds. 5. Other imaging findings of potential clinical significance: Coronary, aortic arch, and branch vessel atherosclerotic vascular disease. Aortoiliac atherosclerotic vascular disease. Cholelithiasis. Old granulomatous disease. Sigmoid diverticulosis. Pelvic floor laxity. Perineal activity compatible with urinary incontinence. Considerable chronic sacroiliitis. Radiology assistant personnel have been notified to put me in telephone contact with the referring physician or the referring physician's clinical representative in order to discuss these findings. Once this communication is established I will issue an addendum to this report for documentation purposes. Electronically Signed: By: Van Clines M.D. On: 10/06/2016 13:34   Ir Fluoro Guide Cv Line Right  Result Date: 10/22/2016 INDICATION: Patient with history of tongue cancer; central venous access requested for chemotherapy. EXAM: RIGHT UPPER EXTREMITY PICC LINE PLACEMENT WITH ULTRASOUND AND FLUOROSCOPIC GUIDANCE MEDICATIONS: None ANESTHESIA/SEDATION: None FLUOROSCOPY TIME:  Fluoroscopy Time:  12 seconds COMPLICATIONS: None immediate. PROCEDURE: The patient/family was advised of the possible risks and complications and agreed to undergo the procedure. The patient was then brought to the angiographic suite for the procedure. The right arm was prepped with chlorhexidine, draped in the usual sterile fashion using maximum barrier technique (cap and mask, sterile gown, sterile gloves, large sterile sheet, hand hygiene and cutaneous antisepsis) and infiltrated locally with 1% Lidocaine. Ultrasound demonstrated patency of the right basilic vein, and this was documented with an image. Under real-time ultrasound guidance, this vein was accessed  with a 21 gauge micropuncture needle and image  documentation was performed. A 0.018 wire was introduced in to the vein. Over this, a 5 [place] double lumen power injectable PICC was advanced to the lower SVC/right atrial junction. Fluoroscopy during the procedure and fluoro spot radiograph confirms appropriate catheter position. The catheter was flushed and covered with a sterile dressing. Catheter length: 36 cm IMPRESSION: Successful right arm power PICC line placement with ultrasound and fluoroscopic guidance. The catheter is ready for use. Read by: Rowe Robert, PA-C Electronically Signed   By: Sandi Mariscal M.D.   On: 10/22/2016 11:49   Ir US Guide Vasc Access Right  Result Date: 10/22/2016 INDICATION: Patient with history of tongue cancer; central venous access requested for chemotherapy. EXAM: RIGHT UPPER EXTREMITY PICC LINE PLACEMENT WITH ULTRASOUND AND FLUOROSCOPIC GUIDANCE MEDICATIONS: None ANESTHESIA/SEDATION: None FLUOROSCOPY TIME:  Fluoroscopy Time:  12 seconds COMPLICATIONS: None immediate. PROCEDURE: The patient/family was advised of the possible risks and complications and agreed to undergo the procedure. The patient was then brought to the angiographic suite for the procedure. The right arm was prepped with chlorhexidine, draped in the usual sterile fashion using maximum barrier technique (cap and mask, sterile gown, sterile gloves, large sterile sheet, hand hygiene and cutaneous antisepsis) and infiltrated locally with 1% Lidocaine. Ultrasound demonstrated patency of the right basilic vein, and this was documented with an image. Under real-time ultrasound guidance, this vein was accessed with a 21 gauge micropuncture needle and image documentation was performed. A 0.018 wire was introduced in to the vein. Over this, a 5 [place] double lumen power injectable PICC was advanced to the lower SVC/right atrial junction. Fluoroscopy during the procedure and fluoro spot radiograph confirms appropriate catheter position. The catheter was flushed and  covered with a sterile dressing. Catheter length: 36 cm IMPRESSION: Successful right arm power PICC line placement with ultrasound and fluoroscopic guidance. The catheter is ready for use. Read by: Rowe Robert, PA-C Electronically Signed   By: Sandi Mariscal M.D.   On: 10/22/2016 11:49   Dg Chest Port 1 View  Result Date: 10/23/2016 CLINICAL DATA:  Cough and shortness of breath EXAM: PORTABLE CHEST 1 VIEW COMPARISON:  Oct 20, 2016 FINDINGS: There is extensive airspace consolidation throughout much of the right lung. There is a degree of superimposed pleural effusion on the right. Left lung is clear. Heart is upper normal in size with pulmonary vascularity normal. There is aortic atherosclerosis. No evident adenopathy. Central catheter tip is at cavoatrial junction. No pneumothorax. Calcification is noted in the right carotid artery with what appears to be dilatation in the right carotid artery measuring 1.3 cm. IMPRESSION: Extensive airspace consolidation throughout much the right lung with superimposed right pleural effusion. Suspect widespread right lung pneumonia. Left lung clear. Stable cardiac silhouette. Aortic atherosclerosis. Calcification in the right carotid artery with apparent aneurysmal dilatation, calcified, in the right carotid artery. Electronically Signed   By: Lowella Grip III M.D.   On: 10/23/2016 07:20   Dg Swallowing Func-speech Pathology  Result Date: 10/21/2016 Objective Swallowing Evaluation: Type of Study: MBS-Modified Barium Swallow Study Patient Details Name: TISHINA LOWN MRN: [id number] Date of Birth: December 28, 1933 Today's Date: 10/21/2016 Time: SLP Start Time (ACUTE ONLY): 1457-SLP Stop Time (ACUTE ONLY): 6546 SLP Time Calculation (min) (ACUTE ONLY): 20 min Past Medical History: Past Medical History: Diagnosis Date . Abdominal pain  . Blood dyscrasia   HYPOKALEMIA . Blood transfusion  . COPD (chronic obstructive pulmonary disease) ([place])  . Dementia   pathologic  aging and dementia  per daughter . Diabetes mellitus   Type II . Heart murmur  . History of blood transfusion  . Hypertension  . Hypothyroid  . Neuropathy  . Substance abuse   ETOH with dementia and prior encephalopathy and withdrawal . Tongue cancer ([place]) 05/13/2016 . Tricuspid regurgitation 2012  mild . Urinary tract infection  Past Surgical History: Past Surgical History: Procedure Laterality Date . ABDOMINAL HYSTERECTOMY    1970's . BREAST SURGERY Left   Lumpectomy . COLONOSCOPY   . ESOPHAGOGASTRODUODENOSCOPY  05/17/2011  Procedure: ESOPHAGOGASTRODUODENOSCOPY (EGD);  Surgeon: Lafayette Dragon, MD;  [facility] ENDOSCOPY;  Service: Endoscopy;  Laterality: N/A; . EXCISION OF TONGUE LESION Left 05/13/2016  Procedure: WIDE LOCAL EXCISION OF LEFT LATERAL TONGUE CANCER;  Surgeon: Jerrell Belfast, MD;  Location: [place];  Service: ENT;  Laterality: Left; . EXCISION OF TONGUE LESION Left 06/12/2016  Procedure: RE-EXCISION OF LEFT  TONGUE CANCER;  Surgeon: Jerrell Belfast, MD;  Location: [place];  Service: ENT;  Laterality: Left; . EYE SURGERY Bilateral   Cataract . TONGUE SURGERY  05/13/2016  WIDE LOCAL EXCISION OF LEFT LATERAL TONGUE CANCER (Left) with Local Soft Tissue Reconstruction  . TONSILLECTOMY AND ADENOIDECTOMY   HPI: Pt is an 83 yo female adm to [facility] with h/o cancer of anterior 2/3 tongue s/p surgery 05/2016 and now has rapidly growing tumor in her left neck and jaw region for which she is scheduled to have radiation.   Massive neck/cervical lymphadenopathy 8.5 cm with invasion of IJ with tumor thrombus per MD note.   Pt with left supraglottic larynx/pharynx, right submandibular - node 4 cm.  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  Swallow evaluation ordered.   Pt denies ever having xray swallow study done before.  Subjective: pt awake in chair Assessment / Plan / Recommendation CHL IP CLINICAL IMPRESSIONS 10/21/2016 Clinical Impression Pt tested in lateral  and A-P view.  Pt presents with mild oropharyngeal dysphagia without aspiration of any consistency tested (thin, nectar, pudding, cracker and barium tablet). Decreased oral manipulation due to pt's lingual surgery noted.  Pharyngeal swallow was overall strong without significant residuals/aspiration.  Very trace laryngeal penetration of thin liquid secondary to decreased timing of laryngeal closure cleared indpendently.  Of note, pt voice was clear prior to and during MBS - she reports wet voice occuring AFTER meals.  Pt is likely having aspiration of secretions but was protective of her airway with MBS.   Using live imaging, educated pt to findings/recommendations.   When Md indicates, recommend advance to dys3/thin due to oral deficits with strict precautions. SlP to follow briefly for pt education/po toelrance.  Thanks for this consult.  SLP Visit Diagnosis Dysphagia, oropharyngeal phase (R13.12) Attention and concentration deficit following -- Frontal lobe and executive function deficit following -- Impact on safety and function Mild aspiration risk   CHL IP TREATMENT RECOMMENDATION 10/21/2016 Treatment Recommendations Therapy as outlined in treatment plan below   Prognosis 10/21/2016 Prognosis for Safe Diet Advancement Fair Barriers to Reach Goals Severity of deficits;Time post onset Barriers/Prognosis Comment -- CHL IP DIET RECOMMENDATION 10/21/2016 SLP Diet Recommendations -- Liquid Administration via Straw Medication Administration Whole meds with liquid Compensations Slow rate;Small sips/bites;Follow solids with liquid Postural Changes Remain semi-upright after after feeds/meals (Comment);Seated upright at 90 degrees   CHL IP OTHER RECOMMENDATIONS 10/21/2016 Recommended Consults -- Oral Care Recommendations Oral care BID Other Recommendations --   CHL  IP FOLLOW UP RECOMMENDATIONS 10/21/2016 Follow up Recommendations (No Data)   CHL IP FREQUENCY AND DURATION 10/21/2016 Speech Therapy Frequency (ACUTE ONLY) min 1 x/week  Treatment Duration 1 week      CHL IP ORAL PHASE 10/21/2016 Oral Phase Impaired Oral - Pudding Teaspoon -- Oral - Pudding Cup -- Oral - Honey Teaspoon -- Oral - Honey Cup -- Oral - Nectar Teaspoon -- Oral - Nectar Cup Weak lingual manipulation Oral - Nectar Straw Weak lingual manipulation Oral - Thin Teaspoon Weak lingual manipulation Oral - Thin Cup Weak lingual manipulation Oral - Thin Straw Weak lingual manipulation Oral - Puree Weak lingual manipulation;Reduced posterior propulsion Oral - Mech Soft Weak lingual manipulation;Impaired mastication;Reduced posterior propulsion Oral - Regular -- Oral - Multi-Consistency -- Oral - Pill Weak lingual manipulation;Reduced posterior propulsion Oral Phase - Comment --  CHL IP PHARYNGEAL PHASE 10/21/2016 Pharyngeal Phase Impaired Pharyngeal- Pudding Teaspoon -- Pharyngeal -- Pharyngeal- Pudding Cup -- Pharyngeal -- Pharyngeal- Honey Teaspoon -- Pharyngeal -- Pharyngeal- Honey Cup -- Pharyngeal -- Pharyngeal- Nectar Teaspoon -- Pharyngeal -- Pharyngeal- Nectar Cup WFL Pharyngeal -- Pharyngeal- Nectar Straw WFL Pharyngeal -- Pharyngeal- Thin Teaspoon WFL Pharyngeal -- Pharyngeal- Thin Cup WFL Pharyngeal -- Pharyngeal- Thin Straw WFL Pharyngeal -- Pharyngeal- Puree WFL Pharyngeal -- Pharyngeal- Mechanical Soft WFL Pharyngeal -- Pharyngeal- Regular -- Pharyngeal -- Pharyngeal- Multi-consistency -- Pharyngeal -- Pharyngeal- Pill WFL Pharyngeal -- Pharyngeal Comment --  CHL IP CERVICAL ESOPHAGEAL PHASE 10/21/2016 Cervical Esophageal Phase Impaired Pudding Teaspoon -- Pudding Cup -- Honey Teaspoon -- Honey Cup -- Nectar Teaspoon -- Nectar Cup -- Nectar Straw -- Thin Teaspoon -- Thin Cup -- Thin Straw -- Puree -- Mechanical Soft -- Regular -- Multi-consistency -- Pill -- Cervical Esophageal Comment barium tablet appeared to lodge at mid-esophagus without awareness, further bolus of pudding did not faciliate clearance however multiple boluses of liquids effective;  radiologist is not  present and MBS is not designed to examine below UES - if MD deems indicated, pt may benefit from dedicated esophageal evaluation No flowsheet data found. Luanna Salk, [facility] SLP [phone]               Discharge Laboratory Values: CBC Latest Ref Rng & Units 10/30/2016 10/29/2016 10/27/2016  WBC 4.0 - 10.5 K/uL 66.0(HH) 65.2(HH) 37.8(H)  Hemoglobin 12.0 - 15.0 g/dL 10.6(L) 11.8(L) 11.2(L)  Hematocrit 36.0 - 46.0 % 33.7(L) 36.9 35.9(L)  Platelets 150 - 400 K/uL 212 256 200    CMP Latest Ref Rng & Units 10/30/2016 10/29/2016 10/28/2016  Glucose 65 - 99 mg/dL 137(H) 182(H) 134(H)  BUN 6 - 20 mg/dL 19 22(H) 13  Creatinine 0.44 - 1.00 mg/dL 0.64 0.80 0.60  Sodium 135 - 145 mmol/L 137 138 136  Potassium 3.5 - 5.1 mmol/L 3.8 4.0 3.1(L)  Chloride 101 - 111 mmol/L 100(L) 95(L) 91(L)  CO2 22 - 32 mmol/L 29 31 34(H)  Calcium 8.9 - 10.3 mg/dL 8.4(L) 9.1 8.5(L)  Total Protein 6.5 - 8.1 g/dL 6.5 7.2 7.1  Total Bilirubin 0.3 - 1.2 mg/dL 0.4 0.5 0.5  Alkaline Phos 38 - 126 U/L 78 88 84  AST 15 - 41 U/L 15 22 20   ALT 14 - 54 U/L 11(L) 14 13(L)    Brief H and P: For complete details please refer to admission H and P, but in brief,patient was admitted and hypercalcemia was tested. Multidisciplinary evaluation with ENT, rad onc and medical oncology was done and it was felt that she should be treated with cisplatin alone because  radiation could cause more respiratory distress. She tolerated 2 doses of cisplatin very well. She was felt to be high risk for trach. She was also treated for aspiration pneumonia  Once maximal improvement in her respiratory  status was obtained, she was evaluated and felt to be a candidate for SNF. However she initially refused but once Family was here, she agreed to it  Physical Exam at Discharge: BP (!) 155/55 (BP Location: Left Arm)   Pulse 89   Temp 98 F (36.7 C) (Oral)   Resp 16   Ht 4\' 11"  (1.499 m)   Wt 107 lb (48.5 kg)   LMP 05/17/1983   SpO2 98%   BMI 21.61 kg/m   GENERAL:alert difficult top communicate due to stridor SKIN: skin color, texture, turgor are normal, no rashes or significant lesions EYES: normal, Conjunctiva are pink and non-injected, sclera clear OROPHARYNX:whitish discoloration on the tongue but it not thrush  NECK: massive neck tumors LUNGS: difficult to auscultate because of stridor. Patient refuses to wear oxygen by nasal cannula. HEART: regular rate & rhythm and no murmurs and no lower extremity edema ABDOMEN:abdomen soft, non-tender and normal bowel sounds NEURO: difficult to test but patient is moving all extremities   Hospital Course:  Active Problems:   Hypokalemia   DM (diabetes mellitus) ([place])   Cancer of anterior two-thirds of tongue ([place])   Dementia due to another medical condition   Metastasis to lymph nodes ([facility])   Hypercalcemia  Tongue cancer ([place]) The patient has complex past medical history including renal failure on hemodialysis (which has since resolved), severe substance abuse especially with alcoholism (has been abstinent)and background history of dementia. I have read a lot of the past medicalrecords, in collaboration with history of fromher daughter who appears to be the principal caregiver. In essence, this patient has rapidly progressive disease in the neck with impeding airway compromise without further treatment. There is a concern about possible airway compromise with airway swelling with radiation treatment After extensive discussion at the ENT tumor board, the decision is made for her to undergo chemotherapy in the hospital first in an attempt to shrink down the tumor PICC line was placed on 10/22/2016 with reduced dose cisplatin given on 10/22/16 and 10/29/16  Continue aggressive supportive care  Malignant hypercalcemia, resolved This has improvedon IV fluid resuscitation, IV dexamethasone, nasal calcitonin and Lasix  I have stop IV fluids due to recent fluid overload. Lasix was also  discontinued I will stop dexamethasone and calcitonin She received 1 dose of IV Zometa on 10/24/2016  Severe hypertension, improved Improving on antihypertensives I will increase the dose of amlodipine and add hydralazine scheduled. Plan to avoid atenolol due to recent bradycardia and avoid lisinopril due to risk of kidney injury  Imminent airway compromise with mild stridor and excessive airway secretion, resolved Due to disease progression, improved since chemo and dexamethasone ENT service had evaluated her imaging and felt that tracheostomy at this point is not possible Excessive airway secretion has improved. Will stop nebulizers  Cancer pain The patient is unreliable and will not ask for pain medicine I recommend low-dose fentanyl patch  Aspiration pneumonia Formal swallow evaluation, result available, risk of aspiration Repeat chest x-ray on 10/23/2016 show evidence of pneumonia, likely due to aspiration pneumonia We will consult pharmacist and switch her antibiotic coverage to Zosyn Total treatment for 7 days, last day today, will stop Will also add continuous oxygen and Nebulizers  Leukocytosis with necrotic tumor with serosanguineous discharge/cellulitis Likely multifactorial due to  pneumonia and steroids Continue antibiotic coverage andcontinue dry dressing changes  Severe hypokalemia, intermittent Could be due to potassium loss from chemotherapy of Zosyn We will replace intravenously  Severe constipation, resolved Will continueSenokot along with MiraLAX and lactulose  Poor oral intake We will consult dietitian  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 After extensive review of prior evaluation by neurologist from [facility], I felt that the patient indeed may have age related dementia but appears to be coping well with  aggressive social support Physical therapist felt that she may benefit from skilled nursing facility I spoke with the daughter and she agreed The patient is not in the state of mind when she can make medical decision  CODE STATUS DNR  Goals of care, counseling/discussion The patient is aware shehas incurable disease and treatment is strictly palliative. We discussed importance of Advanced Directives and Living will. Her daughter appears to be the healthcare medical power of attorney I discussed goals of care with the patient's daughter and she agree with the plan of care as above  Discharge planning The patient had multiple reason to be admitted, namely malignant hypercalcemia, severe hypokalemia, leukocytosis with draining necrotic tumor and imminent airway compromise. The risk of not admitting her would be risk of death Pending SNF placement We will consult social worker and care management for skilled nursing facility placement  Physical Exam at Discharge: BP (!) 141/57 (BP Location: Left Arm)   Pulse 69   Temp 97.9 F (36.6 C) (Oral)   Resp 14   Ht 4\' 11"  (1.499 m)   Wt 107 lb (48.5 kg)   LMP 05/17/1983   SpO2 99%   BMI 21.61 kg/m   Active Problems:   Cancer of anterior two-thirds of tongue ([facility])   Dementia due to another medical condition   Hypokalemia   DM (diabetes mellitus) ([place])   Metastasis to lymph nodes ([place])   Hypercalcemia   Diet:  Regular  Activity:  As tolerated  Condition at Discharge:   stable    Signed Rulon Eisenmenger, MD  10/31/2016, 6:42 PM

## 2016-11-05 ENCOUNTER — Other Ambulatory Visit (HOSPITAL_BASED_OUTPATIENT_CLINIC_OR_DEPARTMENT_OTHER): Payer: Medicare Other

## 2016-11-05 ENCOUNTER — Telehealth: Payer: Self-pay

## 2016-11-05 ENCOUNTER — Telehealth: Payer: Self-pay | Admitting: *Deleted

## 2016-11-05 ENCOUNTER — Ambulatory Visit: Payer: Medicare Other

## 2016-11-05 ENCOUNTER — Other Ambulatory Visit: Payer: Self-pay | Admitting: Hematology and Oncology

## 2016-11-05 ENCOUNTER — Telehealth: Payer: Self-pay | Admitting: Oncology

## 2016-11-05 ENCOUNTER — Ambulatory Visit (HOSPITAL_BASED_OUTPATIENT_CLINIC_OR_DEPARTMENT_OTHER): Payer: Medicare Other | Admitting: Hematology and Oncology

## 2016-11-05 ENCOUNTER — Telehealth: Payer: Self-pay | Admitting: Hematology and Oncology

## 2016-11-05 ENCOUNTER — Encounter: Payer: Self-pay | Admitting: Hematology and Oncology

## 2016-11-05 DIAGNOSIS — E876 Hypokalemia: Secondary | ICD-10-CM | POA: Diagnosis not present

## 2016-11-05 DIAGNOSIS — G893 Neoplasm related pain (acute) (chronic): Secondary | ICD-10-CM | POA: Diagnosis not present

## 2016-11-05 DIAGNOSIS — Z452 Encounter for adjustment and management of vascular access device: Secondary | ICD-10-CM

## 2016-11-05 DIAGNOSIS — C023 Malignant neoplasm of anterior two-thirds of tongue, part unspecified: Secondary | ICD-10-CM | POA: Diagnosis not present

## 2016-11-05 DIAGNOSIS — F0281 Dementia in other diseases classified elsewhere with behavioral disturbance: Secondary | ICD-10-CM | POA: Diagnosis not present

## 2016-11-05 DIAGNOSIS — C770 Secondary and unspecified malignant neoplasm of lymph nodes of head, face and neck: Secondary | ICD-10-CM

## 2016-11-05 DIAGNOSIS — D72825 Bandemia: Secondary | ICD-10-CM | POA: Diagnosis not present

## 2016-11-05 DIAGNOSIS — E43 Unspecified severe protein-calorie malnutrition: Secondary | ICD-10-CM | POA: Diagnosis not present

## 2016-11-05 DIAGNOSIS — F02818 Dementia in other diseases classified elsewhere, unspecified severity, with other behavioral disturbance: Secondary | ICD-10-CM

## 2016-11-05 LAB — COMPREHENSIVE METABOLIC PANEL
ALT: 8 U/L (ref 0–55)
AST: 7 U/L (ref 5–34)
Albumin: 2.9 g/dL — ABNORMAL LOW (ref 3.5–5.0)
Alkaline Phosphatase: 98 U/L (ref 40–150)
Anion Gap: 9 mEq/L (ref 3–11)
BUN: 24.7 mg/dL (ref 7.0–26.0)
CHLORIDE: 102 meq/L (ref 98–109)
CO2: 26 mEq/L (ref 22–29)
Calcium: 9.3 mg/dL (ref 8.4–10.4)
Creatinine: 0.8 mg/dL (ref 0.6–1.1)
EGFR: 77 mL/min/{1.73_m2} — ABNORMAL LOW (ref >90)
GLUCOSE: 286 mg/dL — AB (ref 70–140)
POTASSIUM: 3.2 meq/L — AB (ref 3.5–5.1)
SODIUM: 138 meq/L (ref 136–145)
Total Bilirubin: 0.34 mg/dL (ref 0.20–1.20)
Total Protein: 6.4 g/dL (ref 6.4–8.3)

## 2016-11-05 LAB — CBC WITH DIFFERENTIAL/PLATELET
BASO%: 0.1 % (ref 0.0–2.0)
BASOS ABS: 0 10*3/uL (ref 0.0–0.1)
EOS%: 2.7 % (ref 0.0–7.0)
Eosinophils Absolute: 0.7 10*3/uL — ABNORMAL HIGH (ref 0.0–0.5)
HCT: 28 % — ABNORMAL LOW (ref 34.8–46.6)
HGB: 9.1 g/dL — ABNORMAL LOW (ref 11.6–15.9)
LYMPH%: 6.7 % — ABNORMAL LOW (ref 14.0–49.7)
MCH: 26.8 pg (ref 25.1–34.0)
MCHC: 32.5 g/dL (ref 31.5–36.0)
MCV: 82.6 fL (ref 79.5–101.0)
MONO#: 1.3 10*3/uL — ABNORMAL HIGH (ref 0.1–0.9)
MONO%: 5.1 % (ref 0.0–14.0)
NEUT#: 20.9 10*3/uL — ABNORMAL HIGH (ref 1.5–6.5)
NEUT%: 85.4 % — AB (ref 38.4–76.8)
Platelets: 156 10*3/uL (ref 145–400)
RBC: 3.39 10*6/uL — AB (ref 3.70–5.45)
RDW: 16.1 % — ABNORMAL HIGH (ref 11.2–14.5)
WBC: 24.5 10*3/uL — ABNORMAL HIGH (ref 3.9–10.3)
lymph#: 1.6 10*3/uL (ref 0.9–3.3)

## 2016-11-05 LAB — MAGNESIUM: MAGNESIUM: 1.6 mg/dL (ref 1.5–2.5)

## 2016-11-05 MED ORDER — SODIUM CHLORIDE 0.9% FLUSH
10.0000 mL | INTRAVENOUS | Status: DC | PRN
  Administered 2016-11-05: 10 mL via INTRAVENOUS
  Filled 2016-11-05: qty 10

## 2016-11-05 MED ORDER — HEPARIN SOD (PORK) LOCK FLUSH 100 UNIT/ML IV SOLN
500.0000 [IU] | Freq: Once | INTRAVENOUS | Status: DC
  Filled 2016-11-05: qty 5

## 2016-11-05 NOTE — Progress Notes (Signed)
[place] OFFICE PROGRESS NOTE  Patient Care Team: Charolette Forward, MD as PCP - General (Cardiology) Eppie Gibson, MD as Attending Physician (Radiation Oncology) Heath Lark, MD as Consulting Physician (Hematology and Oncology) Leota Sauers, RN as Oncology Nurse Navigator  SUMMARY OF ONCOLOGIC HISTORY:   Cancer of anterior two-thirds of tongue ([name])   04/15/2016 Initial Diagnosis    The patient presents for evaluation of a left anterior tongue ulcer. She reports a several month history of gradually enlarging ulcerated lesion on the left anterior tongue, pain when chewing. She reports some intermittent bleeding and irritation. The patient reports weight loss over the last several months with difficulty chewing       04/22/2016 Imaging    CT neck: 1. 17 x 10 x 20 mm hyperdense mass lesion in the left anterior tongue associated with the area of ulceration. 2. No significant invasion of the deep tongue musculature or floor of mouth. 3. No significant metastatic disease. 4. Degenerative changes within the cervical spine, worse on the right. 5. Calcified right thyroid nodule. Recommend further evaluation with thyroid ultrasound. If patient is clinically hyperthyroid, consider nuclear medicine thyroid uptake and scan.       05/14/2016 Surgery    Wide local excision, anterior 2/3 left tongue with reconstruction.      05/14/2016 Pathology Results    1. Tongue, biopsy, Anterior deep margin left - BENIGN SALIVARY GLAND-TYPE TISSUE AND SKELETAL MUSCLE. - THERE IS NO EVIDENCE OF MALIGNANCY. 2. Tongue, resection for tumor, Left lateral - INVASIVE SQUAMOUS CELL CARCINOMA, WELL DIFFERENTIATED, SPANNING 3.7 CM. - INVASIVE CARCINOMA IS BROADLY PRESENT AT THE DEEP MARGIN OF SPECIMEN #2. - PERINEURAL INVASION IS IDENTIFIED. - SEE ONCOLOGY TABLE BELOW.      06/12/2016 Surgery    Revision left hemiglossectomy      06/12/2016 Pathology Results    1. Tongue, resection for tumor,  left lateral - BENIGN SQUAMOUS LINED MUCOSA WITH FOREIGN BODY GIANT CELL REACTION, CONSISTENT WITH PRIOR SURGICAL PROCEDURE. - THERE IS NO EVIDENCE OF MALIGNANCY. 2. Tongue, biopsy, deep margin, left - BENIGN SKELETAL MUSCLE. - THERE IS NO EVIDENCE OF MALIGNANCY.      09/22/2016 Imaging    Ct chest: 1. 4 mm left upper lobe partially calcified pulmonary nodule, suspect granuloma. No other pulmonary nodules are visualized. No significant mediastinal or hilar adenopathy. 2. Indeterminate 14 mm mass in the left adrenal gland. Further evaluation with adrenal CT or MRI may be obtained for further evaluation. 3. Thrombosis of the left internal jugular vein. Ill-defined soft tissue thickening and stranding anterior to the left jugular vein and around the inferior left sternocleidomastoid muscle. 4. Gallstones      09/22/2016 Imaging    CT neck: . 1. New bulky bilateral necrotic lymphadenopathy. Left IJ chain node measures up to 53 mm and invades the IJ with tumor thrombus extending to the subclavian confluence. This node also invades the supraglottic larynx and left sternocleidomastoid. 2. Oral tongue primary with possible residual/recurrent tumor along the right margin.       10/06/2016 PET scan    1. Bulky hypermetabolic bilateral neck adenopathy compatible with malignancy. These nodes are so large that they significantly narrow the supraglottic airway and could threaten the airway if further swelling or nodal enlargement occurs. I do not see a separate tongue mass (the original density along the left anterior tongue shown on 04/22/2016 is no longer seen) although the dominant right level Ib lymph node abuts the right tongue base. 2. 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 3. Hepatic morphology raises the possibility of cirrhosis, correlate with clinical history.  Trace ascites. 4. 4 mm left upper lobe nodule is not changed from 09/22/2016, is not hypermetabolic but is below sensitive PET-CT size thresholds. 5. Other imaging findings of potential clinical significance:  Coronary, aortic arch, and branch vessel atherosclerotic vascular disease. Aortoiliac atherosclerotic vascular disease. Cholelithiasis. Old granulomatous disease. Sigmoid diverticulosis. Pelvic floor laxity. Perineal activity compatible with urinary incontinence. Considerable chronic sacroiliitis      10/19/2016 Imaging    CT neck 1. Massive necrotic cervical lymphadenopathy is increased in size. For example, the largest left-sided node measures up to 8.5 cm, previously 5.3 cm. 2. Findings of extranodal extension of lymphadenopathy and probable invasion of the left internal jugular vein now extending to junction with left brachiocephalic vein and craniocervical junction. 3. Mass effect on the airway which is displaced rightward and moderately narrow the level of hypopharynx. 4. Stable left upper lobe 4 mm nodule.       10/20/2016 - 10/31/2016 Hospital Admission    The patient was hospitalized for management of impeding airway obstruction, hypercalcemia and leukocytosis.  She received 2 doses of chemotherapy while hospitalized with improvement of white count and hypercalcemia.  Her airway obstruction is also improve after chemotherapy       INTERVAL HISTORY: Please see below for problem oriented charting. She returns with her daughter. The patient has been complaining nonstop going back to skilled nursing facility She insists that she can take care of herself even though her daughter felt differently. The patient has declined to return here for further chemotherapy and she wishes to just be left alone She have mild cough occasionally. No fever or chills. The wound from the right neck continues to drain on a regular basis She denies recent nausea or vomiting. She has recent weight loss  since I saw her.  REVIEW OF SYSTEMS:   Constitutional: Denies fevers, chills or abnormal weight loss Eyes: Denies blurriness of vision Ears, nose, mouth, throat, and face: Denies mucositis or sore throat Cardiovascular: Denies palpitation, chest discomfort or lower extremity swelling Gastrointestinal:  Denies nausea, heartburn or change in bowel habits Skin: Denies abnormal skin rashes Neurological:Denies numbness, tingling or new weaknesses Behavioral/Psych: Mood is stable, no new changes  All other systems were reviewed with the patient and are negative.  I have reviewed the past medical history, past surgical history, social history and family history with the patient and they are unchanged from previous note.  ALLERGIES:  is allergic to tuberculin tests.  MEDICATIONS:  Current Outpatient Prescriptions  Medication Sig Dispense Refill  . amLODipine (NORVASC) 10 MG tablet Take 1 tablet (10 mg total) by mouth daily. 30 tablet 0  . fentaNYL (DURAGESIC - DOSED MCG/HR) 12 MCG/HR Place 1 patch (12.5 mcg total) onto the skin every 3 (three) days. 5 patch 0  . folic acid (FOLVITE) 1 MG tablet Take 1 mg by mouth daily.    Marland Kitchen glimepiride (AMARYL) 1 MG tablet Take 1 mg by mouth daily with breakfast.    . hydrALAZINE (APRESOLINE) 10 MG tablet Take 1 tablet (10 mg total) by mouth every 8 (eight) hours. 30 tablet 0  . latanoprost (XALATAN) 0.005 % ophthalmic solution Place 1 drop into both eyes at bedtime.    Marland Kitchen levothyroxine (SYNTHROID, LEVOTHROID) 88 MCG tablet Take 88 mcg by mouth daily before breakfast.    .  metFORMIN (GLUCOPHAGE-XR) 500 MG 24 hr tablet Take 500 mg by mouth daily after supper.     . senna-docusate (SENOKOT-S) 8.6-50 MG tablet Take 1 tablet by mouth 2 (two) times daily. 60 tablet 0   No current facility-administered medications for this visit.     PHYSICAL EXAMINATION: ECOG PERFORMANCE STATUS: 1 - Symptomatic but completely ambulatory  Vitals:   11/05/16 1124  BP: 134/87   Pulse: 67  Resp: 18  Temp: 99 F (37.2 C)   Filed Weights   11/05/16 1124  Weight: 96 lb 4.8 oz (43.7 kg)    GENERAL:alert, no distress and comfortable SKIN: skin color, texture, turgor are normal, no rashes or significant lesions EYES: normal, Conjunctiva are pink and non-injected, sclera clear OROPHARYNX:no exudate, no erythema and lips, buccal mucosa, and tongue normal  NECK: supple, thyroid normal size, non-tender, without nodularity LYMPH: She has large bilateral neck lymphadenopathy.  I changed the dressing on the right neck mass, smaller with less drainage LUNGS: clear to auscultation and percussion with normal breathing effort HEART: regular rate & rhythm and no murmurs and no lower extremity edema ABDOMEN:abdomen soft, non-tender and normal bowel sounds Musculoskeletal:no cyanosis of digits and no clubbing  NEURO: alert & oriented x 3 with mild dysarthria from prior surgery, no focal motor/sensory deficits  LABORATORY DATA:  I have reviewed the data as listed    Component Value Date/Time   NA 138 11/05/2016 1032   K 3.2 (L) 11/05/2016 1032   CL 100 (L) 10/30/2016 0500   CO2 26 11/05/2016 1032   GLUCOSE 286 (H) 11/05/2016 1032   BUN 24.7 11/05/2016 1032   CREATININE 0.8 11/05/2016 1032   CALCIUM 9.3 11/05/2016 1032   PROT 6.4 11/05/2016 1032   ALBUMIN 2.9 (L) 11/05/2016 1032   AST 7 11/05/2016 1032   ALT 8 11/05/2016 1032   ALKPHOS 98 11/05/2016 1032   BILITOT 0.34 11/05/2016 1032   GFRNONAA >60 10/30/2016 0500   GFRAA >60 10/30/2016 0500    No results found for: SPEP, UPEP  Lab Results  Component Value Date   WBC 24.5 (H) 11/05/2016   NEUTROABS 20.9 (H) 11/05/2016   HGB 9.1 (L) 11/05/2016   HCT 28.0 (L) 11/05/2016   MCV 82.6 11/05/2016   PLT 156 11/05/2016      Chemistry      Component Value Date/Time   NA 138 11/05/2016 1032   K 3.2 (L) 11/05/2016 1032   CL 100 (L) 10/30/2016 0500   CO2 26 11/05/2016 1032   BUN 24.7 11/05/2016 1032    CREATININE 0.8 11/05/2016 1032      Component Value Date/Time   CALCIUM 9.3 11/05/2016 1032   ALKPHOS 98 11/05/2016 1032   AST 7 11/05/2016 1032   ALT 8 11/05/2016 1032   BILITOT 0.34 11/05/2016 1032       RADIOGRAPHIC STUDIES: I have personally reviewed the radiological images as listed and agreed with the findings in the report. X-ray Chest Pa And Lateral  Result Date: 10/20/2016 CLINICAL DATA:  83 year old female with dry cough. EXAM: CHEST  2 VIEW COMPARISON:  January 31, 2013 FINDINGS: The heart size is mildly enlarged. The hila and mediastinum are normal. No pulmonary nodules, masses, or focal infiltrates. No acute abnormalities. IMPRESSION: No active cardiopulmonary disease. Electronically Signed   By: Dorise Bullion III M.D   On: 10/20/2016 20:02   Ct Soft Tissue Neck W Contrast  Result Date: 10/19/2016 CLINICAL DATA:  83 y/o  F; EXAM: CT NECK WITH CONTRAST  TECHNIQUE: Multidetector CT imaging of the neck was performed using the standard protocol following the bolus administration of intravenous contrast. CONTRAST:  6mL ISOVUE-300 IOPAMIDOL (ISOVUE-300) INJECTION 61% COMPARISON:  10/06/2016 PET-CT.  09/22/2016 CT of the neck. FINDINGS: Pharynx and larynx: Mass effect upon the airway which is displaced rightward and moderately narrowed at the level of the hypopharynx measuring 2.1 x 0.4 cm. Salivary glands: No inflammation, mass, or stone. Thyroid: Multiple thyroid nodules including a densely calcified nodule in right lobe of thyroid are stable. Lymph nodes: Massive necrotic cervical lymphadenopathy of right 2,3,5 and left 2,3,4,5 lymph node stations markedly increased in size when compared with prior studies, for example a left-sided 2/3 level node measures 8.5 x 7.9 x 7.5 cm (AP x ML x CC series 2: Image 54 and 6:69), previously up to 5.3 cm. The necrotic lymph nodes have ill-defined margins with surrounding fat stranding indicating extranodal extension. The large right upper cervical  lymph node at angle of mandible invades the right masticator space and has an ill-defined margin with the right submandibular gland. Go the largest left upper cervical lymph node extends medially into the left carotid space, parapharyngeal space on, and has an ill-defined margin with both the left submandibular gland and parotid gland. Vascular: Left internal jugular vein thrombus, suspected tumor thrombus, now extends to the junction with the left brachiocephalic vein inferiorly and the craniocervical junction superiorly, progressed from prior studies. Limited intracranial: Negative. Visualized orbits: Negative. Mastoids and visualized paranasal sinuses: Clear. Skeleton: Stable cervical spondylosis. No high-grade bony canal stenosis. No destructive osseous lesion is identified. Upper chest: Stable left upper lobe 4 mm nodule (series 2, image 88). Other: None. IMPRESSION: 1. Massive necrotic cervical lymphadenopathy is increased in size. For example, the largest left-sided node measures up to 8.5 cm, previously 5.3 cm. 2. Findings of extranodal extension of lymphadenopathy and probable invasion of the left internal jugular vein now extending to junction with left brachiocephalic vein and craniocervical junction. 3. Mass effect on the airway which is displaced rightward and moderately narrow the level of hypopharynx. 4. Stable left upper lobe 4 mm nodule. Electronically Signed   By: Kristine Garbe M.D.   On: 10/19/2016 17:54   Ir Fluoro Guide Cv Line Right  Result Date: 10/22/2016 INDICATION: Patient with history of tongue cancer; central venous access requested for chemotherapy. EXAM: RIGHT UPPER EXTREMITY PICC LINE PLACEMENT WITH ULTRASOUND AND FLUOROSCOPIC GUIDANCE MEDICATIONS: None ANESTHESIA/SEDATION: None FLUOROSCOPY TIME:  Fluoroscopy Time:  12 seconds COMPLICATIONS: None immediate. PROCEDURE: The patient/family was advised of the possible risks and complications and agreed to undergo the  procedure. The patient was then brought to the angiographic suite for the procedure. The right arm was prepped with chlorhexidine, draped in the usual sterile fashion using maximum barrier technique (cap and mask, sterile gown, sterile gloves, large sterile sheet, hand hygiene and cutaneous antisepsis) and infiltrated locally with 1% Lidocaine. Ultrasound demonstrated patency of the right basilic vein, and this was documented with an image. Under real-time ultrasound guidance, this vein was accessed with a 21 gauge micropuncture needle and image documentation was performed. A 0.018 wire was introduced in to the vein. Over this, a 5 [place] double lumen power injectable PICC was advanced to the lower SVC/right atrial junction. Fluoroscopy during the procedure and fluoro spot radiograph confirms appropriate catheter position. The catheter was flushed and covered with a sterile dressing. Catheter length: 36 cm IMPRESSION: Successful right arm power PICC line placement with ultrasound and fluoroscopic guidance. The catheter is ready  for use. Read by: Rowe Robert, PA-C Electronically Signed   By: Sandi Mariscal M.D.   On: 10/22/2016 11:49   Ir US Guide Vasc Access Right  Result Date: 10/22/2016 INDICATION: Patient with history of tongue cancer; central venous access requested for chemotherapy. EXAM: RIGHT UPPER EXTREMITY PICC LINE PLACEMENT WITH ULTRASOUND AND FLUOROSCOPIC GUIDANCE MEDICATIONS: None ANESTHESIA/SEDATION: None FLUOROSCOPY TIME:  Fluoroscopy Time:  12 seconds COMPLICATIONS: None immediate. PROCEDURE: The patient/family was advised of the possible risks and complications and agreed to undergo the procedure. The patient was then brought to the angiographic suite for the procedure. The right arm was prepped with chlorhexidine, draped in the usual sterile fashion using maximum barrier technique (cap and mask, sterile gown, sterile gloves, large sterile sheet, hand hygiene and cutaneous antisepsis) and  infiltrated locally with 1% Lidocaine. Ultrasound demonstrated patency of the right basilic vein, and this was documented with an image. Under real-time ultrasound guidance, this vein was accessed with a 21 gauge micropuncture needle and image documentation was performed. A 0.018 wire was introduced in to the vein. Over this, a 5 [place] double lumen power injectable PICC was advanced to the lower SVC/right atrial junction. Fluoroscopy during the procedure and fluoro spot radiograph confirms appropriate catheter position. The catheter was flushed and covered with a sterile dressing. Catheter length: 36 cm IMPRESSION: Successful right arm power PICC line placement with ultrasound and fluoroscopic guidance. The catheter is ready for use. Read by: Rowe Robert, PA-C Electronically Signed   By: Sandi Mariscal M.D.   On: 10/22/2016 11:49   Dg Chest Port 1 View  Result Date: 10/23/2016 CLINICAL DATA:  Cough and shortness of breath EXAM: PORTABLE CHEST 1 VIEW COMPARISON:  Oct 20, 2016 FINDINGS: There is extensive airspace consolidation throughout much of the right lung. There is a degree of superimposed pleural effusion on the right. Left lung is clear. Heart is upper normal in size with pulmonary vascularity normal. There is aortic atherosclerosis. No evident adenopathy. Central catheter tip is at cavoatrial junction. No pneumothorax. Calcification is noted in the right carotid artery with what appears to be dilatation in the right carotid artery measuring 1.3 cm. IMPRESSION: Extensive airspace consolidation throughout much the right lung with superimposed right pleural effusion. Suspect widespread right lung pneumonia. Left lung clear. Stable cardiac silhouette. Aortic atherosclerosis. Calcification in the right carotid artery with apparent aneurysmal dilatation, calcified, in the right carotid artery. Electronically Signed   By: Lowella Grip III M.D.   On: 10/23/2016 07:20   Dg Swallowing Func-speech  Pathology  Result Date: 10/21/2016 Objective Swallowing Evaluation: Type of Study: MBS-Modified Barium Swallow Study Patient Details Name: IRIDIANA FONNER MRN: [id number] Date of Birth: Nov 17, 1933 Today's Date: 10/21/2016 Time: SLP Start Time (ACUTE ONLY): 1457-SLP Stop Time (ACUTE ONLY): 6283 SLP Time Calculation (min) (ACUTE ONLY): 20 min Past Medical History: Past Medical History: Diagnosis Date . Abdominal pain  . Blood dyscrasia   HYPOKALEMIA . Blood transfusion  . COPD (chronic obstructive pulmonary disease) ([place])  . Dementia   pathologic aging and dementia per daughter . Diabetes mellitus   Type II . Heart murmur  . History of blood transfusion  . Hypertension  . Hypothyroid  . Neuropathy  . Substance abuse   ETOH with dementia and prior encephalopathy and withdrawal . Tongue cancer ([place]) 05/13/2016 . Tricuspid regurgitation 2012  mild . Urinary tract infection  Past Surgical History: Past Surgical History: Procedure Laterality Date . ABDOMINAL HYSTERECTOMY    1970's . BREAST SURGERY Left  Lumpectomy . COLONOSCOPY   . ESOPHAGOGASTRODUODENOSCOPY  05/17/2011  Procedure: ESOPHAGOGASTRODUODENOSCOPY (EGD);  Surgeon: Lafayette Dragon, MD;  Location: [facility] ENDOSCOPY;  Service: Endoscopy;  Laterality: N/A; . EXCISION OF TONGUE LESION Left 05/13/2016  Procedure: WIDE LOCAL EXCISION OF LEFT LATERAL TONGUE CANCER;  Surgeon: Jerrell Belfast, MD;  Location: [place];  Service: ENT;  Laterality: Left; . EXCISION OF TONGUE LESION Left 06/12/2016  Procedure: RE-EXCISION OF LEFT  TONGUE CANCER;  Surgeon: Jerrell Belfast, MD;  Location: [place];  Service: ENT;  Laterality: Left; . EYE SURGERY Bilateral   Cataract . TONGUE SURGERY  05/13/2016  WIDE LOCAL EXCISION OF LEFT LATERAL TONGUE CANCER (Left) with Local Soft Tissue Reconstruction  . TONSILLECTOMY AND ADENOIDECTOMY   HPI: Pt is an 83 yo female adm to [facility] with h/o cancer of anterior 2/3 tongue s/p surgery 05/2016 and now has rapidly growing tumor in her left neck and jaw region for  which she is scheduled to have radiation.   Massive neck/cervical lymphadenopathy 8.5 cm with invasion of IJ with tumor thrombus per MD note.   Pt with left supraglottic larynx/pharynx, right submandibular - node 4 cm.  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  Swallow evaluation ordered.   Pt denies ever having xray swallow study done before.  Subjective: pt awake in chair Assessment / Plan / Recommendation CHL IP CLINICAL IMPRESSIONS 10/21/2016 Clinical Impression Pt tested in lateral and A-P view.  Pt presents with mild oropharyngeal dysphagia without aspiration of any consistency tested (thin, nectar, pudding, cracker and barium tablet). Decreased oral manipulation due to pt's lingual surgery noted.  Pharyngeal swallow was overall strong without significant residuals/aspiration.  Very trace laryngeal penetration of thin liquid secondary to decreased timing of laryngeal closure cleared indpendently.  Of note, pt voice was clear prior to and during MBS - she reports wet voice occuring AFTER meals.  Pt is likely having aspiration of secretions but was protective of her airway with MBS.   Using live imaging, educated pt to findings/recommendations.   When Md indicates, recommend advance to dys3/thin due to oral deficits with strict precautions. SlP to follow briefly for pt education/po toelrance.  Thanks for this consult.  SLP Visit Diagnosis Dysphagia, oropharyngeal phase (R13.12) Attention and concentration deficit following -- Frontal lobe and executive function deficit following -- Impact on safety and function Mild aspiration risk   CHL IP TREATMENT RECOMMENDATION 10/21/2016 Treatment Recommendations Therapy as outlined in treatment plan below   Prognosis 10/21/2016 Prognosis for Safe Diet Advancement Fair Barriers to Reach Goals Severity of deficits;Time post onset Barriers/Prognosis Comment -- CHL IP DIET RECOMMENDATION 10/21/2016 SLP Diet  Recommendations -- Liquid Administration via Straw Medication Administration Whole meds with liquid Compensations Slow rate;Small sips/bites;Follow solids with liquid Postural Changes Remain semi-upright after after feeds/meals (Comment);Seated upright at 90 degrees   CHL IP OTHER RECOMMENDATIONS 10/21/2016 Recommended Consults -- Oral Care Recommendations Oral care BID Other Recommendations --   CHL IP FOLLOW UP RECOMMENDATIONS 10/21/2016 Follow up Recommendations (No Data)   CHL IP FREQUENCY AND DURATION 10/21/2016 Speech Therapy Frequency (ACUTE ONLY) min 1 x/week Treatment Duration 1 week      CHL IP ORAL PHASE 10/21/2016 Oral Phase Impaired Oral - Pudding Teaspoon -- Oral - Pudding Cup -- Oral - Honey Teaspoon -- Oral - Honey Cup -- Oral - Nectar Teaspoon -- Oral - Nectar Cup Weak lingual manipulation Oral - Nectar Straw Weak lingual manipulation  Oral - Thin Teaspoon Weak lingual manipulation Oral - Thin Cup Weak lingual manipulation Oral - Thin Straw Weak lingual manipulation Oral - Puree Weak lingual manipulation;Reduced posterior propulsion Oral - Mech Soft Weak lingual manipulation;Impaired mastication;Reduced posterior propulsion Oral - Regular -- Oral - Multi-Consistency -- Oral - Pill Weak lingual manipulation;Reduced posterior propulsion Oral Phase - Comment --  CHL IP PHARYNGEAL PHASE 10/21/2016 Pharyngeal Phase Impaired Pharyngeal- Pudding Teaspoon -- Pharyngeal -- Pharyngeal- Pudding Cup -- Pharyngeal -- Pharyngeal- Honey Teaspoon -- Pharyngeal -- Pharyngeal- Honey Cup -- Pharyngeal -- Pharyngeal- Nectar Teaspoon -- Pharyngeal -- Pharyngeal- Nectar Cup WFL Pharyngeal -- Pharyngeal- Nectar Straw WFL Pharyngeal -- Pharyngeal- Thin Teaspoon WFL Pharyngeal -- Pharyngeal- Thin Cup WFL Pharyngeal -- Pharyngeal- Thin Straw WFL Pharyngeal -- Pharyngeal- Puree WFL Pharyngeal -- Pharyngeal- Mechanical Soft WFL Pharyngeal -- Pharyngeal- Regular -- Pharyngeal -- Pharyngeal- Multi-consistency -- Pharyngeal -- Pharyngeal-  Pill WFL Pharyngeal -- Pharyngeal Comment --  CHL IP CERVICAL ESOPHAGEAL PHASE 10/21/2016 Cervical Esophageal Phase Impaired Pudding Teaspoon -- Pudding Cup -- Honey Teaspoon -- Honey Cup -- Nectar Teaspoon -- Nectar Cup -- Nectar Straw -- Thin Teaspoon -- Thin Cup -- Thin Straw -- Puree -- Mechanical Soft -- Regular -- Multi-consistency -- Pill -- Cervical Esophageal Comment barium tablet appeared to lodge at mid-esophagus without awareness, further bolus of pudding did not faciliate clearance however multiple boluses of liquids effective;  radiologist is not present and MBS is not designed to examine below UES - if MD deems indicated, pt may benefit from dedicated esophageal evaluation No flowsheet data found. Luanna Salk, [facility] S[facility] [phone]               ASSESSMENT & PLAN:  Cancer of anterior two-thirds of tongue ([name]) Overall, she appears to have responded well to systemic chemotherapy with improvement of the white count, reduced size of the lymphadenopathy, resolution of hypercalcemia and pain. However, it is very challenging to have the patient agreed to come back here once a week with blood draw and chemotherapy. I estimated she would need at least 3-6 months of treatment before the tumor size have reduced to a reasonable level so that radiation therapy can be accomplished without significant problems We will arrange transportation back and forth from skilled nursing facility to come back here for evaluation on a weekly basis  Metastasis to lymph nodes [facility]) She has necrotic draining right neck mass from the lymph node I placed some clean dressing change. The drainage is improving. Continue daily dressing changes.  Cancer associated pain Her pain has resolved while on treatment.  She would continue to take pain medicine as needed.  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 She has displayed some behavioral  disturbances recently, refusing to return back to nursing home and to come back to receive chemotherapy. I have a long discussion with the patient's daughter, who is her dedicated healthcare medical power of attorney It is challenging to ensure compliance in the future Her daughter needs to be present at most times during treatment so that her mother can continue to receive chemotherapy  Leukocytosis This is due to presence of necrotic tumor. Continue dressing changes and observation only  Severe malnutrition ([place]) She has recent weight loss and protein calorie malnutrition I encouraged the patient to increase oral supplement as tolerated  Hypokalemia Due to chemotherapy.  Observe for now and replace intravenously during treatment   No orders of the defined types were placed in  this encounter.  All questions were answered. The patient knows to call the clinic with any problems, questions or concerns. No barriers to learning was detected. I spent 25 minutes counseling the patient face to face. The total time spent in the appointment was 40 minutes and more than 50% was on counseling and review of test results     Heath Lark, MD 11/05/2016 2:41 PM

## 2016-11-05 NOTE — Assessment & Plan Note (Signed)
The patient is declared mentally incompetent to make medical decision. She has displayed some behavioral disturbances recently, refusing to return back to nursing home and to come back to receive chemotherapy. I have a long discussion with the patient's daughter, who is her dedicated healthcare medical power of attorney It is challenging to ensure compliance in the future Her daughter needs to be present at most times during treatment so that her mother can continue to receive chemotherapy

## 2016-11-05 NOTE — Assessment & Plan Note (Signed)
Her pain has resolved while on treatment.  She would continue to take pain medicine as needed.

## 2016-11-05 NOTE — Telephone Encounter (Signed)
Daughter Seth Bake is questioning if the pt needs to keep the neuro rehab appt on 5/22 with [facility]. This appt was made in April per Dr Isidore Moos. She needs to know so she can make arrangements with SNF for transportation or not. Andrea's phone [phone].

## 2016-11-05 NOTE — Assessment & Plan Note (Signed)
She has necrotic draining right neck mass from the lymph node I placed some clean dressing change. The drainage is improving. Continue daily dressing changes.

## 2016-11-05 NOTE — Assessment & Plan Note (Signed)
She has recent weight loss and protein calorie malnutrition I encouraged the patient to increase oral supplement as tolerated

## 2016-11-05 NOTE — Telephone Encounter (Signed)
Appointments scheduled per 11/05/16 los. Patient was given a copy of the AVS report and appointment schdeule per 11/05/16 los.

## 2016-11-05 NOTE — Assessment & Plan Note (Signed)
This is due to presence of necrotic tumor. Continue dressing changes and observation only

## 2016-11-05 NOTE — Assessment & Plan Note (Signed)
Due to chemotherapy.  Observe for now and replace intravenously during treatment

## 2016-11-05 NOTE — Telephone Encounter (Signed)
Oncology Nurse Navigator Documentation  Spoke with Ms. Lick's dtr, Seth Bake, regarding tomorrow's chemo and future appts.    I explained her mother will routinely have labs and appt with Dr. Alvy Bimler on Thursday's and chemo on Fridays.  I noted Dr. Alvy Bimler asks that she be present for these appts to facilitate her mother's compliance as well as temper her behavior.  She indicated she can be present for Thursday appts and most of the chemo appts except for next Friday, May 25, which is her daughter's high school graduation.  She noted that she ask if staff from [facility] can accompany her mother as her mother seems very comfortable and pleased with her caregivers.  I explained that [place] appts for next Tuesday to see Nutrition, PT and Neuro Rehab ([facility]) have been cancelled.  Seth Bake understands I will communicate with [facility] regarding appts.  [name], RN, BSN, [place] [facility] Oncology Nurse [place] at [place] [phone]

## 2016-11-05 NOTE — Telephone Encounter (Signed)
No.  Has been cancelled, dtr aware.

## 2016-11-05 NOTE — Telephone Encounter (Signed)
Added chemo for 5/18 thru 6/15. Patient in office and will get new appointments at visit with NG.

## 2016-11-05 NOTE — Assessment & Plan Note (Signed)
Overall, she appears to have responded well to systemic chemotherapy with improvement of the white count, reduced size of the lymphadenopathy, resolution of hypercalcemia and pain. However, it is very challenging to have the patient agreed to come back here once a week with blood draw and chemotherapy. I estimated she would need at least 3-6 months of treatment before the tumor size have reduced to a reasonable level so that radiation therapy can be accomplished without significant problems We will arrange transportation back and forth from skilled nursing facility to come back here for evaluation on a weekly basis

## 2016-11-05 NOTE — Patient Instructions (Signed)
PICC Home Guide °A peripherally inserted central catheter (PICC) is a long, thin, flexible tube that is inserted into a vein in the upper arm. It is a form of intravenous (IV) access. It is considered to be a "central" line because the tip of the PICC ends in a large vein in your chest. This large vein is called the superior vena cava (SVC). The PICC tip ends in the SVC because there is a lot of blood flow in the SVC. This allows medicines and IV fluids to be quickly distributed throughout the body. The PICC is inserted using a sterile technique by a specially trained nurse or physician. After the PICC is inserted, a chest X-ray exam is done to be sure it is in the correct place. °A PICC may be placed for different reasons, such as: °· To give medicines and liquid nutrition that can only be given through a central line. Examples are: °? Certain antibiotic treatments. °? Chemotherapy. °? Total parenteral nutrition (TPN). °· To take frequent blood samples. °· To give IV fluids and blood products. °· If there is difficulty placing a peripheral intravenous (PIV) catheter. ° °If taken care of properly, a PICC can remain in place for several months. A PICC can also allow a person to go home from the hospital early. Medicine and PICC care can be managed at home by a family member or home health care team. °What problems can happen when I have a PICC? °Problems with a PICC can occasionally occur. These may include the following: °· A blood clot (thrombus) forming in or at the tip of the PICC. This can cause the PICC to become clogged. A clot-dissolving medicine called tissue plasminogen activator (tPA) can be given through the PICC to help break up the clot. °· Inflammation of the vein (phlebitis) in which the PICC is placed. Signs of inflammation may include redness, pain at the insertion site, red streaks, or being able to feel a "cord" in the vein where the PICC is located. °· Infection in the PICC or at the insertion  site. Signs of infection may include fever, chills, redness, swelling, or pus drainage from the PICC insertion site. °· PICC movement (malposition). The PICC tip may move from its original position due to excessive physical activity, forceful coughing, sneezing, or vomiting. °· A break or cut in the PICC. It is important to not use scissors near the PICC. °· Nerve or tendon irritation or injury during PICC insertion. ° °What should I keep in mind about activities when I have a PICC? °· You may bend your arm and move it freely. If your PICC is near or at the bend of your elbow, avoid activity with repeated motion at the elbow. °· Rest at home for the remainder of the day following PICC line insertion. °· Avoid lifting heavy objects as instructed by your health care provider. °· Avoid using a crutch with the arm on the same side as your PICC. You may need to use a walker. °What should I know about my PICC dressing? °· Keep your PICC bandage (dressing) clean and dry to prevent infection. °? Ask your health care provider when you may shower. Ask your health care provider to teach you how to wrap the PICC when you do take a shower. °· Change the PICC dressing as instructed by your health care provider. °· Change your PICC dressing if it becomes loose or wet. °What should I know about PICC care? °· Check the PICC insertion   site daily for leakage, redness, swelling, or pain. °· Do not take a bath, swim, or use hot tubs when you have a PICC. Cover PICC line with clear plastic wrap and tape to keep it dry while showering. °· Flush the PICC as directed by your health care provider. Let your health care provider know right away if the PICC is difficult to flush or does not flush. Do not use force to flush the PICC. °· Do not use a syringe that is less than 10 mL to flush the PICC. °· Never pull or tug on the PICC. °· Avoid blood pressure checks on the arm with the PICC. °· Keep your PICC identification card with you at all  times. °· Do not take the PICC out yourself. Only a trained clinical professional should remove the PICC. °Get help right away if: °· Your PICC is accidentally pulled all the way out. If this happens, cover the insertion site with a bandage or gauze dressing. Do not throw the PICC away. Your health care provider will need to inspect it. °· Your PICC was tugged or pulled and has partially come out. Do not  push the PICC back in. °· There is any type of drainage, redness, or swelling where the PICC enters the skin. °· You cannot flush the PICC, it is difficult to flush, or the PICC leaks around the insertion site when it is flushed. °· You hear a "flushing" sound when the PICC is flushed. °· You have pain, discomfort, or numbness in your arm, shoulder, or jaw on the same side as the PICC. °· You feel your heart "racing" or skipping beats. °· You notice a hole or tear in the PICC. °· You develop chills or a fever. °This information is not intended to replace advice given to you by your health care provider. Make sure you discuss any questions you have with your health care provider. °Document Released: 12/13/2002 Document Revised: 12/27/2015 Document Reviewed: 03/31/2013 °Elsevier Interactive Patient Education © 2017 [employer] ° °

## 2016-11-05 NOTE — Telephone Encounter (Signed)
Oncology Nurse Navigator Documentation  Spoke with patient's [name]'s nursing supervisor, confirmed awareness of her appts at [name] this morning starting with 10:15 lab.  [name], RN, BSN, [place] [facility] Oncology Nurse [place] at [place] [phone]

## 2016-11-06 ENCOUNTER — Ambulatory Visit (HOSPITAL_BASED_OUTPATIENT_CLINIC_OR_DEPARTMENT_OTHER): Payer: Medicare Other

## 2016-11-06 ENCOUNTER — Telehealth: Payer: Self-pay | Admitting: Hematology and Oncology

## 2016-11-06 ENCOUNTER — Encounter: Payer: Self-pay | Admitting: *Deleted

## 2016-11-06 VITALS — BP 123/74 | HR 93 | Temp 99.2°F | Resp 19

## 2016-11-06 DIAGNOSIS — Z5111 Encounter for antineoplastic chemotherapy: Secondary | ICD-10-CM | POA: Diagnosis not present

## 2016-11-06 DIAGNOSIS — C023 Malignant neoplasm of anterior two-thirds of tongue, part unspecified: Secondary | ICD-10-CM | POA: Diagnosis not present

## 2016-11-06 MED ORDER — PALONOSETRON HCL INJECTION 0.25 MG/5ML
0.2500 mg | Freq: Once | INTRAVENOUS | Status: AC
  Administered 2016-11-06: 0.25 mg via INTRAVENOUS

## 2016-11-06 MED ORDER — SODIUM CHLORIDE 0.9 % IV SOLN
Freq: Once | INTRAVENOUS | Status: AC
  Administered 2016-11-06: 10:00:00 via INTRAVENOUS

## 2016-11-06 MED ORDER — HEPARIN SOD (PORK) LOCK FLUSH 100 UNIT/ML IV SOLN
250.0000 [IU] | Freq: Once | INTRAVENOUS | Status: AC | PRN
  Administered 2016-11-06: 250 [IU]
  Filled 2016-11-06: qty 5

## 2016-11-06 MED ORDER — PALONOSETRON HCL INJECTION 0.25 MG/5ML
INTRAVENOUS | Status: AC
  Filled 2016-11-06: qty 5

## 2016-11-06 MED ORDER — HYDROMORPHONE HCL 4 MG/ML IJ SOLN
1.0000 mg | Freq: Once | INTRAMUSCULAR | Status: AC
  Administered 2016-11-06: 1 mg via INTRAVENOUS

## 2016-11-06 MED ORDER — SODIUM CHLORIDE 0.9% FLUSH
3.0000 mL | INTRAVENOUS | Status: DC | PRN
  Administered 2016-11-06: 3 mL via INTRAVENOUS
  Filled 2016-11-06: qty 10

## 2016-11-06 MED ORDER — SODIUM CHLORIDE 0.9 % IV SOLN
Freq: Once | INTRAVENOUS | Status: AC
  Administered 2016-11-06: 14:00:00 via INTRAVENOUS
  Filled 2016-11-06: qty 5

## 2016-11-06 MED ORDER — POTASSIUM CHLORIDE 2 MEQ/ML IV SOLN
Freq: Once | INTRAVENOUS | Status: AC
  Administered 2016-11-06: 10:00:00 via INTRAVENOUS
  Filled 2016-11-06: qty 10

## 2016-11-06 MED ORDER — PROCHLORPERAZINE MALEATE 10 MG PO TABS: 10.0000 mg | ORAL_TABLET | Freq: Four times a day (QID) | ORAL | 0 refills | Status: AC | PRN

## 2016-11-06 MED ORDER — ONDANSETRON 8 MG PO TBDP: 8.0000 mg | ORAL_TABLET | Freq: Three times a day (TID) | ORAL | 1 refills | Status: AC | PRN

## 2016-11-06 MED ORDER — HYDROMORPHONE HCL 4 MG/ML IJ SOLN
INTRAMUSCULAR | Status: AC
  Filled 2016-11-06: qty 1

## 2016-11-06 MED ORDER — SODIUM CHLORIDE 0.9 % IV SOLN
30.0000 mg/m2 | Freq: Once | INTRAVENOUS | Status: AC
  Administered 2016-11-06: 43 mg via INTRAVENOUS
  Filled 2016-11-06: qty 43

## 2016-11-06 NOTE — Telephone Encounter (Signed)
Appointments per 5/18 schedule message completed. Spoke with infusion - patient will be given new schedule to take back to the center with her  Today. Also informed navigator re new appointment date/time.

## 2016-11-06 NOTE — Telephone Encounter (Signed)
[name], thanks for the update FYI: I sent scheduling msg to see her back next Wed 5/23 at 11 am. She probably needs to arrive at 10 am to check, get labs and port flus. Please contact Ritta Slot

## 2016-11-06 NOTE — Progress Notes (Signed)
Oncology Nurse Navigator Documentation  Met [name] shortly after she arrived to Infusion for chemo. She was alert, relaxed, properly engaged in conversation, compliant with procedure underway. She understands she can contact me if needed.  [name], RN, BSN, [place] [facility] Oncology Nurse [place] at [place] [phone]

## 2016-11-06 NOTE — Patient Instructions (Signed)
[facility] Discharge Instructions for Patients Receiving Chemotherapy  Today you received the following chemotherapy agents: Cisplatin  To help prevent nausea and vomiting after your treatment, we encourage you to take your nausea medication as prescribed by your physician.    If you develop nausea and vomiting that is not controlled by your nausea medication, call the clinic.   BELOW ARE SYMPTOMS THAT SHOULD BE REPORTED IMMEDIATELY:  *FEVER GREATER THAN 100.5 F  *CHILLS WITH OR WITHOUT FEVER  NAUSEA AND VOMITING THAT IS NOT CONTROLLED WITH YOUR NAUSEA MEDICATION  *UNUSUAL SHORTNESS OF BREATH  *UNUSUAL BRUISING OR BLEEDING  TENDERNESS IN MOUTH AND THROAT WITH OR WITHOUT PRESENCE OF ULCERS  *URINARY PROBLEMS  *BOWEL PROBLEMS  UNUSUAL RASH Items with * indicate a potential emergency and should be followed up as soon as possible.  Feel free to call the clinic you have any questions or concerns. The clinic phone number is [phone].  Please show the [place] at check-in to the Emergency Department and triage nurse.

## 2016-11-06 NOTE — Progress Notes (Signed)
1315- Patient had received about 3 hours of her cisplatin fluids; per Dr. Alvy Bimler, no additional fluids needed after completion of cisplatin.  1615- dressing to right lower jaw was changed prior to discharged.

## 2016-11-10 ENCOUNTER — Ambulatory Visit: Payer: Medicare Other

## 2016-11-10 ENCOUNTER — Encounter: Payer: Medicare Other | Admitting: Nutrition

## 2016-11-11 ENCOUNTER — Inpatient Hospital Stay (HOSPITAL_COMMUNITY): Payer: Medicare Other

## 2016-11-11 ENCOUNTER — Inpatient Hospital Stay (HOSPITAL_COMMUNITY)
Admission: AD | Admit: 2016-11-11 | Discharge: 2016-11-11 | Disposition: A | Discharge status: Home / Self Care | Payer: Medicare Other | Source: Ambulatory Visit | Attending: Hematology and Oncology | Admitting: Hematology and Oncology

## 2016-11-11 ENCOUNTER — Encounter (HOSPITAL_COMMUNITY): Payer: Self-pay

## 2016-11-11 ENCOUNTER — Inpatient Hospital Stay (HOSPITAL_COMMUNITY)
Admission: AD | Admit: 2016-11-11 | Discharge: 2016-11-12 | DRG: 146 | Disposition: A | Discharge status: Other Acute Inpatient Hospital | Payer: Medicare Other | Source: Ambulatory Visit | Attending: Internal Medicine | Admitting: Internal Medicine

## 2016-11-11 ENCOUNTER — Other Ambulatory Visit (HOSPITAL_BASED_OUTPATIENT_CLINIC_OR_DEPARTMENT_OTHER): Payer: Medicare Other

## 2016-11-11 ENCOUNTER — Encounter: Payer: Medicare Other | Admitting: Hematology and Oncology

## 2016-11-11 VITALS — BP 86/59 | HR 112 | Temp 98.6°F | Resp 21 | Ht 59.0 in | Wt 89.6 lb

## 2016-11-11 DIAGNOSIS — E1140 Type 2 diabetes mellitus with diabetic neuropathy, unspecified: Secondary | ICD-10-CM | POA: Diagnosis present

## 2016-11-11 DIAGNOSIS — I959 Hypotension, unspecified: Secondary | ICD-10-CM | POA: Diagnosis not present

## 2016-11-11 DIAGNOSIS — Z515 Encounter for palliative care: Secondary | ICD-10-CM | POA: Diagnosis present

## 2016-11-11 DIAGNOSIS — R471 Dysarthria and anarthria: Secondary | ICD-10-CM

## 2016-11-11 DIAGNOSIS — Z66 Do not resuscitate: Secondary | ICD-10-CM | POA: Diagnosis present

## 2016-11-11 DIAGNOSIS — J988 Other specified respiratory disorders: Secondary | ICD-10-CM

## 2016-11-11 DIAGNOSIS — E441 Mild protein-calorie malnutrition: Secondary | ICD-10-CM | POA: Diagnosis not present

## 2016-11-11 DIAGNOSIS — F0390 Unspecified dementia without behavioral disturbance: Secondary | ICD-10-CM

## 2016-11-11 DIAGNOSIS — C779 Secondary and unspecified malignant neoplasm of lymph node, unspecified: Secondary | ICD-10-CM | POA: Diagnosis present

## 2016-11-11 DIAGNOSIS — E43 Unspecified severe protein-calorie malnutrition: Secondary | ICD-10-CM | POA: Diagnosis present

## 2016-11-11 DIAGNOSIS — G893 Neoplasm related pain (acute) (chronic): Secondary | ICD-10-CM

## 2016-11-11 DIAGNOSIS — L0390 Cellulitis, unspecified: Secondary | ICD-10-CM | POA: Diagnosis not present

## 2016-11-11 DIAGNOSIS — Z85810 Personal history of malignant neoplasm of tongue: Secondary | ICD-10-CM

## 2016-11-11 DIAGNOSIS — G629 Polyneuropathy, unspecified: Secondary | ICD-10-CM | POA: Diagnosis present

## 2016-11-11 DIAGNOSIS — E039 Hypothyroidism, unspecified: Secondary | ICD-10-CM | POA: Diagnosis present

## 2016-11-11 DIAGNOSIS — E1165 Type 2 diabetes mellitus with hyperglycemia: Secondary | ICD-10-CM | POA: Diagnosis present

## 2016-11-11 DIAGNOSIS — Z7984 Long term (current) use of oral hypoglycemic drugs: Secondary | ICD-10-CM

## 2016-11-11 DIAGNOSIS — R1310 Dysphagia, unspecified: Secondary | ICD-10-CM

## 2016-11-11 DIAGNOSIS — J690 Pneumonitis due to inhalation of food and vomit: Secondary | ICD-10-CM | POA: Diagnosis not present

## 2016-11-11 DIAGNOSIS — Z7952 Long term (current) use of systemic steroids: Secondary | ICD-10-CM

## 2016-11-11 DIAGNOSIS — F0280 Dementia in other diseases classified elsewhere without behavioral disturbance: Secondary | ICD-10-CM | POA: Diagnosis present

## 2016-11-11 DIAGNOSIS — R000 Tachycardia, unspecified: Secondary | ICD-10-CM | POA: Diagnosis not present

## 2016-11-11 DIAGNOSIS — C023 Malignant neoplasm of anterior two-thirds of tongue, part unspecified: Secondary | ICD-10-CM

## 2016-11-11 DIAGNOSIS — C029 Malignant neoplasm of tongue, unspecified: Secondary | ICD-10-CM | POA: Diagnosis present

## 2016-11-11 DIAGNOSIS — N19 Unspecified kidney failure: Secondary | ICD-10-CM

## 2016-11-11 DIAGNOSIS — F17210 Nicotine dependence, cigarettes, uncomplicated: Secondary | ICD-10-CM | POA: Diagnosis present

## 2016-11-11 DIAGNOSIS — I10 Essential (primary) hypertension: Secondary | ICD-10-CM | POA: Diagnosis present

## 2016-11-11 DIAGNOSIS — E119 Type 2 diabetes mellitus without complications: Secondary | ICD-10-CM

## 2016-11-11 DIAGNOSIS — E876 Hypokalemia: Secondary | ICD-10-CM | POA: Diagnosis present

## 2016-11-11 DIAGNOSIS — J449 Chronic obstructive pulmonary disease, unspecified: Secondary | ICD-10-CM | POA: Diagnosis present

## 2016-11-11 DIAGNOSIS — E0965 Drug or chemical induced diabetes mellitus with hyperglycemia: Secondary | ICD-10-CM | POA: Diagnosis not present

## 2016-11-11 DIAGNOSIS — Z79899 Other long term (current) drug therapy: Secondary | ICD-10-CM | POA: Diagnosis not present

## 2016-11-11 DIAGNOSIS — D72829 Elevated white blood cell count, unspecified: Secondary | ICD-10-CM | POA: Diagnosis not present

## 2016-11-11 DIAGNOSIS — R061 Stridor: Secondary | ICD-10-CM | POA: Diagnosis not present

## 2016-11-11 DIAGNOSIS — D72825 Bandemia: Secondary | ICD-10-CM | POA: Diagnosis not present

## 2016-11-11 LAB — CBC WITH DIFFERENTIAL/PLATELET
BASO%: 0.2 % (ref 0.0–2.0)
Basophils Absolute: 0.1 10*3/uL (ref 0.0–0.1)
EOS ABS: 0.8 10*3/uL — AB (ref 0.0–0.5)
EOS%: 2.1 % (ref 0.0–7.0)
HEMATOCRIT: 33.2 % — AB (ref 34.8–46.6)
HEMOGLOBIN: 10.9 g/dL — AB (ref 11.6–15.9)
LYMPH%: 5.2 % — ABNORMAL LOW (ref 14.0–49.7)
MCH: 27 pg (ref 25.1–34.0)
MCHC: 32.8 g/dL (ref 31.5–36.0)
MCV: 82.4 fL (ref 79.5–101.0)
MONO#: 1.8 10*3/uL — AB (ref 0.1–0.9)
MONO%: 4.9 % (ref 0.0–14.0)
NEUT%: 87.6 % — ABNORMAL HIGH (ref 38.4–76.8)
NEUTROS ABS: 31.8 10*3/uL — AB (ref 1.5–6.5)
NRBC: 0 % (ref 0–0)
PLATELETS: 263 10*3/uL (ref 145–400)
RBC: 4.03 10*6/uL (ref 3.70–5.45)
RDW: 16.3 % — AB (ref 11.2–14.5)
WBC: 36.3 10*3/uL — AB (ref 3.9–10.3)
lymph#: 1.9 10*3/uL (ref 0.9–3.3)

## 2016-11-11 LAB — COMPREHENSIVE METABOLIC PANEL
ALBUMIN: 3 g/dL — AB (ref 3.5–5.0)
ALT: 7 U/L (ref 0–55)
ANION GAP: 19 meq/L — AB (ref 3–11)
AST: 6 U/L (ref 5–34)
Alkaline Phosphatase: 127 U/L (ref 40–150)
BUN: 28 mg/dL — ABNORMAL HIGH (ref 7.0–26.0)
CALCIUM: 9.2 mg/dL (ref 8.4–10.4)
CO2: 27 meq/L (ref 22–29)
CREATININE: 1.1 mg/dL (ref 0.6–1.1)
Chloride: 99 mEq/L (ref 98–109)
EGFR: 55 mL/min/{1.73_m2} — AB (ref >90)
Glucose: 230 mg/dl — ABNORMAL HIGH (ref 70–140)
Potassium: 3.6 mEq/L (ref 3.5–5.1)
Sodium: 145 mEq/L (ref 136–145)
TOTAL PROTEIN: 6.6 g/dL (ref 6.4–8.3)
Total Bilirubin: 0.47 mg/dL (ref 0.20–1.20)

## 2016-11-11 LAB — GLUCOSE, CAPILLARY
GLUCOSE-CAPILLARY: 133 mg/dL — AB (ref 65–99)
Glucose-Capillary: 178 mg/dL — ABNORMAL HIGH (ref 65–99)
Glucose-Capillary: 173 mg/dL — ABNORMAL HIGH (ref 65–99)

## 2016-11-11 LAB — MAGNESIUM: MAGNESIUM: 1.4 mg/dL — AB (ref 1.5–2.5)

## 2016-11-11 MED ORDER — DEXAMETHASONE SODIUM PHOSPHATE 4 MG/ML IJ SOLN
4.0000 mg | Freq: Two times a day (BID) | INTRAMUSCULAR | Status: DC
  Administered 2016-11-11 – 2016-11-12 (×3): 4 mg via INTRAVENOUS
  Filled 2016-11-11 (×3): qty 1

## 2016-11-11 MED ORDER — ACETAMINOPHEN 325 MG PO TABS: 650.0000 mg | ORAL_TABLET | ORAL | Status: DC | PRN

## 2016-11-11 MED ORDER — HEPARIN SODIUM (PORCINE) 5000 UNIT/ML IJ SOLN
5000.0000 [IU] | Freq: Two times a day (BID) | INTRAMUSCULAR | Status: DC
  Administered 2016-11-11 – 2016-11-12 (×2): 5000 [IU] via SUBCUTANEOUS
  Filled 2016-11-11 (×2): qty 1

## 2016-11-11 MED ORDER — SODIUM CHLORIDE 0.9 % IV SOLN
INTRAVENOUS | Status: DC
  Administered 2016-11-11: 13:00:00 via INTRAVENOUS
  Administered 2016-11-12: 1000 mL via INTRAVENOUS

## 2016-11-11 MED ORDER — HYDROMORPHONE HCL 1 MG/ML IJ SOLN
1.0000 mg | INTRAMUSCULAR | Status: DC | PRN
  Administered 2016-11-11 – 2016-11-12 (×4): 1 mg via INTRAVENOUS
  Filled 2016-11-11 (×6): qty 1

## 2016-11-11 MED ORDER — ENSURE ENLIVE PO LIQD: 237.0000 mL | Freq: Two times a day (BID) | ORAL | Status: DC

## 2016-11-11 MED ORDER — OXYCODONE-ACETAMINOPHEN 5-325 MG PO TABS
1.0000 | ORAL_TABLET | Freq: Once | ORAL | Status: AC
Start: 2016-11-11 — End: 2016-11-11
  Administered 2016-11-11: 1 via ORAL

## 2016-11-11 MED ORDER — ONDANSETRON HCL 4 MG/2ML IJ SOLN: 4.0000 mg | Freq: Three times a day (TID) | INTRAMUSCULAR | Status: DC | PRN

## 2016-11-11 MED ORDER — INSULIN ASPART 100 UNIT/ML ~~LOC~~ SOLN
0.0000 [IU] | Freq: Three times a day (TID) | SUBCUTANEOUS | Status: DC
  Administered 2016-11-11 (×2): 4 [IU] via SUBCUTANEOUS
  Administered 2016-11-12: 8 [IU] via SUBCUTANEOUS

## 2016-11-11 MED ORDER — GLYCOPYRROLATE 0.2 MG/ML IJ SOLN
0.1000 mg | Freq: Once | INTRAMUSCULAR | Status: AC
  Administered 2016-11-11: 0.1 mg via INTRAVENOUS
  Filled 2016-11-11: qty 0.5

## 2016-11-11 MED ORDER — LATANOPROST 0.005 % OP SOLN
1.0000 [drp] | Freq: Every day | OPHTHALMIC | Status: DC
  Filled 2016-11-11: qty 2.5

## 2016-11-11 MED ORDER — ONDANSETRON HCL 4 MG PO TABS: 4.0000 mg | ORAL_TABLET | Freq: Three times a day (TID) | ORAL | Status: DC | PRN

## 2016-11-11 MED ORDER — SENNOSIDES-DOCUSATE SODIUM 8.6-50 MG PO TABS: 1.0000 | ORAL_TABLET | Freq: Two times a day (BID) | ORAL | Status: DC

## 2016-11-11 MED ORDER — IPRATROPIUM-ALBUTEROL 0.5-2.5 (3) MG/3ML IN SOLN: 3.0000 mL | RESPIRATORY_TRACT | Status: DC | PRN

## 2016-11-11 MED ORDER — IPRATROPIUM-ALBUTEROL 0.5-2.5 (3) MG/3ML IN SOLN
3.0000 mL | Freq: Four times a day (QID) | RESPIRATORY_TRACT | Status: DC
  Administered 2016-11-11: 3 mL via RESPIRATORY_TRACT

## 2016-11-11 MED ORDER — OXYCODONE-ACETAMINOPHEN 5-325 MG PO TABS
ORAL_TABLET | ORAL | Status: AC
  Filled 2016-11-11: qty 1

## 2016-11-11 MED ORDER — SODIUM CHLORIDE 0.9 % IV SOLN
8.0000 mg | Freq: Three times a day (TID) | INTRAVENOUS | Status: DC | PRN
  Filled 2016-11-11: qty 4

## 2016-11-11 MED ORDER — IPRATROPIUM-ALBUTEROL 0.5-2.5 (3) MG/3ML IN SOLN: 3.0000 mL | Freq: Three times a day (TID) | RESPIRATORY_TRACT | Status: DC

## 2016-11-11 MED ORDER — ONDANSETRON 4 MG PO TBDP: 4.0000 mg | ORAL_TABLET | Freq: Three times a day (TID) | ORAL | Status: DC | PRN

## 2016-11-11 MED ORDER — FENTANYL 25 MCG/HR TD PT72
25.0000 ug | MEDICATED_PATCH | TRANSDERMAL | Status: DC
  Filled 2016-11-11: qty 1

## 2016-11-11 MED ORDER — LEVOTHYROXINE SODIUM 88 MCG PO TABS: 88.0000 ug | ORAL_TABLET | Freq: Every day | ORAL | Status: DC

## 2016-11-11 MED ORDER — IOPAMIDOL (ISOVUE-300) INJECTION 61%
INTRAVENOUS | Status: AC
  Administered 2016-11-11: 75 mL
  Filled 2016-11-11: qty 75

## 2016-11-11 MED ORDER — MAGNESIUM SULFATE 2 GM/50ML IV SOLN
2.0000 g | Freq: Once | INTRAVENOUS | Status: AC
  Administered 2016-11-11: 2 g via INTRAVENOUS
  Filled 2016-11-11: qty 50

## 2016-11-11 NOTE — H&P (Signed)
[place] NOTE  Patient Care Team: Charolette Forward, MD as PCP - General (Cardiology) Eppie Gibson, MD as Attending Physician (Radiation Oncology) Heath Lark, MD as Consulting Physician (Hematology and Oncology) Leota Sauers, RN as Oncology Nurse Navigator  CHIEF COMPLAINTS/PURPOSE OF ADMISSION Impeding airway obstruction with stridor, leukocytosis, difficulties clearing secretions, uncontrolled pain  HISTORY OF PRESENTING ILLNESS:  [name] 83 y.o. female is admitted for symptoms above This patient is well-known to me. She showed up today to be evaluated prior to her chemotherapy that is scheduled for 11/13/2016. The patient was recently discharged from the hospital and is currently in a skilled nursing facility Over the past week, she had progressive pain, difficulties breathing, poor oral intake and feeling weak. She has lost almost 7 pounds over 1 weeks and had unstable vitals signs in my office Her blood work came back with worsening leukocytosis. Due to signs of impeding airway obstriction, she is admitted for further management Summary of oncologic history as follows:   Cancer of anterior two-thirds of tongue ([place])   04/15/2016 Initial Diagnosis    The patient presents for evaluation of a left anterior tongue ulcer. She reports a several month history of gradually enlarging ulcerated lesion on the left anterior tongue, pain when chewing. She reports some intermittent bleeding and irritation. The patient reports weight loss over the last several months with difficulty chewing       04/22/2016 Imaging    CT neck: 1. 17 x 10 x 20 mm hyperdense mass lesion in the left anterior tongue associated with the area of ulceration. 2. No significant invasion of the deep tongue musculature or floor of mouth. 3. No significant metastatic disease. 4. Degenerative changes within the cervical spine, worse on the right. 5. Calcified right thyroid nodule.  Recommend further evaluation with thyroid ultrasound. If patient is clinically hyperthyroid, consider nuclear medicine thyroid uptake and scan.       05/14/2016 Surgery    Wide local excision, anterior 2/3 left tongue with reconstruction.      05/14/2016 Pathology Results    1. Tongue, biopsy, Anterior deep margin left - BENIGN SALIVARY GLAND-TYPE TISSUE AND SKELETAL MUSCLE. - THERE IS NO EVIDENCE OF MALIGNANCY. 2. Tongue, resection for tumor, Left lateral - INVASIVE SQUAMOUS CELL CARCINOMA, WELL DIFFERENTIATED, SPANNING 3.7 CM. - INVASIVE CARCINOMA IS BROADLY PRESENT AT THE DEEP MARGIN OF SPECIMEN #2. - PERINEURAL INVASION IS IDENTIFIED. - SEE ONCOLOGY TABLE BELOW.      06/12/2016 Surgery    Revision left hemiglossectomy      06/12/2016 Pathology Results    1. Tongue, resection for tumor, left lateral - BENIGN SQUAMOUS LINED MUCOSA WITH FOREIGN BODY GIANT CELL REACTION, CONSISTENT WITH PRIOR SURGICAL PROCEDURE. - THERE IS NO EVIDENCE OF MALIGNANCY. 2. Tongue, biopsy, deep margin, left - BENIGN SKELETAL MUSCLE. - THERE IS NO EVIDENCE OF MALIGNANCY.      09/22/2016 Imaging    Ct chest: 1. 4 mm left upper lobe partially calcified pulmonary nodule, suspect granuloma. No other pulmonary nodules are visualized. No significant mediastinal or hilar adenopathy. 2. Indeterminate 14 mm mass in the left adrenal gland. Further evaluation with adrenal CT or MRI may be obtained for further evaluation. 3. Thrombosis of the left internal jugular vein. Ill-defined soft tissue thickening and stranding anterior to the left jugular vein and around the inferior left sternocleidomastoid muscle. 4. Gallstones      09/22/2016 Imaging    CT neck: . 1. New bulky bilateral necrotic lymphadenopathy. Left  IJ chain node measures up to 53 mm and invades the IJ with tumor thrombus extending to the subclavian confluence. This node also invades the supraglottic larynx and left sternocleidomastoid. 2. Oral tongue  primary with possible residual/recurrent tumor along the right margin.       10/06/2016 PET scan    1. Bulky hypermetabolic bilateral neck adenopathy compatible with malignancy. These nodes are so large that they significantly narrow the supraglottic airway and could threaten the airway if further swelling or nodal enlargement occurs. I do not see a separate tongue mass (the original density along the left anterior tongue shown on 04/22/2016 is no longer seen) although the dominant right level Ib lymph node abuts the right tongue base. 2. 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 3. Hepatic morphology raises the possibility of cirrhosis, correlate with clinical history. Trace ascites. 4. 4 mm left upper lobe nodule is not changed from 09/22/2016, is not hypermetabolic but is below sensitive PET-CT size thresholds. 5. Other imaging findings of potential clinical significance:  Coronary, aortic arch, and branch vessel atherosclerotic vascular disease. Aortoiliac atherosclerotic vascular disease. Cholelithiasis. Old granulomatous disease. Sigmoid diverticulosis. Pelvic floor laxity. Perineal activity compatible with urinary incontinence. Considerable chronic sacroiliitis      10/19/2016 Imaging    CT neck 1. Massive necrotic cervical lymphadenopathy is increased in size. For example, the largest left-sided node measures up to 8.5 cm, previously 5.3 cm. 2. Findings of extranodal extension of lymphadenopathy and probable invasion of the left internal jugular vein now extending to junction with left brachiocephalic vein and craniocervical junction. 3. Mass effect on the airway which is displaced rightward and moderately narrow the level of hypopharynx. 4. Stable left upper lobe 4 mm nodule.       10/20/2016 - 10/31/2016 Hospital Admission    The patient was hospitalized  for management of impeding airway obstruction, hypercalcemia and leukocytosis.  She received 2 doses of chemotherapy while hospitalized with improvement of white count and hypercalcemia.  Her airway obstruction is also improve after chemotherapy      MEDICAL HISTORY:  Past Medical History:  Diagnosis Date  . Abdominal pain   . Blood dyscrasia    HYPOKALEMIA  . Blood transfusion   . COPD (chronic obstructive pulmonary disease) ([place])   . Dementia    pathologic aging and dementia per daughter  . Diabetes mellitus    Type II  . Heart murmur   . History of blood transfusion   . Hypertension   . Hypothyroid   . Neuropathy   . Substance abuse    ETOH with dementia and prior encephalopathy and withdrawal  . Tongue cancer ([place]) 05/13/2016  . Tricuspid regurgitation 2012   mild  . Urinary tract infection     SURGICAL HISTORY: Past Surgical History:  Procedure Laterality Date  . ABDOMINAL HYSTERECTOMY     1970's  . BREAST SURGERY Left    Lumpectomy  . COLONOSCOPY    . ESOPHAGOGASTRODUODENOSCOPY  05/17/2011   Procedure: ESOPHAGOGASTRODUODENOSCOPY (EGD);  Surgeon: Lafayette Dragon, MD;  Location: [facility] ENDOSCOPY;  Service: Endoscopy;  Laterality: N/A;  . EXCISION OF TONGUE LESION Left 05/13/2016   Procedure: WIDE LOCAL EXCISION OF LEFT LATERAL TONGUE CANCER;  Surgeon: Jerrell Belfast, MD;  Location: [facility];  Service: ENT;  Laterality: Left;  . EXCISION OF TONGUE LESION Left 06/12/2016   Procedure: RE-EXCISION OF LEFT  TONGUE CANCER;  Surgeon: Jerrell Belfast, MD;  Location: [facility];  Service: ENT;  Laterality: Left;  . EYE SURGERY Bilateral    Cataract  . IR FLUORO GUIDE CV LINE RIGHT  10/22/2016  . IR US GUIDE VASC ACCESS RIGHT  10/22/2016  . TONGUE SURGERY  05/13/2016   WIDE LOCAL EXCISION OF LEFT LATERAL TONGUE CANCER (Left) with Local Soft Tissue Reconstruction   . TONSILLECTOMY AND ADENOIDECTOMY      SOCIAL HISTORY: Social History   Social History  . Marital status: Divorced     Spouse name: N/A  . Number of children: 2  . Years of education: N/A   Occupational History  . retired Marine scientist    Social History Main Topics  . Smoking status: Current Every Day Smoker    Packs/day: 0.50    Years: 30.00    Types: Cigarettes  . Smokeless tobacco: Never Used  . Alcohol use Yes     Comment: social  . Drug use: No  . Sexual activity: No   Other Topics Concern  . Not on file   Social History Narrative  . No narrative on file    FAMILY HISTORY: Family History  Problem Relation Age of Onset  . Stroke Mother     ALLERGIES:  is allergic to tuberculin tests.  MEDICATIONS:  Current Facility-Administered Medications  Medication Dose Route Frequency Provider Last Rate Last Dose  . 0.9 %  sodium chloride infusion   Intravenous Continuous Alvy Bimler, Nkechi Linehan, MD      . acetaminophen (TYLENOL) tablet 650 mg  650 mg Oral Q4H PRN Shakyia Bosso, MD      . dexamethasone (DECADRON) injection 4 mg  4 mg Intravenous Q12H Dannell Gortney, MD      . fentaNYL (DURAGESIC - dosed mcg/hr) patch 25 mcg  25 mcg Transdermal Q72H Jagdeep Ancheta, MD      . heparin injection 5,000 Units  5,000 Units Subcutaneous BID Alvy Bimler, Desirea Mizrahi, MD      . HYDROmorphone (DILAUDID) injection 1 mg  1 mg Intravenous Q2H PRN Mariajose Mow, MD      . insulin aspart (novoLOG) injection 0-24 Units  0-24 Units Subcutaneous TID WC Ludene Stokke, MD      . ipratropium-albuterol (DUONEB) 0.5-2.5 (3) MG/3ML nebulizer solution 3 mL  3 mL Nebulization Q6H Dalene Robards, MD      . latanoprost (XALATAN) 0.005 % ophthalmic solution 1 drop  1 drop Both Eyes QHS Alvy Bimler, Jiya Kissinger, MD      . Derrill Memo ON 11/12/2016] levothyroxine (SYNTHROID, LEVOTHROID) tablet 88 mcg  88 mcg Oral QAC breakfast Miraya Cudney, MD      . magnesium sulfate IVPB 2 g 50 mL  2 g Intravenous Once Alvy Bimler, Adyline Huberty, MD      . ondansetron (ZOFRAN) tablet 4-8 mg  4-8 mg Oral Q8H PRN Alvy Bimler, Avrohom Mckelvin, MD       Or  . ondansetron (ZOFRAN-ODT) disintegrating tablet 4-8 mg  4-8 mg Oral Q8H PRN Alvy Bimler,  Vannesa Abair, MD       Or  . ondansetron (ZOFRAN) injection 4 mg  4 mg Intravenous Q8H PRN Biana Haggar, MD       Or  . ondansetron (ZOFRAN) 8 mg in sodium chloride 0.9 % 50 mL IVPB  8 mg Intravenous Q8H PRN Gavriel Holzhauer, MD      . senna-docusate (Senokot-S) tablet 1 tablet  1 tablet Oral BID Alvy Bimler, Fletcher Rathbun, MD        REVIEW OF SYSTEMS:   Constitutional: Denies fevers, chills or abnormal night  sweats Eyes: Denies blurriness of vision, double vision or watery eyes Cardiovascular: Denies palpitation, chest discomfort or lower extremity swelling Gastrointestinal:  Denies nausea, heartburn or change in bowel habits Skin: Denies abnormal skin rashes Neurological:Denies numbness, tingling or new weaknesses Behavioral/Psych: Mood is stable, no new changes  All other systems were reviewed with the patient and are negative.  PHYSICAL EXAMINATION: ECOG PERFORMANCE STATUS: 2 - Symptomatic, <50% confined to bed BP: 86/59 HR: 112 RR: 25 O2 Saturations:100  GENERAL:alert, no distress and appears to struggle with oral secreations SKIN: noted discolorations on her skin EYES: normal, conjunctiva are pink and non-injected, sclera clear OROPHARYNX: Noted significant oral secretions NECK: Neck is obscured by bilateral neck lymphadenopathy  LYMPH: She has large bilateral lymphadenopathy in the neck.  The one on the right is still also ulcerated through the skin and draining  LUNGS: Noted rhonchi throughout. HEART: Tachycardia, no murmurs and no lower extremity edema ABDOMEN:abdomen soft, non-tender and normal bowel sounds Musculoskeletal:no cyanosis of digits and no clubbing  PSYCH: alert & oriented x 3 with significant dysarthria due to difficulties clearing secretions NEURO: no focal motor/sensory deficits  LABORATORY DATA:  I have reviewed the data as listed Lab Results  Component Value Date   WBC 36.3 (H) 11/11/2016   HGB 10.9 (L) 11/11/2016   HCT 33.2 (L) 11/11/2016   MCV 82.4 11/11/2016   PLT 263  11/11/2016    Recent Labs  10/28/16 0509 10/29/16 0537 10/30/16 0500 11/05/16 1032 11/11/16 0947  NA 136 138 137 138 145  K 3.1* 4.0 3.8 3.2* 3.6  CL 91* 95* 100*  --   --   CO2 34* 31 29 26 27   GLUCOSE 134* 182* 137* 286* 230*  BUN 13 22* 19 24.7 28.0*  CREATININE 0.60 0.80 0.64 0.8 1.1  CALCIUM 8.5* 9.1 8.4* 9.3 9.2  GFRNONAA >60 >60 >60  --   --   GFRAA >60 >60 >60  --   --   PROT 7.1 7.2 6.5 6.4 6.6  ALBUMIN 3.2* 3.4* 3.3* 2.9* 3.0*  AST 20 22 15 7 6   ALT 13* 14 11* 8 7  ALKPHOS 84 88 78 98 127  BILITOT 0.5 0.5 0.4 0.34 0.47    RADIOGRAPHIC STUDIES: I have personally reviewed the radiological images as listed and agreed with the findings in the report. X-ray Chest Pa And Lateral  Result Date: 10/20/2016 CLINICAL DATA:  83 year old female with dry cough. EXAM: CHEST  2 VIEW COMPARISON:  January 31, 2013 FINDINGS: The heart size is mildly enlarged. The hila and mediastinum are normal. No pulmonary nodules, masses, or focal infiltrates. No acute abnormalities. IMPRESSION: No active cardiopulmonary disease. Electronically Signed   By: Dorise Bullion III M.D   On: 10/20/2016 20:02   Ct Soft Tissue Neck W Contrast  Result Date: 10/19/2016 CLINICAL DATA:  83 y/o  F; EXAM: CT NECK WITH CONTRAST TECHNIQUE: Multidetector CT imaging of the neck was performed using the standard protocol following the bolus administration of intravenous contrast. CONTRAST:  43mL ISOVUE-300 IOPAMIDOL (ISOVUE-300) INJECTION 61% COMPARISON:  10/06/2016 PET-CT.  09/22/2016 CT of the neck. FINDINGS: Pharynx and larynx: Mass effect upon the airway which is displaced rightward and moderately narrowed at the level of the hypopharynx measuring 2.1 x 0.4 cm. Salivary glands: No inflammation, mass, or stone. Thyroid: Multiple thyroid nodules including a densely calcified nodule in right lobe of thyroid are stable. Lymph nodes: Massive necrotic cervical lymphadenopathy of right 2,3,5 and left 2,3,4,5 lymph node  stations markedly  increased in size when compared with prior studies, for example a left-sided 2/3 level node measures 8.5 x 7.9 x 7.5 cm (AP x ML x CC series 2: Image 54 and 6:69), previously up to 5.3 cm. The necrotic lymph nodes have ill-defined margins with surrounding fat stranding indicating extranodal extension. The large right upper cervical lymph node at angle of mandible invades the right masticator space and has an ill-defined margin with the right submandibular gland. Go the largest left upper cervical lymph node extends medially into the left carotid space, parapharyngeal space on, and has an ill-defined margin with both the left submandibular gland and parotid gland. Vascular: Left internal jugular vein thrombus, suspected tumor thrombus, now extends to the junction with the left brachiocephalic vein inferiorly and the craniocervical junction superiorly, progressed from prior studies. Limited intracranial: Negative. Visualized orbits: Negative. Mastoids and visualized paranasal sinuses: Clear. Skeleton: Stable cervical spondylosis. No high-grade bony canal stenosis. No destructive osseous lesion is identified. Upper chest: Stable left upper lobe 4 mm nodule (series 2, image 88). Other: None. IMPRESSION: 1. Massive necrotic cervical lymphadenopathy is increased in size. For example, the largest left-sided node measures up to 8.5 cm, previously 5.3 cm. 2. Findings of extranodal extension of lymphadenopathy and probable invasion of the left internal jugular vein now extending to junction with left brachiocephalic vein and craniocervical junction. 3. Mass effect on the airway which is displaced rightward and moderately narrow the level of hypopharynx. 4. Stable left upper lobe 4 mm nodule. Electronically Signed   By: Kristine Garbe M.D.   On: 10/19/2016 17:54   Ir Fluoro Guide Cv Line Right  Result Date: 10/22/2016 INDICATION: Patient with history of tongue cancer; central venous access  requested for chemotherapy. EXAM: RIGHT UPPER EXTREMITY PICC LINE PLACEMENT WITH ULTRASOUND AND FLUOROSCOPIC GUIDANCE MEDICATIONS: None ANESTHESIA/SEDATION: None FLUOROSCOPY TIME:  Fluoroscopy Time:  12 seconds COMPLICATIONS: None immediate. PROCEDURE: The patient/family was advised of the possible risks and complications and agreed to undergo the procedure. The patient was then brought to the angiographic suite for the procedure. The right arm was prepped with chlorhexidine, draped in the usual sterile fashion using maximum barrier technique (cap and mask, sterile gown, sterile gloves, large sterile sheet, hand hygiene and cutaneous antisepsis) and infiltrated locally with 1% Lidocaine. Ultrasound demonstrated patency of the right basilic vein, and this was documented with an image. Under real-time ultrasound guidance, this vein was accessed with a 21 gauge micropuncture needle and image documentation was performed. A 0.018 wire was introduced in to the vein. Over this, a 5 [place] double lumen power injectable PICC was advanced to the lower SVC/right atrial junction. Fluoroscopy during the procedure and fluoro spot radiograph confirms appropriate catheter position. The catheter was flushed and covered with a sterile dressing. Catheter length: 36 cm IMPRESSION: Successful right arm power PICC line placement with ultrasound and fluoroscopic guidance. The catheter is ready for use. Read by: Rowe Robert, PA-C Electronically Signed   By: Sandi Mariscal M.D.   On: 10/22/2016 11:49   Ir US Guide Vasc Access Right  Result Date: 10/22/2016 INDICATION: Patient with history of tongue cancer; central venous access requested for chemotherapy. EXAM: RIGHT UPPER EXTREMITY PICC LINE PLACEMENT WITH ULTRASOUND AND FLUOROSCOPIC GUIDANCE MEDICATIONS: None ANESTHESIA/SEDATION: None FLUOROSCOPY TIME:  Fluoroscopy Time:  12 seconds COMPLICATIONS: None immediate. PROCEDURE: The patient/family was advised of the possible risks and  complications and agreed to undergo the procedure. The patient was then brought to the angiographic suite for the procedure. The right  arm was prepped with chlorhexidine, draped in the usual sterile fashion using maximum barrier technique (cap and mask, sterile gown, sterile gloves, large sterile sheet, hand hygiene and cutaneous antisepsis) and infiltrated locally with 1% Lidocaine. Ultrasound demonstrated patency of the right basilic vein, and this was documented with an image. Under real-time ultrasound guidance, this vein was accessed with a 21 gauge micropuncture needle and image documentation was performed. A 0.018 wire was introduced in to the vein. Over this, a 5 [place] double lumen power injectable PICC was advanced to the lower SVC/right atrial junction. Fluoroscopy during the procedure and fluoro spot radiograph confirms appropriate catheter position. The catheter was flushed and covered with a sterile dressing. Catheter length: 36 cm IMPRESSION: Successful right arm power PICC line placement with ultrasound and fluoroscopic guidance. The catheter is ready for use. Read by: Rowe Robert, PA-C Electronically Signed   By: Sandi Mariscal M.D.   On: 10/22/2016 11:49   Dg Chest Port 1 View  Result Date: 10/23/2016 CLINICAL DATA:  Cough and shortness of breath EXAM: PORTABLE CHEST 1 VIEW COMPARISON:  Oct 20, 2016 FINDINGS: There is extensive airspace consolidation throughout much of the right lung. There is a degree of superimposed pleural effusion on the right. Left lung is clear. Heart is upper normal in size with pulmonary vascularity normal. There is aortic atherosclerosis. No evident adenopathy. Central catheter tip is at cavoatrial junction. No pneumothorax. Calcification is noted in the right carotid artery with what appears to be dilatation in the right carotid artery measuring 1.3 cm. IMPRESSION: Extensive airspace consolidation throughout much the right lung with superimposed right pleural effusion.  Suspect widespread right lung pneumonia. Left lung clear. Stable cardiac silhouette. Aortic atherosclerosis. Calcification in the right carotid artery with apparent aneurysmal dilatation, calcified, in the right carotid artery. Electronically Signed   By: Lowella Grip III M.D.   On: 10/23/2016 07:20   Dg Swallowing Func-speech Pathology  Result Date: 10/21/2016 Objective Swallowing Evaluation: Type of Study: MBS-Modified Barium Swallow Study Patient Details Name: EVOLEHT HOVATTER MRN: [id number] Date of Birth: Aug 25, 1933 Today's Date: 10/21/2016 Time: SLP Start Time (ACUTE ONLY): 1457-SLP Stop Time (ACUTE ONLY): 9924 SLP Time Calculation (min) (ACUTE ONLY): 20 min Past Medical History: Past Medical History: Diagnosis Date . Abdominal pain  . Blood dyscrasia   HYPOKALEMIA . Blood transfusion  . COPD (chronic obstructive pulmonary disease) ([place])  . Dementia   pathologic aging and dementia per daughter . Diabetes mellitus   Type II . Heart murmur  . History of blood transfusion  . Hypertension  . Hypothyroid  . Neuropathy  . Substance abuse   ETOH with dementia and prior encephalopathy and withdrawal . Tongue cancer ([place]) 05/13/2016 . Tricuspid regurgitation 2012  mild . Urinary tract infection  Past Surgical History: Past Surgical History: Procedure Laterality Date . ABDOMINAL HYSTERECTOMY    1970's . BREAST SURGERY Left   Lumpectomy . COLONOSCOPY   . ESOPHAGOGASTRODUODENOSCOPY  05/17/2011  Procedure: ESOPHAGOGASTRODUODENOSCOPY (EGD);  Surgeon: Lafayette Dragon, MD;  Location[facility] ENDOSCOPY;  Service: Endoscopy;  Laterality: N/A; . EXCISION OF TONGUE LESION Left 05/13/2016  Procedure: WIDE LOCAL EXCISION OF LEFT LATERAL TONGUE CANCER;  Surgeon: Jerrell Belfast, MD;  Location: [facility];  Service: ENT;  Laterality: Left; . EXCISION OF TONGUE LESION Left 06/12/2016  Procedure: RE-EXCISION OF LEFT  TONGUE CANCER;  Surgeon: Jerrell Belfast, MD;  Location: [facility];  Service: ENT;  Laterality: Left; . EYE SURGERY Bilateral    Cataract . TONGUE SURGERY  05/13/2016  WIDE LOCAL EXCISION OF LEFT LATERAL TONGUE CANCER (Left) with Local Soft Tissue Reconstruction  . TONSILLECTOMY AND ADENOIDECTOMY   HPI: Pt is an 83 yo female adm to [facility] with h/o cancer of anterior 2/3 tongue s/p surgery 05/2016 and now has rapidly growing tumor in her left neck and jaw region for which she is scheduled to have radiation.   Massive neck/cervical lymphadenopathy 8.5 cm with invasion of IJ with tumor thrombus per MD note.   Pt with left supraglottic larynx/pharynx, right submandibular - node 4 cm.  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  Swallow evaluation ordered.   Pt denies ever having xray swallow study done before.  Subjective: pt awake in chair Assessment / Plan / Recommendation CHL IP CLINICAL IMPRESSIONS 10/21/2016 Clinical Impression Pt tested in lateral and A-P view.  Pt presents with mild oropharyngeal dysphagia without aspiration of any consistency tested (thin, nectar, pudding, cracker and barium tablet). Decreased oral manipulation due to pt's lingual surgery noted.  Pharyngeal swallow was overall strong without significant residuals/aspiration.  Very trace laryngeal penetration of thin liquid secondary to decreased timing of laryngeal closure cleared indpendently.  Of note, pt voice was clear prior to and during MBS - she reports wet voice occuring AFTER meals.  Pt is likely having aspiration of secretions but was protective of her airway with MBS.   Using live imaging, educated pt to findings/recommendations.   When Md indicates, recommend advance to dys3/thin due to oral deficits with strict precautions. SlP to follow briefly for pt education/po toelrance.  Thanks for this consult.  SLP Visit Diagnosis Dysphagia, oropharyngeal phase (R13.12) Attention and concentration deficit following -- Frontal lobe and executive function deficit following -- Impact on safety and  function Mild aspiration risk   CHL IP TREATMENT RECOMMENDATION 10/21/2016 Treatment Recommendations Therapy as outlined in treatment plan below   Prognosis 10/21/2016 Prognosis for Safe Diet Advancement Fair Barriers to Reach Goals Severity of deficits;Time post onset Barriers/Prognosis Comment -- CHL IP DIET RECOMMENDATION 10/21/2016 SLP Diet Recommendations -- Liquid Administration via Straw Medication Administration Whole meds with liquid Compensations Slow rate;Small sips/bites;Follow solids with liquid Postural Changes Remain semi-upright after after feeds/meals (Comment);Seated upright at 90 degrees   CHL IP OTHER RECOMMENDATIONS 10/21/2016 Recommended Consults -- Oral Care Recommendations Oral care BID Other Recommendations --   CHL IP FOLLOW UP RECOMMENDATIONS 10/21/2016 Follow up Recommendations (No Data)   CHL IP FREQUENCY AND DURATION 10/21/2016 Speech Therapy Frequency (ACUTE ONLY) min 1 x/week Treatment Duration 1 week      CHL IP ORAL PHASE 10/21/2016 Oral Phase Impaired Oral - Pudding Teaspoon -- Oral - Pudding Cup -- Oral - Honey Teaspoon -- Oral - Honey Cup -- Oral - Nectar Teaspoon -- Oral - Nectar Cup Weak lingual manipulation Oral - Nectar Straw Weak lingual manipulation Oral - Thin Teaspoon Weak lingual manipulation Oral - Thin Cup Weak lingual manipulation Oral - Thin Straw Weak lingual manipulation Oral - Puree Weak lingual manipulation;Reduced posterior propulsion Oral - Mech Soft Weak lingual manipulation;Impaired mastication;Reduced posterior propulsion Oral - Regular -- Oral - Multi-Consistency -- Oral - Pill Weak lingual manipulation;Reduced posterior propulsion Oral Phase - Comment --  CHL IP PHARYNGEAL PHASE 10/21/2016 Pharyngeal Phase Impaired Pharyngeal- Pudding Teaspoon -- Pharyngeal -- Pharyngeal- Pudding Cup -- Pharyngeal -- Pharyngeal- Honey Teaspoon -- Pharyngeal -- Pharyngeal- Honey Cup -- Pharyngeal -- Pharyngeal- Nectar Teaspoon -- Pharyngeal -- Pharyngeal- Nectar Cup WFL Pharyngeal --  Pharyngeal- Nectar Straw WFL Pharyngeal -- Pharyngeal- Thin Teaspoon WFL Pharyngeal -- Pharyngeal- Thin Cup WFL Pharyngeal -- Pharyngeal- Thin Straw WFL Pharyngeal -- Pharyngeal- Puree WFL Pharyngeal -- Pharyngeal- Mechanical Soft WFL Pharyngeal -- Pharyngeal- Regular -- Pharyngeal -- Pharyngeal- Multi-consistency -- Pharyngeal -- Pharyngeal- Pill WFL Pharyngeal -- Pharyngeal Comment --  CHL IP CERVICAL ESOPHAGEAL PHASE 10/21/2016 Cervical Esophageal Phase Impaired Pudding Teaspoon -- Pudding Cup -- Honey Teaspoon -- Honey Cup -- Nectar Teaspoon -- Nectar Cup -- Nectar Straw -- Thin Teaspoon -- Thin Cup -- Thin Straw -- Puree -- Mechanical Soft -- Regular -- Multi-consistency -- Pill -- Cervical Esophageal Comment barium tablet appeared to lodge at mid-esophagus without awareness, further bolus of pudding did not faciliate clearance however multiple boluses of liquids effective;  radiologist is not present and MBS is not designed to examine below UES - if MD deems indicated, pt may benefit from dedicated esophageal evaluation No flowsheet data found. Luanna Salk, [place] [facility] SLP [phone]               ASSESSMENT & PLAN:   Tongue cancer [facility]), locally advanced disease Imminent airway compromise with mild stridor and excessive airway secretion So far, she had received 3 doses of weekly cisplatin with good control of malignant hypercalcemia I am concerned about airway stridor today I will start her on some IV steroids and Robinul to dry out some secretions I will also add nebulizer treatment I recommend repeat CT scan of the neck to assess her airway and state of disease  Tachycardia, hypotensive and mild renal failure This is likely due to dehydration secondary to difficulty swallowing due to pain I will start her on some IV fluids  Cancer pain The patient is unreliable and will not ask for pain medicine Continue fentanyl patch and add IV pain medicine as needed  Leukocytosis with necrotic tumor  with serosanguineous discharge/cellulitis Likely multifactorial due to pneumonia and steroids Continue dry dressing changes She had completed prolonged course of IV antibiotics recently Unless she has fever, I will hold off IV antibiotics for now I have ordered blood culture and urine culture.  Chest x-ray so far show no evidence of pneumonia  Poor oral intake with mild protein calorie malnutrition We will consult dietitian  Steroid induced hyperglycemia We will titrate insulin Hold oral hypoglycemics  Hypomagnesemia Due to recent cisplatin We will replace intravenously  Dysphagia and dysarthria Due to cancer pain and her cancer  Dementia due to another medical condition The patient is not in the state of mind where she can make medical decision Her daughter, Seth Bake is a dedicated medical healthcare power of attorney  CODE STATUS DNR  Goals of care, counseling/discussion The patient is aware shehas incurable disease and treatment is strictly palliative. We discussed importance of Advanced Directives and Living will. Her daughter appears to be the healthcare medical power of attorney I discussed goals of care with the patient's daughter and she agree with the plan of care as above  Discharge planning Unknown, pending further assessment     Heath Lark, MD 11/11/2016 11:32 AM

## 2016-11-11 NOTE — Progress Notes (Signed)
This encounter was created in error - please disregard.

## 2016-11-11 NOTE — Progress Notes (Signed)
Pt with severe work of breathing. Pt lethargic but arousable. Audible airyway constriction. RT and MD paged. Pt placed on 2L [place], saturating at 100%. No other intervention available by RT. Dr. Burr Medico to contact Dr. Alvy Bimler in regards to pt and family wishes and to discuss goals of care. Pt not expressing distress and seems to be resting comfortably. Pt states "I'm just tired."  Will continue to monitor.

## 2016-11-12 DIAGNOSIS — R061 Stridor: Secondary | ICD-10-CM

## 2016-11-12 DIAGNOSIS — J988 Other specified respiratory disorders: Secondary | ICD-10-CM

## 2016-11-12 DIAGNOSIS — C779 Secondary and unspecified malignant neoplasm of lymph node, unspecified: Secondary | ICD-10-CM

## 2016-11-12 DIAGNOSIS — J690 Pneumonitis due to inhalation of food and vomit: Secondary | ICD-10-CM

## 2016-11-12 DIAGNOSIS — D72825 Bandemia: Secondary | ICD-10-CM

## 2016-11-12 DIAGNOSIS — E43 Unspecified severe protein-calorie malnutrition: Secondary | ICD-10-CM

## 2016-11-12 DIAGNOSIS — E876 Hypokalemia: Secondary | ICD-10-CM

## 2016-11-12 DIAGNOSIS — Z515 Encounter for palliative care: Secondary | ICD-10-CM

## 2016-11-12 LAB — GLUCOSE, CAPILLARY
GLUCOSE-CAPILLARY: 210 mg/dL — AB (ref 65–99)
Glucose-Capillary: 77 mg/dL (ref 65–99)

## 2016-11-12 MED ORDER — FENTANYL 25 MCG/HR TD PT72: 25.0000 ug | MEDICATED_PATCH | TRANSDERMAL | Status: AC

## 2016-11-12 NOTE — Progress Notes (Signed)
The patient is seen today.  She appears comfortable with conservative management with stable vitals sign. Please see discharge summary for details related to her exam. I have spent over 1 hour on the phone alone talking to [place], more than 7 times, including arrangement for her  imaging studies to be transferred to [place] I have spoken with Dr. Maylene Roes, ENT specialist from [place] who felt that there might be surgical intervention possible to get tracheostomy for airway protection. I spoken with a hospitalist from [place] who is not comfortable accepting the patient. Subsequently, I spoke with Dr. Mahlon Gammon from Medical ICU from [facility] who has tentatively accepted patient's transfer pending bed availability. I updated the patient's daughter, Seth Bake at around 1:30 PM.  Seth Bake expressed concern about the patient not being managed on an ICU floor She requested consultation with critical care specialist to consider moving her to medical ICU I have placed a stat consult and spoken with Dr. Nelda Marseille who will be seeing the patient as consultation and determine whether it is appropriate for transfer to medical ICU Her discharge summary is signed and ready Hopefully, she can be moved to [facility] when bed is available

## 2016-11-12 NOTE — Progress Notes (Signed)
Report given to ICU nurse Christy. All questions answered appropriately.

## 2016-11-12 NOTE — Progress Notes (Signed)
[facility] TRANSFERS of care PROGRESS NOTE    Progress Note  [name]  HKV:[id number] DOB: 09/02/1933 DOA: 11/11/2016 PCP: Charolette Forward, MD     Brief Narrative:   [name] is an 83 y.o. female past medical history of advance tongue cancer with large local intervention, also past medical history of malignant hypercalcemia that went to the oncologist office and was found to have stridor she was admitted to the hospital for concern of airway compromise. The patient has been evaluated by critical care wasn't impinged she could be intubated. She does not meet criteria for ICU, so [facility] has been consulted to transfer the patient stepdown unit. She will be transferred to stepdown for close monitoring  Assessment/Plan:   Active Problems:   DM (diabetes mellitus) ([place])   Leukocytosis   Severe malnutrition (HCC)   Cancer of anterior two-thirds of tongue ([place])   Dementia due to another medical condition   Metastasis to lymph nodes (HCC)   Aspiration pneumonia [facility])   Airway compromise   Stridor   Palliative care encounter  There is an 83 year old female with a dance large and impressive tongue cancer with with a very poor prognosis. She has been evaluated by several ENT doctors [place] her did not candidate for intubation. Critical care has evaluated the patient and relates she does not meet ICU criteria. The patient is very unhappy with the care at [address] and has requested transfer, due to her stridor and patient eventually transferred to the stepdown unit. The patient does not wish to meet with hospice at this time. DO NOT RESUSCITATE was confirmed and family does not wish emergent intubation. The family would like to be transferred to [facility] for second opinion and probably intervention there. At this time she is satting on 2% on 2 L of oxygen shows a white count of 36,000 hemoglobin of 10, she was afebrile, she is not on antibiotics. He is on IV dexamethasone, CT scan of  the head and neck was done that showed progression of disease. Her leukocytosis is probably due to necrosis of her tumor, she spikes a fever will continue to hold off on antibiotics. Blood cultures remain negative till date.  Sliding-scale insulin for diabetes continue IV narcotics. I will continue normal saline at 50 mL. The oncologist has requested and the patient has been accepted to [place] she is pending bed for transfer.  DVT prophylaxis: Heparin Family Communication:daughter Disposition Plan/Barrier to D/C: transfer to SDU Code Status:     Code Status Orders        Start     Ordered   11/11/16 1124  Do not attempt resuscitation (DNR)  Continuous    Question Answer Comment  In the event of cardiac or respiratory ARREST Do not call a "code blue"   In the event of cardiac or respiratory ARREST Do not perform Intubation, CPR, defibrillation or ACLS   In the event of cardiac or respiratory ARREST Use medication by any route, position, wound care, and other measures to relive pain and suffering. May use oxygen, suction and manual treatment of airway obstruction as needed for comfort.      11/11/16 1125    Code Status History    Date Active Date Inactive Code Status Order ID Comments User Context   10/20/2016  4:16 PM 11/01/2016 12:48 AM DNR [id number]  Heath Lark, MD Inpatient   06/12/2016 12:29 PM 06/14/2016  2:47 PM Full Code [id number]  Jerrell Belfast, MD Inpatient  05/13/2016 12:48 PM 05/15/2016  3:45 PM Full Code [id number]  Jerrell Belfast, MD Inpatient   01/31/2013  9:37 PM 02/06/2013  6:01 PM Full Code [id number]  Merton Border, MD Inpatient   07/18/2012  8:47 PM 07/21/2012  7:51 PM Full Code [id number]  Elfredia Nevins, RN Inpatient   02/11/2012 11:44 PM 02/13/2012  7:35 PM Full Code [id number]  Gildardo Pounds, RN Inpatient   01/04/2012  5:24 AM 01/07/2012 11:02 PM Full Code [id number]  Roxan Diesel, RN Inpatient        IV Access:    Peripheral IV   Procedures and  diagnostic studies:   Dg Chest 2 View  Result Date: 11/11/2016 CLINICAL DATA:  Low blood pressure.  Cough.  COPD. EXAM: CHEST  2 VIEW COMPARISON:  10/23/2016. FINDINGS: Right PICC line noted with tip projected over superior vena cava. Heart size normal. Interval clearing of right lung infiltrate. No pleural effusion or pneumothorax. IMPRESSION: 1. Right PICC line in good anatomic position. 2. Interval clearing of right pulmonary infiltrate. Electronically Signed   By: Marcello Moores  Register   On: 11/11/2016 11:49   Ct Soft Tissue Neck W Contrast  Addendum Date: 11/11/2016   ADDENDUM REPORT: 11/11/2016 17:04 ADDENDUM: Critical Value/emergent results were called by telephone at the time of interpretation on 11/11/2016 at 1658 hours to Dr. Burr Medico, who verbally acknowledged these results. Electronically Signed   By: Genevie Ann M.D.   On: 11/11/2016 17:04   Result Date: 11/11/2016 CLINICAL DATA:  83 year old female with invasive squamous cell carcinoma. Massive necrotic tumor in the neck. Impending airway obstruction with stridor. Chemotherapy ongoing. EXAM: CT NECK WITH CONTRAST TECHNIQUE: Multidetector CT imaging of the neck was performed using the standard protocol following the bolus administration of intravenous contrast. CONTRAST:  59mL ISOVUE-300 IOPAMIDOL (ISOVUE-300) INJECTION 61% COMPARISON:  Neck CT 10/19/2016 and earlier. FINDINGS: Pharynx and larynx: Negative nasopharynx. Progressive compression of the oropharynx and hypopharynx since 10/19/2016. Critical appearing airway stenosis now at the level of the hypopharynx as seen on series 2, image 59. Continued mass effect on the larynx, especially the left side, but the laryngeal airway patency is much better preserved, and not significantly changed since April. The subglottic trachea appears normal aside from small volume retained secretions. Salivary glands: Sublingual and submandibular spaces distorted by progressive massive heterogeneously enhancing soft  tissue tumor. Infiltration of the inferior parotid spaces worse on the left. Thyroid: Stable and negative; dystrophic calcifications in the right lobe. Lymph nodes: The massive 8.5 cm rounded necrotic tumor occupying the anterior left lower neck has progressed to up to 10.3 cm diameter now (series 2, image 76 today versus series 2, image 59 in April). The more thickened and variegated tumor engulfing the angle of the mandible and extending caudally has enlarged up to 6.5 cm now (previously 4.9 cm). Associated enlargement of other bilateral smaller yet extensive necrotic nodal and ex nodal disease, including in the left retropharyngeal space at the level of the oropharynx (series 2, image 42). Vascular: Tumor thrombus within the left internal jugular vein extends farther into the left innominate vein now (series 2, image 110) but the left subclavian and innominate veins remain patent. Right IJ approach PICC line is visible. Other major vascular structures in the neck and at the skullbase remain patent. There is superimposed atherosclerosis. Limited intracranial: Visible brain parenchyma remains normal. Visualized orbits: Negative. Mastoids and visualized paranasal sinuses: Clear. Skeleton: No bone destruction or suspicious osseous lesion identified in the neck. Upper chest: Visible lung  parenchyma is stable and clear; a small left upper lobe lung nodule on series 3, image 30 is unchanged since November 2017 and appears benign. No superior mediastinal lymphadenopathy. Calcified aortic atherosclerosis. Left innominate vein thrombus described above. IMPRESSION: 1. Progression of bulky necrotic tumor and nodal metastases with extracapsular extension throughout the bilateral neck. 2. Progressed and critical appearing airway stenosis at the level of the hypopharynx (series 2, image 59). By comparison laryngeal and subglottic tracheal patency is relatively normal. 3. Progressed tumor thrombus extending from the left IJ into  the left innominate vein, but the left subclavian and innominate veins remain patent. Electronically Signed: By: Genevie Ann M.D. On: 11/11/2016 16:56     Medical Consultants:    None.  Anti-Infectives:     Subjective:    Lizbeth Bark she relates some neck discomfort which is controlled with narcotics.  Objective:    Vitals:   11/11/16 1447 11/11/16 2052 11/12/16 0439 11/12/16 1424  BP: (!) 111/42 98/82 99/74  (!) 151/86  Pulse: 87 82 78 91  Resp: 18 16 18 12   Temp: 98.4 F (36.9 C) 98.8 F (37.1 C) 98 F (36.7 C) 98.7 F (37.1 C)  TempSrc: Oral Oral Oral Oral  SpO2: 98% 94% 92% 100%  Weight:      Height:        Intake/Output Summary (Last 24 hours) at 11/12/16 1537 Last data filed at 11/12/16 1518  Gross per 24 hour  Intake          1432.67 ml  Output             1000 ml  Net           432.67 ml   Filed Weights   11/11/16 1151  Weight: 41.1 kg (90 lb 9.7 oz)    Exam: General exam: In no acute distress.Her neck is deformed by 2 large masses one on the left extremely large mildly tender to palpation and the second one on the right. Respiratory system: Air movement with stridor but clear to auscultation. Cardiovascular system: Normal rate and rhythm Gastrointestinal system: Abdomen is soft nontender nondistended Central nervous system: Awake alert and oriented 3 Extremities: Pedal edema. Skin: The skin around the largest mass is purpuric and red little bit around to touch and tender Psychiatry: Judgement and insight appear normal. Mood & affect appropriate.    Data Reviewed:    Labs: Basic Metabolic Panel:  Recent Labs Lab 11/11/16 0947  NA 145  K 3.6  CO2 27  GLUCOSE 230*  BUN 28.0*  CREATININE 1.1  CALCIUM 9.2  MG 1.4*   GFR Estimated Creatinine Clearance: 26.5 mL/min (by C-G formula based on SCr of 1.1 mg/dL). Liver Function Tests:  Recent Labs Lab 11/11/16 0947  AST 6  ALT 7  ALKPHOS 127  BILITOT 0.47  PROT 6.6  ALBUMIN  3.0*   No results for input(s): LIPASE, AMYLASE in the last 168 hours. No results for input(s): AMMONIA in the last 168 hours. Coagulation profile No results for input(s): INR, PROTIME in the last 168 hours.  CBC:  Recent Labs Lab 11/11/16 0947  WBC 36.3*  NEUTROABS 31.8*  HGB 10.9*  HCT 33.2*  MCV 82.4  PLT 263   Cardiac Enzymes: No results for input(s): CKTOTAL, CKMB, CKMBINDEX, TROPONINI in the last 168 hours. BNP (last 3 results) No results for input(s): PROBNP in the last 8760 hours. CBG:  Recent Labs Lab 11/11/16 1215 11/11/16 1657 11/11/16 2042 11/12/16 1200  GLUCAP 178*  173* 133* 210*   D-Dimer: No results for input(s): DDIMER in the last 72 hours. Hgb A1c: No results for input(s): HGBA1C in the last 72 hours. Lipid Profile: No results for input(s): CHOL, HDL, LDLCALC, TRIG, CHOLHDL, LDLDIRECT in the last 72 hours. Thyroid function studies: No results for input(s): TSH, T4TOTAL, T3FREE, THYROIDAB in the last 72 hours.  Invalid input(s): FREET3 Anemia work up: No results for input(s): VITAMINB12, FOLATE, FERRITIN, TIBC, IRON, RETICCTPCT in the last 72 hours. Sepsis Labs:  Recent Labs Lab 11/11/16 0947  WBC 36.3*   Microbiology No results found for this or any previous visit (from the past 240 hour(s)).   Medications:   . dexamethasone  4 mg Intravenous Q12H  . feeding supplement (ENSURE ENLIVE)  237 mL Oral BID BM  . fentaNYL  25 mcg Transdermal Q72H  . heparin  5,000 Units Subcutaneous BID  . insulin aspart  0-24 Units Subcutaneous TID WC  . latanoprost  1 drop Both Eyes QHS  . levothyroxine  88 mcg Oral QAC breakfast  . senna-docusate  1 tablet Oral BID   Continuous Infusions: . sodium chloride 1,000 mL (11/12/16 0931)  . ondansetron (ZOFRAN) IV      Time spent: 25 min   LOS: 1 day   Charlynne Cousins  [facility] Pager [phone]  *Please refer to [email or web address], password [email or web address] to get updated schedule on who will round on  this patient, as hospitalists switch teams weekly. If 7PM-7AM, please contact night-coverage at [email or web address], password [email or web address] for any overnight needs.  11/12/2016, 3:37 PM

## 2016-11-12 NOTE — Progress Notes (Signed)
Report given to Beola Cord nurse at [facility].

## 2016-11-12 NOTE — Progress Notes (Signed)
Acknowledged CSW consult. Pt from [facility]. Pt known to CSW from previous hospital admission. Spoke with pt's daughter Seth Bake (legal guardian). She states she is at this time unsure if DC plan will be to return to rehab. States she is awaiting pt's medical prognosis.   CSW will follow for DC planning needs.  Sharren Bridge, MSW, LCSW Clinical Social Work 11/12/2016 [phone]

## 2016-11-12 NOTE — Progress Notes (Signed)
The patient's daughter has expressed concern about leaving her mother on [address] [place] unit and requested the patient to be moved to ICU I have placed a stat consult to critical care physician who has done extensive evaluation and felt that the patient does not need ICU care. Ultimately, I spoke with the hospitalist who has graciously accepted the patient and will move the patient to a stepdown unit for intensive monitoring while awaiting bed from [facility] I have called over to [facility] transfer center and updated the operator about potential transfer to stepdown unit and have given them phone numbers to call once the bed is available

## 2016-11-12 NOTE — Progress Notes (Signed)
Patient d/c to [facility] via [place]. Patient is stable. Denies pain. Daughter present,appreciative with the care given by nurse.

## 2016-11-12 NOTE — Progress Notes (Signed)
Initial Nutrition Assessment  DOCUMENTATION CODES:   Non-severe (moderate) malnutrition in context of chronic illness, Underweight  INTERVENTION:   -Encourage PO intake  NUTRITION DIAGNOSIS:   Malnutrition (Moderate) related to chronic illness, cancer and cancer related treatments, dysphagia as evidenced by moderate depletion of body fat, moderate depletions of muscle mass, percent weight loss.  GOAL:   Patient will meet greater than or equal to 90% of their needs  MONITOR:   PO intake, Supplement acceptance, Labs, Weight trends, Skin, I & O's  REASON FOR ASSESSMENT:   Malnutrition Screening Tool    ASSESSMENT:   83 y.o female amitted with Impeding airway obstruction with stridor, leukocytosis, difficulties clearing secretions, uncontrolled pain. H/o cancer of anterior 2/3 tongue with rapidly growing tumor of left neck and jaw region requiring radiation.  Patient just seen by nutrition team 5/7 during previous admission. Since that admission patient has lost additional weight and continues to eat poorly. Pt was scheduled for palliative chemotherapy 5/25.  Pt has been ordered Ensure supplements and does not like these. Pt has history of not liking hospital food.   Per chart review, pt has lost 17 lbs since 5/2 (16% wt loss x 3 weeks, significant for time frame). Nutrition-Focused physical exam completed. Findings are moderate fat depletion, moderate muscle depletion, and no edema.  Per oncology, pt to discharge today. Summary has been placed in chart. Will not order supplements d/t pending discharge.  Medications: IV Decadron every 12 hours, Senokot-S tablet BID Labs reviewed: CBGs: 133-173 Low Mg  Diet Order:  DIET DYS 3 Room service appropriate? Yes; Fluid consistency: Thin  Skin:  Wound (see comment) (non-pressure wound on face)  Last BM:  5/22  Height:   Ht Readings from Last 1 Encounters:  11/11/16 4\' 11"  (1.499 m)    Weight:   Wt Readings from Last 1  Encounters:  11/11/16 90 lb 9.7 oz (41.1 kg)    Ideal Body Weight:  44.7 kg  BMI:  Body mass index is 18.3 kg/m.  Estimated Nutritional Needs:   Kcal:  1450-1650  Protein:  60-70g  Fluid:  1.5L/day  EDUCATION NEEDS:   No education needs identified at this time  Clayton Bibles, MS, RD, LDN Pager: [phone] After Hours Pager: [phone]

## 2016-11-12 NOTE — Discharge Summary (Signed)
Physician Discharge Summary  Patient ID: [name] MRN: [id number] DOB/AGE: 1934-04-19 83 y.o.  Admit date: 11/11/2016 Discharge date: 11/12/2016  Primary Care Physician:  Charolette Forward, MD   Discharge Diagnoses:    Present on Admission: . Cancer of anterior two-thirds of tongue ([place]) . Dementia due to another medical condition . Severe malnutrition ([place]) . Leukocytosis . Metastasis to lymph nodes ([place]) . Aspiration pneumonia [facility])   Discharge Medications:  Allergies as of 11/12/2016      Reactions   Tuberculin Tests Other (See Comments)   UNSPECIFIED REACTION  Pt is allergic to PPD (The PPD skin test is a method used to diagnose silent (latent) tuberculosis (TB) infection. PPD stands for purified protein derivative.) Pt should receive chest x-rays instead.      Medication List    STOP taking these medications   amLODipine 10 MG tablet Commonly known as:  NORVASC   fentaNYL 12 MCG/HR Commonly known as:  DURAGESIC - dosed mcg/hr Replaced by:  fentaNYL 25 MCG/HR patch   glimepiride 1 MG tablet Commonly known as:  AMARYL   hydrALAZINE 10 MG tablet Commonly known as:  APRESOLINE   metFORMIN 500 MG 24 hr tablet Commonly known as:  GLUCOPHAGE-XR     TAKE these medications   fentaNYL 25 MCG/HR patch Commonly known as:  DURAGESIC - dosed mcg/hr Place 1 patch (25 mcg total) onto the skin every 3 (three) days. Replaces:  fentaNYL 12 MCG/HR   folic acid 1 MG tablet Commonly known as:  FOLVITE Take 1 mg by mouth daily.   latanoprost 0.005 % ophthalmic solution Commonly known as:  XALATAN Place 1 drop into both eyes at bedtime.   levothyroxine 88 MCG tablet Commonly known as:  SYNTHROID, LEVOTHROID Take 88 mcg by mouth daily before breakfast.   ondansetron 8 MG disintegrating tablet Commonly known as:  ZOFRAN ODT Take 1 tablet (8 mg total) by mouth every 8 (eight) hours as needed for nausea or vomiting (may start on day 3 after each chemo).    prochlorperazine 10 MG tablet Commonly known as:  COMPAZINE Take 1 tablet (10 mg total) by mouth every 6 (six) hours as needed for nausea or vomiting.   senna-docusate 8.6-50 MG tablet Commonly known as:  Senokot-S Take 1 tablet by mouth 2 (two) times daily.       Disposition and Follow-up:   Significant Diagnostic Studies:  Dg Chest 2 View  Result Date: 11/11/2016 CLINICAL DATA:  Low blood pressure.  Cough.  COPD. EXAM: CHEST  2 VIEW COMPARISON:  10/23/2016. FINDINGS: Right PICC line noted with tip projected over superior vena cava. Heart size normal. Interval clearing of right lung infiltrate. No pleural effusion or pneumothorax. IMPRESSION: 1. Right PICC line in good anatomic position. 2. Interval clearing of right pulmonary infiltrate. Electronically Signed   By: Marcello Moores  Register   On: 11/11/2016 11:49   X-ray Chest Pa And Lateral  Result Date: 10/20/2016 CLINICAL DATA:  83 year old female with dry cough. EXAM: CHEST  2 VIEW COMPARISON:  January 31, 2013 FINDINGS: The heart size is mildly enlarged. The hila and mediastinum are normal. No pulmonary nodules, masses, or focal infiltrates. No acute abnormalities. IMPRESSION: No active cardiopulmonary disease. Electronically Signed   By: Dorise Bullion III M.D   On: 10/20/2016 20:02   Ct Soft Tissue Neck W Contrast  Addendum Date: 11/11/2016   ADDENDUM REPORT: 11/11/2016 17:04 ADDENDUM: Critical Value/emergent results were called by telephone at the time of interpretation on 11/11/2016 at 1658  hours to Dr. Burr Medico, who verbally acknowledged these results. Electronically Signed   By: Genevie Ann M.D.   On: 11/11/2016 17:04   Result Date: 11/11/2016 CLINICAL DATA:  83 year old female with invasive squamous cell carcinoma. Massive necrotic tumor in the neck. Impending airway obstruction with stridor. Chemotherapy ongoing. EXAM: CT NECK WITH CONTRAST TECHNIQUE: Multidetector CT imaging of the neck was performed using the standard protocol following the  bolus administration of intravenous contrast. CONTRAST:  24mL ISOVUE-300 IOPAMIDOL (ISOVUE-300) INJECTION 61% COMPARISON:  Neck CT 10/19/2016 and earlier. FINDINGS: Pharynx and larynx: Negative nasopharynx. Progressive compression of the oropharynx and hypopharynx since 10/19/2016. Critical appearing airway stenosis now at the level of the hypopharynx as seen on series 2, image 59. Continued mass effect on the larynx, especially the left side, but the laryngeal airway patency is much better preserved, and not significantly changed since April. The subglottic trachea appears normal aside from small volume retained secretions. Salivary glands: Sublingual and submandibular spaces distorted by progressive massive heterogeneously enhancing soft tissue tumor. Infiltration of the inferior parotid spaces worse on the left. Thyroid: Stable and negative; dystrophic calcifications in the right lobe. Lymph nodes: The massive 8.5 cm rounded necrotic tumor occupying the anterior left lower neck has progressed to up to 10.3 cm diameter now (series 2, image 76 today versus series 2, image 59 in April). The more thickened and variegated tumor engulfing the angle of the mandible and extending caudally has enlarged up to 6.5 cm now (previously 4.9 cm). Associated enlargement of other bilateral smaller yet extensive necrotic nodal and ex nodal disease, including in the left retropharyngeal space at the level of the oropharynx (series 2, image 42). Vascular: Tumor thrombus within the left internal jugular vein extends farther into the left innominate vein now (series 2, image 110) but the left subclavian and innominate veins remain patent. Right IJ approach PICC line is visible. Other major vascular structures in the neck and at the skullbase remain patent. There is superimposed atherosclerosis. Limited intracranial: Visible brain parenchyma remains normal. Visualized orbits: Negative. Mastoids and visualized paranasal sinuses: Clear.  Skeleton: No bone destruction or suspicious osseous lesion identified in the neck. Upper chest: Visible lung parenchyma is stable and clear; a small left upper lobe lung nodule on series 3, image 30 is unchanged since November 2017 and appears benign. No superior mediastinal lymphadenopathy. Calcified aortic atherosclerosis. Left innominate vein thrombus described above. IMPRESSION: 1. Progression of bulky necrotic tumor and nodal metastases with extracapsular extension throughout the bilateral neck. 2. Progressed and critical appearing airway stenosis at the level of the hypopharynx (series 2, image 59). By comparison laryngeal and subglottic tracheal patency is relatively normal. 3. Progressed tumor thrombus extending from the left IJ into the left innominate vein, but the left subclavian and innominate veins remain patent. Electronically Signed: By: Genevie Ann M.D. On: 11/11/2016 16:56   Ct Soft Tissue Neck W Contrast  Result Date: 10/19/2016 CLINICAL DATA:  83 y/o  F; EXAM: CT NECK WITH CONTRAST TECHNIQUE: Multidetector CT imaging of the neck was performed using the standard protocol following the bolus administration of intravenous contrast. CONTRAST:  63mL ISOVUE-300 IOPAMIDOL (ISOVUE-300) INJECTION 61% COMPARISON:  10/06/2016 PET-CT.  09/22/2016 CT of the neck. FINDINGS: Pharynx and larynx: Mass effect upon the airway which is displaced rightward and moderately narrowed at the level of the hypopharynx measuring 2.1 x 0.4 cm. Salivary glands: No inflammation, mass, or stone. Thyroid: Multiple thyroid nodules including a densely calcified nodule in right lobe of thyroid are  stable. Lymph nodes: Massive necrotic cervical lymphadenopathy of right 2,3,5 and left 2,3,4,5 lymph node stations markedly increased in size when compared with prior studies, for example a left-sided 2/3 level node measures 8.5 x 7.9 x 7.5 cm (AP x ML x CC series 2: Image 54 and 6:69), previously up to 5.3 cm. The necrotic lymph nodes have  ill-defined margins with surrounding fat stranding indicating extranodal extension. The large right upper cervical lymph node at angle of mandible invades the right masticator space and has an ill-defined margin with the right submandibular gland. Go the largest left upper cervical lymph node extends medially into the left carotid space, parapharyngeal space on, and has an ill-defined margin with both the left submandibular gland and parotid gland. Vascular: Left internal jugular vein thrombus, suspected tumor thrombus, now extends to the junction with the left brachiocephalic vein inferiorly and the craniocervical junction superiorly, progressed from prior studies. Limited intracranial: Negative. Visualized orbits: Negative. Mastoids and visualized paranasal sinuses: Clear. Skeleton: Stable cervical spondylosis. No high-grade bony canal stenosis. No destructive osseous lesion is identified. Upper chest: Stable left upper lobe 4 mm nodule (series 2, image 88). Other: None. IMPRESSION: 1. Massive necrotic cervical lymphadenopathy is increased in size. For example, the largest left-sided node measures up to 8.5 cm, previously 5.3 cm. 2. Findings of extranodal extension of lymphadenopathy and probable invasion of the left internal jugular vein now extending to junction with left brachiocephalic vein and craniocervical junction. 3. Mass effect on the airway which is displaced rightward and moderately narrow the level of hypopharynx. 4. Stable left upper lobe 4 mm nodule. Electronically Signed   By: Kristine Garbe M.D.   On: 10/19/2016 17:54   Ir Fluoro Guide Cv Line Right  Result Date: 10/22/2016 INDICATION: Patient with history of tongue cancer; central venous access requested for chemotherapy. EXAM: RIGHT UPPER EXTREMITY PICC LINE PLACEMENT WITH ULTRASOUND AND FLUOROSCOPIC GUIDANCE MEDICATIONS: None ANESTHESIA/SEDATION: None FLUOROSCOPY TIME:  Fluoroscopy Time:  12 seconds COMPLICATIONS: None immediate.  PROCEDURE: The patient/family was advised of the possible risks and complications and agreed to undergo the procedure. The patient was then brought to the angiographic suite for the procedure. The right arm was prepped with chlorhexidine, draped in the usual sterile fashion using maximum barrier technique (cap and mask, sterile gown, sterile gloves, large sterile sheet, hand hygiene and cutaneous antisepsis) and infiltrated locally with 1% Lidocaine. Ultrasound demonstrated patency of the right basilic vein, and this was documented with an image. Under real-time ultrasound guidance, this vein was accessed with a 21 gauge micropuncture needle and image documentation was performed. A 0.018 wire was introduced in to the vein. Over this, a 5 [place] double lumen power injectable PICC was advanced to the lower SVC/right atrial junction. Fluoroscopy during the procedure and fluoro spot radiograph confirms appropriate catheter position. The catheter was flushed and covered with a sterile dressing. Catheter length: 36 cm IMPRESSION: Successful right arm power PICC line placement with ultrasound and fluoroscopic guidance. The catheter is ready for use. Read by: Rowe Robert, PA-C Electronically Signed   By: Sandi Mariscal M.D.   On: 10/22/2016 11:49   Ir US Guide Vasc Access Right  Result Date: 10/22/2016 INDICATION: Patient with history of tongue cancer; central venous access requested for chemotherapy. EXAM: RIGHT UPPER EXTREMITY PICC LINE PLACEMENT WITH ULTRASOUND AND FLUOROSCOPIC GUIDANCE MEDICATIONS: None ANESTHESIA/SEDATION: None FLUOROSCOPY TIME:  Fluoroscopy Time:  12 seconds COMPLICATIONS: None immediate. PROCEDURE: The patient/family was advised of the possible risks and complications and agreed to  undergo the procedure. The patient was then brought to the angiographic suite for the procedure. The right arm was prepped with chlorhexidine, draped in the usual sterile fashion using maximum barrier technique (cap and  mask, sterile gown, sterile gloves, large sterile sheet, hand hygiene and cutaneous antisepsis) and infiltrated locally with 1% Lidocaine. Ultrasound demonstrated patency of the right basilic vein, and this was documented with an image. Under real-time ultrasound guidance, this vein was accessed with a 21 gauge micropuncture needle and image documentation was performed. A 0.018 wire was introduced in to the vein. Over this, a 5 [place] double lumen power injectable PICC was advanced to the lower SVC/right atrial junction. Fluoroscopy during the procedure and fluoro spot radiograph confirms appropriate catheter position. The catheter was flushed and covered with a sterile dressing. Catheter length: 36 cm IMPRESSION: Successful right arm power PICC line placement with ultrasound and fluoroscopic guidance. The catheter is ready for use. Read by: Rowe Robert, PA-C Electronically Signed   By: Sandi Mariscal M.D.   On: 10/22/2016 11:49   Dg Chest Port 1 View  Result Date: 10/23/2016 CLINICAL DATA:  Cough and shortness of breath EXAM: PORTABLE CHEST 1 VIEW COMPARISON:  Oct 20, 2016 FINDINGS: There is extensive airspace consolidation throughout much of the right lung. There is a degree of superimposed pleural effusion on the right. Left lung is clear. Heart is upper normal in size with pulmonary vascularity normal. There is aortic atherosclerosis. No evident adenopathy. Central catheter tip is at cavoatrial junction. No pneumothorax. Calcification is noted in the right carotid artery with what appears to be dilatation in the right carotid artery measuring 1.3 cm. IMPRESSION: Extensive airspace consolidation throughout much the right lung with superimposed right pleural effusion. Suspect widespread right lung pneumonia. Left lung clear. Stable cardiac silhouette. Aortic atherosclerosis. Calcification in the right carotid artery with apparent aneurysmal dilatation, calcified, in the right carotid artery. Electronically Signed    By: Lowella Grip III M.D.   On: 10/23/2016 07:20   Dg Swallowing Func-speech Pathology  Result Date: 10/21/2016 Objective Swallowing Evaluation: Type of Study: MBS-Modified Barium Swallow Study Patient Details Name: DAKOTAH HEIMAN MRN: [id number] Date of Birth: 03-Jan-1934 Today's Date: 10/21/2016 Time: SLP Start Time (ACUTE ONLY): 1457-SLP Stop Time (ACUTE ONLY): 5956 SLP Time Calculation (min) (ACUTE ONLY): 20 min Past Medical History: Past Medical History: Diagnosis Date . Abdominal pain  . Blood dyscrasia   HYPOKALEMIA . Blood transfusion  . COPD (chronic obstructive pulmonary disease) ([place])  . Dementia   pathologic aging and dementia per daughter . Diabetes mellitus   Type II . Heart murmur  . History of blood transfusion  . Hypertension  . Hypothyroid  . Neuropathy  . Substance abuse   ETOH with dementia and prior encephalopathy and withdrawal . Tongue cancer ([place]) 05/13/2016 . Tricuspid regurgitation 2012  mild . Urinary tract infection  Past Surgical History: Past Surgical History: Procedure Laterality Date . ABDOMINAL HYSTERECTOMY    1970's . BREAST SURGERY Left   Lumpectomy . COLONOSCOPY   . ESOPHAGOGASTRODUODENOSCOPY  05/17/2011  Procedure: ESOPHAGOGASTRODUODENOSCOPY (EGD);  Surgeon: Lafayette Dragon, MD;  Location: [facility] ENDOSCOPY;  Service: Endoscopy;  Laterality: N/A; . EXCISION OF TONGUE LESION Left 05/13/2016  Procedure: WIDE LOCAL EXCISION OF LEFT LATERAL TONGUE CANCER;  Surgeon: Jerrell Belfast, MD;  Location: [facility];  Service: ENT;  Laterality: Left; . EXCISION OF TONGUE LESION Left 06/12/2016  Procedure: RE-EXCISION OF LEFT  TONGUE CANCER;  Surgeon: Jerrell Belfast, MD;  Location: [facility];  Service: ENT;  Laterality: Left; . EYE SURGERY Bilateral   Cataract . TONGUE SURGERY  05/13/2016  WIDE LOCAL EXCISION OF LEFT LATERAL TONGUE CANCER (Left) with Local Soft Tissue Reconstruction  . TONSILLECTOMY AND ADENOIDECTOMY   HPI: Pt is an 83 yo female adm to [facility] with h/o cancer of anterior 2/3 tongue s/p  surgery 05/2016 and now has rapidly growing tumor in her left neck and jaw region for which she is scheduled to have radiation.   Massive neck/cervical lymphadenopathy 8.5 cm with invasion of IJ with tumor thrombus per MD note.   Pt with left supraglottic larynx/pharynx, right submandibular - node 4 cm.  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  Swallow evaluation ordered.   Pt denies ever having xray swallow study done before.  Subjective: pt awake in chair Assessment / Plan / Recommendation CHL IP CLINICAL IMPRESSIONS 10/21/2016 Clinical Impression Pt tested in lateral and A-P view.  Pt presents with mild oropharyngeal dysphagia without aspiration of any consistency tested (thin, nectar, pudding, cracker and barium tablet). Decreased oral manipulation due to pt's lingual surgery noted.  Pharyngeal swallow was overall strong without significant residuals/aspiration.  Very trace laryngeal penetration of thin liquid secondary to decreased timing of laryngeal closure cleared indpendently.  Of note, pt voice was clear prior to and during MBS - she reports wet voice occuring AFTER meals.  Pt is likely having aspiration of secretions but was protective of her airway with MBS.   Using live imaging, educated pt to findings/recommendations.   When Md indicates, recommend advance to dys3/thin due to oral deficits with strict precautions. SlP to follow briefly for pt education/po toelrance.  Thanks for this consult.  SLP Visit Diagnosis Dysphagia, oropharyngeal phase (R13.12) Attention and concentration deficit following -- Frontal lobe and executive function deficit following -- Impact on safety and function Mild aspiration risk   CHL IP TREATMENT RECOMMENDATION 10/21/2016 Treatment Recommendations Therapy as outlined in treatment plan below   Prognosis 10/21/2016 Prognosis for Safe Diet Advancement Fair Barriers to Reach Goals Severity of deficits;Time  post onset Barriers/Prognosis Comment -- CHL IP DIET RECOMMENDATION 10/21/2016 SLP Diet Recommendations -- Liquid Administration via Straw Medication Administration Whole meds with liquid Compensations Slow rate;Small sips/bites;Follow solids with liquid Postural Changes Remain semi-upright after after feeds/meals (Comment);Seated upright at 90 degrees   CHL IP OTHER RECOMMENDATIONS 10/21/2016 Recommended Consults -- Oral Care Recommendations Oral care BID Other Recommendations --   CHL IP FOLLOW UP RECOMMENDATIONS 10/21/2016 Follow up Recommendations (No Data)   CHL IP FREQUENCY AND DURATION 10/21/2016 Speech Therapy Frequency (ACUTE ONLY) min 1 x/week Treatment Duration 1 week      CHL IP ORAL PHASE 10/21/2016 Oral Phase Impaired Oral - Pudding Teaspoon -- Oral - Pudding Cup -- Oral - Honey Teaspoon -- Oral - Honey Cup -- Oral - Nectar Teaspoon -- Oral - Nectar Cup Weak lingual manipulation Oral - Nectar Straw Weak lingual manipulation Oral - Thin Teaspoon Weak lingual manipulation Oral - Thin Cup Weak lingual manipulation Oral - Thin Straw Weak lingual manipulation Oral - Puree Weak lingual manipulation;Reduced posterior propulsion Oral - Mech Soft Weak lingual manipulation;Impaired mastication;Reduced posterior propulsion Oral - Regular -- Oral - Multi-Consistency -- Oral - Pill Weak lingual manipulation;Reduced posterior propulsion Oral Phase - Comment --  CHL IP PHARYNGEAL PHASE 10/21/2016 Pharyngeal Phase Impaired Pharyngeal- Pudding Teaspoon -- Pharyngeal -- Pharyngeal- Pudding Cup -- Pharyngeal -- Pharyngeal- Honey Teaspoon -- Pharyngeal --  Pharyngeal- Honey Cup -- Pharyngeal -- Pharyngeal- Nectar Teaspoon -- Pharyngeal -- Pharyngeal- Nectar Cup WFL Pharyngeal -- Pharyngeal- Nectar Straw WFL Pharyngeal -- Pharyngeal- Thin Teaspoon WFL Pharyngeal -- Pharyngeal- Thin Cup WFL Pharyngeal -- Pharyngeal- Thin Straw WFL Pharyngeal -- Pharyngeal- Puree WFL Pharyngeal -- Pharyngeal- Mechanical Soft WFL Pharyngeal -- Pharyngeal-  Regular -- Pharyngeal -- Pharyngeal- Multi-consistency -- Pharyngeal -- Pharyngeal- Pill WFL Pharyngeal -- Pharyngeal Comment --  CHL IP CERVICAL ESOPHAGEAL PHASE 10/21/2016 Cervical Esophageal Phase Impaired Pudding Teaspoon -- Pudding Cup -- Honey Teaspoon -- Honey Cup -- Nectar Teaspoon -- Nectar Cup -- Nectar Straw -- Thin Teaspoon -- Thin Cup -- Thin Straw -- Puree -- Mechanical Soft -- Regular -- Multi-consistency -- Pill -- Cervical Esophageal Comment barium tablet appeared to lodge at mid-esophagus without awareness, further bolus of pudding did not faciliate clearance however multiple boluses of liquids effective;  radiologist is not present and MBS is not designed to examine below UES - if MD deems indicated, pt may benefit from dedicated esophageal evaluation No flowsheet data found. Luanna Salk, [place] [facility] SLP [phone]               Discharge Laboratory Values: Lab Results  Component Value Date   WBC 36.3 (H) 11/11/2016   HGB 10.9 (L) 11/11/2016   HCT 33.2 (L) 11/11/2016   MCV 82.4 11/11/2016   PLT 263 11/11/2016   Lab Results  Component Value Date   NA 145 11/11/2016   K 3.6 11/11/2016   CL 100 (L) 10/30/2016   CO2 27 11/11/2016    Brief H and P: For complete details please refer to admission H and P, but in brief, the patient was admitted to the hospital for management of airway stridor.  She had CT scan done on 11/11/2016 which showed airway compromise.  Her CODE STATUS is DO NOT RESUSCITATE.  She was given oxygen, nebulizer treatment, steroid treatment with stable vital signs and adequate oxygenation. After extensive discussion with the patient's daughter, who is the dedicated medical healthcare power of attorney, I have requested the patient to be transferred to [facility]. Dr. Mahlon Gammon from [place] [facility] ICU has tentatively accepted the patient's transfer, pending bed availability.  Per patient's daughter request, I have also consulted pulmonologist to consider whether  it is appropriate for the patient to be moved to stepdown unit.  Physical Exam at Discharge: BP 99/74 (BP Location: Left Arm)   Pulse 78   Temp 98 F (36.7 C) (Oral)   Resp 18   Ht 4\' 11"  (1.499 m)   Wt 90 lb 9.7 oz (41.1 kg)   LMP 05/17/1983   SpO2 92%   BMI 18.30 kg/m  GENERAL:alert, no distress and comfortable SKIN: skin color, texture, turgor are normal, no rashes or significant lesions EYES: normal, Conjunctiva are pink and non-injected, sclera clear OROPHARYNX:no exudate, no erythema and lips, buccal mucosa, and tongue normal  NECK: supple, thyroid normal size, non-tender, without nodularity LYMPH: She has large bilateral neck lymphadenopathy.    LUNGS: clear to auscultation and percussion with mild increased work of breathing breathing. Stridor is noted HEART: regular rate & rhythm and no murmurs and no lower extremity edema ABDOMEN:abdomen soft, non-tender and normal bowel sounds Musculoskeletal:no cyanosis of digits and no clubbing  NEURO: alert & oriented x 3 with mild dysarthria, no focal motor/sensory deficits  Hospital Course:  Active Problems:   Cancer of anterior two-thirds of tongue ([place])   Leukocytosis   Severe malnutrition ([facility])   Dementia due  to another medical condition   Metastasis to lymph nodes ([facility])   DM (diabetes mellitus) ([place])   Aspiration pneumonia ([place])   Diet:  Regular  Activity:  As tolerated  Condition at Discharge:   stable  Signed: Dr. Heath Lark [phone]  I have spent over 1 hour on discharge today  11/12/2016, 1:41 PM

## 2016-11-12 NOTE — Progress Notes (Signed)
Name: [name] MRN: [id number] DOB: [birth year]    ADMISSION DATE:  11/11/2016 CONSULTATION DATE:  11/12/2016  REFERRING MD :  Alvy Bimler  CHIEF COMPLAINT:  Stridor and service recovery  BRIEF PATIENT DESCRIPTION: 83 year old female with advanced tongue cancer with very large local invasion presented to the hospital with hypercalcemia, hypokalemia and concern for airway compromise.  Patient's daughter has been very unhappy with nursing care and asked for transfer to the ICU for closer nursing care.  PCCM consulted for transfer to the ICU.    SIGNIFICANT EVENTS  5/23 admission for above  STUDIES:  CT of the neck that I reviewed myself, very large mass noted with airway compromise.   HISTORY OF PRESENT ILLNESS:  83 year old female with advanced tongue cancer with very large local invasion presented to the hospital with hypercalcemia, hypokalemia and concern for airway compromise.  Patient's daughter has been very unhappy with nursing care and asked for transfer to the ICU for closer nursing care.  PCCM consulted for transfer to the ICU.    PAST MEDICAL HISTORY :   has a past medical history of Abdominal pain; Blood dyscrasia; Blood transfusion; COPD (chronic obstructive pulmonary disease) ([place]); Dementia; Diabetes mellitus; Heart murmur; History of blood transfusion; Hypertension; Hypothyroid; Neuropathy; Substance abuse; Tongue cancer ([place]) (05/13/2016); Tricuspid regurgitation (2012); and Urinary tract infection.  has a past surgical history that includes Esophagogastroduodenoscopy (05/17/2011); Colonoscopy; Eye surgery (Bilateral); Abdominal hysterectomy; Breast surgery (Left); Tongue surgery (05/13/2016); Tonsillectomy and adenoidectomy; Excision of tongue lesion (Left, 05/13/2016); Excision of tongue lesion (Left, 06/12/2016); IR US Guide Vasc Access Right (10/22/2016); and IR Fluoro Guide CV Line Right (10/22/2016). Prior to Admission medications   Medication Sig Start Date End Date  Taking? Authorizing Provider  amLODipine (NORVASC) 10 MG tablet Take 1 tablet (10 mg total) by mouth daily. 10/31/16   Heath Lark, MD  fentaNYL (DURAGESIC - DOSED MCG/HR) 25 MCG/HR patch Place 1 patch (25 mcg total) onto the skin every 3 (three) days. 11/12/16   Heath Lark, MD  folic acid (FOLVITE) 1 MG tablet Take 1 mg by mouth daily.    [provider]  glimepiride (AMARYL) 1 MG tablet Take 1 mg by mouth daily with breakfast.    [provider]  hydrALAZINE (APRESOLINE) 10 MG tablet Take 1 tablet (10 mg total) by mouth every 8 (eight) hours. 10/30/16   Heath Lark, MD  latanoprost (XALATAN) 0.005 % ophthalmic solution Place 1 drop into both eyes at bedtime.    [provider]  levothyroxine (SYNTHROID, LEVOTHROID) 88 MCG tablet Take 88 mcg by mouth daily before breakfast.    [provider]  metFORMIN (GLUCOPHAGE-XR) 500 MG 24 hr tablet Take 500 mg by mouth daily after supper.     [provider]  ondansetron (ZOFRAN ODT) 8 MG disintegrating tablet Take 1 tablet (8 mg total) by mouth every 8 (eight) hours as needed for nausea or vomiting (may start on day 3 after each chemo). 11/06/16   Heath Lark, MD  prochlorperazine (COMPAZINE) 10 MG tablet Take 1 tablet (10 mg total) by mouth every 6 (six) hours as needed for nausea or vomiting. 11/06/16   Heath Lark, MD  senna-docusate (SENOKOT-S) 8.6-50 MG tablet Take 1 tablet by mouth 2 (two) times daily. 10/30/16   Heath Lark, MD   Allergies  Allergen Reactions  . Tuberculin Tests Other (See Comments)    UNSPECIFIED REACTION  Pt is allergic to PPD (The PPD skin test is a method  used to diagnose silent (latent) tuberculosis (TB) infection. PPD stands for purified protein derivative.) Pt should receive chest x-rays instead.    FAMILY HISTORY:  family history includes Stroke in her mother. SOCIAL HISTORY:  reports that she has been smoking Cigarettes.  She has a 15.00 pack-year smoking history. She has never  used smokeless tobacco. She reports that she drinks alcohol. She reports that she does not use drugs.  REVIEW OF SYSTEMS:   Constitutional: Negative for fever, chills, weight loss, malaise/fatigue and diaphoresis.  HENT: Negative for hearing loss, ear pain, nosebleeds, congestion, sore throat, neck pain, tinnitus and ear discharge.   Eyes: Negative for blurred vision, double vision, photophobia, pain, discharge and redness.  Respiratory: Negative for cough, hemoptysis, sputum production, shortness of breath, wheezing and stridor.   Cardiovascular: Negative for chest pain, palpitations, orthopnea, claudication, leg swelling and PND.  Gastrointestinal: Negative for heartburn, nausea, vomiting, abdominal pain, diarrhea, constipation, blood in stool and melena.  Genitourinary: Negative for dysuria, urgency, frequency, hematuria and flank pain.  Musculoskeletal: Negative for myalgias, back pain, joint pain and falls.  Skin: Negative for itching and rash.  Neurological: Negative for dizziness, tingling, tremors, sensory change, speech change, focal weakness, seizures, loss of consciousness, weakness and headaches.  Endo/Heme/Allergies: Negative for environmental allergies and polydipsia. Does not bruise/bleed easily.  SUBJECTIVE:   VITAL SIGNS: Temp:  [98 F (36.7 C)-98.8 F (37.1 C)] 98.7 F (37.1 C) (05/24 1424) Pulse Rate:  [78-91] 91 (05/24 1424) Resp:  [12-18] 12 (05/24 1424) BP: (98-151)/(74-86) 151/86 (05/24 1424) SpO2:  [92 %-100 %] 100 % (05/24 1424)  PHYSICAL EXAMINATION: General:  Chronically ill appearing elderly female with very large bilateral neck masses Neuro:  Alert and interactive, moving all ext to command.  Slurred speech due to anatomy not neuro. HEENT:  [place]/AT, PERRL, EOM-I and MMM.  Neck as above Cardiovascular:  RRR, Nl S1/S2, -M/R/G. Lungs:  Transmitted upper airway sounds Abdomen:  Soft, NT, ND and +B Musculoskeletal:  -edema and -tenderness Skin:  Wound on right  neck, enlarged and stretched skin on left neck, otherwise intact.   Recent Labs Lab 11/11/16 0947  NA 145  K 3.6  CO2 27  BUN 28.0*  CREATININE 1.1  GLUCOSE 230*    Recent Labs Lab 11/11/16 0947  HGB 10.9*  HCT 33.2*  WBC 36.3*  PLT 263   Dg Chest 2 View  Result Date: 11/11/2016 CLINICAL DATA:  Low blood pressure.  Cough.  COPD. EXAM: CHEST  2 VIEW COMPARISON:  10/23/2016. FINDINGS: Right PICC line noted with tip projected over superior vena cava. Heart size normal. Interval clearing of right lung infiltrate. No pleural effusion or pneumothorax. IMPRESSION: 1. Right PICC line in good anatomic position. 2. Interval clearing of right pulmonary infiltrate. Electronically Signed   By: Marcello Moores  Register   On: 11/11/2016 11:49   Ct Soft Tissue Neck W Contrast  Addendum Date: 11/11/2016   ADDENDUM REPORT: 11/11/2016 17:04 ADDENDUM: Critical Value/emergent results were called by telephone at the time of interpretation on 11/11/2016 at 1658 hours to Dr. Burr Medico, who verbally acknowledged these results. Electronically Signed   By: Genevie Ann M.D.   On: 11/11/2016 17:04   Result Date: 11/11/2016 CLINICAL DATA:  83 year old female with invasive squamous cell carcinoma. Massive necrotic tumor in the neck. Impending airway obstruction with stridor. Chemotherapy ongoing. EXAM: CT NECK WITH CONTRAST TECHNIQUE: Multidetector CT imaging of the neck was performed using the standard protocol following the bolus administration of intravenous contrast. CONTRAST:  11mL  ISOVUE-300 IOPAMIDOL (ISOVUE-300) INJECTION 61% COMPARISON:  Neck CT 10/19/2016 and earlier. FINDINGS: Pharynx and larynx: Negative nasopharynx. Progressive compression of the oropharynx and hypopharynx since 10/19/2016. Critical appearing airway stenosis now at the level of the hypopharynx as seen on series 2, image 59. Continued mass effect on the larynx, especially the left side, but the laryngeal airway patency is much better preserved, and not  significantly changed since April. The subglottic trachea appears normal aside from small volume retained secretions. Salivary glands: Sublingual and submandibular spaces distorted by progressive massive heterogeneously enhancing soft tissue tumor. Infiltration of the inferior parotid spaces worse on the left. Thyroid: Stable and negative; dystrophic calcifications in the right lobe. Lymph nodes: The massive 8.5 cm rounded necrotic tumor occupying the anterior left lower neck has progressed to up to 10.3 cm diameter now (series 2, image 76 today versus series 2, image 59 in April). The more thickened and variegated tumor engulfing the angle of the mandible and extending caudally has enlarged up to 6.5 cm now (previously 4.9 cm). Associated enlargement of other bilateral smaller yet extensive necrotic nodal and ex nodal disease, including in the left retropharyngeal space at the level of the oropharynx (series 2, image 42). Vascular: Tumor thrombus within the left internal jugular vein extends farther into the left innominate vein now (series 2, image 110) but the left subclavian and innominate veins remain patent. Right IJ approach PICC line is visible. Other major vascular structures in the neck and at the skullbase remain patent. There is superimposed atherosclerosis. Limited intracranial: Visible brain parenchyma remains normal. Visualized orbits: Negative. Mastoids and visualized paranasal sinuses: Clear. Skeleton: No bone destruction or suspicious osseous lesion identified in the neck. Upper chest: Visible lung parenchyma is stable and clear; a small left upper lobe lung nodule on series 3, image 30 is unchanged since November 2017 and appears benign. No superior mediastinal lymphadenopathy. Calcified aortic atherosclerosis. Left innominate vein thrombus described above. IMPRESSION: 1. Progression of bulky necrotic tumor and nodal metastases with extracapsular extension throughout the bilateral neck. 2.  Progressed and critical appearing airway stenosis at the level of the hypopharynx (series 2, image 59). By comparison laryngeal and subglottic tracheal patency is relatively normal. 3. Progressed tumor thrombus extending from the left IJ into the left innominate vein, but the left subclavian and innominate veins remain patent. Electronically Signed: By: Genevie Ann M.D. On: 11/11/2016 16:56    ASSESSMENT / PLAN:  83 year old female with tongue cancer with stridor who developed an infection making her not a radiation candidate.  Patient was accepted in [place] pending a bed.  Discussed with H/O MD.  Patient does not meet ICU criteria to be transferred to the [facility] service.  Having said that, I will defer to the patient's primary to transfer patient at her discretion for closer monitoring.  This really seems to be a service recovery issue as the patient and family are very unhappy with the level of care provided by everybody.  This discussed with Dr. Alvy Bimler who will transfer care to the hospitalist service.    Monitor airway closely.  DNR confirmed, family does not wish for emergent intubations.  If a tracheostomy can be placed in a controlled condition they they are ok with that and wish to be transferred to [facility] for that.  Replace K  Address hypercalcemia  PCCM will sign off, please call back if needed.  Rush Farmer, M.D. [facility] Pulmonary/Critical Care Medicine. Pager: [phone]. After hours pager: [phone].  11/12/2016, 3:01  PM

## 2016-11-13 ENCOUNTER — Ambulatory Visit: Payer: Medicare Other

## 2016-11-13 LAB — URINE CULTURE

## 2016-11-16 LAB — CULTURE, BLOOD (SINGLE)
Culture: NO GROWTH
Special Requests: ADEQUATE

## 2016-11-17 ENCOUNTER — Telehealth: Payer: Self-pay | Admitting: *Deleted

## 2016-11-17 NOTE — Telephone Encounter (Signed)
Oncology Nurse Navigator Documentation  Received multiple VMMs from patient's dtr, Seth Bake, providing updates for her mother. She reported:  L neck mass was drained of 250 cc fluid s/p her arrival to [facility] 5/25.  As a result, her breathing has improved, stridor now absent.  Trach and PEG placed 5/27.  She will receive RT as part of her treatment plan. She further expressed appreciation for care provided by Dr. Alvy Bimler in particular, other members of the Care Team.    [name], RN, BSN, [place] [facility] [place] [phone]

## 2016-11-20 ENCOUNTER — Ambulatory Visit: Payer: Medicare Other

## 2016-11-20 ENCOUNTER — Encounter: Payer: Medicare Other | Admitting: Nutrition

## 2016-11-23 ENCOUNTER — Telehealth: Payer: Self-pay | Admitting: *Deleted

## 2016-11-23 NOTE — Telephone Encounter (Signed)
Oncology Nurse Navigator Documentation  Received VMM from Ms. Quiggle's daughter, Seth Bake, returned her call.  She reported her mother begins RT tomorrow at [facility] where she is still IP.   Plan is for 2 days of BID tmt with repeat after 2 weeks, continue as long as benefit evident.  She noted tmt is strictly palliative with focus on quality of life.  She indicated her mother was alert and ambulatory last week but is more somnolent today.  Drs. Ann Lions provided update at Andrea's request.  [name], RN, BSN, [place] Oncology Nurse [place] [facility] [place] [phone]

## 2016-11-27 ENCOUNTER — Ambulatory Visit: Payer: Medicare Other

## 2016-12-04 ENCOUNTER — Ambulatory Visit: Payer: Medicare Other

## 2017-01-20 DEATH — deceased
